# Patient Record
Sex: Female | Born: 1955
Health system: Southern US, Community
[De-identification: ages and names within clinical notes are randomized; demographics above are authoritative.]

## PROBLEM LIST (undated history)

## (undated) DIAGNOSIS — E785 Hyperlipidemia, unspecified: Secondary | ICD-10-CM

## (undated) DIAGNOSIS — D649 Anemia, unspecified: Secondary | ICD-10-CM

## (undated) DIAGNOSIS — K219 Gastro-esophageal reflux disease without esophagitis: Secondary | ICD-10-CM

## (undated) DIAGNOSIS — D72819 Decreased white blood cell count, unspecified: Secondary | ICD-10-CM

## (undated) DIAGNOSIS — Z8719 Personal history of other diseases of the digestive system: Secondary | ICD-10-CM

## (undated) DIAGNOSIS — B029 Zoster without complications: Secondary | ICD-10-CM

## (undated) DIAGNOSIS — K589 Irritable bowel syndrome without diarrhea: Secondary | ICD-10-CM

## (undated) DIAGNOSIS — Z8711 Personal history of peptic ulcer disease: Secondary | ICD-10-CM

## (undated) DIAGNOSIS — T7840XA Allergy, unspecified, initial encounter: Secondary | ICD-10-CM

## (undated) DIAGNOSIS — I509 Heart failure, unspecified: Secondary | ICD-10-CM

## (undated) DIAGNOSIS — G500 Trigeminal neuralgia: Secondary | ICD-10-CM

## (undated) DIAGNOSIS — I10 Essential (primary) hypertension: Secondary | ICD-10-CM

## (undated) DIAGNOSIS — I2510 Atherosclerotic heart disease of native coronary artery without angina pectoris: Secondary | ICD-10-CM

## (undated) HISTORY — DX: Hyperlipidemia, unspecified: E78.5

## (undated) HISTORY — DX: Decreased white blood cell count, unspecified: D72.819

## (undated) HISTORY — DX: Heart failure, unspecified: I50.9

## (undated) HISTORY — DX: Essential (primary) hypertension: I10

## (undated) HISTORY — DX: Anemia, unspecified: D64.9

## (undated) HISTORY — DX: Gastro-esophageal reflux disease without esophagitis: K21.9

## (undated) HISTORY — DX: Allergy, unspecified, initial encounter: T78.40XA

## (undated) HISTORY — DX: Irritable bowel syndrome, unspecified: K58.9

## (undated) HISTORY — DX: Personal history of peptic ulcer disease: Z87.11

## (undated) HISTORY — DX: Personal history of other diseases of the digestive system: Z87.19

## (undated) HISTORY — PX: LUMBAR SPINE SURGERY: SHX701

## (undated) HISTORY — PX: TUBAL LIGATION: SHX77

## (undated) HISTORY — DX: Atherosclerotic heart disease of native coronary artery without angina pectoris: I25.10

## (undated) HISTORY — DX: Zoster without complications: B02.9

## (undated) HISTORY — DX: Trigeminal neuralgia: G50.0

## (undated) HISTORY — PX: TONSILLECTOMY: SUR1361

---

## 1998-11-02 ENCOUNTER — Other Ambulatory Visit: Admission: RE | Admit: 1998-11-02 | Discharge: 1998-11-02 | Payer: Self-pay | Admitting: Obstetrics and Gynecology

## 1999-01-19 ENCOUNTER — Ambulatory Visit (HOSPITAL_COMMUNITY): Admission: RE | Admit: 1999-01-19 | Discharge: 1999-01-19 | Payer: Self-pay | Admitting: Oncology

## 1999-01-19 ENCOUNTER — Encounter (INDEPENDENT_AMBULATORY_CARE_PROVIDER_SITE_OTHER): Payer: Self-pay | Admitting: *Deleted

## 1999-03-25 ENCOUNTER — Encounter: Admission: RE | Admit: 1999-03-25 | Discharge: 1999-03-25 | Payer: Self-pay | Admitting: Family Medicine

## 1999-03-25 ENCOUNTER — Encounter: Payer: Self-pay | Admitting: Family Medicine

## 1999-05-11 ENCOUNTER — Encounter: Payer: Self-pay | Admitting: Obstetrics and Gynecology

## 1999-05-11 ENCOUNTER — Ambulatory Visit (HOSPITAL_COMMUNITY): Admission: RE | Admit: 1999-05-11 | Discharge: 1999-05-11 | Payer: Self-pay | Admitting: Obstetrics and Gynecology

## 2000-01-11 HISTORY — PX: ABDOMINAL HYSTERECTOMY: SHX81

## 2000-01-11 HISTORY — PX: CORONARY ARTERY BYPASS GRAFT: SHX141

## 2000-02-17 ENCOUNTER — Other Ambulatory Visit: Admission: RE | Admit: 2000-02-17 | Discharge: 2000-02-17 | Payer: Self-pay | Admitting: Obstetrics and Gynecology

## 2000-02-22 ENCOUNTER — Encounter: Admission: RE | Admit: 2000-02-22 | Discharge: 2000-02-22 | Payer: Self-pay | Admitting: Obstetrics and Gynecology

## 2000-02-22 ENCOUNTER — Encounter: Payer: Self-pay | Admitting: Obstetrics and Gynecology

## 2000-05-08 ENCOUNTER — Encounter: Payer: Self-pay | Admitting: Obstetrics and Gynecology

## 2000-05-09 ENCOUNTER — Inpatient Hospital Stay (HOSPITAL_COMMUNITY): Admission: RE | Admit: 2000-05-09 | Discharge: 2000-05-10 | Payer: Self-pay | Admitting: Obstetrics and Gynecology

## 2000-05-09 ENCOUNTER — Encounter (INDEPENDENT_AMBULATORY_CARE_PROVIDER_SITE_OTHER): Payer: Self-pay

## 2000-08-21 ENCOUNTER — Encounter: Payer: Self-pay | Admitting: Family Medicine

## 2000-08-21 ENCOUNTER — Encounter: Admission: RE | Admit: 2000-08-21 | Discharge: 2000-08-21 | Payer: Self-pay | Admitting: Family Medicine

## 2000-11-20 ENCOUNTER — Emergency Department (HOSPITAL_COMMUNITY): Admission: EM | Admit: 2000-11-20 | Discharge: 2000-11-21 | Payer: Self-pay | Admitting: Emergency Medicine

## 2000-11-21 ENCOUNTER — Encounter: Payer: Self-pay | Admitting: Emergency Medicine

## 2000-11-29 ENCOUNTER — Encounter: Payer: Self-pay | Admitting: Emergency Medicine

## 2000-11-29 ENCOUNTER — Inpatient Hospital Stay (HOSPITAL_COMMUNITY): Admission: EM | Admit: 2000-11-29 | Discharge: 2000-12-06 | Payer: Self-pay | Admitting: Emergency Medicine

## 2000-12-01 ENCOUNTER — Encounter: Payer: Self-pay | Admitting: Thoracic Surgery (Cardiothoracic Vascular Surgery)

## 2000-12-02 ENCOUNTER — Encounter: Payer: Self-pay | Admitting: Surgery

## 2000-12-03 ENCOUNTER — Encounter: Payer: Self-pay | Admitting: Cardiothoracic Surgery

## 2000-12-04 ENCOUNTER — Encounter: Payer: Self-pay | Admitting: Cardiothoracic Surgery

## 2001-01-16 ENCOUNTER — Encounter (HOSPITAL_COMMUNITY): Admission: RE | Admit: 2001-01-16 | Discharge: 2001-02-05 | Payer: Self-pay | Admitting: Family Medicine

## 2001-02-12 ENCOUNTER — Inpatient Hospital Stay (HOSPITAL_COMMUNITY): Admission: EM | Admit: 2001-02-12 | Discharge: 2001-02-12 | Payer: Self-pay | Admitting: Emergency Medicine

## 2001-02-12 ENCOUNTER — Encounter: Payer: Self-pay | Admitting: Cardiology

## 2001-05-02 ENCOUNTER — Other Ambulatory Visit: Admission: RE | Admit: 2001-05-02 | Discharge: 2001-05-02 | Payer: Self-pay | Admitting: Obstetrics and Gynecology

## 2001-08-24 ENCOUNTER — Ambulatory Visit (HOSPITAL_COMMUNITY): Admission: RE | Admit: 2001-08-24 | Discharge: 2001-08-25 | Payer: Self-pay | Admitting: Cardiology

## 2002-05-16 ENCOUNTER — Encounter: Payer: Self-pay | Admitting: Family Medicine

## 2002-05-16 ENCOUNTER — Other Ambulatory Visit: Admission: RE | Admit: 2002-05-16 | Discharge: 2002-05-16 | Payer: Self-pay | Admitting: Obstetrics and Gynecology

## 2002-09-25 ENCOUNTER — Encounter: Admission: RE | Admit: 2002-09-25 | Discharge: 2002-09-25 | Payer: Self-pay | Admitting: Family Medicine

## 2002-09-25 ENCOUNTER — Encounter: Payer: Self-pay | Admitting: Family Medicine

## 2002-10-22 ENCOUNTER — Encounter: Admission: RE | Admit: 2002-10-22 | Discharge: 2002-10-22 | Payer: Self-pay | Admitting: Orthopedic Surgery

## 2002-10-22 ENCOUNTER — Encounter: Payer: Self-pay | Admitting: Orthopedic Surgery

## 2003-10-16 ENCOUNTER — Emergency Department (HOSPITAL_COMMUNITY): Admission: EM | Admit: 2003-10-16 | Discharge: 2003-10-16 | Payer: Self-pay | Admitting: *Deleted

## 2003-10-17 ENCOUNTER — Ambulatory Visit (HOSPITAL_COMMUNITY): Admission: RE | Admit: 2003-10-17 | Discharge: 2003-10-17 | Payer: Self-pay | Admitting: Gastroenterology

## 2003-12-10 ENCOUNTER — Encounter: Admission: RE | Admit: 2003-12-10 | Discharge: 2003-12-10 | Payer: Self-pay | Admitting: Gastroenterology

## 2004-11-18 ENCOUNTER — Encounter: Admission: RE | Admit: 2004-11-18 | Discharge: 2004-11-18 | Payer: Self-pay | Admitting: Obstetrics and Gynecology

## 2004-11-18 ENCOUNTER — Encounter: Payer: Self-pay | Admitting: Family Medicine

## 2004-11-18 LAB — HM DEXA SCAN

## 2005-05-20 ENCOUNTER — Ambulatory Visit (HOSPITAL_COMMUNITY): Admission: RE | Admit: 2005-05-20 | Discharge: 2005-05-20 | Payer: Self-pay | Admitting: Cardiology

## 2005-05-20 ENCOUNTER — Encounter: Payer: Self-pay | Admitting: Vascular Surgery

## 2005-07-07 ENCOUNTER — Encounter: Admission: RE | Admit: 2005-07-07 | Discharge: 2005-07-07 | Payer: Self-pay | Admitting: Endocrinology

## 2005-11-11 ENCOUNTER — Encounter: Admission: RE | Admit: 2005-11-11 | Discharge: 2005-11-11 | Payer: Self-pay | Admitting: Nephrology

## 2005-11-30 ENCOUNTER — Encounter: Payer: Self-pay | Admitting: Family Medicine

## 2005-11-30 ENCOUNTER — Ambulatory Visit (HOSPITAL_COMMUNITY): Admission: RE | Admit: 2005-11-30 | Discharge: 2005-11-30 | Payer: Self-pay | Admitting: Gastroenterology

## 2005-11-30 ENCOUNTER — Encounter (INDEPENDENT_AMBULATORY_CARE_PROVIDER_SITE_OTHER): Payer: Self-pay | Admitting: Specialist

## 2006-02-08 ENCOUNTER — Encounter: Payer: Self-pay | Admitting: Family Medicine

## 2006-06-02 ENCOUNTER — Encounter: Admission: RE | Admit: 2006-06-02 | Discharge: 2006-06-02 | Payer: Self-pay | Admitting: Family Medicine

## 2006-09-10 ENCOUNTER — Encounter: Payer: Self-pay | Admitting: Family Medicine

## 2006-09-10 ENCOUNTER — Encounter: Admission: RE | Admit: 2006-09-10 | Discharge: 2006-09-10 | Payer: Self-pay | Admitting: Family Medicine

## 2007-01-11 LAB — HM COLONOSCOPY

## 2007-02-06 ENCOUNTER — Encounter: Admission: RE | Admit: 2007-02-06 | Discharge: 2007-02-06 | Payer: Self-pay | Admitting: Family Medicine

## 2007-06-08 ENCOUNTER — Encounter: Admission: RE | Admit: 2007-06-08 | Discharge: 2007-06-08 | Payer: Self-pay | Admitting: Cardiology

## 2007-06-11 ENCOUNTER — Encounter: Admission: RE | Admit: 2007-06-11 | Discharge: 2007-06-11 | Payer: Self-pay | Admitting: Cardiology

## 2007-06-21 ENCOUNTER — Encounter: Payer: Self-pay | Admitting: Family Medicine

## 2007-07-06 ENCOUNTER — Ambulatory Visit (HOSPITAL_COMMUNITY): Admission: RE | Admit: 2007-07-06 | Discharge: 2007-07-06 | Payer: Self-pay | Admitting: Gastroenterology

## 2007-07-11 ENCOUNTER — Encounter: Admission: RE | Admit: 2007-07-11 | Discharge: 2007-07-11 | Payer: Self-pay | Admitting: Gastroenterology

## 2008-03-19 ENCOUNTER — Encounter: Payer: Self-pay | Admitting: Family Medicine

## 2008-06-19 ENCOUNTER — Encounter: Payer: Self-pay | Admitting: Family Medicine

## 2008-06-19 LAB — CONVERTED CEMR LAB
Basophils Absolute: 0 10*3/uL
Basophils Relative: 1 %
Eosinophils Absolute: 0 10*3/uL
Eosinophils Relative: 1 %
Ferritin: 29 ng/mL
HCT: 32.1 %
Hemoglobin: 10.7 g/dL
Iron: 84 ug/dL
Lymphocytes Relative: 63 %
Lymphs Abs: 2 10*3/uL
MCHC: 33.3 g/dL
MCV: 89 fL
Monocytes Absolute: 0.3 10*3/uL
Monocytes Relative: 10 %
Neutro Abs: 0.8 10*3/uL
Neutrophils Relative %: 25 %
Platelets: 201 10*3/uL
RBC: 3.59 M/uL
RDW: 14.5 %
Saturation Ratios: 20 %
TIBC: 412 ug/dL
UIBC: 328 ug/dL
WBC: 3.2 10*3/uL

## 2008-09-11 ENCOUNTER — Encounter: Payer: Self-pay | Admitting: Family Medicine

## 2008-09-11 HISTORY — PX: OTHER SURGICAL HISTORY: SHX169

## 2008-09-18 LAB — HM MAMMOGRAPHY

## 2008-09-24 ENCOUNTER — Encounter: Admission: RE | Admit: 2008-09-24 | Discharge: 2008-09-24 | Payer: Self-pay | Admitting: Obstetrics and Gynecology

## 2008-09-24 ENCOUNTER — Encounter: Payer: Self-pay | Admitting: Family Medicine

## 2008-12-19 ENCOUNTER — Encounter: Payer: Self-pay | Admitting: Family Medicine

## 2008-12-19 ENCOUNTER — Inpatient Hospital Stay (HOSPITAL_COMMUNITY): Admission: EM | Admit: 2008-12-19 | Discharge: 2008-12-21 | Payer: Self-pay | Admitting: Emergency Medicine

## 2009-03-13 ENCOUNTER — Encounter: Payer: Self-pay | Admitting: Family Medicine

## 2009-03-13 LAB — CONVERTED CEMR LAB
ALT: 23 units/L
AST: 28 units/L
Albumin: 4.7 g/dL
Alkaline Phosphatase: 90 units/L
BUN: 16 mg/dL
Basophils Absolute: 0 10*3/uL
Basophils Relative: 0 %
CO2: 26 meq/L
Calcium: 9.7 mg/dL
Chloride: 102 meq/L
Cholesterol: 139 mg/dL
Creatinine, Ser: 1.01 mg/dL
Eosinophils Absolute: 0 10*3/uL
Eosinophils Relative: 1 %
Glucose, Bld: 89 mg/dL
HCT: 31.4 %
HDL: 57 mg/dL
Hemoglobin: 10.4 g/dL
LDL Cholesterol: 67 mg/dL
Lymphocytes Relative: 44 %
Lymphs Abs: 1.1 10*3/uL
MCHC: 33.1 g/dL
MCV: 86 fL
Monocytes Absolute: 0.3 10*3/uL
Monocytes Relative: 11 %
Neutro Abs: 1.1 10*3/uL
Neutrophils Relative %: 44 %
Platelets: 165 10*3/uL
Potassium: 4.1 meq/L
RBC: 3.64 M/uL
RDW: 14.2 %
Sodium: 143 meq/L
TSH: 2.66 microintl units/mL
Total Bilirubin: 0.4 mg/dL
Total Protein: 6.9 g/dL
Triglycerides: 76 mg/dL
VLDL: 15 mg/dL
Vit D, 25-Hydroxy: 12.5 ng/mL
WBC: 2.5 10*3/uL

## 2009-05-04 ENCOUNTER — Encounter: Payer: Self-pay | Admitting: Family Medicine

## 2009-07-09 ENCOUNTER — Ambulatory Visit: Payer: Self-pay | Admitting: Oncology

## 2009-07-16 ENCOUNTER — Encounter: Payer: Self-pay | Admitting: Family Medicine

## 2009-09-28 ENCOUNTER — Encounter: Payer: Self-pay | Admitting: Family Medicine

## 2009-09-28 LAB — CONVERTED CEMR LAB

## 2009-09-29 ENCOUNTER — Encounter: Payer: Self-pay | Admitting: Family Medicine

## 2009-10-02 ENCOUNTER — Ambulatory Visit: Payer: Self-pay | Admitting: Oncology

## 2009-10-06 LAB — CBC WITH DIFFERENTIAL/PLATELET
BASO%: 0.9 % (ref 0.0–2.0)
Basophils Absolute: 0 10*3/uL (ref 0.0–0.1)
EOS%: 0.7 % (ref 0.0–7.0)
Eosinophils Absolute: 0 10*3/uL (ref 0.0–0.5)
HCT: 30.7 % — ABNORMAL LOW (ref 34.8–46.6)
HGB: 11 g/dL — ABNORMAL LOW (ref 11.6–15.9)
LYMPH%: 54.1 % — ABNORMAL HIGH (ref 14.0–49.7)
MCH: 31.8 pg (ref 25.1–34.0)
MCHC: 36 g/dL (ref 31.5–36.0)
MCV: 88.3 fL (ref 79.5–101.0)
MONO#: 0.2 10*3/uL (ref 0.1–0.9)
MONO%: 9.3 % (ref 0.0–14.0)
NEUT#: 0.9 10*3/uL — ABNORMAL LOW (ref 1.5–6.5)
NEUT%: 35 % — ABNORMAL LOW (ref 38.4–76.8)
Platelets: 175 10*3/uL (ref 145–400)
RBC: 3.48 10*6/uL — ABNORMAL LOW (ref 3.70–5.45)
RDW: 14 % (ref 11.2–14.5)
WBC: 2.6 10*3/uL — ABNORMAL LOW (ref 3.9–10.3)
lymph#: 1.4 10*3/uL (ref 0.9–3.3)

## 2009-10-06 LAB — MORPHOLOGY
PLT EST: ADEQUATE
RBC Comments: NORMAL

## 2009-10-06 LAB — CHCC SMEAR

## 2009-10-09 LAB — ANA: Anti Nuclear Antibody(ANA): NEGATIVE

## 2009-10-09 LAB — SEDIMENTATION RATE: Sed Rate: 18 mm/hr (ref 0–22)

## 2009-10-09 LAB — CMV IGM: CMV IgM: 0.59 (ref <0.90)

## 2009-10-09 LAB — CYTOMEGALOVIRUS ANTIBODY, IGG: Cytomegalovirus Ab-IgG: >6 — ABNORMAL HIGH (ref <0.90)

## 2009-11-20 ENCOUNTER — Encounter: Payer: Self-pay | Admitting: Family Medicine

## 2010-01-12 ENCOUNTER — Ambulatory Visit
Admission: RE | Admit: 2010-01-12 | Discharge: 2010-01-12 | Payer: Self-pay | Source: Home / Self Care | Attending: Family Medicine | Admitting: Family Medicine

## 2010-01-12 DIAGNOSIS — K589 Irritable bowel syndrome without diarrhea: Secondary | ICD-10-CM | POA: Insufficient documentation

## 2010-01-12 DIAGNOSIS — I2510 Atherosclerotic heart disease of native coronary artery without angina pectoris: Secondary | ICD-10-CM | POA: Insufficient documentation

## 2010-01-12 DIAGNOSIS — E785 Hyperlipidemia, unspecified: Secondary | ICD-10-CM | POA: Insufficient documentation

## 2010-01-13 DIAGNOSIS — G43909 Migraine, unspecified, not intractable, without status migrainosus: Secondary | ICD-10-CM | POA: Insufficient documentation

## 2010-01-13 DIAGNOSIS — I119 Hypertensive heart disease without heart failure: Secondary | ICD-10-CM | POA: Insufficient documentation

## 2010-01-20 ENCOUNTER — Encounter: Payer: Self-pay | Admitting: Family Medicine

## 2010-01-21 ENCOUNTER — Encounter: Payer: Self-pay | Admitting: Family Medicine

## 2010-01-27 ENCOUNTER — Telehealth: Payer: Self-pay | Admitting: Family Medicine

## 2010-01-27 ENCOUNTER — Encounter: Payer: Self-pay | Admitting: Family Medicine

## 2010-01-31 ENCOUNTER — Encounter: Payer: Self-pay | Admitting: Family Medicine

## 2010-02-07 DIAGNOSIS — M810 Age-related osteoporosis without current pathological fracture: Secondary | ICD-10-CM | POA: Insufficient documentation

## 2010-02-11 NOTE — Letter (Signed)
Summary: Family Medicine at [facility] Medicine at [facility]   Imported By: Maryln Gottron 02/02/2010 10:03:21  _____________________________________________________________________  External Attachment:    Type:   Image     Comment:   External Document

## 2010-02-11 NOTE — Miscellaneous (Signed)
Clinical Lists Changes  Observations: Added new observation of PAST MED HX: Current Problems:  MIGRAINE UNSP W/O INTRACT W/O STATUS MIGRAINOSUS (ICD-346.90) IBS (ICD-564.1) HYPERTENSION (ICD-401.9) HYPERLIPIDEMIA (ICD-272.4) C A D (ICD-414.00)- Dr. Donnie Aho with cards 09/11/2008  Probably normal stress Cardiolite with breast attenuation noted but no  evidence for ischemia 11/18/2004   Bone Density  T-score Lumbar Spine (-3.4) T-Score Left Hip (-1.8) (01/27/2010 12:09) Added new observation of VIT D 25-OH: 12.5 ng/mL (03/13/2009 12:09) Added new observation of ABSOLUTE BAS: 0.0 K/uL (03/13/2009 12:09) Added new observation of BASOPHIL %: 0 % (03/13/2009 12:09) Added new observation of EOS ABSLT: 0.0 K/uL (03/13/2009 12:09) Added new observation of % EOS AUTO: 1 % (03/13/2009 12:09) Added new observation of ABSOLUTE MON: 0.3 K/uL (03/13/2009 12:09) Added new observation of MONOCYTE %: 11 % (03/13/2009 12:09) Added new observation of ABS LYMPHOCY: 1.1 K/uL (03/13/2009 12:09) Added new observation of LYMPHS %: 44 % (03/13/2009 12:09) Added new observation of ABS NEUTROPH: 1.1 K/uL (03/13/2009 12:09) Added new observation of PMN %: 44 % (03/13/2009 12:09) Added new observation of PLATELETK/UL: 165 K/uL (03/13/2009 12:09) Added new observation of RDW: 14.2 % (03/13/2009 12:09) Added new observation of MCHC RBC: 33.1 g/dL (16/10/9602 54:09) Added new observation of MCV: 86 fL (03/13/2009 12:09) Added new observation of HCT: 31.4 % (03/13/2009 12:09) Added new observation of HGB: 10.4 g/dL (81/19/1478 29:56) Added new observation of RBC M/UL: 3.64 M/uL (03/13/2009 12:09) Added new observation of WBC COUNT: 2.5 10*3/microliter (03/13/2009 12:09) Added new observation of TSH: 2.660 microintl units/mL (03/13/2009 12:09) Added new observation of CALCIUM: 9.7 mg/dL (21/30/8657 84:69) Added new observation of ALBUMIN: 4.7 g/dL (62/95/2841 32:44) Added new observation of PROTEIN, TOT: 6.9 g/dL  (01/12/7251 66:44) Added new observation of SGPT (ALT): 23 units/L (03/13/2009 12:09) Added new observation of SGOT (AST): 28 units/L (03/13/2009 12:09) Added new observation of ALK PHOS: 90 units/L (03/13/2009 12:09) Added new observation of BILI TOTAL: 0.4 mg/dL (03/47/4259 56:38) Added new observation of CREATININE: 1.01 mg/dL (75/64/3329 51:88) Added new observation of BUN: 16 mg/dL (41/66/0630 16:01) Added new observation of BG RANDOM: 89 mg/dL (09/32/3557 32:20) Added new observation of CO2 PLSM/SER: 26 meq/L (03/13/2009 12:09) Added new observation of CL SERUM: 102 meq/L (03/13/2009 12:09) Added new observation of K SERUM: 4.1 meq/L (03/13/2009 12:09) Added new observation of NA: 143 meq/L (03/13/2009 12:09) Added new observation of VLDL: 15 mg/dL (25/42/7062 37:62) Added new observation of LDL: 67 mg/dL (83/15/1761 60:73) Added new observation of HDL: 57 mg/dL (71/06/2692 85:46) Added new observation of TRIGLYC TOT: 76 mg/dL (27/03/5007 38:18) Added new observation of CHOLESTEROL: 139 mg/dL (29/93/7169 67:89) Added new observation of ABSOLUTE BAS: 0.0 K/uL (06/19/2008 12:09) Added new observation of BASOPHIL %: 1 % (06/19/2008 12:09) Added new observation of EOS ABSLT: 0.0 K/uL (06/19/2008 12:09) Added new observation of % EOS AUTO: 1 % (06/19/2008 12:09) Added new observation of ABSOLUTE MON: 0.3 K/uL (06/19/2008 12:09) Added new observation of MONOCYTE %: 10 % (06/19/2008 12:09) Added new observation of ABS LYMPHOCY: 2.0 K/uL (06/19/2008 12:09) Added new observation of LYMPHS %: 63 % (06/19/2008 12:09) Added new observation of ABS NEUTROPH: 0.8 K/uL (06/19/2008 12:09) Added new observation of PMN %: 25 % (06/19/2008 12:09) Added new observation of PLATELETK/UL: 201 K/uL (06/19/2008 12:09) Added new observation of RDW: 14.5 % (06/19/2008 12:09) Added new observation of MCHC RBC: 33.3 g/dL (38/10/1749 02:58) Added new observation of MCV: 89 fL (06/19/2008 12:09) Added new  observation of HCT: 32.1 % (06/19/2008 12:09) Added new observation of  HGB: 10.7 g/dL (16/10/9602 54:09) Added new observation of RBC M/UL: 3.59 M/uL (06/19/2008 12:09) Added new observation of WBC COUNT: 3.2 10*3/microliter (06/19/2008 12:09) Added new observation of FERRITIN: 29 ng/mL (06/19/2008 12:09) Added new observation of IRON SATUR %: 20 % (06/19/2008 12:09) Added new observation of TIBC: 412 mcg/dL (81/19/1478 29:56) Added new observation of UIBC: 328 mcg/dL (21/30/8657 84:69) Added new observation of IRON: 84 mcg/dL (62/95/2841 32:44)      Past History:  Past Medical History: Current Problems:  MIGRAINE UNSP W/O INTRACT W/O STATUS MIGRAINOSUS (ICD-346.90) IBS (ICD-564.1) HYPERTENSION (ICD-401.9) HYPERLIPIDEMIA (ICD-272.4) C A D (ICD-414.00)- Dr. Donnie Aho with cards 09/11/2008  Probably normal stress Cardiolite with breast attenuation noted but no  evidence for ischemia 11/18/2004   Bone Density  T-score Lumbar Spine (-3.4) T-Score Left Hip (-1.8)

## 2010-02-11 NOTE — Miscellaneous (Signed)
  Clinical Lists Changes  Allergies: Added new allergy or adverse reaction of BONIVA Added new allergy or adverse reaction of * RECLAST Added new allergy or adverse reaction of SULFA Observations: Added new observation of ALLERGY REV: Done (01/21/2010 0:01)       Allergies: 1)  ! Augmentin 2)  ! Boniva 3)  ! Sulfa 4)  * Reclast

## 2010-02-11 NOTE — Letter (Signed)
Summary: Family Medicine at [facility] Medicine at [facility]   Imported By: Maryln Gottron 02/02/2010 10:01:35  _____________________________________________________________________  External Attachment:    Type:   Image     Comment:   External Document

## 2010-02-11 NOTE — Letter (Signed)
Summary: Viann Fish MD Cardiology  Viann Fish MD Cardiology   Imported By: Lanelle Bal 01/20/2010 12:03:31  _____________________________________________________________________  External Attachment:    Type:   Image     Comment:   External Document

## 2010-02-11 NOTE — Miscellaneous (Signed)
  Clinical Lists Changes  Observations: Added new observation of PAST MED HX: Current Problems:  MIGRAINE UNSP W/O INTRACT W/O STATUS MIGRAINOSUS (ICD-346.90) IBS (ICD-564.1) HYPERTENSION (ICD-401.9) HYPERLIPIDEMIA (ICD-272.4) C A D (ICD-414.00)- Dr. Donnie Aho with cards 09/11/2008  Probably normal stress Cardiolite with breast attenuation noted but no  evidence for ischemia (01/20/2010 11:23)      Past History:  Past Medical History: Current Problems:  MIGRAINE UNSP W/O INTRACT W/O STATUS MIGRAINOSUS (ICD-346.90) IBS (ICD-564.1) HYPERTENSION (ICD-401.9) HYPERLIPIDEMIA (ICD-272.4) C A D (ICD-414.00)- Dr. Donnie Aho with cards 09/11/2008  Probably normal stress Cardiolite with breast attenuation noted but no  evidence for ischemia

## 2010-02-11 NOTE — Letter (Signed)
Summary: Family Medicine at [facility] Medicine at [facility]   Imported By: Maryln Gottron 02/02/2010 10:08:16  _____________________________________________________________________  External Attachment:    Type:   Image     Comment:   External Document

## 2010-02-11 NOTE — Miscellaneous (Signed)
  Clinical Lists Changes  Problems: Added new problem of FAMILY HISTORY DIABETES 1ST DEGREE RELATIVE (ICD-V18.0) Observations: Added new observation of LAST MP: 2002  (01/20/2010 9:22) Added new observation of REGULAREXERC: yes  (01/20/2010 9:22) Added new observation of DRUG USE: no  (01/20/2010 9:22) Added new observation of SOCIAL HX: From [place] 1 son divorced works for [employer]/eligibility and part time at [employer] Education:  14 years no tob no alcohol enjoys time at [employer] and jazz music Drug use-no Regular exercise-yes   (01/20/2010 9:22) Added new observation of FAMILY HX: M alive with arthritis F dead with MI, CVA at 79 Family History Diabetes 1st degree relative, parents Family History Hypertension, parents Family History of Stroke F 1st degree relative <60, parents Family History of Heart Disease, parents  (01/20/2010 9:22) Added new observation of FH STROKE: Family History of Stroke F 1st degree relative <60  (01/20/2010 9:22) Added new observation of FH HTN: Family History Hypertension  (01/20/2010 9:22) Added new observation of FH DIABETES: Family History Diabetes 1st degree relative  (01/20/2010 9:22) Added new observation of FLU VAX: given  (10/10/2009 9:28) Added new observation of PAP SMEAR: Done  (09/28/2009 9:30) Added new observation of MAMMOGRAM: Done  (09/18/2008 9:28) Added new observation of TD BOOSTER: Td  (01/11/2008 9:28) Added new observation of COLONOSCOPY: Done  (01/11/2007 9:28)       Family History: M alive with arthritis F dead with MI, CVA at 58 Family History Diabetes 1st degree relative, parents Family History Hypertension, parents Family History of Stroke F 1st degree relative <60, parents Family History of Heart Disease, parents  Social History: From [place] 1 son divorced works for [employer]/eligibility and part time at [employer] Education:  14 years no tob no alcohol enjoys time at [employer] and jazz  music Drug use-no Regular exercise-yes Drug Use:  no Does Patient Exercise:  yes    Preventive Care Screening  Last Flu Shot:    Date:  10/10/2009    Results:  given   Pap Smear:    Date:  09/28/2009    Results:  Done   Mammogram:    Date:  09/18/2008    Results:  Done   Last Tetanus Booster:    Date:  01/11/2008    Results:  Td   Colonoscopy:    Date:  01/11/2007    Results:  Done     Vital Signs:  Patient Profile:   55 Years Old Female LMP:     2002  Menstrual History: LMP (date): 2002             Last PAP Date 09/28/2009

## 2010-02-11 NOTE — Assessment & Plan Note (Signed)
Summary: NEW PT TO EST/CLE   Vital Signs:  Patient profile:   55 year old female Height:      62.5 inches Weight:      133.75 pounds BMI:     24.16 Temp:     98 degrees F oral Pulse rate:   84 / minute Pulse rhythm:   regular BP sitting:   132 / 90  (left arm) Cuff size:   regular  Vitals Entered By: Delilah Shan CMA Duncan Dull) (January 12, 2010 10:17 AM) CC: New Patient to Establish   History of Present Illness: New patient.  Feeling well w/o complaints.    Hypertension:      Using medication without problems or lightheadedness: yes Chest pain with exertion:no Edema:no Short of breath:no Other issues:no  Elevated Cholesterol: Using medications without problems:yes Muscle aches: no Other complaints: no  CAD s/p CABG w/o MI.  followed by cards with good exertional capacity per patient and  no CP.  Requesting records.    Allergies (verified): 1)  ! Augmentin  Past History:  Family History: Last updated: 01/12/2010 M alive with arthritis F dead with MI, CVA at 35  Social History: Last updated: 01/12/2010 From [place] 1 son divorced works for [employer]/eligibility and part time at [employer] no tob no alcohol enjoys time at [employer] and jazz music  Past Medical History: Current Problems:  MIGRAINE UNSP W/O INTRACT W/O STATUS MIGRAINOSUS (ICD-346.90) IBS (ICD-564.1) HYPERTENSION (ICD-401.9) HYPERLIPIDEMIA (ICD-272.4) C A D (ICD-414.00)- Dr. Donnie Aho with cards  Past Surgical History: Coronary artery bypass graft 2002 Hysterectomy 2002 Tonsillectomy  ~1965  Family History: M alive with arthritis F dead with MI, CVA at 49  Social History: From [place] 1 son divorced works for [employer]/eligibility and part time at [employer] no tob no alcohol enjoys time at [employer] and jazz music  Review of Systems       See HPI.  Otherwise negative.    Physical Exam  General:  GEN: nad, alert and oriented HEENT: mucous membranes moist NECK:  supple w/o LA CV: rrr PULM: ctab, no inc wob ABD: soft, +bs EXT: no edema SKIN: no acute rash    Impression & Recommendations:  Problem # 1:  HYPERTENSION, BENIGN ESSENTIAL (ICD-401.1) Controlled, requesting records.  Her updated medication list for this problem includes:    Diovan Hct 160-12.5 Mg Tabs (Valsartan-hydrochlorothiazide) .Marland Kitchen... Take 1 tablet by mouth once a day    Metoprolol Tartrate 50 Mg Tabs (Metoprolol tartrate) .Marland Kitchen... Take 1 tablet by mouth two times a day    Caduet 5-80 Mg Tabs (Amlodipine-atorvastatin) .Marland Kitchen... Take 1 tablet by mouth once a day  Problem # 2:  HYPERLIPIDEMIA (ICD-272.4) No change in meds, requesting records.  Her updated medication list for this problem includes:    Zetia 10 Mg Tabs (Ezetimibe) .Marland Kitchen... Take 1 tablet by mouth once a day    Caduet 5-80 Mg Tabs (Amlodipine-atorvastatin) .Marland Kitchen... Take 1 tablet by mouth once a day  Problem # 3:  C A D (ICD-414.00) No CP, requesting records.  Her updated medication list for this problem includes:    Diovan Hct 160-12.5 Mg Tabs (Valsartan-hydrochlorothiazide) .Marland Kitchen... Take 1 tablet by mouth once a day    Aspirin 81 Mg Tabs (Aspirin) .Marland Kitchen... Take 1 tablet by mouth once a day    Metoprolol Tartrate 50 Mg Tabs (Metoprolol tartrate) .Marland Kitchen... Take 1 tablet by mouth two times a day    Caduet 5-80 Mg Tabs (Amlodipine-atorvastatin) .Marland Kitchen... Take 1 tablet  by mouth once a day  Complete Medication List: 1)  Diovan Hct 160-12.5 Mg Tabs (Valsartan-hydrochlorothiazide) .... Take 1 tablet by mouth once a day 2)  Aspirin 81 Mg Tabs (Aspirin) .... Take 1 tablet by mouth once a day 3)  Metoprolol Tartrate 50 Mg Tabs (Metoprolol tartrate) .... Take 1 tablet by mouth two times a day 4)  Aciphex 20 Mg Tbec (Rabeprazole sodium) .... Take 1 tablet by mouth two times a day 5)  Dicyclomine Hcl 10 Mg Caps (Dicyclomine hcl) .... Take 1 tablet by mouth once a day 6)  Xyzal 5 Mg Tabs (Levocetirizine dihydrochloride) .... Take 1 tablet by mouth once a  day 7)  Zetia 10 Mg Tabs (Ezetimibe) .... Take 1 tablet by mouth once a day 8)  Caduet 5-80 Mg Tabs (Amlodipine-atorvastatin) .... Take 1 tablet by mouth once a day  Patient Instructions: 1)  We'll request your records.  Let me know if you have questions or conerns.  I'm glad to see you today.   2)  I would get a physical in the fall (October 2012).  Let me know if you want to do your labs here or with Donnie Aho.   3)  Take care.    Orders Added: 1)  New Patient Level III [16109]    Current Allergies (reviewed today): ! AUGMENTIN

## 2010-02-11 NOTE — Letter (Signed)
Summary: Family Medicine at [facility] Medicine at [facility]   Imported By: Maryln Gottron 02/02/2010 10:04:47  _____________________________________________________________________  External Attachment:    Type:   Image     Comment:   External Document

## 2010-02-17 NOTE — Progress Notes (Signed)
Summary: Bone Density Results/Evista  Phone Note Outgoing Call   Caller: Patient Call For: Crawford Givens MD Call placed by: Delilah Shan CMA Jermani Pund Dull),  January 27, 2010 12:26 PM Call placed to: Patient Summary of Call: I spoke to the patient about getting her last bone density test results and information concerning her Evista.  She says that Huel Cote (MD/GYN) should have these records and she will ask them to send them to [place].  Have you received them yet? Initial call taken by: Delilah Shan CMA Nathalya Wolanski Dull),  January 22, 2010 12:27 PM  Follow-up for Phone Call        Have you received these results yet?  Lugene Fuquay CMA Addalyne Vandehei Dull)  January 27, 2010 12:28 PM   not yet. Crawford Givens MD  January 27, 2010 12:32 PM  Patient reminded, she called Olegario Messier Richardson's office and they will fax over bone density test today. She says it was in 2010.  Lugene Fuquay CMA Angelino Rumery Dull)  February 02, 2010 12:07 PM    Patient says she has not ever been on Evista.  She was on Boniva a long time ago and it didn't suit her well.  Have you received the bone density results yet? Lugene Fuquay CMA Donalee Gaumond Dull)  February 04, 2010 4:19 PM   Additional Follow-up for Phone Call Additional follow up Details #1::        Not yet. Crawford Givens MD  February 04, 2010 6:22 PM.  Report in your in box.  Lugene Fuquay CMA Shmuel Girgis Dull)  February 05, 2010 3:33 PM    I will review. Crawford Givens MD  February 05, 2010 4:22 PM      Appended Document: Bone Density Results/Evista    Clinical Lists Changes  Problems: Added new problem of OSTEOPOROSIS (ICD-733.00) Observations: Added new observation of BONE DENSITY: T score -2.5 (09/24/2008 11:12)         Bone Density  Procedure date:  09/24/2008  Findings:      T score -2.5  Appended Document: Bone Density Results/Evista I called patient.  She isn't on evista and is intolerant of mult meds.  She has osteoporosis and is at increased risk if fx- she is aware of this.  At this  point, she is going to take tums since she can tolerate that.  She'll gradually increase her dose of vitamin D to limit side effects (hopefully) and we can recheck a DXA 10/12 since that will be 2 years since last scan.  We can talk about options at that point.  She agrees.   Clinical Lists Changes  Allergies: Added new allergy or adverse reaction of EVISTA Observations: Added new observation of ALLERGY REV: Done (02/08/2010 8:37)        Allergies: 1)  ! Augmentin 2)  ! Boniva 3)  ! Sulfa 4)  ! Evista 5)  * Reclast

## 2010-02-26 ENCOUNTER — Ambulatory Visit (INDEPENDENT_AMBULATORY_CARE_PROVIDER_SITE_OTHER): Payer: 59 | Admitting: Family Medicine

## 2010-02-26 ENCOUNTER — Encounter: Payer: Self-pay | Admitting: Family Medicine

## 2010-02-26 DIAGNOSIS — J019 Acute sinusitis, unspecified: Secondary | ICD-10-CM | POA: Insufficient documentation

## 2010-03-03 NOTE — Assessment & Plan Note (Signed)
Summary: HA,EAR IS ITCHING/CLE   [facility]   Vital Signs:  Patient profile:   55 year old female Height:      62.5 inches Weight:      136.25 pounds BMI:     24.61 Temp:     98.5 degrees F oral Pulse rate:   68 / minute Pulse rhythm:   regular BP sitting:   140 / 86  (left arm) Cuff size:   regular  Vitals Entered By: Delilah Shan CMA Manford Sprong Dull) (February 26, 2010 12:27 PM) CC: H/A, ear is itching   History of Present Illness: "I thought it was my sinuses with facial pressure initially."  L ear is irriated.  Now with popping in bilateral ears.  Using nasal saline two times a day, w/o relief.  No help with mucinex-this only keep her from sleeping. No help with other otc meds. Sx started about 2 weeks ago.  No fevers.    H/o osteoporosis.  She was intolerant of oral meds and not able to take enought calcium and vit d to use other meds- ie IV bisphosphonates.  we talked about this today.   Allergies: 1)  ! Augmentin 2)  ! Boniva 3)  ! Sulfa 4)  ! Evista 5)  * Reclast  Past History:  Past Medical History: Current Problems:  MIGRAINE UNSP W/O INTRACT W/O STATUS MIGRAINOSUS (ICD-346.90) IBS (ICD-564.1) HYPERTENSION (ICD-401.9) HYPERLIPIDEMIA (ICD-272.4) C A D (ICD-414.00)- Dr. Donnie Aho with cards 09/11/2008  Probably normal stress Cardiolite with breast attenuation noted but no  evidence for ischemia Osteoporosis 11/18/2004   Bone Density  T-score Lumbar Spine (-3.4) T-Score Left Hip (-1.8)- unable to tolerate meds for tx  Review of Systems       See HPI.  Otherwise negative.    Physical Exam  General:  GEN: nad, alert and oriented HEENT: mucous membranes moist, TM w/o erythema, L>R SOM  noted, nasal epithelium injected, OP with cobblestoning NECK: supple w/o LA CV: rrr. PULM: ctab, no inc wob EXT: no edema  max sinuses slightly tender to palpation    Impression & Recommendations:  Problem # 1:  SINUSITIS - ACUTE-NOS (ICD-461.9) with likely ETD dysfunction bilaterally.  Will  tx given symptoms, duration and SOM.  Start zmax, continue sinus rinse, and add on flonase . follow up as needed.  she agrees.   Her updated medication list for this problem includes:    Fluticasone Propionate 50 Mcg/act Susp (Fluticasone propionate) .Marland Kitchen... 2 sprays per nostril per day    Zithromax 250 Mg Tabs (Azithromycin) .Marland Kitchen... 2 by mouth today and then 1 by mouth  for four days  Orders: Prescription Created Electronically [phone])  Problem # 2:  OSTEOPOROSIS (ICD-733.00) she'll try to gradually increase her vit D/calcium intake .  Complete Medication List: 1)  Diovan Hct 160-12.5 Mg Tabs (Valsartan-hydrochlorothiazide) .... Take 1 tablet by mouth once a day 2)  Aspirin 81 Mg Tabs (Aspirin) .... Take 1 tablet by mouth once a day 3)  Metoprolol Tartrate 50 Mg Tabs (Metoprolol tartrate) .... Take 1 tablet by mouth two times a day 4)  Dicyclomine Hcl 10 Mg Caps (Dicyclomine hcl) .... Take 1 tablet by mouth once a day 5)  Xyzal 5 Mg Tabs (Levocetirizine dihydrochloride) .... Take 1 tablet by mouth once a day 6)  Zetia 10 Mg Tabs (Ezetimibe) .... Take 1 tablet by mouth once a day 7)  Caduet 5-80 Mg Tabs (Amlodipine-atorvastatin) .... Take 1 tablet by mouth once a day 8)  Protonix 20 Mg Tbec (  Pantoprazole sodium) .... ? mg.  take 1 tablet by mouth two times a day 9)  Fluticasone Propionate 50 Mcg/act Susp (Fluticasone propionate) .... 2 sprays per nostril per day 10)  Zithromax 250 Mg Tabs (Azithromycin) .... 2 by mouth today and then 1 by mouth  for four days  Patient Instructions: 1)  Start the antibiotics today and use the nose spray after the nasal rinse in the morning.  2)  Get plenty of rest, drink lots of clear liquids, and use Tylenol or Ibuprofen for fever and comfort. You can use a hot cloth on your face for pain.   3)  This should gradually get better.  Prescriptions: ZITHROMAX 250 MG TABS (AZITHROMYCIN) 2 by mouth today and then 1 by mouth  for four days  #6 x 0   Entered and  Authorized by:   Crawford Givens MD   Signed by:   Crawford Givens MD on 02/26/2010   Method used:   Electronically to        [facility] [address] [id number]* (retail)       [address] [place], [place]  [address]       Ph: [phone]       Fax: [phone]   RxID:   [id number] FLUTICASONE PROPIONATE 50 MCG/ACT SUSP (FLUTICASONE PROPIONATE) 2 sprays per nostril per day  #1 x 1   Entered and Authorized by:   Crawford Givens MD   Signed by:   Crawford Givens MD on 02/26/2010   Method used:   Electronically to        [facility] [address] [id number]* (retail)       [address] [place], [place]  [address]       Ph: [phone]       Fax: [phone]   RxID:   [id number]    Orders Added: 1)  Prescription Created Electronically [[id number]] 2)  Est. Patient Level III [71062]    Current Allergies (reviewed today): ! AUGMENTIN ! BONIVA ! SULFA ! EVISTA * RECLAST

## 2010-04-13 LAB — CK TOTAL AND CKMB (NOT AT ARMC)
CK, MB: 1.3 ng/mL (ref 0.3–4.0)
Relative Index: 0.8 (ref 0.0–2.5)
Total CK: 172 U/L (ref 7–177)

## 2010-04-13 LAB — CBC
HCT: 29.2 % — ABNORMAL LOW (ref 36.0–46.0)
HCT: 35.2 % — ABNORMAL LOW (ref 36.0–46.0)
Hemoglobin: 10.2 g/dL — ABNORMAL LOW (ref 12.0–15.0)
Hemoglobin: 12 g/dL (ref 12.0–15.0)
MCHC: 34.2 g/dL (ref 30.0–36.0)
MCHC: 34.8 g/dL (ref 30.0–36.0)
MCV: 90.1 fL (ref 78.0–100.0)
MCV: 91.6 fL (ref 78.0–100.0)
Platelets: 121 10*3/uL — ABNORMAL LOW (ref 150–400)
Platelets: 167 10*3/uL (ref 150–400)
RBC: 3.24 MIL/uL — ABNORMAL LOW (ref 3.87–5.11)
RBC: 3.84 MIL/uL — ABNORMAL LOW (ref 3.87–5.11)
RDW: 13.8 % (ref 11.5–15.5)
RDW: 14.3 % (ref 11.5–15.5)
WBC: 3.2 10*3/uL — ABNORMAL LOW (ref 4.0–10.5)
WBC: 6.6 10*3/uL (ref 4.0–10.5)

## 2010-04-13 LAB — URINALYSIS, ROUTINE W REFLEX MICROSCOPIC
Bilirubin Urine: NEGATIVE
Glucose, UA: NEGATIVE mg/dL
Hgb urine dipstick: NEGATIVE
Ketones, ur: NEGATIVE mg/dL
Nitrite: NEGATIVE
Protein, ur: NEGATIVE mg/dL
Specific Gravity, Urine: 1.03 (ref 1.005–1.030)
Urobilinogen, UA: 0.2 mg/dL (ref 0.0–1.0)
pH: 5 (ref 5.0–8.0)

## 2010-04-13 LAB — HEPATIC FUNCTION PANEL
ALT: 30 U/L (ref 0–35)
AST: 35 U/L (ref 0–37)
Albumin: 3.7 g/dL (ref 3.5–5.2)
Alkaline Phosphatase: 59 U/L (ref 39–117)
Bilirubin, Direct: 0.2 mg/dL (ref 0.0–0.3)
Indirect Bilirubin: 0.9 mg/dL (ref 0.3–0.9)
Total Bilirubin: 1.1 mg/dL (ref 0.3–1.2)
Total Protein: 6.1 g/dL (ref 6.0–8.3)

## 2010-04-13 LAB — DIFFERENTIAL
Basophils Absolute: 0 10*3/uL (ref 0.0–0.1)
Basophils Absolute: 0 10*3/uL (ref 0.0–0.1)
Basophils Relative: 0 % (ref 0–1)
Basophils Relative: 0 % (ref 0–1)
Eosinophils Absolute: 0 10*3/uL (ref 0.0–0.7)
Eosinophils Absolute: 0 10*3/uL (ref 0.0–0.7)
Eosinophils Relative: 0 % (ref 0–5)
Eosinophils Relative: 0 % (ref 0–5)
Lymphocytes Relative: 6 % — ABNORMAL LOW (ref 12–46)
Lymphocytes Relative: 9 % — ABNORMAL LOW (ref 12–46)
Lymphs Abs: 0.2 10*3/uL — ABNORMAL LOW (ref 0.7–4.0)
Lymphs Abs: 0.6 10*3/uL — ABNORMAL LOW (ref 0.7–4.0)
Monocytes Absolute: 0.1 10*3/uL (ref 0.1–1.0)
Monocytes Absolute: 0.3 10*3/uL (ref 0.1–1.0)
Monocytes Relative: 2 % — ABNORMAL LOW (ref 3–12)
Monocytes Relative: 5 % (ref 3–12)
Neutro Abs: 2.9 10*3/uL (ref 1.7–7.7)
Neutro Abs: 5.6 10*3/uL (ref 1.7–7.7)
Neutrophils Relative %: 86 % — ABNORMAL HIGH (ref 43–77)
Neutrophils Relative %: 92 % — ABNORMAL HIGH (ref 43–77)

## 2010-04-13 LAB — URINE CULTURE
Colony Count: 85000
Special Requests: NEGATIVE

## 2010-04-13 LAB — COMPREHENSIVE METABOLIC PANEL
ALT: 29 U/L (ref 0–35)
ALT: 37 U/L — ABNORMAL HIGH (ref 0–35)
AST: 43 U/L — ABNORMAL HIGH (ref 0–37)
AST: 43 U/L — ABNORMAL HIGH (ref 0–37)
Albumin: 3.3 g/dL — ABNORMAL LOW (ref 3.5–5.2)
Albumin: 4.6 g/dL (ref 3.5–5.2)
Alkaline Phosphatase: 62 U/L (ref 39–117)
Alkaline Phosphatase: 87 U/L (ref 39–117)
BUN: 11 mg/dL (ref 6–23)
BUN: 19 mg/dL (ref 6–23)
CO2: 21 mEq/L (ref 19–32)
CO2: 29 mEq/L (ref 19–32)
Calcium: 8.1 mg/dL — ABNORMAL LOW (ref 8.4–10.5)
Calcium: 9.9 mg/dL (ref 8.4–10.5)
Chloride: 103 mEq/L (ref 96–112)
Chloride: 108 mEq/L (ref 96–112)
Creatinine, Ser: 0.95 mg/dL (ref 0.4–1.2)
Creatinine, Ser: 1.04 mg/dL (ref 0.4–1.2)
GFR calc Af Amer: >60 mL/min (ref >60)
GFR calc Af Amer: >60 mL/min (ref >60)
GFR calc non Af Amer: 55 mL/min — ABNORMAL LOW (ref >60)
GFR calc non Af Amer: >60 mL/min (ref >60)
Glucose, Bld: 116 mg/dL — ABNORMAL HIGH (ref 70–99)
Glucose, Bld: 171 mg/dL — ABNORMAL HIGH (ref 70–99)
Potassium: 3.5 mEq/L (ref 3.5–5.1)
Potassium: 3.7 mEq/L (ref 3.5–5.1)
Sodium: 137 mEq/L (ref 135–145)
Sodium: 140 mEq/L (ref 135–145)
Total Bilirubin: 0.7 mg/dL (ref 0.3–1.2)
Total Bilirubin: 0.8 mg/dL (ref 0.3–1.2)
Total Protein: 6.2 g/dL (ref 6.0–8.3)
Total Protein: 7.7 g/dL (ref 6.0–8.3)

## 2010-04-13 LAB — POCT CARDIAC MARKERS
CKMB, poc: 1.5 ng/mL (ref 1.0–8.0)
CKMB, poc: <1 ng/mL — ABNORMAL LOW (ref 1.0–8.0)
CKMB, poc: <1 ng/mL — ABNORMAL LOW (ref 1.0–8.0)
Myoglobin, poc: 140 ng/mL (ref 12–200)
Myoglobin, poc: 167 ng/mL (ref 12–200)
Myoglobin, poc: 97.2 ng/mL (ref 12–200)
Troponin i, poc: <0.05 ng/mL (ref 0.00–0.09)
Troponin i, poc: <0.05 ng/mL (ref 0.00–0.09)
Troponin i, poc: <0.05 ng/mL (ref 0.00–0.09)

## 2010-04-13 LAB — CULTURE, BLOOD (ROUTINE X 2)
Culture: NO GROWTH
Culture: NO GROWTH

## 2010-04-13 LAB — LIPASE, BLOOD: Lipase: 38 U/L (ref 11–59)

## 2010-04-13 LAB — TROPONIN I: Troponin I: 0.02 ng/mL (ref 0.00–0.06)

## 2010-04-20 ENCOUNTER — Encounter: Payer: Self-pay | Admitting: Family Medicine

## 2010-04-22 ENCOUNTER — Ambulatory Visit (INDEPENDENT_AMBULATORY_CARE_PROVIDER_SITE_OTHER): Payer: 59 | Admitting: Family Medicine

## 2010-04-22 ENCOUNTER — Encounter: Payer: Self-pay | Admitting: Family Medicine

## 2010-04-22 VITALS — BP 140/88 | HR 84 | Temp 98.4°F | Wt 139.0 lb

## 2010-04-22 DIAGNOSIS — H6980 Other specified disorders of Eustachian tube, unspecified ear: Secondary | ICD-10-CM | POA: Insufficient documentation

## 2010-04-22 MED ORDER — TRAMADOL HCL 50 MG PO TABS: 50.0000 mg | ORAL_TABLET | Freq: Four times a day (QID) | ORAL | Status: DC | PRN

## 2010-04-22 NOTE — Progress Notes (Signed)
3 weeks of facial pain and ear pain/pressure.  Went to UC and was started on zithromax for sinus infection.  R ear was progressively sensitive to sound and had more episodic pain.  She called back to UC and was called in cefzil 500mg  bid.  No help with mucinex, abx.  Using nasal rinse. Sx continue.  No fevers/chills. Some nausea. No vomiting. No pain with chewing.  No ST, minimal cough that is episodic.  She has a few pills of the cefzil left.    Meds, vitals, and allergies reviewed.   ROS: See HPI.  Otherwise, noncontributory.  GEN: nad, alert and oriented HEENT: mucous membranes moist, tm w/o erythema, nasal exam w/mild erythema, some clear discharge noted,  OP with cobblestoning NECK: supple w/o LA CV: rrr.   PULM: ctab, no inc wob TM w/o movement on valsalva b Max and frontal sinuses not ttp

## 2010-04-22 NOTE — Assessment & Plan Note (Addendum)
Inc the flonase to 2 sprays bid in each nostril and call back Monday if not better for ENT referral.  Start the tramadol in them meantime for pain.  Finish the abx in meantime. She agrees.  Nontoxic and okay for outpatient fu.

## 2010-04-22 NOTE — Patient Instructions (Signed)
Inc the flonase to 2 sprays twice in each nostril and call back Monday if not better for ENT referral.  Start the tramadol in them meantime for pain.  It can make you drowsy.  Keep using the sinus rinse and your other meds.  Take care.

## 2010-04-23 ENCOUNTER — Encounter: Payer: Self-pay | Admitting: Family Medicine

## 2010-04-27 ENCOUNTER — Telehealth: Payer: Self-pay | Admitting: Family Medicine

## 2010-04-27 DIAGNOSIS — H6980 Other specified disorders of Eustachian tube, unspecified ear: Secondary | ICD-10-CM

## 2010-04-27 NOTE — Telephone Encounter (Signed)
Patient called to say that her ears are not any better and that she wants to be referred to [facility]NT. Pls get her a morning appt and she cant go on April 30th. Pls call her and leave a message on the cell.

## 2010-04-27 NOTE — Telephone Encounter (Signed)
Ordered. Thanks

## 2010-04-30 ENCOUNTER — Ambulatory Visit
Admission: RE | Admit: 2010-04-30 | Discharge: 2010-04-30 | Disposition: A | Discharge status: Home / Self Care | Payer: 59 | Source: Ambulatory Visit | Attending: Otolaryngology | Admitting: Otolaryngology

## 2010-04-30 ENCOUNTER — Other Ambulatory Visit: Payer: Self-pay | Admitting: Otolaryngology

## 2010-05-28 NOTE — Cardiovascular Report (Signed)
NAME:  CHELLSEA, BECKERS                          ACCOUNT NO.:  [id number]   MEDICAL RECORD NO.:  [id number]                   PATIENT TYPE:  OIB   LOCATION:  2011                                 FACILITY:  [facility]   PHYSICIAN:  Darden Palmer., M.D.         DATE OF BIRTH:  May 10, 1955   DATE OF PROCEDURE:  08/24/2001  DATE OF DISCHARGE:  08/25/2001                              CARDIAC CATHETERIZATION   HISTORY:  The patient is a 55 year old female with a previous history of  bypass grafting in November of 202002. She presented with chest pain  suggestive of unstable angina with new inferolateral T wave changes.   COMMENTS ABOUT PROCEDURE:  The patient tolerated the procedure well  initially. Grafts were selected using the JR4 guiding catheter.  After the  procedure while pressure was being held, she complained of some minor  itching involving her right thigh. She then became somewhat short of breath  and was hypertensive.  She was administered IV Benadryl, Solu-Medrol,  although her oxygen saturations never dropped and she never developed  wheezing. She was never hypotensive. Her blood pressure came under better  control with nitroglycerin and the symptoms resolved. It was not felt to be  a major contrast reaction, may have been a minor urticarial reaction with  some anxiety component.   HEMODYNAMIC DATA:  Aorta post-contrast 139/77, LV post-contrast 139/15.   ANGIOGRAPHIC DATA:   LEFT VENTRICULOGRAM:  Left ventriculogram performed in the 30-degree RAO  projection.  The aortic valve was normal. The mitral valve was normal. The  left ventricle appears normal in size.  The left ventricle was hyperdynamic  with an estimated ejection fraction of 80%.  There was a very localized area  of apical hypokinesis present.   CORONARY ARTERIES:  Arise and distribute normally.   Left main coronary artery:  The left main coronary artery appears normal.   Left anterior descending:  The left  anterior descending is diffusely  diseased. There is a proximal 70% stenosis. There has been an area of  aneurysmal dilatation. There is a first diagonal branch which is diffusely  diseased. There has been a severe 90% stenosis prior to the third diagonal  branch.  The distal vessel is occluded.   Circumflex coronary artery:  The circumflex coronary artery is diffusely  diseased proximally with severe 80% segmental narrowing. The vessel then has  bidirectional flow into a second marginal branch.   The right coronary artery:  The vessel is patent proximally. The posterior  descending is occluded and fills by patent graft. There is a severe 90-95%  eccentric stenosis involving the posterolateral branch with bidirectional  filling of the posterolateral branch.   Saphenous vein graft to the obtuse marginal artery is widely patent with  patent proximal distal anastomotic sites.   Saphenous vein graft to the diagonal branch is widely patent with a patent  proximal anastomotic site. The distal anastomotic site may  be 30-40%  narrowed.  The sequential vein graft to the posterior descending and  posterolateral branch is widely patent to the right coronary artery.   Internal mammary graft to the LAD is widely patent.   IMPRESSION:  1. Short-term patency of the saphenous vein grafts to the diagonal, obtuse     marginal, and sequential graft to the posterior descending and     posterolateral, and patent internal mammary graft to the left anterior     descending.  2. Hyperdynamic left ventricular function.  3. Residual sites of  ischemia exists in the acute marginal branch of the     right coronary artery, the third and the first diagonal branches and     potentially the septal perforator system.   RECOMMENDATIONS:  Continued medical therapy.                                               Darden Palmer., M.D.    WST/MEDQ  D:  08/24/2001  T:  08/28/2001  Job:  [id number]   cc:    Talmadge Coventry, M.D.

## 2010-05-28 NOTE — H&P (Signed)
NAME:  Karen Saunders, Karen Saunders                          ACCOUNT NO.:  [id number]   MEDICAL RECORD NO.:  [id number]                   PATIENT TYPE:  PE   LOCATION:                                       FACILITY:  [facility]   PHYSICIAN:  W. Ashley Royalty., M.D.         DATE OF BIRTH:  1955/01/14   DATE OF ADMISSION:  08/24/2001  DATE OF DISCHARGE:                                HISTORY & PHYSICAL   REASON FOR ADMISSION:  For catheterization.   HISTORY:  The patient is a 55 year old black female who has a prior history  of burning substernal chest discomfort and had a stress Cardiolite showing a  previous anterior ischemia in November of 2002.  At that time,  catheterization showed severe three-vessel coronary artery disease with a  proximal 50% LAD stenosis, two diagonal branch stenoses were severely  diseased, mid LAD with severe 99% stenosis, circumflex had a segmental 70%  proximal, mid-vessel 70% and distal 90%.  Right coronary artery had an  occluded posterior descending and a severe 95-99% lesion prior to the  posterolateral branches.  She underwent coronary bypass grafting by Dr.  Alleen Borne on December 01, 2000 with an internal mammary graft to the  LAD, a vein graft to the diagonal branch, a sequential saphenous vein graft  to the PD and PL, a vein graft to the obtuse marginal artery.  She really  got along fairly well after surgery but was admitted to the hospital with  atypical pain in February; she also had some nocturnal palpitations.  After  being seen in the hospital in February, these symptoms had resolved.  She  continued to have occasional hiccups, went through rehab and had a  nondiagnostic EKG and treadmill testing in May of 2003.  She has had an  episodic heavy feeling in her chest, different from previous angina,  relieved when she would exhale deeply.  She presented to the office the day  prior to the catheterization with a three-day history of throat burning  similar to the previous burning pain that she had.  Pain was more likely to  occur at rest or with eating and was not necessarily related to exertion.  It would appear for variable periods of time and may or may not have been  made better with nitroglycerin.  An EKG in the office showed new  inferolateral T wave changes and because of the progressive increase in  symptoms, previous bypass grafting, it was recommended that she have a  catheterization to exclude significant graft disease.   PAST MEDICAL HISTORY:  Past history is remarkable for hypertension and  hyperlipidemia.  There is no history of diabetes.   PAST SURGICAL HISTORY:  Right breast lumpectomy and vaginal hysterectomy.   ALLERGIES:  Reportedly to [facility] in the past, but she is taking it now.   CURRENT MEDICINES:  1. Dicyclomine 10 mg daily.  2. Norvasc 5  mg daily.  3. Prilosec 20 mg daily.  4. Folic acid daily.  5. Aspirin daily.  6. Lipitor 20 mg daily.  7. Lopressor 50 mg b.i.d.  8. Clarinex daily.   FAMILY HISTORY:  Father died of a stroke at age 49.  Mother is 76 and has  hypertension.  One brother and two sisters are healthy.  No premature heart  disease.   SOCIAL HISTORY:  She is divorced.  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  She smoked for 10 years but quit approximately 6 years, does not  use alcohol or caffeine to excess.   REVIEW OF SYSTEMS:  No claudication or TIAs.  History of reflux.  History of  irritable bowel syndrome treated with dicyclomine.  Other than as noted  above, remainder of the review of systems is unremarkable.   PHYSICAL EXAMINATION:  GENERAL:  On examination, she is a pleasant, thin  black female in no acute distress.  VITAL SIGNS:  Her blood pressure is 150/80 sitting, 154/80 standing; pulse  64.  SKIN:  Warm and dry.  EENT/NECK:  No JVD, thyromegaly or bruits.  Funduscopic  unremarkable.  Pharynx negative.  Neck supple.  LUNGS:  Clear to A&P.  CARDIAC:  Normal S1 and S2.  No S3 or murmur.  ABDOMEN:  Soft and nontender without mass.  No aneurysm.  EXTREMITIES:  Femoral and distal pulses are present and are 2 to 3+.   LABORATORY AND ACCESSORY CLINICAL DATA:  Twelve-lead electrocardiogram shows  inferior and lateral T wave inversions which are new since the previous  study.   Chest x-ray shows normal heart size and previous bypass sternotomy changes.   IMPRESSION:  1. Chest pain consistent with unstable angina in a patient with previous     bypass grafting.  2. Treated hypertension.  3. Treated dyslipidemia.  4. History of reflux.  5. History of irritable bowel syndrome.   RECOMMENDATION:  Brought in for same-day catheterization.  Procedure  discussed with the patient fully including the risks of MI, death or CVA and  she is agreeable and willing to proceed.                                               Darden Palmer., M.D.    WST/MEDQ  D:  08/23/2001  T:  08/23/2001  Job:  [id number]   cc:   Talmadge Coventry, M.D.

## 2010-05-28 NOTE — Consult Note (Signed)
Karen Saunders, TUFANO NO.:  [id number]   MEDICAL RECORD NO.:  [id number]          PATIENT TYPE:  EMS   LOCATION:  MAJO                         FACILITY:  [facility]   PHYSICIAN:  James L. Malon Kindle., M.D.DATE OF BIRTH:  December 17, 1955   DATE OF CONSULTATION:  10/16/2003  DATE OF DISCHARGE:                                   CONSULTATION   PRIMARY CARE PHYSICIAN:  Dr. Timmothy Euler.   REASON FOR CONSULTATION:  Epigastric pain.   HISTORY:  A 55 year old African-American female who has a history of chronic  esophageal reflux, for which she takes Aciphex.  For the past couple of days  she has felt vague queasiness after eating, that is different than her  reflux symptoms.  Last night it woke her from sleep.  She took the Aciphex  and antacids without improvement; had severe epigastric pain that radiated  up into her chest and down her arm.  Her symptoms were somewhat similar to  the discomfort that she has been having for the past several weeks, and also  somewhat similar to her angina.  She has been seen in the emergency room.  Workup has shown a normal EKG and cardiac enzymes.  It is not felt by Dr.  Donnie Aho that this is due to cardiology issue.  The patient notes no change in  her bowel habits, no nausea or vomiting and no symptoms prior to previous  several days.  She is not taking any medications for arthritis, has not had  any melenic stools.   CURRENT MEDICATIONS:  Lipitor, Aciphex, __________, Norvasc, Plavix,  aspirin.   ALLERGIES:  CT DYE.   PAST MEDICAL HISTORY:  1.  Coronary artery disease, status post CABG x8.  She states she has never      had an MI and has good cardiac function.  2.  History of high lipids.  3.  History of hypertension.   PAST SURGICAL HISTORY:  Include only hysterectomy.   FAMILY HISTORY:  Mother still living.  Father died of an MI.  There is no  family history of any cancer.   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  Does  not smoke nor drink.   REVIEW OF SYSTEMS:  As noted above.   PHYSICAL EXAMINATION:  VITAL SIGNS:  Afebrile.  Blood pressure 132/90.  GENERAL:  A pleasant African-American woman in no acute distress.  HEENT:  Eyes clear and not icteric.  Throat there is no evidence of thrush.  She has had contrast for a CT and her tongue is red, but there is no thrush.  NECK:  Supple; no lymphadenopathy.  LUNGS:  Clear.  HEART:  Regular rate and rhythm; without murmurs or gallops.  ABDOMEN:  Nondistended and excellent bowel sounds; soft with mild epigastric  tenderness.   PERTINENT DATA:  Ultrasound of the abdomen is negative for gallstones.  Labs  are all unremarkable.   ASSESSMENT:  Epigastric pain; could be due to a problem with her pancreas  versus mesenteric ischemia.  She is known to have ischemic coronary  disease  and clearly has some risk factors there.   PLAN:  Will check the CT scan of the chest that is already ordered, and make  further recommendations depending on those results.       JLE/MEDQ  D:  10/16/2003  T:  10/16/2003  Job:  [id number]

## 2010-05-28 NOTE — Op Note (Signed)
NAME:  Karen Saunders, Karen Saunders                ACCOUNT NO.:  [id number]   MEDICAL RECORD NO.:  [id number]          PATIENT TYPE:  AMB   LOCATION:  ENDO                         FACILITY:  [facility]   PHYSICIAN:  Anselmo Rod, M.D.  DATE OF BIRTH:  June 23, 1955   DATE OF PROCEDURE:  DATE OF DISCHARGE:                                 OPERATIVE REPORT   PROCEDURE PERFORMED:  Esophagogastroduodenoscopy with multiple cold  biopsies.   ENDOSCOPIST:  Anselmo Rod, M.D.   INSTRUMENT USED:  Olympus video panendoscope.   INDICATIONS FOR PROCEDURE:  55 year old African American female undergoing  EGD for anemia, rule out peptic ulcer disease, esophagitis, gastritis, etc.   PREPROCEDURE PREPARATION:  Informed consent was procured from the patient.  The patient fasted for 8 hours prior to the procedure.  Risks and benefits  of the procedure were discussed with the patient in great detail.   PREPROCEDURE PHYSICAL:  The patient had stable vital signs.  NECK:  Supple.  Chest clear to auscultation.  S2 regular.  Abdomen soft with normal bowel  sounds.   DESCRIPTION OF PROCEDURE:  The patient was placed in the left lateral  decubitus position and sedated with 40 mcg of fentanyl and 6 mg of Versed in  slow incremental doses.  Once the patient was adequately sedated and  maintained on low-flow oxygen and continuous cardiac monitoring, the Olympus  video panendoscope was advanced through the mouth piece over the tongue into  the esophagus under direct vision.  The entire esophagus was widely patent  with no evidence of ring, stricture, mass, esophagitis or Barrett's mucosa.  The scope was then advanced into the stomach.  Mild diffuse gastritis was  noted by antral biopsies were done to rule out presence of H pylori by  pathology.  Retroflexion in the high cardia revealed no evidence of a hiatal  hernia.  The proximal small bowel appeared normal.  There was no outlet  obstruction.  Small-bowel biopsies were done  to rule out sprue.  The patient  tolerated the procedure well without immediate complications.   IMPRESSION:  1. Normal-appearing esophagus.  2. Mild diffuse gastritis, biopsies done for H pylori.  No ulcers,      erosions, masses or polyps seen.  3. Normal proximal small bowel, biopsies done to rule out sprue.   RECOMMENDATIONS:  1. Await pathology results.  2. Avoid all nonsteroidals including aspirin for now.  3. Proceed with a colonoscopy at this time.  4. Further recommendations will be made in follow-up      Anselmo Rod, M.D.  Electronically Signed     JNM/MEDQ  D:  11/30/2005  T:  11/30/2005  Job:  [id number]   cc:   Talmadge Coventry, M.D.  Georga Hacking, M.D.

## 2010-05-28 NOTE — Discharge Summary (Signed)
[facility]  Patient:    Karen Saunders, Karen Saunders Visit Number: [id number] MRN: [id number]          Service Type: MED Location: 2000 2006 01 Attending Physician:  Cleatrice Burke Dictated by:   Tollie Pizza Collins, P.A.-C. Admit Date:  11/28/2000 Discharge Date: 12/06/2000   CC:         Alleen Borne, M.D.  Darden Palmer., M.D.  Talmadge Coventry, M.D.   Discharge Summary  ADDENDUM:  DISCHARGE MEDICATIONS: 1. Enteric-coated aspirin 325 mg q.d. 2. Lopressor 25 mg q.8h. 3. Folic acid 1 mg q.d. 4. Ferrous sulfate 325 mg b.i.d. 5. Colace 100 mg b.i.d. 6. Tylox 1-2 q.4h. p.r.n. for pain. 7. Prilosec 20 mg q.d. 8. Dicyclomine 2 mg q.i.d.  DISCHARGE INSTRUCTIONS:  She is to refrain from driving, heavy lifting, or strenuous activity.  She should continue low fat, low sodium diet.  She is asked to shower daily and clean the incision with soap and water.  FOLLOW-UP:  She was to see Dr. Alleen Borne in the office on Tuesday, December 19, 2000 at 10:30 a.m. and is asked to bring her chest x-ray to that appointment.  She is asked to call Dr. Natale Milch office and make an appointment to see him in the next two weeks as well. Dictated by:   Tollie Pizza Collins, P.A.-C. Attending Physician:  Cleatrice Burke DD:  01/09/01 TD:  01/09/01 Job: [id number]

## 2010-06-04 ENCOUNTER — Telehealth: Payer: Self-pay | Admitting: *Deleted

## 2010-06-04 DIAGNOSIS — R5383 Other fatigue: Secondary | ICD-10-CM

## 2010-06-04 DIAGNOSIS — D649 Anemia, unspecified: Secondary | ICD-10-CM

## 2010-06-04 NOTE — Telephone Encounter (Signed)
Left message on voicemail advising to call and schedule a lab appt. Advised you need the results before you can advise any further but to continue the MVT for now.

## 2010-06-04 NOTE — Telephone Encounter (Signed)
If she's having more trouble with fatigue, then we should recheck her labs.  I put in the orders.  Please have her continue the vitamin for now and let me see the lab results first.  Thanks.

## 2010-06-04 NOTE — Telephone Encounter (Signed)
Pt states she feels very tired all the time and has been told that she is anemic.  She is asking what she can take, takes a multi vitamin now.  Please advise.

## 2010-09-28 ENCOUNTER — Other Ambulatory Visit: Payer: Self-pay | Admitting: Family Medicine

## 2010-10-14 ENCOUNTER — Encounter: Payer: Self-pay | Admitting: Family Medicine

## 2010-10-14 ENCOUNTER — Ambulatory Visit (INDEPENDENT_AMBULATORY_CARE_PROVIDER_SITE_OTHER): Payer: 59 | Admitting: Family Medicine

## 2010-10-14 DIAGNOSIS — M62838 Other muscle spasm: Secondary | ICD-10-CM

## 2010-10-14 MED ORDER — CYCLOBENZAPRINE HCL 10 MG PO TABS: 5.0000 mg | ORAL_TABLET | Freq: Three times a day (TID) | ORAL | Status: DC | PRN

## 2010-10-14 NOTE — Patient Instructions (Signed)
Try the flexeril for the muscle spasm and let me know if that doesn't help. Take care.  Glad to see you.

## 2010-10-14 NOTE — Progress Notes (Signed)
Inc in stress at work and with [employer].  She's working more hours at [employer] on top of her regular job.  Now with L shoulder/trap pain.  She tried some stretching and exercise.  She is more tearful.  She took some days off and that helped some, "but that's not me."  Sleep is disrupted- "sometimes I sleep better in a chair."  Wakes up feeling tensed.  R handed.  L trap area is tight.  Local heat didn't help much.  She thinks the muscle pain is related to the other changes, ie with mood.   "I hope I'm not depressed, but I don't know."  "Frustrated."  "Overwhelming."  No SI/HI.    Meds, vitals, and allergies reviewed.   ROS: See HPI.  Otherwise, noncontributory.  nad ncat Mmm rrr ctab Midline sternotomy scar noted. L shoulder with normal ROM and no impingement.  AC joint not ttp L trap ttp with muscle spasm noted.  Neck with normal ROM except as limited by L trap pain, no midline pain

## 2010-10-15 DIAGNOSIS — M62838 Other muscle spasm: Secondary | ICD-10-CM | POA: Insufficient documentation

## 2010-10-15 NOTE — Assessment & Plan Note (Addendum)
Stretch and use muscle relaxer.  This may help sleep.  She doesn't want to start meds for mood and she doesn't appear to have to have them yet.  She'll monitor her progress and call back as needed.  I do think the muscle spasm is related to her mood and I talked to her about that.  She agreed.

## 2010-10-22 ENCOUNTER — Telehealth: Payer: Self-pay | Admitting: *Deleted

## 2010-10-22 NOTE — Telephone Encounter (Signed)
Patient requesting out of work note for all of next week.

## 2010-10-23 NOTE — Telephone Encounter (Signed)
Please send a note to be out of work for next week.  Also, have pt plan to let me know if she isn't doing better at the end of the week so we can make plans about follow up and treatment.

## 2010-10-25 ENCOUNTER — Encounter: Payer: Self-pay | Admitting: *Deleted

## 2010-10-25 NOTE — Telephone Encounter (Signed)
Work note written for signature and message left on VM of patient's home number that it is ready for pick up at the front desk.

## 2010-11-01 ENCOUNTER — Telehealth: Payer: Self-pay | Admitting: *Deleted

## 2010-11-01 DIAGNOSIS — M62838 Other muscle spasm: Secondary | ICD-10-CM

## 2010-11-01 NOTE — Telephone Encounter (Signed)
Pt has been out of work with shoulder pain, has not slept in 2 nights.  She was supposed to go back today but was unable to.  She would like to have her note extended.  She doesn't know if she will be able to go back tomorrow either.  Please call her when ready.

## 2010-11-01 NOTE — Telephone Encounter (Signed)
I talked to pt.  Flexeril helps some with the pain/spasm in her back, but she is still having sig pain and sleep disruption/deprivation.  Will hold her out of work today and tomorrow, will set up PT.  She agrees.  I put the work note up front for pickup.

## 2010-11-09 ENCOUNTER — Encounter: Payer: Self-pay | Admitting: Family Medicine

## 2010-11-09 ENCOUNTER — Ambulatory Visit (INDEPENDENT_AMBULATORY_CARE_PROVIDER_SITE_OTHER): Payer: 59 | Admitting: Family Medicine

## 2010-11-09 VITALS — BP 154/98 | HR 81 | Temp 98.5°F | Wt 146.1 lb

## 2010-11-09 DIAGNOSIS — Z5689 Other problems related to employment: Secondary | ICD-10-CM

## 2010-11-09 DIAGNOSIS — M62838 Other muscle spasm: Secondary | ICD-10-CM

## 2010-11-09 DIAGNOSIS — Z23 Encounter for immunization: Secondary | ICD-10-CM

## 2010-11-09 DIAGNOSIS — E785 Hyperlipidemia, unspecified: Secondary | ICD-10-CM

## 2010-11-09 DIAGNOSIS — Z566 Other physical and mental strain related to work: Secondary | ICD-10-CM

## 2010-11-09 DIAGNOSIS — Z0000 Encounter for general adult medical examination without abnormal findings: Secondary | ICD-10-CM

## 2010-11-09 DIAGNOSIS — I10 Essential (primary) hypertension: Secondary | ICD-10-CM

## 2010-11-09 DIAGNOSIS — I2510 Atherosclerotic heart disease of native coronary artery without angina pectoris: Secondary | ICD-10-CM

## 2010-11-09 DIAGNOSIS — D649 Anemia, unspecified: Secondary | ICD-10-CM

## 2010-11-09 DIAGNOSIS — M810 Age-related osteoporosis without current pathological fracture: Secondary | ICD-10-CM

## 2010-11-09 LAB — IBC PANEL
Iron: 55 ug/dL (ref 42–145)
Saturation Ratios: 10.8 % — ABNORMAL LOW (ref 20.0–50.0)
Transferrin: 362.6 mg/dL — ABNORMAL HIGH (ref 212.0–360.0)

## 2010-11-09 LAB — CBC WITH DIFFERENTIAL/PLATELET
Basophils Absolute: 0 10*3/uL (ref 0.0–0.1)
Basophils Relative: 0.8 % (ref 0.0–3.0)
Eosinophils Absolute: 0 10*3/uL (ref 0.0–0.7)
Eosinophils Relative: 0.6 % (ref 0.0–5.0)
HCT: 34.8 % — ABNORMAL LOW (ref 36.0–46.0)
Hemoglobin: 11.9 g/dL — ABNORMAL LOW (ref 12.0–15.0)
Lymphocytes Relative: 49.8 % — ABNORMAL HIGH (ref 12.0–46.0)
Lymphs Abs: 1.3 10*3/uL (ref 0.7–4.0)
MCHC: 34.3 g/dL (ref 30.0–36.0)
MCV: 91.7 fl (ref 78.0–100.0)
Monocytes Absolute: 0.3 10*3/uL (ref 0.1–1.0)
Monocytes Relative: 9.3 % (ref 3.0–12.0)
Neutro Abs: 1.1 10*3/uL — ABNORMAL LOW (ref 1.4–7.7)
Neutrophils Relative %: 39.5 % — ABNORMAL LOW (ref 43.0–77.0)
Platelets: 189 10*3/uL (ref 150.0–400.0)
RBC: 3.8 Mil/uL — ABNORMAL LOW (ref 3.87–5.11)
RDW: 13.8 % (ref 11.5–14.6)
WBC: 2.7 10*3/uL — ABNORMAL LOW (ref 4.5–10.5)

## 2010-11-09 LAB — COMPREHENSIVE METABOLIC PANEL
ALT: 37 U/L — ABNORMAL HIGH (ref 0–35)
AST: 40 U/L — ABNORMAL HIGH (ref 0–37)
Albumin: 4.5 g/dL (ref 3.5–5.2)
Alkaline Phosphatase: 97 U/L (ref 39–117)
BUN: 15 mg/dL (ref 6–23)
CO2: 27 mEq/L (ref 19–32)
Calcium: 9.9 mg/dL (ref 8.4–10.5)
Chloride: 104 mEq/L (ref 96–112)
Creatinine, Ser: 1 mg/dL (ref 0.4–1.2)
GFR: 76.11 mL/min (ref >60.00)
Glucose, Bld: 85 mg/dL (ref 70–99)
Potassium: 3.7 mEq/L (ref 3.5–5.1)
Sodium: 141 mEq/L (ref 135–145)
Total Bilirubin: 0.2 mg/dL — ABNORMAL LOW (ref 0.3–1.2)
Total Protein: 7.8 g/dL (ref 6.0–8.3)

## 2010-11-09 LAB — LIPID PANEL
Cholesterol: 185 mg/dL (ref 0–200)
HDL: 54.8 mg/dL (ref >39.00)
Total CHOL/HDL Ratio: 3
Triglycerides: 233 mg/dL — ABNORMAL HIGH (ref 0.0–149.0)
VLDL: 46.6 mg/dL — ABNORMAL HIGH (ref 0.0–40.0)

## 2010-11-09 NOTE — Patient Instructions (Signed)
Take care. Glad to see you.   Stop the caduet for 1 week and see if the aches get better. If they do, then call me and let me know. If they don't get better, then start back on the medicine.  You can get your results through our phone system.  Follow the instructions on the blue card.

## 2010-11-09 NOTE — Progress Notes (Signed)
CPE- See plan.  Routine anticipatory guidance given to patient.  See health maintenance.  Elevated Cholesterol: Using medications without problems:yes Muscle aches: in shoulders, prev noted (she did some better with flexeril- she's schedule for PT for the muscle pain) Diet compliance:yes Exercise:some  She continues to have a tough work situation, but her mood is improved and she's trying to deal with daily stressors.    H/o anemia.  Prev with some fatigue.  S/p hysterectomy and h/o colonoscopy per Dr Loreta Ave.   Hypertension:    Using medication without problems or lightheadedness: yes Chest pain with exertion:no Edema:no Short of breath:no  CAD- Seeing Donnie Aho next month.  No chest pain.  No edema. S/p CABG.  Compliant with meds.   H/o osteoporosis but intolerant of mult meds and vit D supplementation.  See plan.   PMH and SH reviewed  Meds, vitals, and allergies reviewed.   ROS: See HPI.  Otherwise negative.    GEN: nad, alert and oriented HEENT: mucous membranes moist NECK: supple w/o LA CV: rrr PULM: ctab, no inc wob ABD: soft, +bs EXT: no edema SKIN: no acute rash Pelvic and breast exam per Dr. Senaida Ores with gyn.

## 2010-11-10 ENCOUNTER — Encounter: Payer: Self-pay | Admitting: Family Medicine

## 2010-11-10 DIAGNOSIS — Z0000 Encounter for general adult medical examination without abnormal findings: Secondary | ICD-10-CM | POA: Insufficient documentation

## 2010-11-10 DIAGNOSIS — D649 Anemia, unspecified: Secondary | ICD-10-CM | POA: Insufficient documentation

## 2010-11-10 DIAGNOSIS — Z566 Other physical and mental strain related to work: Secondary | ICD-10-CM | POA: Insufficient documentation

## 2010-11-10 LAB — LDL CHOLESTEROL, DIRECT: Direct LDL: 83.2 mg/dL

## 2010-11-10 NOTE — Assessment & Plan Note (Signed)
H/o, see notes on labs.  No known blood loss.

## 2010-11-10 NOTE — Assessment & Plan Note (Signed)
Hold statin for now and see if aches improve.  If not improved, then restart medicine.  If improved, she'll call back with update.  Check labs today.  D/w pt about exercise.

## 2010-11-10 NOTE — Assessment & Plan Note (Signed)
Hold statin and see if this helps with spasm in trapezius.

## 2010-11-10 NOTE — Assessment & Plan Note (Signed)
No changes in meds now, see notes on labs.

## 2010-11-10 NOTE — Assessment & Plan Note (Signed)
Currently improved, no SI/HI.  She'll let me know if this worsens.  Okay for outpatient f/u.

## 2010-11-10 NOTE — Assessment & Plan Note (Signed)
No plan to recheck DXA as it wouldn't change mgmt.  She can't tolerate meds/vit D replacement.  She agrees.  Continue weight bearing exercise.

## 2010-11-10 NOTE — Assessment & Plan Note (Signed)
Pelvic and breast exam per Dr. Senaida Ores with gyn.  No plan for DXA now.  Mammogram pending.  Colonoscopy per Dr. Loreta Ave.  Flu shot done, Pna vaccine done today.  See notes on labs.  Healthy habits encouraged.  Td up to date.

## 2010-11-10 NOTE — Assessment & Plan Note (Addendum)
No exertional sx and has f/u with cards next month.  See notes on labs.

## 2010-11-11 LAB — VITAMIN B12: Vitamin B-12: 255 pg/mL (ref 211–911)

## 2010-11-11 LAB — TSH: TSH: 1.71 u[IU]/mL (ref 0.35–5.50)

## 2010-11-12 ENCOUNTER — Encounter: Payer: Self-pay | Admitting: Family Medicine

## 2010-11-12 ENCOUNTER — Telehealth: Payer: Self-pay | Admitting: Family Medicine

## 2010-11-12 NOTE — Telephone Encounter (Signed)
I called pt. With prev unremarkable heme eval ~2011, and cbc w/o sig change. Other labs are okay for now.  She still had some aches, will stay off the stain a few more days and then will call back with update.  She agrees with plan.

## 2010-11-15 ENCOUNTER — Ambulatory Visit: Payer: 59 | Attending: Family Medicine | Admitting: Physical Therapy

## 2010-11-15 DIAGNOSIS — M542 Cervicalgia: Secondary | ICD-10-CM | POA: Insufficient documentation

## 2010-11-15 DIAGNOSIS — R293 Abnormal posture: Secondary | ICD-10-CM | POA: Insufficient documentation

## 2010-11-15 DIAGNOSIS — IMO0001 Reserved for inherently not codable concepts without codable children: Secondary | ICD-10-CM | POA: Insufficient documentation

## 2010-11-15 DIAGNOSIS — M6281 Muscle weakness (generalized): Secondary | ICD-10-CM | POA: Insufficient documentation

## 2010-11-23 ENCOUNTER — Encounter: Payer: 59 | Admitting: Rehabilitation

## 2010-11-26 ENCOUNTER — Telehealth: Payer: Self-pay | Admitting: *Deleted

## 2010-11-26 NOTE — Telephone Encounter (Signed)
Pt was told to stop caduet a couple of weeks ago because of muscle pain.  She did this but her pain isn't any better.  She is asking what she should do.  She went to physical therapy a couple of times but had to cancel the rest of her appts because she cant take the time off from work.

## 2010-11-26 NOTE — Telephone Encounter (Signed)
I called her about this.  She was told at PT that the cause was likely muscle tension and she has worked on this, ie rearranged her work station.  She thinks that PT changes and the muscle relaxer did more good than the time off statin.  She'll continue as is, restart the statin and let me know if sx inc when back on statin.  She agrees with plan.

## 2010-11-30 ENCOUNTER — Encounter: Payer: 59 | Admitting: Rehabilitation

## 2010-12-06 ENCOUNTER — Encounter: Payer: 59 | Admitting: Rehabilitation

## 2010-12-08 ENCOUNTER — Encounter: Payer: 59 | Admitting: Rehabilitation

## 2011-02-11 ENCOUNTER — Other Ambulatory Visit: Payer: Self-pay | Admitting: Cardiology

## 2011-02-24 ENCOUNTER — Other Ambulatory Visit: Payer: Self-pay | Admitting: Family Medicine

## 2011-03-07 LAB — HM COLONOSCOPY

## 2011-03-08 ENCOUNTER — Ambulatory Visit (INDEPENDENT_AMBULATORY_CARE_PROVIDER_SITE_OTHER): Payer: 59 | Admitting: Family Medicine

## 2011-03-08 ENCOUNTER — Encounter: Payer: Self-pay | Admitting: Family Medicine

## 2011-03-08 VITALS — BP 142/100 | HR 78 | Temp 99.9°F | Wt 141.0 lb

## 2011-03-08 DIAGNOSIS — R059 Cough, unspecified: Secondary | ICD-10-CM

## 2011-03-08 DIAGNOSIS — R05 Cough: Secondary | ICD-10-CM

## 2011-03-08 MED ORDER — AZITHROMYCIN 250 MG PO TABS: ORAL_TABLET | ORAL | Status: AC

## 2011-03-08 MED ORDER — HYDROCODONE-HOMATROPINE 5-1.5 MG/5ML PO SYRP: 5.0000 mL | ORAL_SOLUTION | Freq: Three times a day (TID) | ORAL | Status: AC | PRN

## 2011-03-08 NOTE — Patient Instructions (Signed)
Start the antibiotics today and use the cough medicine (sedation caution) as needed.  Out of work in meantime.  Drink plenty of fluids, take tylenol as needed, and gargle with warm salt water for your throat.  This should gradually improve.  Take care.  Let us know if you have other concerns.

## 2011-03-08 NOTE — Progress Notes (Signed)
Mult sick contacts.  Had a ST and congestion about 3 weeks ago. Now sick with cough, congestion, aches, burning in chest from cough, fever over last 2-3 days ago.  Episodic ear pain.  Face feels stuffy.  No sputum production. Some rhinorrhea, taking mucinex.  Taking delsym for cough, minimal relief.    Meds, vitals, and allergies reviewed.   ROS: See HPI.  Otherwise, noncontributory.  GEN: nad, alert and oriented HEENT: mucous membranes moist, tm w/o erythema, nasal exam w/o erythema, clear discharge noted,  OP with cobblestoning NECK: supple w/o LA CV: rrr.   PULM: coarse BS but no no inc wob EXT: no edema SKIN: no acute rash

## 2011-03-09 ENCOUNTER — Telehealth: Payer: Self-pay | Admitting: Family Medicine

## 2011-03-09 NOTE — Telephone Encounter (Signed)
Agreed -

## 2011-03-09 NOTE — Telephone Encounter (Signed)
Triage Record Num: [id number] Operator: Jeraldine Loots Patient Name: Karen Saunders Call Date & Time: 03/09/2011 3:04:23PM Patient Phone: [phone] PCP: Crawford Givens Patient Gender: Female PCP Fax : Patient DOB: 08/19/1955 Practice Name: [facility] Reason for Call: Caller: [name]/Patient; PCP: Crawford Givens [name]; CB#: [phone]; Seen on Tuesday for bronchitis and started on Azithromycin and Homatropine Syrup. She noticed that after taking the meds that she had extreme dryness in her mouth and sinus area. She has added a humidifier to her room. Has been doing a nasal rinse bid. LMP -had a hysterectomy. She will rest; push fluids and take the Tylenol q4 hours. If no improvement by Thursday will call for return appt. Protocol(s) Used: Upper Respiratory Infection (URI) Recommended Outcome per Protocol: See Provider within 24 hours Reason for Outcome: Mild to moderate headache for more than 24 hours unrelieved with nonprescription medications Care Advice: ~ 03/09/2011 3:10:39PM Page 1 of 1 [email or web address]

## 2011-03-10 DIAGNOSIS — R059 Cough, unspecified: Secondary | ICD-10-CM | POA: Insufficient documentation

## 2011-03-10 DIAGNOSIS — R05 Cough: Secondary | ICD-10-CM | POA: Insufficient documentation

## 2011-03-10 NOTE — Assessment & Plan Note (Signed)
Given her hx, I would treat.  Cover with zmax for atypicals and use cough medicine with sedation caution.  Supportive tx o/w.  She agrees. F/u prn.

## 2011-04-11 ENCOUNTER — Other Ambulatory Visit: Payer: Self-pay | Admitting: Family Medicine

## 2011-04-12 NOTE — Telephone Encounter (Signed)
Electronic refill request

## 2011-04-12 NOTE — Telephone Encounter (Signed)
Sent. Thanks.   

## 2011-04-28 ENCOUNTER — Other Ambulatory Visit: Payer: Self-pay | Admitting: *Deleted

## 2011-04-28 MED ORDER — HYDROCODONE-HOMATROPINE 5-1.5 MG/5ML PO SYRP: 5.0000 mL | ORAL_SOLUTION | Freq: Three times a day (TID) | ORAL | Status: AC | PRN

## 2011-04-28 NOTE — Telephone Encounter (Signed)
Faxed refill request   

## 2011-04-28 NOTE — Telephone Encounter (Signed)
Medication phoned to pharmacy.  LMOVM of patient's home and cell phone.

## 2011-04-28 NOTE — Telephone Encounter (Signed)
Please call in the cough medicine for her to use if needed and have her get seen at the walk in clinic if that fits her schedule better.  Thanks.

## 2011-04-28 NOTE — Telephone Encounter (Signed)
Triage Record Num: [id number] Operator: Durward Mallard DiMatteis Patient Name: Karen Saunders Call Date & Time: 04/28/2011 12:46:19PM Patient Phone: [phone] PCP: Crawford Givens Patient Gender: Female PCP Fax : Patient DOB: 12/03/1955 Practice Name: [facility] Day Reason for Call: Caller: Auda/Patient; PCP: Crawford Givens [name]; CB#: [phone]; Call regarding Cough; sx started 04/24/11; using OTC cough med but not helping; using humidifier; tried Mucinex; no real relief; cough is worse at night; no congestion; no fever; Triaged per Cough -Adult Guideline; See in 4 hr d/t new or worsening cough and known cardiac condition; no appt available for today; Phyllis called at the office and was instructed that office will be able to see pt at 3:00pm today; pt refused appt because she has to be at her 2nd job at 5:00pm at [employer]; she states that she will have a Physician at [facility] [employer] check her out Protocol(s) Used: Cough - Adult Recommended Outcome per Protocol: See Provider within 4 hours Reason for Outcome: New or worsening cough AND known cardiac or respiratory condition Care Advice: ~ IMMEDIATE ACTION 04/28/2011 1:06:12PM Page 1 of 1 [email or web address]

## 2011-05-16 ENCOUNTER — Encounter: Payer: Self-pay | Admitting: Family Medicine

## 2011-05-16 ENCOUNTER — Ambulatory Visit (INDEPENDENT_AMBULATORY_CARE_PROVIDER_SITE_OTHER): Payer: 59 | Admitting: Family Medicine

## 2011-05-16 VITALS — BP 132/86 | HR 84 | Temp 98.4°F | Wt 143.8 lb

## 2011-05-16 DIAGNOSIS — R42 Dizziness and giddiness: Secondary | ICD-10-CM

## 2011-05-16 DIAGNOSIS — J069 Acute upper respiratory infection, unspecified: Secondary | ICD-10-CM

## 2011-05-16 NOTE — Progress Notes (Signed)
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  Mult sick exposures.  Some sx started a few days ago.  R ear>L ear feels stuffy.  No help with coricidin.  She has less ear pressure today.  ST, scratchy this AM. Minimal cough, but throat is irritated.    Episodic vertigo, this predates other symptoms be a few weeks.  Not presyncopal.  Some better if sitting still.  Can happen supine or upright.  Not worse with position change.  Can happen at rest.  No nausea with the episodes.  Episode is <66min.  No other focal neuro changes.  Somedays are worse than others.  No BP med changes.  No CP, SOB, BLE edema.  Hasn't tried any meds for the vertigo sx.  No palpitations.    Meds, vitals, and allergies reviewed.   ROS: See HPI.  Otherwise, noncontributory.  GEN: nad, alert and oriented HEENT: mucous membranes moist, tm w/o erythema, nasal exam w/o erythema, clear discharge noted,  OP with cobblestoning NECK: supple w/o LA CV: rrr.   PULM: ctab, no inc wob EXT: no edema SKIN: no acute rash CN 2-12 wnl B, S/S/DTR wnl x4

## 2011-05-16 NOTE — Patient Instructions (Signed)
Call me if not better in about 1 week, call sooner if worse.  Try over the counter meclizine 12.5-25mg  2-3 times a day if needed for vertigo.  Try to get some rest, gargle with salt water, and drink plenty of fluids.

## 2011-05-17 DIAGNOSIS — R42 Dizziness and giddiness: Secondary | ICD-10-CM | POA: Insufficient documentation

## 2011-05-17 DIAGNOSIS — J069 Acute upper respiratory infection, unspecified: Secondary | ICD-10-CM | POA: Insufficient documentation

## 2011-05-17 NOTE — Assessment & Plan Note (Signed)
Unclear source but benign exam and no sx with head turning.  Would try otc meclizine and f/u prn.  We can w/u if this continues.  She agrees.  No red flag sx and this doesn't appear to be BP/cardiac in origin.

## 2011-05-17 NOTE — Assessment & Plan Note (Signed)
Likely viral, should resolve, but potentially contagious.  Work note given, supportive tx.

## 2011-06-20 ENCOUNTER — Telehealth: Payer: Self-pay | Admitting: Family Medicine

## 2011-06-20 NOTE — Telephone Encounter (Signed)
Agreed, can return to work when lesions are scabbed.  Had hydrocodone rx and lidocaine patches along with valtrex.  She'll call back if pain continues and needs help with pain control (or if she needs a work note).

## 2011-06-20 NOTE — Telephone Encounter (Signed)
Caller: Katira/Patient; PCP: Crawford Givens [name]; CB#: [phone]; ; ; Call regarding Dx With Shingles This Weekend. Stayed Out of Work Today and Wants To Know If They Are Contagious.  Seen in UC over the weekend 06/18/11.  Wants to know if she is contagious.  Given Lidoderm which covers the spots.  Per Health Education guidelines, advised to keep lesions covered, but if feels up to going to work, should be able to, and certainly once all the spots dry up and scab over.  No further questions or concerns.

## 2011-06-21 ENCOUNTER — Telehealth: Payer: Self-pay

## 2011-06-21 NOTE — Telephone Encounter (Signed)
Patient advised. Note left at front desk for pick up.  

## 2011-06-21 NOTE — Telephone Encounter (Signed)
Pt request work excuse from 06/20/11 thru 06/24/11 due to shingles. Planning to return to work on 06/27/11. Call when ready for pick up.

## 2011-06-21 NOTE — Telephone Encounter (Signed)
Note done, please give to patient.

## 2011-06-27 ENCOUNTER — Telehealth: Payer: Self-pay | Admitting: Family Medicine

## 2011-06-27 MED ORDER — HYDROCODONE-ACETAMINOPHEN 5-500 MG PO TABS: 1.0000 | ORAL_TABLET | Freq: Four times a day (QID) | ORAL | Status: AC | PRN

## 2011-06-27 NOTE — Telephone Encounter (Signed)
Medication phoned to pharmacy.  

## 2011-06-27 NOTE — Telephone Encounter (Signed)
Please call in

## 2011-06-27 NOTE — Telephone Encounter (Signed)
Caller: Kalina/Patient; PCP: Sunnie Nielsen); CB#: [phone]; ; ; Call regarding Med Refill;  Kathie Rhodes calling regarding refill on hydrocodone that was prescribed at [facility] 06/25/11 for shingles, states Dr. Para March asked her if she needed refill until her OC 06/30/11 and said no originally but she needs [phone].

## 2011-06-30 ENCOUNTER — Encounter: Payer: Self-pay | Admitting: Family Medicine

## 2011-06-30 ENCOUNTER — Ambulatory Visit (INDEPENDENT_AMBULATORY_CARE_PROVIDER_SITE_OTHER): Payer: 59 | Admitting: Family Medicine

## 2011-06-30 VITALS — BP 142/94 | HR 86 | Temp 98.1°F | Wt 144.8 lb

## 2011-06-30 DIAGNOSIS — B029 Zoster without complications: Secondary | ICD-10-CM | POA: Insufficient documentation

## 2011-06-30 MED ORDER — AMITRIPTYLINE HCL 25 MG PO TABS: 12.5000 mg | ORAL_TABLET | Freq: Every day | ORAL | Status: DC

## 2011-06-30 NOTE — Progress Notes (Signed)
R chest wall with zoster prev diagnosed at [facility].   Still needing episodic vicodin use.  Rash improved, but skin is still sensitive.  She was asking about options.   Meds, vitals, and allergies reviewed.   ROS: See HPI.  Otherwise, noncontributory.  nad R chest wall with resolving dermatomal rash with concurrent local change in skin sensation

## 2011-06-30 NOTE — Assessment & Plan Note (Signed)
Resolving, path/phys d/w pt.  Would use low dose TCA at night, gradually inc the dose as tolerated if needed.  Continue prn vicodin use for now.  This should gradually improve.  She may need neurontin if pain isn't well controlled with current meds.  She agrees with plan.

## 2011-06-30 NOTE — Patient Instructions (Addendum)
Start with 1/2 tab of amitriptyline at night.  If you have a dry mouth or sedation, then stop it.  If it helps, continue it.  You can slowly increase up to 2 pills at night.  Take care.

## 2011-09-03 ENCOUNTER — Other Ambulatory Visit: Payer: Self-pay | Admitting: Family Medicine

## 2011-09-05 ENCOUNTER — Other Ambulatory Visit: Payer: Self-pay | Admitting: *Deleted

## 2011-09-05 NOTE — Telephone Encounter (Signed)
Had been on prev.  rx sent.

## 2011-09-05 NOTE — Telephone Encounter (Signed)
Electronic refill request. Medication is not currently on meds list.  Please advise.  

## 2011-09-06 ENCOUNTER — Encounter: Payer: Self-pay | Admitting: Family Medicine

## 2011-09-06 ENCOUNTER — Ambulatory Visit (INDEPENDENT_AMBULATORY_CARE_PROVIDER_SITE_OTHER): Payer: 59 | Admitting: Family Medicine

## 2011-09-06 VITALS — BP 144/94 | HR 76 | Temp 98.3°F | Wt 147.8 lb

## 2011-09-06 DIAGNOSIS — R5383 Other fatigue: Secondary | ICD-10-CM | POA: Insufficient documentation

## 2011-09-06 DIAGNOSIS — R5381 Other malaise: Secondary | ICD-10-CM

## 2011-09-06 LAB — COMPREHENSIVE METABOLIC PANEL
ALT: 28 U/L (ref 0–35)
AST: 36 U/L (ref 0–37)
Albumin: 4.2 g/dL (ref 3.5–5.2)
Alkaline Phosphatase: 74 U/L (ref 39–117)
BUN: 14 mg/dL (ref 6–23)
CO2: 26 mEq/L (ref 19–32)
Calcium: 9.7 mg/dL (ref 8.4–10.5)
Chloride: 105 mEq/L (ref 96–112)
Creatinine, Ser: 0.9 mg/dL (ref 0.4–1.2)
GFR: 86.04 mL/min (ref >60.00)
Glucose, Bld: 99 mg/dL (ref 70–99)
Potassium: 4.1 mEq/L (ref 3.5–5.1)
Sodium: 141 mEq/L (ref 135–145)
Total Bilirubin: 0.4 mg/dL (ref 0.3–1.2)
Total Protein: 7.3 g/dL (ref 6.0–8.3)

## 2011-09-06 LAB — TSH: TSH: 2.03 u[IU]/mL (ref 0.35–5.50)

## 2011-09-06 LAB — CBC WITH DIFFERENTIAL/PLATELET
Basophils Absolute: 0 10*3/uL (ref 0.0–0.1)
Basophils Relative: 0.6 % (ref 0.0–3.0)
Eosinophils Absolute: 0 10*3/uL (ref 0.0–0.7)
Eosinophils Relative: 0.9 % (ref 0.0–5.0)
HCT: 33.4 % — ABNORMAL LOW (ref 36.0–46.0)
Hemoglobin: 11.3 g/dL — ABNORMAL LOW (ref 12.0–15.0)
Lymphocytes Relative: 53.5 % — ABNORMAL HIGH (ref 12.0–46.0)
Lymphs Abs: 1.1 10*3/uL (ref 0.7–4.0)
MCHC: 33.9 g/dL (ref 30.0–36.0)
MCV: 92.2 fl (ref 78.0–100.0)
Monocytes Absolute: 0.3 10*3/uL (ref 0.1–1.0)
Monocytes Relative: 15.5 % — ABNORMAL HIGH (ref 3.0–12.0)
Neutro Abs: 0.6 10*3/uL — ABNORMAL LOW (ref 1.4–7.7)
Neutrophils Relative %: 29.5 % — ABNORMAL LOW (ref 43.0–77.0)
Platelets: 150 10*3/uL (ref 150.0–400.0)
RBC: 3.63 Mil/uL — ABNORMAL LOW (ref 3.87–5.11)
RDW: 13.8 % (ref 11.5–14.6)
WBC: 2.1 10*3/uL — ABNORMAL LOW (ref 4.5–10.5)

## 2011-09-06 NOTE — Assessment & Plan Note (Signed)
Unclear source, check basic labs and consider OSA eval in future.  She agrees.  ddx d/w pt.  Okay for outpatient fu.  No TMG on exam.

## 2011-09-06 NOTE — Patient Instructions (Addendum)
Go to the lab on the way out.  We'll contact you with your lab report. Take care.   

## 2011-09-06 NOTE — Progress Notes (Signed)
"  I'm tired, I can sleep all day, multiple days in a row."  Coming home from work and immediately going to sleep w/o eating dinner.  Skipped church to sleep, this is very unusual.  Not improved if going to bed early.  Going on for ~1 month.  She thought it would resolve on its own.  She's working her regular job and a few hours extra part time (but still less than her normal routine).  Occ light headed but not dizzy; can happen at rest or with position changes.  No trigger known, irregular onset and is very brief.  No CP, SOB, BLE edema (except for trace edema at baseline in R leg).  Not nodding off during the day.  No known snoring, but this is possible.  Not waking gasping for air.  No med changes.  No dark or red stools but had some diarrhea treated with OTC meds.   Mother has been ill recently, improved recently.    Meds, vitals, and allergies reviewed.   ROS: See HPI.  Otherwise, noncontributory.  GEN: nad, alert and oriented HEENT: mucous membranes moist NECK: supple w/o LA CV: rrr. PULM: ctab, no inc wob ABD: soft, +bs EXT: no edema SKIN: no acute rash

## 2011-10-20 ENCOUNTER — Telehealth: Payer: Self-pay

## 2011-10-20 NOTE — Telephone Encounter (Signed)
Pt going on cruise for 5 days and request patches to prevent motion sickness [facility] [address].Please advise.

## 2011-10-21 MED ORDER — SCOPOLAMINE 1 MG/3DAYS TD PT72: 1.0000 | MEDICATED_PATCH | TRANSDERMAL | Status: DC

## 2011-10-21 NOTE — Telephone Encounter (Signed)
Sent. 1 patch will last 3 days.  It usually comes in a box of 4.  Tell her I hope she enjoys the trip.

## 2011-10-21 NOTE — Telephone Encounter (Signed)
LMOVM

## 2011-10-23 ENCOUNTER — Other Ambulatory Visit: Payer: Self-pay | Admitting: Family Medicine

## 2011-10-24 NOTE — Telephone Encounter (Signed)
Electronic refill request

## 2011-10-25 NOTE — Telephone Encounter (Signed)
Sent!

## 2011-10-27 ENCOUNTER — Other Ambulatory Visit: Payer: Self-pay | Admitting: Obstetrics and Gynecology

## 2011-10-27 DIAGNOSIS — Z1231 Encounter for screening mammogram for malignant neoplasm of breast: Secondary | ICD-10-CM

## 2011-10-27 DIAGNOSIS — M810 Age-related osteoporosis without current pathological fracture: Secondary | ICD-10-CM

## 2011-12-05 ENCOUNTER — Ambulatory Visit
Admission: RE | Admit: 2011-12-05 | Discharge: 2011-12-05 | Disposition: A | Discharge status: Home / Self Care | Payer: 59 | Source: Ambulatory Visit | Attending: Obstetrics and Gynecology | Admitting: Obstetrics and Gynecology

## 2011-12-05 DIAGNOSIS — M810 Age-related osteoporosis without current pathological fracture: Secondary | ICD-10-CM

## 2011-12-05 DIAGNOSIS — Z1231 Encounter for screening mammogram for malignant neoplasm of breast: Secondary | ICD-10-CM

## 2012-01-22 ENCOUNTER — Other Ambulatory Visit: Payer: Self-pay | Admitting: Family Medicine

## 2012-02-17 ENCOUNTER — Ambulatory Visit (INDEPENDENT_AMBULATORY_CARE_PROVIDER_SITE_OTHER): Payer: 59 | Admitting: Family Medicine

## 2012-02-17 ENCOUNTER — Encounter: Payer: Self-pay | Admitting: Family Medicine

## 2012-02-17 VITALS — BP 142/88 | HR 79 | Temp 98.2°F | Wt 147.8 lb

## 2012-02-17 DIAGNOSIS — M25519 Pain in unspecified shoulder: Secondary | ICD-10-CM

## 2012-02-17 DIAGNOSIS — R51 Headache: Secondary | ICD-10-CM

## 2012-02-17 DIAGNOSIS — R519 Headache, unspecified: Secondary | ICD-10-CM

## 2012-02-17 MED ORDER — TRAMADOL HCL 50 MG PO TABS: 50.0000 mg | ORAL_TABLET | Freq: Three times a day (TID) | ORAL | Status: DC | PRN

## 2012-02-17 NOTE — Patient Instructions (Signed)
Use the exercises and call me if not improved.   Take the tramadol for the pain if needed.  It can make you drowsy.   I'll be in touch about the facial symptoms.

## 2012-02-17 NOTE — Progress Notes (Signed)
R shoulder pain after baby sitting a child about 1 month ago, she picked up a 59 year old child.  Pain didn't start immediately.  Throbbing pain, trouble sleeping on the R side.  Taking advil w/o much help.  It isn't getting worse, but it isn't getting better.  Pain reaching above her head but more when sitting and trying to work with her hands in front of her. No elbow or wrist pain.  No L sided pain.  Grip is still wnl.  She had some trouble like this prev but that had resolved with a better chair and positioning at work.    She had some facial sx, discomfort and numbness on the L side of the face, was waking her at night.  Started on flonase and amoxil w/o much relief.  She just finished the amoxil.  She had seen ENT prev and it was thought she had TMJ, but treatment didn't help.  She has seen dental clinic with neg eval per patient report.  She's using a humidifier and nasal saline.  This has been going on for years now.    Meds, vitals, and allergies reviewed.   ROS: See HPI.  Otherwise, noncontributory.  nad ncat Tm wnl x2 Nasal exam w/o erythema OP wnl Neck supple, no LA rrr ctab R shoulder with normal ROM but pain on abduction.  Pain with int>ext rotation.  + impingement.  No arm drop. scap assist noted. Distally nv intact Skin on face with normal appearance and normal motor function on the face noted.

## 2012-02-18 ENCOUNTER — Telehealth: Payer: Self-pay | Admitting: Family Medicine

## 2012-02-18 DIAGNOSIS — R519 Headache, unspecified: Secondary | ICD-10-CM

## 2012-02-18 DIAGNOSIS — M25519 Pain in unspecified shoulder: Secondary | ICD-10-CM | POA: Insufficient documentation

## 2012-02-18 NOTE — Assessment & Plan Note (Signed)
Possible trigeminal sx.  It is mainly on the L V2 distribution.  We'll offer neuro eval.

## 2012-02-18 NOTE — Telephone Encounter (Signed)
Call pt.  I thought about the pain on the L side of her face.  She could have trigeminal neuralgia.  It would be reasonable to take to neuro about this first, before trying any meds.  If she consents, notify me and I'll put in the referral.  Thanks.

## 2012-02-18 NOTE — Assessment & Plan Note (Signed)
Cuff sx likely.  D/w pt about anatomy and home exercise program.  If not improved, then notify me.  She agrees.

## 2012-02-20 NOTE — Telephone Encounter (Signed)
Advised patient and she is willing to see Neurology.  Please put in referral.

## 2012-02-21 NOTE — Telephone Encounter (Signed)
Done

## 2012-03-05 ENCOUNTER — Encounter: Payer: Self-pay | Admitting: Neurology

## 2012-03-05 ENCOUNTER — Ambulatory Visit (INDEPENDENT_AMBULATORY_CARE_PROVIDER_SITE_OTHER): Payer: 59 | Admitting: Neurology

## 2012-03-05 VITALS — BP 148/90 | HR 92 | Temp 98.2°F | Resp 20 | Wt 149.0 lb

## 2012-03-05 DIAGNOSIS — D72819 Decreased white blood cell count, unspecified: Secondary | ICD-10-CM

## 2012-03-05 DIAGNOSIS — G500 Trigeminal neuralgia: Secondary | ICD-10-CM

## 2012-03-05 HISTORY — DX: Trigeminal neuralgia: G50.0

## 2012-03-05 LAB — COMPREHENSIVE METABOLIC PANEL
ALT: 27 U/L (ref 0–35)
AST: 29 U/L (ref 0–37)
Albumin: 3.9 g/dL (ref 3.5–5.2)
Alkaline Phosphatase: 79 U/L (ref 39–117)
BUN: 11 mg/dL (ref 6–23)
CO2: 28 mEq/L (ref 19–32)
Calcium: 9.1 mg/dL (ref 8.4–10.5)
Chloride: 104 mEq/L (ref 96–112)
Creatinine, Ser: 0.9 mg/dL (ref 0.4–1.2)
GFR: 81.55 mL/min (ref >60.00)
Glucose, Bld: 102 mg/dL — ABNORMAL HIGH (ref 70–99)
Potassium: 3.3 mEq/L — ABNORMAL LOW (ref 3.5–5.1)
Sodium: 140 mEq/L (ref 135–145)
Total Bilirubin: 0.4 mg/dL (ref 0.3–1.2)
Total Protein: 6.7 g/dL (ref 6.0–8.3)

## 2012-03-05 LAB — CBC WITH DIFFERENTIAL/PLATELET
Basophils Absolute: 0 10*3/uL (ref 0.0–0.1)
Basophils Relative: 1 % (ref 0.0–3.0)
Eosinophils Absolute: 0 10*3/uL (ref 0.0–0.7)
Eosinophils Relative: 0.8 % (ref 0.0–5.0)
HCT: 31.6 % — ABNORMAL LOW (ref 36.0–46.0)
Hemoglobin: 10.7 g/dL — ABNORMAL LOW (ref 12.0–15.0)
Lymphocytes Relative: 58.1 % — ABNORMAL HIGH (ref 12.0–46.0)
Lymphs Abs: 1.4 10*3/uL (ref 0.7–4.0)
MCHC: 33.8 g/dL (ref 30.0–36.0)
MCV: 87.9 fl (ref 78.0–100.0)
Monocytes Absolute: 0.3 10*3/uL (ref 0.1–1.0)
Monocytes Relative: 11.2 % (ref 3.0–12.0)
Neutro Abs: 0.7 10*3/uL — ABNORMAL LOW (ref 1.4–7.7)
Neutrophils Relative %: 28.9 % — ABNORMAL LOW (ref 43.0–77.0)
Platelets: 170 10*3/uL (ref 150.0–400.0)
RBC: 3.6 Mil/uL — ABNORMAL LOW (ref 3.87–5.11)
RDW: 14.9 % — ABNORMAL HIGH (ref 11.5–14.6)
WBC: 2.4 10*3/uL — ABNORMAL LOW (ref 4.5–10.5)

## 2012-03-05 MED ORDER — OXCARBAZEPINE 300 MG PO TABS: 300.0000 mg | ORAL_TABLET | Freq: Two times a day (BID) | ORAL | Status: DC

## 2012-03-05 MED ORDER — OXCARBAZEPINE 150 MG PO TABS: 150.0000 mg | ORAL_TABLET | Freq: Two times a day (BID) | ORAL | Status: DC

## 2012-03-05 NOTE — Progress Notes (Signed)
Karen Saunders was seen today in neurologic consultation at the request of Crawford Givens, MD.  The consultation is for the evaluation of facial pain and to rule out trigeminal neuralgia.  She is previously had an ENT evaluation as well as a dental evaluation, both of which were unrevealing.  The patient reports that she has had this L facial pain for over a year.  The patient reports a gradual onset of the pain and she attributed it to sinuses and she got nose sprays and antibiotics for quite some time without relief.  She then had the ENT eval that was negative.   The pain is intermittent; it generally will wake her up at night.  She may go up to a month or two without the pain.  She desribes it as a stabbing pain.  It involves the entire L check and maybe a "strain" in the eye but no eye pain.  Laying down makes it worse.  Weather changes do not seem to affect the pain, nor does wind or brushing teeth.       Neuroimaging has  previously been performed.  An MRI of the brain was last performed in 2008, which was essentially unremarkable when I reviewed it.  The official results are as follows:  MRI BRAIN WITHOUT CONTRAST: Technique:  Multiplanar and multiecho pulse sequences of the brain and surrounding structures were obtained according to standard protocol without IV contrast. Comparison:  CT sinus 06/02/06.   Findings:  No restricted diffusion.  No midline shift, ventriculomegaly, mass effect, extraaxial collection, or intracranial hemorrhage.  There is mild cerebellar tonsillar ectopia, 3 mm, without evidence of Chiari I malformation.  There is normal appearance of the pons and suprasellar structures without evidence of intracranial hypotension.  No contrast was administered.  Normal pituitary.  There are occasional small foci of subcortical and central white matter T2-/FLAIR signal abnormality.  Basal ganglia, brainstem, and cerebellum have normal signal.  No focal encephalomalacia.  Incidental  ectasia of the vertebrobasilar junction and basilar artery.  Normal major vascular flow voids.  Normal orbits, paranasal sinuses, and mastoids.  IMPRESSION: 1.  No acute intracranial abnormality. 2.  Mild cerebellar tonsillar ectopia without strong evidence of intracranial hypotension.  Still, this can be a source for chronic headaches.  Correlation with CSF opening pressure would be most sensitive for further evaluation. 3.  Nonspecific scattered subcortical and central white matter signal abnormality.  This is most commonly related to chronic small vessel ischemic disease but can also be seen as sequelae of migraines, hypercoagulable state, less likely demyelination, or vasculitis.    PREVIOUS MEDICATIONS: none  ALLERGIES:   Allergies  Allergen Reactions  . Amoxicillin-Pot Clavulanate     REACTION: Rash on tongue  . Ibandronate Sodium     REACTION: GI side effects  . Iohexol      Desc: HIVES,NASAL CONGESTION DURING A CARDIAC CATH. REQUIRES PRE-MEDS.   . Raloxifene     REACTION: GI upset  . Sulfonamide Derivatives     REACTION: itching  . Zoledronic Acid     REACTION: unable to take this as she was intolerant of pre-tx calcium and vitamin D    CURRENT MEDICATIONS:  Current Outpatient Prescriptions on File Prior to Visit  Medication Sig Dispense Refill  . amLODipine (NORVASC) 5 MG tablet Take 5 mg by mouth daily.      Marland Kitchen aspirin 81 MG tablet Take 81 mg by mouth daily.        Marland Kitchen atorvastatin (LIPITOR)  80 MG tablet Take 80 mg by mouth daily.      Marland Kitchen dexlansoprazole (DEXILANT) 60 MG capsule Take 60 mg by mouth daily.      Marland Kitchen dicyclomine (BENTYL) 10 MG capsule TAKE 1 CAPSULE BY MOUTH BEFORE MEALS AND AT BEDTIME  120 capsule  12  . ezetimibe (ZETIA) 10 MG tablet Take 10 mg by mouth daily.        Marland Kitchen levocetirizine (XYZAL) 5 MG tablet TAKE 1 TABLET BY MOUTH EVERY DAY FOR ALLERGIES  30 tablet  9  . metoprolol (LOPRESSOR) 50 MG tablet Take 50 mg by mouth 2 (two) times daily.        .  pantoprazole (PROTONIX) 40 MG tablet TAKE 1 TABLET TWICE A DAY PRIOR TO A MEAL  180 tablet  1  . Pediatric Multivit-Minerals-C (RA GUMMY VITAMINS & MINERALS PO) Take 1 ampule by mouth daily.        . traMADol (ULTRAM) 50 MG tablet Take 1 tablet (50 mg total) by mouth every 8 (eight) hours as needed for pain.  30 tablet  1  . valsartan-hydrochlorothiazide (DIOVAN-HCT) 160-12.5 MG per tablet Take 1 tablet by mouth daily.         No current facility-administered medications on file prior to visit.    PAST MEDICAL HISTORY:   Past Medical History  Diagnosis Date  . Hypertension   . Hyperlipidemia   . Osteoporosis 11/18/2004  . Migraine     Unsp w/o intract w/o status migrainosus  . IBS (irritable bowel syndrome)   . CAD (coronary artery disease)     Dr. Donnie Aho with Cards  . Leukopenia     with prev unremarkable heme eval ~2011  . Anemia     with prev unremarkable heme eval ~2011  . Shingles     PAST SURGICAL HISTORY:   Past Surgical History  Procedure Laterality Date  . Stress cardiolite  09/11/2008    Probably normal with breast attenuation noted but no evidence of ischemia  . Abdominal hysterectomy  2002  . Tonsillectomy  ~ 1965  . Coronary artery bypass graft  2002    SOCIAL HISTORY:   History   Social History  . Marital Status: Divorced    Spouse Name: N/A    Number of Children: 1  . Years of Education: N/A   Occupational History  . MEDICAID [employer]  . 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 Smoking status: Never Smoker   . Smokeless tobacco: Never Used  . Alcohol Use: No  . Drug Use: No  . Sexually Active: Not on file   Other Topics Concern  . Not on file   Social History Narrative   From [place].   Education:  14 years   Regular Exercise:  Yes   Enjoys time at church and jazz music    FAMILY HISTORY:   Family Status  Relation Status Death Age  . Mother Alive     DJD  . Father  Deceased 12    MI, CVA  . Sister Alive     2, healthy  . Brother Alive     healthy  . Child Alive     1, alive and well    ROS:  A complete 10 system review of systems was obtained and was unremarkable apart from what is mentioned above.  PHYSICAL EXAMINATION:    VITALS:   Filed Vitals:   03/05/12  0907  BP: 148/90  Pulse: 92  Temp: 98.2 F (36.8 C)  Resp: 20  Weight: 149 lb (67.586 kg)    GEN:  Normal appears female in no acute distress.  Appears stated age. HEENT:  Normocephalic, atraumatic. The mucous membranes are moist. The superficial temporal arteries are without ropiness or tenderness. Cardiovascular: Regular rate and rhythm. Lungs: Clear to auscultation bilaterally. Neck/Heme: There are no carotid bruits noted bilaterally.  NEUROLOGICAL: Orientation:  The patient is alert and oriented x 3.  Fund of knowledge is appropriate.  Recent and remote memory intact.  Attention span and concentration normal.  Repeats and names without difficulty. Cranial nerves: There is good facial symmetry. The pupils are equal round and reactive to light bilaterally. Fundoscopic exam reveals clear disc margins bilaterally. Extraocular muscles are intact and visual fields are full to confrontational testing. Speech is fluent and clear. Soft palate rises symmetrically and there is no tongue deviation. Hearing is intact to conversational tone. Tone: Tone is good throughout. Sensation: Sensation is intact to light touch and pinprick throughout (facial, trunk, extremities). Vibration is intact at the bilateral big toe. There is no extinction with double simultaneous stimulation. There is no sensory dermatomal level identified. Coordination:  The patient has no difficulty with RAM's or FNF bilaterally. Motor: Strength is 5/5 in the bilateral upper and lower extremities.  Shoulder shrug is equal and symmetric. There is no pronator drift.  There are no fasciculations noted. DTR's: Deep tendon reflexes  are 2/4 at the bilateral biceps, triceps, brachioradialis, patella and achilles.  Plantar responses are downgoing bilaterally. Gait and Station: The patient is able to ambulate without difficulty. The patient is able to heel toe walk without any difficulty. The patient is able to ambulate in a tandem fashion. The patient is able to stand in the Romberg position.  LABS:  Lab Results  Component Value Date   WBC 2.1 Repeated and verified X2.* 09/06/2011   HGB 11.3* 09/06/2011   HCT 33.4* 09/06/2011   MCV 92.2 09/06/2011   PLT 150.0 09/06/2011   Lab Results  Component Value Date   TSH 2.03 09/06/2011     Chemistry      Component Value Date/Time   NA 141 09/06/2011 0910   K 4.1 09/06/2011 0910   CL 105 09/06/2011 0910   CO2 26 09/06/2011 0910   BUN 14 09/06/2011 0910   CREATININE 0.9 09/06/2011 0910      Component Value Date/Time   CALCIUM 9.7 09/06/2011 0910   ALKPHOS 74 09/06/2011 0910   AST 36 09/06/2011 0910   ALT 28 09/06/2011 0910   BILITOT 0.4 09/06/2011 0910         IMPRESSION/PLAN:  1. Left facial pain, probably representing trigeminal neuralgia.  -We discussed the diagnosis as well as pathophysiology of the disease.  We discussed treatment options as well as prognostic indicators.  Patient education was provided.  -We are going to initiate Trileptal: 150 mg twice a day for a week and then increase to 300 mg twice a day.  Risks, benefits, side effects and alternative therapies were discussed.  The opportunity to ask questions was given and they were answered to the best of my ability.  The patient expressed understanding and willingness to follow the outlined treatment protocols.  -We will repeat her BMP in one month to make sure that the Trileptal did not cause hyponatremia.  -We will schedule an MRI/MRA of the brain to make sure that there is no tumor or aneurysm near  the V2 distribution of the Trigeminal nerve.  -I will do some baseline lab work today. 2.  History of  leukopenia.  -This has been stable over the course of time and the patient had a hematologic evaluation in 2011 that was unremarkable. 3.  I asked the patient to call me with any questions or concerns before her next visit.  If she is doing well, then we'll plan on seeing her back in the next 3 months.  I asked her to call me for the results of her MRI of the brain.

## 2012-03-05 NOTE — Patient Instructions (Addendum)
1.  We will get your MRI brain and MRA scheduled 2.  We will get labs today and in one month 3.  Start trileptal - 150 mg twice a day for a week and then increase to 300 mg twice a day   Your MRI/MRA is scheduled for Thursday, Feb. 27 at 1:00pm.  Please arrive to [facility], first floor admitting by 12:45pm.  [phone].

## 2012-03-06 ENCOUNTER — Encounter: Payer: Self-pay | Admitting: *Deleted

## 2012-03-06 ENCOUNTER — Telehealth: Payer: Self-pay

## 2012-03-06 NOTE — Telephone Encounter (Signed)
I called pt. She is only 0.2 points low and doesn't have a high K diet.  She typically runs in the low normal range.  She'll work on getting a higher K diet in the meantime.  It would be okay to recheck her labs per neuro in about 1 month.    Lugene please mail the handout to the patient.   Sent back to neuro as a FYI.  App neuro help.

## 2012-03-06 NOTE — Telephone Encounter (Signed)
Pt was sent to [facility] neurology, Dr Tat and pt was told to contact PCP about K being low Was 3.3. Pt's lab for Dr Tat is in chart. Pt request call back with instructions.Unable to reach pt to find out which pharmacy pt uses.Please advise.

## 2012-03-08 ENCOUNTER — Ambulatory Visit (HOSPITAL_COMMUNITY): Admission: RE | Admit: 2012-03-08 | Payer: 59 | Source: Ambulatory Visit

## 2012-03-08 ENCOUNTER — Ambulatory Visit (HOSPITAL_COMMUNITY)
Admission: RE | Admit: 2012-03-08 | Discharge: 2012-03-08 | Disposition: A | Discharge status: Home / Self Care | Payer: 59 | Source: Ambulatory Visit | Attending: Neurology | Admitting: Neurology

## 2012-03-08 ENCOUNTER — Other Ambulatory Visit: Payer: Self-pay | Admitting: Neurology

## 2012-03-08 DIAGNOSIS — G500 Trigeminal neuralgia: Secondary | ICD-10-CM | POA: Insufficient documentation

## 2012-03-08 DIAGNOSIS — R51 Headache: Secondary | ICD-10-CM | POA: Insufficient documentation

## 2012-03-08 DIAGNOSIS — Q048 Other specified congenital malformations of brain: Secondary | ICD-10-CM | POA: Insufficient documentation

## 2012-03-08 MED ORDER — GADOBENATE DIMEGLUMINE 529 MG/ML IV SOLN
14.0000 mL | Freq: Once | INTRAVENOUS | Status: AC
  Administered 2012-03-08: 14 mL via INTRAVENOUS

## 2012-04-02 ENCOUNTER — Ambulatory Visit: Payer: 59

## 2012-04-02 ENCOUNTER — Encounter: Payer: Self-pay | Admitting: *Deleted

## 2012-04-02 ENCOUNTER — Other Ambulatory Visit: Payer: 59

## 2012-04-02 NOTE — Telephone Encounter (Signed)
Mailed hand-out to patient.  It looks like she already has a lab appt with neuro today.  Is this her 1 month follow up from 03/05/12 or does she need to be scheduled again?

## 2012-04-02 NOTE — Telephone Encounter (Signed)
Neuro mentioned repeating BMET and that would be the f/u.

## 2012-04-06 ENCOUNTER — Encounter: Payer: Self-pay | Admitting: Family Medicine

## 2012-04-06 ENCOUNTER — Other Ambulatory Visit (INDEPENDENT_AMBULATORY_CARE_PROVIDER_SITE_OTHER): Payer: 59

## 2012-04-06 ENCOUNTER — Ambulatory Visit (INDEPENDENT_AMBULATORY_CARE_PROVIDER_SITE_OTHER): Payer: 59 | Admitting: Family Medicine

## 2012-04-06 ENCOUNTER — Other Ambulatory Visit: Payer: 59

## 2012-04-06 VITALS — BP 162/102 | HR 67 | Temp 98.3°F | Wt 150.5 lb

## 2012-04-06 DIAGNOSIS — E875 Hyperkalemia: Secondary | ICD-10-CM

## 2012-04-06 DIAGNOSIS — J069 Acute upper respiratory infection, unspecified: Secondary | ICD-10-CM

## 2012-04-06 LAB — CBC WITH DIFFERENTIAL/PLATELET
Basophils Absolute: 0 10*3/uL (ref 0.0–0.1)
Basophils Relative: 0.5 % (ref 0.0–3.0)
Eosinophils Absolute: 0 10*3/uL (ref 0.0–0.7)
Eosinophils Relative: 0.3 % (ref 0.0–5.0)
HCT: 30.6 % — ABNORMAL LOW (ref 36.0–46.0)
Hemoglobin: 10.3 g/dL — ABNORMAL LOW (ref 12.0–15.0)
Lymphocytes Relative: 32.9 % (ref 12.0–46.0)
Lymphs Abs: 1.4 10*3/uL (ref 0.7–4.0)
MCHC: 33.6 g/dL (ref 30.0–36.0)
MCV: 88.6 fl (ref 78.0–100.0)
Monocytes Absolute: 0.4 10*3/uL (ref 0.1–1.0)
Monocytes Relative: 10.2 % (ref 3.0–12.0)
Neutro Abs: 2.3 10*3/uL (ref 1.4–7.7)
Neutrophils Relative %: 56.1 % (ref 43.0–77.0)
Platelets: 145 10*3/uL — ABNORMAL LOW (ref 150.0–400.0)
RBC: 3.45 Mil/uL — ABNORMAL LOW (ref 3.87–5.11)
RDW: 14.2 % (ref 11.5–14.6)
WBC: 4.2 10*3/uL — ABNORMAL LOW (ref 4.5–10.5)

## 2012-04-06 LAB — COMPREHENSIVE METABOLIC PANEL
ALT: 34 U/L (ref 0–35)
AST: 40 U/L — ABNORMAL HIGH (ref 0–37)
Albumin: 3.7 g/dL (ref 3.5–5.2)
Alkaline Phosphatase: 99 U/L (ref 39–117)
BUN: 10 mg/dL (ref 6–23)
CO2: 27 mEq/L (ref 19–32)
Calcium: 9.2 mg/dL (ref 8.4–10.5)
Chloride: 102 mEq/L (ref 96–112)
Creatinine, Ser: 1 mg/dL (ref 0.4–1.2)
GFR: 77.58 mL/min (ref >60.00)
Glucose, Bld: 102 mg/dL — ABNORMAL HIGH (ref 70–99)
Potassium: 3.6 mEq/L (ref 3.5–5.1)
Sodium: 138 mEq/L (ref 135–145)
Total Bilirubin: 0.3 mg/dL (ref 0.3–1.2)
Total Protein: 7.3 g/dL (ref 6.0–8.3)

## 2012-04-06 MED ORDER — BENZONATATE 200 MG PO CAPS: 200.0000 mg | ORAL_CAPSULE | Freq: Three times a day (TID) | ORAL | Status: DC | PRN

## 2012-04-06 MED ORDER — DOXYCYCLINE HYCLATE 100 MG PO TABS: 100.0000 mg | ORAL_TABLET | Freq: Two times a day (BID) | ORAL | Status: DC

## 2012-04-06 NOTE — Telephone Encounter (Signed)
Patient in for an appt and already has appt for labs through Neuro.

## 2012-04-06 NOTE — Progress Notes (Signed)
Cough and cold symptoms.  2+ weeks.  Had been seen at [facility] walk in clinic.  Given hydrocodone cough syrup but still coughing at night.  Had lost her voice in the interval.  Taking nyquil in the interval.  Had been out of work for a few days.  Voice is still altered, but coming back. Still with cough and congestion. "I'm better than I was but not back to normal." Sick exposures at work.  No fevers recently.  Cough causes burning in the chest.  No sputum.  Not SOB.    R 3rd-5th fingers are stiff and flexed in each AM. She has some soreness in the AM but this gets better as the day goes on. Recently noted by patient.  Meds, vitals, and allergies reviewed.   ROS: See HPI.  Otherwise, noncontributory.  GEN: nad, alert and oriented HEENT: mucous membranes moist, tm w/o erythema, nasal exam w/o erythema, clear discharge noted,  OP with cobblestoning NECK: supple w/o LA CV: rrr.   PULM: ctab, no inc wob EXT: no edema SKIN: no acute rash Hands with normal inspection and normal ROM at the MCPs/IPs.

## 2012-04-06 NOTE — Patient Instructions (Signed)
Use the tessalon for the cough and start the doxycycline if you don't improve.   If the finger pain doesn't resolve, then notify me.  Take care.

## 2012-04-06 NOTE — Telephone Encounter (Signed)
LMOVM at work asking if f/u labs have been scheduled through Neuro or if we need to schedule them here.

## 2012-04-08 DIAGNOSIS — J069 Acute upper respiratory infection, unspecified: Secondary | ICD-10-CM | POA: Insufficient documentation

## 2012-04-08 NOTE — Assessment & Plan Note (Signed)
Nontoxic, lungs clear, improving some per patient.  Hold rx for doxy and start if sx don't continue to improve. She agrees. Supportive tx in meantime. F/u prn.  She'll notify me if the hand sx continue. Unremarkable hand exam.

## 2012-04-10 ENCOUNTER — Encounter: Payer: Self-pay | Admitting: Family Medicine

## 2012-04-18 ENCOUNTER — Encounter: Payer: Self-pay | Admitting: Family Medicine

## 2012-04-18 ENCOUNTER — Telehealth: Payer: Self-pay | Admitting: Family Medicine

## 2012-04-18 DIAGNOSIS — R059 Cough, unspecified: Secondary | ICD-10-CM

## 2012-04-18 DIAGNOSIS — R05 Cough: Secondary | ICD-10-CM

## 2012-04-18 MED ORDER — HYDROCODONE-HOMATROPINE 5-1.5 MG/5ML PO SYRP: 5.0000 mL | ORAL_SOLUTION | Freq: Three times a day (TID) | ORAL | Status: DC | PRN

## 2012-04-18 NOTE — Telephone Encounter (Signed)
Advised patient as instructed.  She is coming here on Friday for CXR.  Hycodan called to [facility].

## 2012-04-18 NOTE — Telephone Encounter (Signed)
Please call pt.  We need to xray her chest.  See if this can be set up in [place].  The order is in.   Please call in hycodan in meantime.  Sedation caution.  Thanks.

## 2012-04-18 NOTE — Telephone Encounter (Signed)
I'll await the CXR.

## 2012-04-20 ENCOUNTER — Ambulatory Visit (INDEPENDENT_AMBULATORY_CARE_PROVIDER_SITE_OTHER)
Admission: RE | Admit: 2012-04-20 | Discharge: 2012-04-20 | Disposition: A | Discharge status: Home / Self Care | Payer: 59 | Source: Ambulatory Visit | Attending: Family Medicine | Admitting: Family Medicine

## 2012-04-20 DIAGNOSIS — R05 Cough: Secondary | ICD-10-CM

## 2012-04-20 DIAGNOSIS — R059 Cough, unspecified: Secondary | ICD-10-CM

## 2012-05-04 ENCOUNTER — Ambulatory Visit (INDEPENDENT_AMBULATORY_CARE_PROVIDER_SITE_OTHER): Payer: 59 | Admitting: Family Medicine

## 2012-05-04 ENCOUNTER — Encounter: Payer: Self-pay | Admitting: Family Medicine

## 2012-05-04 ENCOUNTER — Ambulatory Visit (INDEPENDENT_AMBULATORY_CARE_PROVIDER_SITE_OTHER)
Admission: RE | Admit: 2012-05-04 | Discharge: 2012-05-04 | Disposition: A | Discharge status: Home / Self Care | Payer: 59 | Source: Ambulatory Visit | Attending: Family Medicine | Admitting: Family Medicine

## 2012-05-04 VITALS — BP 148/100 | HR 75 | Temp 98.0°F | Wt 146.5 lb

## 2012-05-04 DIAGNOSIS — M79609 Pain in unspecified limb: Secondary | ICD-10-CM

## 2012-05-04 DIAGNOSIS — M79644 Pain in right finger(s): Secondary | ICD-10-CM

## 2012-05-04 DIAGNOSIS — M25511 Pain in right shoulder: Secondary | ICD-10-CM

## 2012-05-04 DIAGNOSIS — M25519 Pain in unspecified shoulder: Secondary | ICD-10-CM

## 2012-05-04 NOTE — Patient Instructions (Addendum)
Keep the splint on all the time and we'll be in touch.  Take care.

## 2012-05-04 NOTE — Progress Notes (Signed)
Her cough and L sided facial pain are both better.   R 3-5th finger pain.  R 5th finger is puffy at the DIP. She didn't remember any trauma. It aches on the dorsum of the hand along the 3rd - 5th MT.   Trouble reaching behind her with her R hand.  She had done shoulder exercises prev with some relief, but inc in pain more recently.  I had though this was cuff related prev. No trauma.  She has taken some tylenol and local heating pads. Mor pain with int than ext rotation.    Meds, vitals, and allergies reviewed.   ROS: See HPI.  Otherwise, noncontributory.  nad ncat rrr ctab R shoulder with int > ext rotation, +scap assist.  PROM>AROM at the shoulder due to pain.  No arm drop Normal ROM at the R elbow and wrist R hand and fingers with no erythema but slightly puffy along the 3rd-5th digits.   She has normal motor exam except for the inability to fully ext the R 5th DIP.

## 2012-05-06 DIAGNOSIS — M79646 Pain in unspecified finger(s): Secondary | ICD-10-CM | POA: Insufficient documentation

## 2012-05-06 NOTE — Assessment & Plan Note (Signed)
She didn't improve with home exercises. Will refer to ortho for this and the finger pain. See below.

## 2012-05-06 NOTE — Assessment & Plan Note (Addendum)
Xray reviewed. She has normal motor exam except for the inability to fully ext the R 5th DIP.   We splinted her today in extension. I want ortho input given the hand pain and the shoulder pain.  She doesn't recall any trauma to the hand. She is advised to stay in the splint for now.  She tolerated the splint.

## 2012-05-07 ENCOUNTER — Encounter: Payer: Self-pay | Admitting: Family Medicine

## 2012-05-11 ENCOUNTER — Encounter: Payer: Self-pay | Admitting: Family Medicine

## 2012-05-11 ENCOUNTER — Telehealth: Payer: Self-pay | Admitting: Family Medicine

## 2012-05-11 MED ORDER — HYDROCODONE-ACETAMINOPHEN 5-325 MG PO TABS
0.5000 | ORAL_TABLET | Freq: Four times a day (QID) | ORAL | Status: DC | PRN
Start: 2012-05-11 — End: 2012-09-13

## 2012-05-11 NOTE — Telephone Encounter (Signed)
Medication phoned to pharmacy.  

## 2012-05-11 NOTE — Telephone Encounter (Signed)
Please call in the hydrocodone rx.  Thanks.

## 2012-05-24 ENCOUNTER — Encounter: Payer: Self-pay | Admitting: Cardiology

## 2012-05-24 DIAGNOSIS — M5090 Cervical disc disorder, unspecified, unspecified cervical region: Secondary | ICD-10-CM | POA: Insufficient documentation

## 2012-05-24 DIAGNOSIS — Z8719 Personal history of other diseases of the digestive system: Secondary | ICD-10-CM | POA: Insufficient documentation

## 2012-05-24 DIAGNOSIS — Z8711 Personal history of peptic ulcer disease: Secondary | ICD-10-CM | POA: Insufficient documentation

## 2012-05-24 NOTE — Progress Notes (Signed)
Patient ID: Karen Saunders, female   DOB: [birth year], 57 y.o.   MRN: [id number] Karen Saunders, Karen Saunders  Date of visit:  05/24/2012 DOB:  [birth year]    Age:  56 yrs. Medical record number:  [id number]     Account number:  [id number] Primary Care Provider: Binnie Rail ____________________________ CURRENT DIAGNOSES  1. Edema  2. Dyspnea  3. CAD,Native  4. Hypertensive Heart Disease-benign Without Chf  5. Hyperlipidemia  6. Surgery-Aortocoronary Bypass Grafting  7. Personal History Of Peptic Ulcer Disease ____________________________ ALLERGIES  Altace, Cough  Augmentin ____________________________ MEDICATIONS  1. aspirin 81 mg tablet, effervescent, 1 p.o. q.d.  2. dicyclomine 10 mg capsule, ac  3. Xyzal 5 mg tablet, 1 p.o. daily  4. pantoprazole 40 mg tablet,delayed release (DR/EC), 1 p.o. daily  5. Colace 100 mg capsule, 1 p.o. daily  6. multivitamin tablet, 1 p.o. daily  7. Diovan HCT 160-12.5 mg tablet, 1 p.o. q.d.  8. tramadol 50 mg tablet, PRN  9. amlodipine 5 mg tablet, 1 p.o. daily  10. atorvastatin 80 mg tablet, 1 p.o. daily  11. Zetia 10 mg tablet, 1 p.o. daily  12. metoprolol tartrate 50 mg tablet, BID  13. oxcarbazepine 300 mg tablet, 1 p.o. daily ____________________________ CHIEF COMPLAINTS  Ankle edema ____________________________ HISTORY OF PRESENT ILLNESS  Patient seen early for evaluation of edema. She has had a difficult time with trigeminal neuralgia and was recently started on carbamazepine around a month ago. About 2 weeks ago she began to have some edema worse in her right leg and in her left leg. Her leg swelled up so bad that it was uncomfortable and she complains of some numbness and paresthesias in that leg also. She has not injured the leg and doesn't really have any real tenderness. She had some vague left-sided chest discomfort that did not sound like angina. She wished to be seen for this. She does not have any shortness of breath and denies PND, orthopnea or  claudication. ____________________________ PAST HISTORY  Past Medical Illnesses:  hyperlipidemia, hypertension, gastric ulcer 10/05, GERD, migraine headaches, irritable bowel syndrome, cervical disc disease;  Cardiovascular Illnesses:  CAD;  Surgical Procedures:  breast lumpectomy, hysterectomy May 2002, CABG w/LIMA and SVGs to Diag 1 OM 1 PDA PLR November 2002, Dr. Laneta Simmers;  Cardiology Procedures-Invasive:  cardiac cath (left) November 2002, cardiac cath (left) August 2003;  Cardiology Procedures-Noninvasive:  treadmill cardiolite September 2010;  Cardiac Cath Results:  normal Left main, occluded LAD, 80% stenosis proximal CFX, occluded PDA, 95% stenosis PLR, widely patent Diag 1 SVG, widely patent PDA PLR SVG, widely patent LAD LIMA graft;  LVEF of 77% documented via nuclear study on 09/11/2008,   ____________________________ CARDIO-PULMONARY TEST DATES EKG Date:  05/24/2012;   Cardiac Cath Date:  08/24/2001;  CABG: 12/01/2000;  Nuclear Study Date:  09/11/2008;  Chest Xray Date: 06/08/2007;   ____________________________ SOCIAL HISTORY Alcohol Use:  does not use alcohol;  Smoking:  nonsmoker;  Diet:  regular diet;  Lifestyle:  divorced;  Exercise:  exercises regularly;  Occupation:  [employer] [place];  Residence:  lives alone;   ____________________________ REVIEW OF SYSTEMS General:  denies recent weight change, fatique or change in exercise tolerance. Eyes: wears eye glasses/contact lenses Ears, Nose, Throat, Mouth:  facial pain Respiratory: denies dyspnea, cough, wheezing or hemoptysis. Cardiovascular:  please review HPI Genitourinary-Female: no dysuria, urgency, frequency, UTIs, or stress incontinenceNeurological:  denies headaches, stroke, or TIA  ____________________________ PHYSICAL EXAMINATION VITAL SIGNS  Blood Pressure:  162/70 Sitting, Right arm, regular cuff  , 148/74 Standing, Right arm and regular cuff   Pulse:  76/min. Weight:  147.00 lbs. Height:  66"BMI:  24  Constitutional:  pleasant African American female in no acute distress Skin:  warm and dry to touch, no apparent skin lesions, or masses noted. Head:  normocephalic, normal hair pattern, no masses or tenderness ENT:  ears, nose and throat reveal no gross abnormalities.  Dentition good. Neck:  supple, no masses, thyromegaly, JVD. Carotid pulses are full and equal bilaterally without bruits. Chest:  clear to auscultation and percussion, healed median sternotomy scar Cardiac:  regular rhythm, normal S1 and S2, No S3 or S4, no murmurs, gallops or rubs detected. Peripheral Pulses:  the femoral,dorsalis pedis, and posterior tibial pulses are full and equal bilaterally  Extremities & Back:  , well healed saphenous vein donor site RLE, 2+ edema right leg, trace edema left leg Neurological:  no gross motor or sensory deficits noted, affect appropriate, oriented x3. ____________________________ MOST RECENT LIPID PANEL 11/22/11  CHOL TOTL 167 mg/dl, LDL 79 calc, HDL 65 mg/dl, TRIGLYCER 454 mg/dl and CHOL/HDL 2.6 (Calc) ____________________________ IMPRESSIONS/PLAN  1. Asymmetric edema that involves the leg she had her previous saphenous harvesting in. I would wonder whether it is related to the initiation of carbamazepine. She did have some recent lab work done. 2. Coronary artery disease with previous bypass grafting 3. Hypertensive heart disease  Recommendations:  Obtain BNP. She is having some mild dyspnea at times. Obtain set of venous Dopplers to assess for thrombosis as the cause for the unilateral edema. Obtain echocardiogram to evaluate LV function. If the edema persists she may need to come off of the carbamazepine to see if it makes any difference. EKG shows LVH with ST changes but is unchanged from before. ____________________________ TODAYS ORDERS  1. Bil L.E. Venous Doppler DVT: First Available  2. 2D, color flow, doppler: First Available  3. BNP: Today  4. 12 Lead EKG: Today                        ____________________________ Cardiology Physician:  Darden Palmer MD [facility]

## 2012-05-25 ENCOUNTER — Encounter (INDEPENDENT_AMBULATORY_CARE_PROVIDER_SITE_OTHER): Payer: 59 | Admitting: Vascular Surgery

## 2012-05-25 DIAGNOSIS — M7989 Other specified soft tissue disorders: Secondary | ICD-10-CM

## 2012-05-28 ENCOUNTER — Encounter: Payer: Self-pay | Admitting: Cardiology

## 2012-05-31 ENCOUNTER — Telehealth: Payer: Self-pay | Admitting: Neurology

## 2012-05-31 NOTE — Telephone Encounter (Signed)
I received a call on my VM from Karen Saunders.  She stated that she wondered if trileptal causing swelling.  She just started swelling about 3-4 weeks ago in the legs.  Saw cardiology and they are doing tests that are pending but cardiology thought maybe it was trileptal.  Karen Saunders tried to d/c med but facial pain got worse so she went back on it.  She thinks that swelling decreased when off the medication but wants to stay on it because of pain.  [name], please tell Karen Saunders that I have not had this SE from trileptal before.  We should recheck her sodium to make sure.  Can she come in for an appt next week.  She is due for one anyway.

## 2012-05-31 NOTE — Telephone Encounter (Signed)
Left the patient a message to return my call.

## 2012-06-01 NOTE — Telephone Encounter (Signed)
Spoke with [employer]. Information given as per Dr. Arbutus Leas below. Her cardiology work up continues she reports; having an EKG next Wed. I did schedule a f/u with Dr. Arbutus Leas next Thursday am. No additional questions or concerns voiced at this time.

## 2012-06-07 ENCOUNTER — Ambulatory Visit: Payer: 59 | Admitting: Neurology

## 2012-06-19 ENCOUNTER — Ambulatory Visit (INDEPENDENT_AMBULATORY_CARE_PROVIDER_SITE_OTHER): Payer: 59 | Admitting: Neurology

## 2012-06-19 ENCOUNTER — Encounter: Payer: Self-pay | Admitting: Neurology

## 2012-06-19 VITALS — BP 124/82 | HR 76 | Temp 98.2°F | Resp 18 | Wt 147.0 lb

## 2012-06-19 DIAGNOSIS — G500 Trigeminal neuralgia: Secondary | ICD-10-CM

## 2012-06-19 NOTE — Progress Notes (Signed)
Karen Saunders was seen today in neurologic consultation at the request of Crawford Givens, MD.  The consultation is for the evaluation of facial pain and to rule out trigeminal neuralgia.  She previously had an ENT evaluation as well as a dental evaluation, both of which were unrevealing.  The patient reports that she has had this L facial pain for over a year.  The patient reports a gradual onset of the pain and she attributed it to sinuses and she got nose sprays and antibiotics for quite some time without relief.  She then had the ENT eval that was negative.   The pain is intermittent; it generally will wake her up at night.  She may go up to a month or two without the pain.  She desribes it as a stabbing pain.  It involves the entire L check and maybe a "strain" in the eye but no eye pain.  Laying down makes it worse.  Weather changes do not seem to affect the pain, nor does wind or brushing teeth.       Neuroimaging has  previously been performed.  An MRI of the brain was last performed in 2008, which was essentially unremarkable when I reviewed it.  The official results are as follows:  MRI BRAIN WITHOUT CONTRAST: Technique:  Multiplanar and multiecho pulse sequences of the brain and surrounding structures were obtained according to standard protocol without IV contrast. Comparison:  CT sinus 06/02/06.   Findings:  No restricted diffusion.  No midline shift, ventriculomegaly, mass effect, extraaxial collection, or intracranial hemorrhage.  There is mild cerebellar tonsillar ectopia, 3 mm, without evidence of Chiari I malformation.  There is normal appearance of the pons and suprasellar structures without evidence of intracranial hypotension.  No contrast was administered.  Normal pituitary.  There are occasional small foci of subcortical and central white matter T2-/FLAIR signal abnormality.  Basal ganglia, brainstem, and cerebellum have normal signal.  No focal encephalomalacia.  Incidental ectasia  of the vertebrobasilar junction and basilar artery.  Normal major vascular flow voids.  Normal orbits, paranasal sinuses, and mastoids.  IMPRESSION: 1.  No acute intracranial abnormality. 2.  Mild cerebellar tonsillar ectopia without strong evidence of intracranial hypotension.  Still, this can be a source for chronic headaches.  Correlation with CSF opening pressure would be most sensitive for further evaluation. 3.  Nonspecific scattered subcortical and central white matter signal abnormality.  This is most commonly related to chronic small vessel ischemic disease but can also be seen as sequelae of migraines, hypercoagulable state, less likely demyelination, or vasculitis.  06/19/12:  She follows up today.  Last visit, we started her on Trileptal.  This did seem to help.  Not long ago, she tried to wean off of it because she was having lower extremity edema and wondered if it was from the Trileptal.  However, the pain in the face came back when she was off the medicaation.  She did not notice any change in the edema when she was off of the medication, but per her previous notes from other providers, the edema was asymmetric in involving the leg with the prior saphenous vein graft.  She states that the R leg edema is better than it previously was, but seems to get worse as her legs are held antigravity throughout the day.    PREVIOUS MEDICATIONS: none  ALLERGIES:   Allergies  Allergen Reactions  . Amoxicillin-Pot Clavulanate     REACTION: Rash on tongue  . Ibandronate  Sodium     REACTION: GI side effects  . Iohexol      Desc: HIVES,NASAL CONGESTION DURING A CARDIAC CATH. REQUIRES PRE-MEDS.   . Raloxifene     REACTION: GI upset  . Sulfonamide Derivatives     REACTION: itching  . Zoledronic Acid     REACTION: unable to take this as she was intolerant of pre-tx calcium and vitamin D    CURRENT MEDICATIONS:  Current Outpatient Prescriptions on File Prior to Visit  Medication Sig Dispense  Refill  . amLODipine (NORVASC) 5 MG tablet Take 5 mg by mouth daily.      Marland Kitchen aspirin 81 MG tablet Take 81 mg by mouth daily.        Marland Kitchen atorvastatin (LIPITOR) 80 MG tablet Take 80 mg by mouth daily.      Marland Kitchen dexlansoprazole (DEXILANT) 60 MG capsule Take 60 mg by mouth daily.      Marland Kitchen dicyclomine (BENTYL) 10 MG capsule TAKE 1 CAPSULE BY MOUTH BEFORE MEALS AND AT BEDTIME  120 capsule  12  . ezetimibe (ZETIA) 10 MG tablet Take 10 mg by mouth daily.        Marland Kitchen levocetirizine (XYZAL) 5 MG tablet TAKE 1 TABLET BY MOUTH EVERY DAY FOR ALLERGIES  30 tablet  9  . metoprolol (LOPRESSOR) 50 MG tablet Take 50 mg by mouth 2 (two) times daily.        . Oxcarbazepine (TRILEPTAL) 300 MG tablet Take 1 tablet (300 mg total) by mouth 2 (two) times daily.  60 tablet  3  . pantoprazole (PROTONIX) 40 MG tablet TAKE 1 TABLET TWICE A DAY PRIOR TO A MEAL  180 tablet  1  . Pediatric Multivit-Minerals-C (RA GUMMY VITAMINS & MINERALS PO) Take 1 ampule by mouth daily.        . valsartan-hydrochlorothiazide (DIOVAN-HCT) 160-12.5 MG per tablet Take 1 tablet by mouth daily.        . fluticasone (FLONASE) 50 MCG/ACT nasal spray       . HYDROcodone-acetaminophen (NORCO/VICODIN) 5-325 MG per tablet Take 0.5-1 tablets by mouth every 6 (six) hours as needed for pain.  30 tablet  0   No current facility-administered medications on file prior to visit.    PAST MEDICAL HISTORY:   Past Medical History  Diagnosis Date  . Hypertension   . Hyperlipidemia   . Osteoporosis 11/18/2004  . Migraine     Unsp w/o intract w/o status migrainosus  . IBS (irritable bowel syndrome)   . CAD (coronary artery disease)     Dr. Donnie Aho with Cards  . Leukopenia     with prev unremarkable heme eval ~2011  . Anemia     with prev unremarkable heme eval ~2011  . Shingles   . Trigeminal neuralgia 03/05/2012  . History of gastric ulcer     2005     PAST SURGICAL HISTORY:   Past Surgical History  Procedure Laterality Date  . Stress cardiolite   09/11/2008    Probably normal with breast attenuation noted but no evidence of ischemia  . Abdominal hysterectomy  2002  . Tonsillectomy  ~ 1965  . Coronary artery bypass graft  2002    SOCIAL HISTORY:   History   Social History  . Marital Status: Divorced    Spouse Name: N/A    Number of Children: 1  . Years of Education: N/A   Occupational History  . MED[employer]ID [employer]  . 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 Smoking status: Never Smoker   . Smokeless tobacco: Never Used  . Alcohol Use: No  . Drug Use: No  . Sexually Active: Not on file   Other Topics Concern  . Not on file   Social History Narrative   From [place].   Education:  14 years   Regular Exercise:  Yes   Enjoys time at church and jazz music    FAMILY HISTORY:   Family Status  Relation Status Death Age  . Mother Alive     DJD  . Father Deceased 60    MI, CVA  . Sister Alive     2, healthy  . Brother Alive     healthy  . Child Alive     1, alive and well    ROS:  A complete 10 system review of systems was obtained and was unremarkable apart from what is mentioned above.  PHYSICAL EXAMINATION:    VITALS:   Filed Vitals:   06/19/12 0824  BP: 124/82  Pulse: 76  Temp: 98.2 F (36.8 C)  Resp: 18  Weight: 147 lb (66.679 kg)   Wt Readings from Last 3 Encounters:  06/19/12 147 lb (66.679 kg)  05/04/12 146 lb 8 oz (66.452 kg)  04/06/12 150 lb 8 oz (68.266 kg)    GEN:  Normal appears female in no acute distress.  Appears stated age. HEENT:  Normocephalic, atraumatic. The mucous membranes are moist. The superficial temporal arteries are without ropiness or tenderness. Cardiovascular: Regular rate and rhythm. Lungs: Clear to auscultation bilaterally. Neck/Heme: There are no carotid bruits noted bilaterally. Exts:  There is trace edema in the R leg only  NEUROLOGICAL: Orientation:  The patient is alert and oriented x 3.  Fund of  knowledge is appropriate.  Recent and remote memory intact.  Attention span and concentration normal.  Repeats and names without difficulty. Cranial nerves: There is good facial symmetry. The pupils are equal round and reactive to light bilaterally. Fundoscopic exam reveals clear disc margins bilaterally. Extraocular muscles are intact and visual fields are full to confrontational testing. Speech is fluent and clear. Soft palate rises symmetrically and there is no tongue deviation. Hearing is intact to conversational tone. Tone: Tone is good throughout. Sensation: Sensation is intact to light touch.  There is no extinction with double simultaneous stimulation. There is no sensory dermatomal level identified. Coordination:  The patient has no difficulty with RAM's or FNF bilaterally. Motor: Strength is 5/5 in the bilateral upper and lower extremities.  Shoulder shrug is equal and symmetric. There is no pronator drift.  There are no fasciculations noted. Gait and Station: The patient is able to ambulate without difficulty.   LABS:  Lab Results  Component Value Date   WBC 4.2* 04/06/2012   HGB 10.3* 04/06/2012   HCT 30.6* 04/06/2012   MCV 88.6 04/06/2012   PLT 145.0* 04/06/2012   Lab Results  Component Value Date   TSH 2.03 09/06/2011     Chemistry      Component Value Date/Time   NA 138 04/06/2012 1508   K 3.6 04/06/2012 1508   CL 102 04/06/2012 1508   CO2 27 04/06/2012 1508   BUN 10 04/06/2012 1508   CREATININE 1.0 04/06/2012 1508      Component Value Date/Time   CALCIUM 9.2 04/06/2012 1508   ALKPHOS 99 04/06/2012 1508   AST 40* 04/06/2012 1508   ALT 34 04/06/2012 1508  BILITOT 0.3 04/06/2012 1508         IMPRESSION/PLAN:  1. Left trigeminal neuralgia.  -We discussed the diagnosis as well as pathophysiology of the disease.  We discussed treatment options as well as prognostic indicators.  Patient education was provided.  -We are going to try and decrease the Trileptal: 150 mg twice a  day for a month and then if doing well, she can d/c the medication.  Risks, benefits, side effects and alternative therapies were discussed.  The opportunity to ask questions was given and they were answered to the best of my ability.  The patient expressed understanding and willingness to follow the outlined treatment protocols.  -I do not think that the intermittent, gravity dependent RLE edema is related to trileptal.  This is the leg where she previously had a saphenous v graft harvest and wonder if it is from that. 2.  History of leukopenia.  -This has been stable over the course of time and the patient had a hematologic evaluation in 2011 that was unremarkable. 3.  I asked the patient to call me with any questions or concerns before her next visit.  If she is doing well, then we'll plan on seeing her back in the next 4 months.

## 2012-06-19 NOTE — Patient Instructions (Addendum)
1.  Decrease your medication to 1/2 tablet twice per day for a month.  If you are still doing well, then you may stop the medication. 2.  We will see you back in four months.

## 2012-08-03 ENCOUNTER — Telehealth: Payer: Self-pay

## 2012-08-03 MED ORDER — FLUCONAZOLE 150 MG PO TABS: 150.0000 mg | ORAL_TABLET | Freq: Once | ORAL | Status: DC

## 2012-08-03 NOTE — Telephone Encounter (Signed)
I tried to call pt.  rx sent.  LMOVM w/o any confidential information.

## 2012-08-03 NOTE — Telephone Encounter (Signed)
Pt left v/m requesting oral medication for yeast infection to [facility]. No other information given. Unable to reach pt by any contact #.Please advise. Appears last CPX 11-09-2010 and noted Dr Senaida Ores did pelvic and breast exam.

## 2012-09-13 ENCOUNTER — Encounter: Payer: Self-pay | Admitting: Family Medicine

## 2012-09-13 ENCOUNTER — Ambulatory Visit (INDEPENDENT_AMBULATORY_CARE_PROVIDER_SITE_OTHER): Payer: 59 | Admitting: Family Medicine

## 2012-09-13 VITALS — BP 162/96 | HR 63 | Temp 98.0°F | Wt 144.8 lb

## 2012-09-13 DIAGNOSIS — R05 Cough: Secondary | ICD-10-CM

## 2012-09-13 DIAGNOSIS — R059 Cough, unspecified: Secondary | ICD-10-CM

## 2012-09-13 MED ORDER — HYDROCODONE-HOMATROPINE 5-1.5 MG/5ML PO SYRP: 2.5000 mL | ORAL_SOLUTION | Freq: Three times a day (TID) | ORAL | Status: DC | PRN

## 2012-09-13 NOTE — Patient Instructions (Addendum)
Use the cough medicine and this should gradually get better.  Take care.

## 2012-09-13 NOTE — Assessment & Plan Note (Signed)
Nontoxic, likely postviral cough that is slowly resolving with a component of ETD.  Would use hycodan prn for now with sedation caution and this should fully resolved.  ddx d/w pt.  Ctab.  She agrees.  F/u prn.

## 2012-09-13 NOTE — Progress Notes (Signed)
Sick contacts.  She was working overtime, still busy.  She is out of town next week.  Cough started about 4 weeks ago.  Taking OTC nyquil and mucinex D w/ a little relief.  The cough is some better, but "lingering."  No sputum, but "it's not completely dry, feels wet but nothing coughed up."  Her ears itch B.  No ear pain.  No fevers.  No rhinorrhea. No aches, no wheeze. No ST, but slight irritation from the cough.  Using cough drops.  Some HA, likely from the cough, frontal.  No help with tessalon for the cough.    She is off meds for trigeminal neuralgia.  Her shoulder is some better.  She had to delay PT.  Meds, vitals, and allergies reviewed.   ROS: See HPI.  Otherwise, noncontributory.  GEN: nad, alert and oriented HEENT: mucous membranes moist, tm w/o erythema but small amount of clear fluid behind the TMs B, nasal exam w/o erythema, mild clear discharge noted,  OP with mild cobblestoning, sinuses not ttp x4 NECK: supple w/o LA CV: rrr.   PULM: ctab, no inc wob EXT: no edema SKIN: no acute rash

## 2012-10-10 ENCOUNTER — Ambulatory Visit: Payer: Self-pay | Admitting: Podiatry

## 2012-10-17 ENCOUNTER — Ambulatory Visit (INDEPENDENT_AMBULATORY_CARE_PROVIDER_SITE_OTHER): Payer: 59 | Admitting: Podiatry

## 2012-10-17 ENCOUNTER — Ambulatory Visit (INDEPENDENT_AMBULATORY_CARE_PROVIDER_SITE_OTHER): Payer: 59

## 2012-10-17 ENCOUNTER — Encounter: Payer: Self-pay | Admitting: Podiatry

## 2012-10-17 VITALS — BP 160/92 | HR 63 | Resp 16 | Ht 63.0 in | Wt 125.0 lb

## 2012-10-17 DIAGNOSIS — M79675 Pain in left toe(s): Secondary | ICD-10-CM

## 2012-10-17 DIAGNOSIS — M79609 Pain in unspecified limb: Secondary | ICD-10-CM

## 2012-10-17 DIAGNOSIS — L600 Ingrowing nail: Secondary | ICD-10-CM

## 2012-10-17 NOTE — Progress Notes (Signed)
Subjective:     Patient ID: Karen Saunders, female   DOB: 1955-04-03, 57 y.o.   MRN: [id number]  Toe Pain    patient states that she traumatized her left foot one week ago. States she is not able to wear shoe gear comfortably and it has been swollen. States her ingrown toenail is healing fine.   Review of Systems  All other systems reviewed and are negative.       Objective:   Physical Exam  Nursing note and vitals reviewed. Cardiovascular: Intact distal pulses.    neurological intact bilateral mild edema outside of left foot in mostly the fifth toe. The area is painful to press. Ingrown toenail healed well with crust formation    Assessment:     Trauma to the left fifth toe and lateral foot with possible fracture. Well-healing ingrown toenail site left hallux.    Plan:     H&P performed. X-ray reviewed with patient. Advised patient on wide shoe gear and soaks to be continued. Healing process to take approximately 8 weeks.

## 2012-10-17 NOTE — Progress Notes (Signed)
N-aches L-5th toe left D-1 wk O-sudden C-hit toe, no swollen A-pressure, shoes T-soaking epsom, neosporin, motrin

## 2012-10-17 NOTE — Patient Instructions (Signed)
Call if any problems

## 2012-10-19 ENCOUNTER — Encounter: Payer: Self-pay | Admitting: Family Medicine

## 2012-10-23 ENCOUNTER — Ambulatory Visit: Payer: 59 | Admitting: Internal Medicine

## 2012-10-25 ENCOUNTER — Telehealth: Payer: Self-pay | Admitting: *Deleted

## 2012-10-25 NOTE — Telephone Encounter (Signed)
Pt complains of severe pain in little toe, states has taken Ibuprofen without relief.  0942am  Returned call left message [phone] and [phone] to call for an appt as soon as possible.

## 2012-11-01 ENCOUNTER — Ambulatory Visit: Payer: 59 | Admitting: Internal Medicine

## 2012-11-02 ENCOUNTER — Other Ambulatory Visit: Payer: Self-pay | Admitting: Family Medicine

## 2012-11-05 ENCOUNTER — Ambulatory Visit (INDEPENDENT_AMBULATORY_CARE_PROVIDER_SITE_OTHER): Payer: 59 | Admitting: Family Medicine

## 2012-11-05 ENCOUNTER — Encounter: Payer: Self-pay | Admitting: Family Medicine

## 2012-11-05 DIAGNOSIS — M79676 Pain in unspecified toe(s): Secondary | ICD-10-CM | POA: Insufficient documentation

## 2012-11-05 DIAGNOSIS — M79609 Pain in unspecified limb: Secondary | ICD-10-CM

## 2012-11-05 NOTE — Progress Notes (Signed)
L great toe pain.  Prev with L great toenail resection.  Started on lamisil in the meantime. Throbbing pain on L 1st toe, at the nail.  No recent trauma but wore a hard shoe to church yesterday.    Meds, vitals, and allergies reviewed.   ROS: See HPI.  Otherwise, noncontributory.  nad L foot with normal inspection and DP pulse but 1st toe with partial resection.  Doesn't appear infected, no fluctuant area.  Pain with pressure on the distal edge of the nail, pushing back to the nail bed

## 2012-11-05 NOTE — Assessment & Plan Note (Signed)
Likely nail bed compression from the hard shoes yesterday. Would wear soft shoes, soak and f/u prn.  Doesn't appear infected.

## 2012-11-05 NOTE — Patient Instructions (Signed)
Soak your foot, wear soft shoes and take ibuprofen with food for a short period of time.  Take care.

## 2012-11-12 ENCOUNTER — Encounter: Payer: Self-pay | Admitting: Family Medicine

## 2012-11-15 ENCOUNTER — Other Ambulatory Visit: Payer: Self-pay | Admitting: Family Medicine

## 2012-11-16 ENCOUNTER — Other Ambulatory Visit: Payer: Self-pay | Admitting: *Deleted

## 2012-11-16 ENCOUNTER — Encounter: Payer: Self-pay | Admitting: Family Medicine

## 2012-11-16 MED ORDER — DICYCLOMINE HCL 10 MG PO CAPS: 10.0000 mg | ORAL_CAPSULE | Freq: Three times a day (TID) | ORAL | Status: DC

## 2012-11-16 NOTE — Telephone Encounter (Signed)
My Chart refill request.  Please advise.  

## 2012-11-16 NOTE — Telephone Encounter (Signed)
Sent. Thanks.   

## 2012-12-12 ENCOUNTER — Other Ambulatory Visit: Payer: Self-pay | Admitting: Family Medicine

## 2012-12-18 ENCOUNTER — Encounter: Payer: Self-pay | Admitting: Cardiology

## 2012-12-18 NOTE — Progress Notes (Signed)
Patient ID: [name], female   DOB: 25-Feb-1955, 57 y.o.   MRN: [id number]  Karen Saunders, Karen Saunders  Date of visit:  12/18/2012 DOB:  Aug 28, 1955    Age:  57 yrs. Medical record number:  [id number]     Account number:  [id number] Primary Care Provider: Binnie Saunders ____________________________ CURRENT DIAGNOSES  1. CAD,Native  2. Hypertensive Heart Disease-benign Without Chf  3. Hyperlipidemia  4. Surgery-Aortocoronary Bypass Grafting  5. Personal History Of Peptic Ulcer Disease ____________________________ ALLERGIES  Amoxicillin, Intolerance-unknown  Ramipril, Intolerance-cough ____________________________ MEDICATIONS  1. dicyclomine 10 mg capsule, ac  2. Xyzal 5 mg tablet, 1 p.o. daily  3. pantoprazole 40 mg tablet,delayed release (DR/EC), 1 p.o. daily  4. Colace 100 mg capsule, 1 p.o. daily  5. multivitamin tablet, 1 p.o. daily  6. amlodipine 5 mg tablet, 1 p.o. daily  7. atorvastatin 80 mg tablet, 1 p.o. daily  8. Zetia 10 mg tablet, 1 p.o. daily  9. metoprolol tartrate 50 mg tablet, BID  10. Diovan HCT 160 mg-12.5 mg tablet, 1 p.o. q.d.  11. aspirin 81 mg chewable tablet, 1 p.o. daily ____________________________ CHIEF COMPLAINTS  Followup of CAD,Native ____________________________ HISTORY OF PRESENT ILLNESS Patient seen for cardiac followup. She has been doing well since she was previously here. The previous trigeminal neuralgia has resolved and the edema that she had in her leg has now resolved also. She denies angina and has no PND, orthopnea, syncope, palpitations, or claudication. Her lipids were evaluated today and show mild elevation of her LDL. ____________________________ PAST HISTORY  Past Medical Illnesses:  hyperlipidemia, hypertension, gastric ulcer 10/05, GERD, migraine headaches, irritable bowel syndrome, cervical disc disease;  Cardiovascular Illnesses:  CAD;  Surgical Procedures:  breast lumpectomy, hysterectomy May 2002, CABG w/LIMA and SVGs to Diag 1 OM 1 PDA PLR  November 2002, Dr. Laneta Saunders;  Cardiology Procedures-Invasive:  cardiac cath (left) November 2002, cardiac cath (left) August 2003;  Cardiology Procedures-Noninvasive:  treadmill cardiolite September 2010, echocardiogram May 2014;  Cardiac Cath Results:  normal Left main, occluded LAD, 80% stenosis proximal CFX, occluded PDA, 95% stenosis PLR, widely patent Diag 1 SVG, widely patent PDA PLR SVG, widely patent LAD LIMA graft;  Peripheral Vascular Procedures:  venous dopplers May 2014;  LVEF of 60% documented via echocardiogram on 06/07/2102,   ____________________________ CARDIO-PULMONARY TEST DATES EKG Date:  05/24/2012;   Cardiac Cath Date:  08/24/2001;  CABG: 12/01/2000;  Nuclear Study Date:  09/11/2008;  Echocardiography Date: 06/06/2012;  Chest Xray Date: 06/08/2007;   ____________________________ FAMILY HISTORY Brother -- Brother alive and well Father -- Father dead, Coronary Artery Disease, Diabetes mellitus Mother -- Mother alive and well Sister -- Sister alive and well Sister -- Sister alive and well ____________________________ SOCIAL HISTORY Alcohol Use:  does not use alcohol;  Smoking:  nonsmoker;  Diet:  regular diet;  Lifestyle:  divorced;  Exercise:  exercises regularly;  Occupation:  [employer] In [place];  Residence:  lives alone;   ____________________________ REVIEW OF SYSTEMS General:  denies recent weight change, fatique or change in exercise tolerance. Eyes: wears eye glasses/contact lenses Respiratory: denies dyspnea, cough, wheezing or hemoptysis. Cardiovascular:  please review HPI Genitourinary-Female: no dysuria, urgency, frequency, UTIs, or stress incontinenceNeurological:  denies headaches, stroke, or TIA  ____________________________ PHYSICAL EXAMINATION VITAL SIGNS  Blood Pressure:  150/80 Sitting, Right arm, regular cuff  , 146/74 Standing, Right arm, regular cuff   and 135/80 Retaken by MD   Pulse:  82/min. Weight:  146.00 lbs. Height:  66"BMI:  23  Constitutional:  pleasant African American female in no acute distress Skin:  warm and dry to touch, no apparent skin lesions, or masses noted. Head:  normocephalic, normal hair pattern, no masses or tenderness ENT:  ears, nose and throat reveal no gross abnormalities.  Dentition good. Neck:  supple, no masses, thyromegaly, JVD. Carotid pulses are full and equal bilaterally without bruits. Chest:  clear to auscultation and percussion, healed median sternotomy scar Cardiac:  regular rhythm, normal S1 and S2, No S3 or S4, no murmurs, gallops or rubs detected. Peripheral Pulses:  the femoral,dorsalis pedis, and posterior tibial pulses are full and equal bilaterally with no bruits auscultated. Extremities & Back:  well healed saphenous vein donor site RLE, no edema present Neurological:  no gross motor or sensory deficits noted, affect appropriate, oriented x3. ____________________________ MOST RECENT LIPID PANEL 12/18/12  CHOL TOTL 178 mg/dl, LDL 161 NM, HDL 44 mg/dl, TRIGLYCER 096 mg/dl and CHOL/HDL 4.0 (Calc) ____________________________ IMPRESSIONS/PLAN  1. Coronary artery disease with previous bypass grafting 2. Hyperlipidemia under treatment 3. Hypertensive heart disease  Recommendations:  She is on high-dose statin therapy and is getting along fairly well. Blood pressure under fair control today. Recommended followup in 6 months and call if problems. ____________________________ TODAYS ORDERS  1. Lipid Panel: Today  2. Return Visit: 6 months                       ____________________________ Cardiology Physician:  Darden Palmer MD [facility]

## 2012-12-29 ENCOUNTER — Other Ambulatory Visit: Payer: Self-pay | Admitting: Family Medicine

## 2013-01-04 ENCOUNTER — Other Ambulatory Visit: Payer: Self-pay | Admitting: Family Medicine

## 2013-01-19 ENCOUNTER — Other Ambulatory Visit: Payer: Self-pay | Admitting: Family Medicine

## 2013-06-18 ENCOUNTER — Encounter: Payer: Self-pay | Admitting: Cardiology

## 2013-06-18 NOTE — Progress Notes (Signed)
Patient ID: Karen Saunders, female   DOB: 03-07-1955, 58 y.o.   MRN: [id number]   Karen Saunders, Karen Saunders  Date of visit:  06/18/2013 DOB:  12/13/1955    Age:  57 yrs. Medical record number:  [id number]     Account number:  [id number] Primary Care Provider: Binnie Rail ____________________________ CURRENT DIAGNOSES  1. CAD,Native  2. Hypertensive Heart Disease-benign Without Chf  3. Hyperlipidemia  4. Surgery-Aortocoronary Bypass Grafting  5. Personal History Of Peptic Ulcer Disease ____________________________ ALLERGIES  Amoxicillin, Intolerance-unknown  Ramipril, Intolerance-cough ____________________________ MEDICATIONS  1. dicyclomine 10 mg capsule, ac  2. Xyzal 5 mg tablet, 1 p.o. daily  3. pantoprazole 40 mg tablet,delayed release (DR/EC), 1 p.o. daily  4. Colace 100 mg capsule, 1 p.o. daily  5. multivitamin tablet, 1 p.o. daily  6. aspirin 81 mg chewable tablet, 1 p.o. daily  7. Diovan HCT 160 mg-12.5 mg tablet, 1 p.o. q.d.  8. amlodipine 5 mg tablet, 1 p.o. daily  9. atorvastatin 80 mg tablet, 1 p.o. daily  10. Zetia 10 mg tablet, 1 p.o. daily  11. metoprolol tartrate 50 mg tablet, BID  12. valsartan 320 mg-hydrochlorothiazide 25 mg tablet, 1 p.o. daily  13. Dexilant 30 mg capsule, delayed release, 1 p.o. daily ____________________________ CHIEF COMPLAINTS  Followup of CAD,Native  Followup of Hypertensive Heart Disease-benign Without Chf ____________________________ HISTORY OF PRESENT ILLNESS  The patient seen for cardiac followup. She has been doing well from a cardiovascular viewpoint without angina. She has not checked her blood pressure and some time but developed a significant problem with her right shoulder and has been taking Naprosyn for it. Her blood pressure was elevated today. She denies angina and has no PND, orthopnea or edema. ____________________________ PAST HISTORY  Past Medical Illnesses:  hyperlipidemia, hypertension, gastric ulcer 10/05, GERD, migraine  headaches, irritable bowel syndrome, cervical disc disease;  Cardiovascular Illnesses:  CAD;  Surgical Procedures:  breast lumpectomy, hysterectomy May 2002, CABG w/LIMA and SVGs to Diag 1 OM 1 PDA PLR November 2002, Dr. Laneta Simmers;  Cardiology Procedures-Invasive:  cardiac cath (left) November 2002, cardiac cath (left) August 2003;  Cardiology Procedures-Noninvasive:  treadmill cardiolite September 2010, echocardiogram May 2014;  Cardiac Cath Results:  normal Left main, occluded LAD, 80% stenosis proximal CFX, occluded PDA, 95% stenosis PLR, widely patent Diag 1 SVG, widely patent PDA PLR SVG, widely patent LAD LIMA graft;  Peripheral Vascular Procedures:  venous dopplers May 2014;  LVEF of 60% documented via echocardiogram on 06/07/2102,   ____________________________ CARDIO-PULMONARY TEST DATES EKG Date:  05/24/2012;   Cardiac Cath Date:  08/24/2001;  CABG: 12/01/2000;  Nuclear Study Date:  09/11/2008;  Echocardiography Date: 06/06/2012;  Chest Xray Date: 06/08/2007;   ____________________________ FAMILY HISTORY Brother -- Brother alive and well Father -- Father dead, Coronary Artery Disease, Diabetes mellitus Mother -- Mother alive and well Sister -- Sister alive and well Sister -- Sister alive and well ____________________________ SOCIAL HISTORY Alcohol Use:  does not use alcohol;  Smoking:  nonsmoker;  Diet:  regular diet;  Lifestyle:  divorced;  Exercise:  exercises regularly;  Occupation:  [employer] In [place];  Residence:  lives alone;   ____________________________ REVIEW OF SYSTEMS General:  denies recent weight change, fatique or change in exercise tolerance. Eyes: wears eye glasses/contact lenses Respiratory: denies dyspnea, cough, wheezing or hemoptysis. Cardiovascular:  please review HPI Genitourinary-Female: no dysuria, urgency, frequency, UTIs, or stress incontinence Musculoskeletal:  right should pain, has been using Naprosyn Neurological:  denies headaches, stroke,  or TIA  ____________________________ PHYSICAL EXAMINATION VITAL SIGNS  Blood Pressure:  160/88 Sitting, Right arm, regular cuff  , 158/90 Standing, Right arm and regular cuff   Pulse:  66/min. Weight:  142.50 lbs. Height:  66"BMI: 23  Constitutional:  pleasant African American female in no acute distress Skin:  warm and dry to touch, no apparent skin lesions, or masses noted. Head:  normocephalic, normal hair pattern, no masses or tenderness ENT:  ears, nose and throat reveal no gross abnormalities.  Dentition good. Neck:  supple, no masses, thyromegaly, JVD. Carotid pulses are full and equal bilaterally without bruits. Chest:  clear to auscultation and percussion, healed median sternotomy scar Cardiac:  regular rhythm, normal S1 and S2, No S3 or S4, no murmurs, gallops or rubs detected. Peripheral Pulses:  the femoral,dorsalis pedis, and posterior tibial pulses are full and equal bilaterally with no bruits auscultated. Extremities & Back:  well healed saphenous vein donor site RLE, no edema present Neurological:  no gross motor or sensory deficits noted, affect appropriate, oriented x3. ____________________________ MOST RECENT LIPID PANEL 12/18/12  CHOL TOTL 178 mg/dl, LDL 161108 NM, HDL 44 mg/dl, TRIGLYCER 096128 mg/dl and CHOL/HDL 4.0 (Calc) ____________________________ IMPRESSIONS/PLAN  1. Hypertensive heart disease with elevation of blood pressure today likely due to the use of Naprosyn 2. Coronary artery disease with previous bypass grafting 3. Hyperlipidemia in treatment  Recommendations:  I increased her Diovan/HCTZ to 320/25 mg daily. She should monitor her blood pressure at home and stop using Naprosyn. Follow up otherwise in 6 months. ____________________________ TODAYS ORDERS  1. Return Visit: 6 months                       ____________________________ Cardiology Physician:  Darden PalmerW. Spencer Tilley, Jr. MD [facility]

## 2013-08-27 ENCOUNTER — Ambulatory Visit (INDEPENDENT_AMBULATORY_CARE_PROVIDER_SITE_OTHER): Payer: 59 | Admitting: Family Medicine

## 2013-08-27 ENCOUNTER — Encounter: Payer: Self-pay | Admitting: Family Medicine

## 2013-08-27 VITALS — BP 146/90 | HR 66 | Temp 97.6°F | Wt 141.2 lb

## 2013-08-27 DIAGNOSIS — J019 Acute sinusitis, unspecified: Secondary | ICD-10-CM

## 2013-08-27 MED ORDER — DOXYCYCLINE HYCLATE 100 MG PO TABS: 100.0000 mg | ORAL_TABLET | Freq: Two times a day (BID) | ORAL | Status: DC

## 2013-08-27 NOTE — Patient Instructions (Signed)
Try nasal saline, use warm compresses on your face, and start doxycycline.  Take it twice a day.  Take care.  Glad to see you.

## 2013-08-27 NOTE — Progress Notes (Signed)
Pre visit review using our clinic review tool, if applicable. No additional management support is needed unless otherwise documented below in the visit note.  Sx started 2+ weeks ago.  Started with B ear pain, "pressure pushing in on them."  Since then facial pain, R>L side of face. Some cough at night.  Taking otc cough meds.  No ST usually, occ at night.  No fevers.  No vomiting, no diarrhea.  Some nausea, either from otc cough meds or post nasal gtt.  All sx worse at night.    Meds, vitals, and allergies reviewed.   ROS: See HPI.  Otherwise, noncontributory.  GEN: nad, alert and oriented HEENT: mucous membranes moist, tm w/o erythema, nasal exam w/ R>L erythema, clear discharge noted,  OP with cobblestoning NECK: supple w/o LA CV: rrr.   PULM: ctab, no inc wob EXT: no edema SKIN: no acute rash

## 2013-08-28 DIAGNOSIS — J019 Acute sinusitis, unspecified: Secondary | ICD-10-CM | POA: Insufficient documentation

## 2013-08-28 NOTE — Assessment & Plan Note (Signed)
Given hx and duration, start doxy, routine cautions given to patient.  She agrees.  Nontoxic.  F/u prn.

## 2013-08-29 ENCOUNTER — Ambulatory Visit: Payer: 59 | Admitting: Family Medicine

## 2013-08-30 ENCOUNTER — Telehealth: Payer: Self-pay | Admitting: Family Medicine

## 2013-08-30 ENCOUNTER — Ambulatory Visit: Payer: Self-pay | Admitting: Family Medicine

## 2013-08-30 NOTE — Telephone Encounter (Signed)
Patient Information:  Caller Name: Kathie RhodesBetty  Phone: [phone]  Patient: Karen Saunders, Karen Saunders  Gender: Female  DOB: 11/09/1955  Age: 58 Years  PCP: Crawford Givensuncan, Graham Clelia Croft([facility](Family Practice)  Office Follow Up:  Does the office need to follow up with this patient?: No  Instructions For The Office: N/A   Symptoms  Reason For Call & Symptoms: Sinus congestion started 3 weeks ago, is worse at night.  Seen in office Tuesday 8/18, diagnosed with ear infection, started Doxycycline.  Has tried other measures but ear pressure and pain increasing - currently rated at 10/10  Reviewed Health History In EMR: Yes  Reviewed Medications In EMR: Yes  Reviewed Allergies In EMR: Yes  Reviewed Surgeries / Procedures: Yes  Date of Onset of Symptoms: 08/09/2013  Treatments Tried: nasal rinse BID, Mucinex, humidifier  Treatments Tried Worked: No  Guideline(s) Used:  Sinus Pain and Congestion  Disposition Per Guideline:   See Today or Tomorrow in Office  Reason For Disposition Reached:   Sinus congestion (pressure, fullness) present > 10 days  Advice Given:  N/A  Patient Will Follow Care Advice:  YES  Appointment Scheduled:  08/30/2013 14:00:00 Appointment Scheduled Provider:  Crawford Givensuncan, Graham Clelia Croft([facility](Family Practice)

## 2013-09-03 ENCOUNTER — Ambulatory Visit (INDEPENDENT_AMBULATORY_CARE_PROVIDER_SITE_OTHER): Payer: 59 | Admitting: Internal Medicine

## 2013-09-03 ENCOUNTER — Encounter: Payer: Self-pay | Admitting: Internal Medicine

## 2013-09-03 VITALS — BP 140/80 | HR 77 | Temp 98.2°F | Wt 138.0 lb

## 2013-09-03 DIAGNOSIS — J018 Other acute sinusitis: Secondary | ICD-10-CM

## 2013-09-03 MED ORDER — LEVOFLOXACIN 500 MG PO TABS: 500.0000 mg | ORAL_TABLET | Freq: Every day | ORAL | Status: DC

## 2013-09-03 NOTE — Progress Notes (Signed)
Pre visit review using our clinic review tool, if applicable. No additional management support is needed unless otherwise documented below in the visit note. 

## 2013-09-03 NOTE — Assessment & Plan Note (Signed)
No better on doxy--may be causing the nausea Will change to levaquin Consider adding clinda if still not better at end of week ?ENT

## 2013-09-03 NOTE — Progress Notes (Signed)
Subjective:    Patient ID: Karen Saunders, female    DOB: 06-08-1955, 58 y.o.   MRN: [id number]  HPI Still sick No better on the doxy--feels worse Sick for about a month now  Right ear is full ---having echoing and feels clogged Vomited some yesterday--nausea through the night Having facial pain No clear post nasal drip  No fever Slight cough No SOB  No other meds since last visit mucinex hadn't helped  Current Outpatient Prescriptions on File Prior to Visit  Medication Sig Dispense Refill  . amLODipine (NORVASC) 5 MG tablet Take 5 mg by mouth daily.      Marland Kitchen aspirin 81 MG tablet Take 81 mg by mouth daily.        Marland Kitchen atorvastatin (LIPITOR) 80 MG tablet Take 80 mg by mouth daily.      . celecoxib (CELEBREX) 100 MG capsule Take 100 mg by mouth 2 (two) times daily. Taken rarely      . dexlansoprazole (DEXILANT) 60 MG capsule Take 60 mg by mouth daily.      Marland Kitchen dicyclomine (BENTYL) 10 MG capsule Take 1 capsule (10 mg total) by mouth 4 (four) times daily -  before meals and at bedtime.  120 capsule  12  . doxycycline (VIBRA-TABS) 100 MG tablet Take 1 tablet (100 mg total) by mouth 2 (two) times daily.  20 tablet  0  . ezetimibe (ZETIA) 10 MG tablet Take 10 mg by mouth daily.        Marland Kitchen levocetirizine (XYZAL) 5 MG tablet TAKE 1 TABLET BY MOUTH EVERY DAY FOR ALLERGIES  30 tablet  9  . metoprolol (LOPRESSOR) 50 MG tablet Take 50 mg by mouth 2 (two) times daily.        . pantoprazole (PROTONIX) 40 MG tablet TWICE A DAY PRIOR TO A MEAL  180 tablet  1  . Pediatric Multivit-Minerals-C (RA GUMMY VITAMINS & MINERALS PO) Take 1 ampule by mouth daily.        . valsartan-hydrochlorothiazide (DIOVAN-HCT) 320-25 MG per tablet Take 1 tablet by mouth daily.       No current facility-administered medications on file prior to visit.    Allergies  Allergen Reactions  . Amoxicillin-Pot Clavulanate     REACTION: Rash on tongue  . Ibandronate Sodium     REACTION: GI side effects  . Iohexol    Desc: HIVES,NASAL CONGESTION DURING A CARDIAC CATH. REQUIRES PRE-MEDS.   . Raloxifene     REACTION: GI upset  . Sulfonamide Derivatives     REACTION: itching  . Zoledronic Acid     REACTION: unable to take this as she was intolerant of pre-tx calcium and vitamin D    Past Medical History  Diagnosis Date  . Hypertension   . Hyperlipidemia   . Osteoporosis 11/18/2004  . Migraine     Unsp w/o intract w/o status migrainosus  . IBS (irritable bowel syndrome)   . CAD (coronary artery disease)     Dr. Donnie Aho with Cards  . Leukopenia     with prev unremarkable heme eval ~2011  . Anemia     with prev unremarkable heme eval ~2011  . Shingles   . Trigeminal neuralgia 03/05/2012  . History of gastric ulcer     2005     Past Surgical History  Procedure Laterality Date  . Stress cardiolite  09/11/2008    Probably normal with breast attenuation noted but no evidence of ischemia  . Abdominal hysterectomy  2002  .  Tonsillectomy  ~ 1965  . Coronary artery bypass graft  2002    Family History  Problem Relation Age of Onset  . Arthritis Mother   . Diabetes Mother   . Hypertension Mother   . Stroke Father   . Heart disease Father     MI  . Diabetes Father   . Hypertension Father     History   Social History  . Marital Status: Divorced    Spouse Name: N/A    Number of Children: 1  . Years of Education: N/A   Occupational History  . MEDICAID [employer]  . 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 Smoking status: Never Smoker   . Smokeless tobacco: Never Used  . Alcohol Use: No  . Drug Use: No  . Sexual Activity: Not on file   Other Topics Concern  . Not on file   Social History Narrative   From [place].   Education:  14 years   Regular Exercise:  Yes   Enjoys time at church and jazz music   Review of Systems Appetite is off No rash No diarrhea    Objective:   Physical Exam  Constitutional: She appears  well-developed and well-nourished. No distress.  Looks uncomfortable   HENT:  Mouth/Throat: Oropharynx is clear and moist. No oropharyngeal exudate.  Some right maxillary and preauricular pressure (?slight tenderness) Moderate nasal inflammation TMs normal. Canals clear   Neck: Normal range of motion. Neck supple.  Pulmonary/Chest: Effort normal and breath sounds normal. No respiratory distress. She has no wheezes. She has no rales.  Lymphadenopathy:    She has no cervical adenopathy.          Assessment & Plan:

## 2013-09-27 ENCOUNTER — Ambulatory Visit (INDEPENDENT_AMBULATORY_CARE_PROVIDER_SITE_OTHER): Payer: 59 | Admitting: Family Medicine

## 2013-09-27 ENCOUNTER — Telehealth: Payer: Self-pay | Admitting: Family Medicine

## 2013-09-27 ENCOUNTER — Encounter: Payer: Self-pay | Admitting: Family Medicine

## 2013-09-27 VITALS — BP 130/80 | HR 71 | Temp 97.8°F | Wt 137.0 lb

## 2013-09-27 DIAGNOSIS — R05 Cough: Secondary | ICD-10-CM

## 2013-09-27 DIAGNOSIS — R059 Cough, unspecified: Secondary | ICD-10-CM

## 2013-09-27 MED ORDER — LEVOFLOXACIN 500 MG PO TABS: 500.0000 mg | ORAL_TABLET | Freq: Every day | ORAL | Status: DC

## 2013-09-27 MED ORDER — HYDROCODONE-HOMATROPINE 5-1.5 MG/5ML PO SYRP: 2.5000 mL | ORAL_SOLUTION | Freq: Three times a day (TID) | ORAL | Status: DC | PRN

## 2013-09-27 NOTE — Telephone Encounter (Signed)
Patient Information:  Caller Name: [place]  Phone: [phone]  Patient: Karen Saunders, Karen Saunders  Gender: Female  DOB: 16-May-1955  Age: 58 Years  PCP: Crawford Givens [name] [facility])  Office Follow Up:  Does the office need to follow up with this patient?: No  Instructions For The Office: N/A   Symptoms  Reason For Call & Symptoms: Pt is calling and states that she has a bad cough; sx started 09/20/2013; completed antibiotic with no relief; not able to sleep from the cough; not able to work today due to coughing; moist cough but not able to get anything up; sore throat is severe also  Reviewed Health History In EMR: Yes  Reviewed Medications In EMR: Yes  Reviewed Allergies In EMR: Yes  Reviewed Surgeries / Procedures: Yes  Date of Onset of Symptoms: 09/20/2013  Treatments Tried: Nyquil  Treatments Tried Worked: No  Guideline(s) Used:  Cough  Disposition Per Guideline:   See Today in Office  Reason For Disposition Reached:   Severe coughing spells (e.g., whooping sound after coughing, vomiting after coughing)  Advice Given:  Call Back If:  You become worse.  Patient Will Follow Care Advice:  YES  Appointment Scheduled:  09/27/2013 15:15:00 Appointment Scheduled Provider:  Crawford Givens [name] [facility])

## 2013-09-27 NOTE — Patient Instructions (Signed)
Start the antibiotics and use the cough medicine.  Take care.  Glad to see you.

## 2013-09-27 NOTE — Progress Notes (Signed)
Pre visit review using our clinic review tool, if applicable. No additional management support is needed unless otherwise documented below in the visit note.  Sx started >1 week ago.  Mult sick contacts at work.  Congestion, then now with cough.  Sensation of a rattle in the throat but not sputum production.  Coughing coming in fits.  Chest sore from cough.  No fevers.  No ear pain.  Runny and stuffy nose.    She had recovered from the last illness.  We talked about ENT referral if not improving.  This doesn't feel the same to her as the last illness, more in her chest.    Meds, vitals, and allergies reviewed.   ROS: See HPI.  Otherwise, noncontributory.  GEN: nad, alert and oriented HEENT: mucous membranes moist, tm w/o erythema, nasal exam w/o erythema, clear discharge noted,  OP with cobblestoning NECK: supple w/o LA CV: rrr.   PULM: Coarse BS B, no inc wob, no focal dec in BS EXT: no edema SKIN: no acute rash

## 2013-09-29 DIAGNOSIS — R058 Other specified cough: Secondary | ICD-10-CM | POA: Insufficient documentation

## 2013-09-29 DIAGNOSIS — R05 Cough: Secondary | ICD-10-CM | POA: Insufficient documentation

## 2013-09-29 NOTE — Assessment & Plan Note (Signed)
Presumed bronchitis, given the hx and duration would treat. D/w pt.  She agrees. Start levaquin and hydrocodone cough syrup, f/u prn.  Nontoxic.  She agrees. Rest and fluids o/w.

## 2013-10-11 ENCOUNTER — Other Ambulatory Visit: Payer: Self-pay

## 2013-10-11 MED ORDER — GUAIFENESIN-CODEINE 100-10 MG/5ML PO SYRP: 5.0000 mL | ORAL_SOLUTION | Freq: Three times a day (TID) | ORAL | Status: DC | PRN

## 2013-10-11 NOTE — Telephone Encounter (Signed)
Please call in cheratussin.  Thanks.

## 2013-10-11 NOTE — Telephone Encounter (Signed)
If she can be here to pick it up before 5, I'll fill it. Let me know.  Thanks.

## 2013-10-11 NOTE — Telephone Encounter (Signed)
Pt will not be able to make it to the clinic in time to pick up rx. Please advise

## 2013-10-11 NOTE — Telephone Encounter (Signed)
Pt left v/m;pt left v/m requesting rx for Hydrocodone homatropine. During the day cough is not bad but pt has a lot of coughing at night; if pt cannot get rx for Hydrocodone homatropine please call another cough med to [facility] [address]. Pt request cb.

## 2013-10-14 NOTE — Telephone Encounter (Signed)
Rx called to pharmacy. Patient notified that script was called to pharmacy.

## 2013-10-25 ENCOUNTER — Other Ambulatory Visit: Payer: Self-pay

## 2013-11-08 ENCOUNTER — Other Ambulatory Visit: Payer: Self-pay | Admitting: Family Medicine

## 2013-11-11 ENCOUNTER — Encounter: Payer: Self-pay | Admitting: Family Medicine

## 2013-11-12 ENCOUNTER — Other Ambulatory Visit: Payer: Self-pay | Admitting: Family Medicine

## 2013-11-12 MED ORDER — LORATADINE-PSEUDOEPHEDRINE ER 10-240 MG PO TB24: 1.0000 | ORAL_TABLET | Freq: Every day | ORAL | Status: DC

## 2013-11-12 NOTE — Telephone Encounter (Signed)
Have recorded it in your chart. Thanks

## 2013-11-26 ENCOUNTER — Other Ambulatory Visit: Payer: Self-pay | Admitting: Family Medicine

## 2013-11-26 ENCOUNTER — Encounter: Payer: Self-pay | Admitting: Family Medicine

## 2013-11-26 NOTE — Telephone Encounter (Signed)
Received refill request electronically from pharmacy. Last refill 11/16/12 #120/12 refills. Last office visit 09/27/13/acute. Is it okay to refill medication?

## 2013-11-27 ENCOUNTER — Other Ambulatory Visit: Payer: Self-pay | Admitting: Family Medicine

## 2013-11-27 MED ORDER — DICYCLOMINE HCL 10 MG PO CAPS: ORAL_CAPSULE | ORAL | Status: DC

## 2013-11-27 NOTE — Telephone Encounter (Signed)
Sent. Thanks.   

## 2013-12-16 ENCOUNTER — Ambulatory Visit (INDEPENDENT_AMBULATORY_CARE_PROVIDER_SITE_OTHER): Payer: 59 | Admitting: Podiatry

## 2013-12-16 ENCOUNTER — Encounter: Payer: Self-pay | Admitting: Podiatry

## 2013-12-16 VITALS — BP 151/87 | HR 68 | Resp 16

## 2013-12-16 DIAGNOSIS — L600 Ingrowing nail: Secondary | ICD-10-CM

## 2013-12-16 NOTE — Patient Instructions (Signed)

## 2013-12-16 NOTE — Progress Notes (Signed)
Subjective:     Patient ID: Karen Saunders, female   DOB: 06/29/1955, 58 y.o.   MRN: [id number]  HPI patient states she's been having trouble with the left big toe and she wanted to know what we could did   Review of Systems     Objective:   Physical Exam Neurovascular status intact with thick left hallux nail with the corners having healed well but the center being thick and painful when pressed from a dorsal direction    Assessment:     Damaged left hallux nail center portion with well-healing corners medial and lateral sides    Plan:     H&P performed and recommended at one point removal of the remainder of the nail. Patient would like to have this done but is not in the right mood sent for this now and wants to take a week off when she wants to do it she will reappoint her when it is appropriate. explained the surgery to patient

## 2014-01-06 ENCOUNTER — Ambulatory Visit: Payer: 59 | Admitting: Podiatry

## 2014-01-08 ENCOUNTER — Other Ambulatory Visit: Payer: Self-pay | Admitting: Family Medicine

## 2014-01-14 ENCOUNTER — Telehealth: Payer: Self-pay | Admitting: Family Medicine

## 2014-01-14 ENCOUNTER — Encounter: Payer: Self-pay | Admitting: Family Medicine

## 2014-01-14 DIAGNOSIS — J329 Chronic sinusitis, unspecified: Secondary | ICD-10-CM

## 2014-01-14 NOTE — Telephone Encounter (Signed)
See my chart message.  CT ordered

## 2014-01-15 ENCOUNTER — Telehealth: Payer: Self-pay | Admitting: Family Medicine

## 2014-01-15 ENCOUNTER — Ambulatory Visit (INDEPENDENT_AMBULATORY_CARE_PROVIDER_SITE_OTHER)
Admission: RE | Admit: 2014-01-15 | Discharge: 2014-01-15 | Disposition: A | Discharge status: Home / Self Care | Payer: 59 | Source: Ambulatory Visit | Attending: Family Medicine | Admitting: Family Medicine

## 2014-01-15 ENCOUNTER — Encounter: Payer: Self-pay | Admitting: Family Medicine

## 2014-01-15 DIAGNOSIS — J329 Chronic sinusitis, unspecified: Secondary | ICD-10-CM

## 2014-01-15 DIAGNOSIS — J3489 Other specified disorders of nose and nasal sinuses: Secondary | ICD-10-CM

## 2014-01-15 MED ORDER — LEVOFLOXACIN 500 MG PO TABS: 500.0000 mg | ORAL_TABLET | Freq: Every day | ORAL | Status: DC

## 2014-01-16 ENCOUNTER — Ambulatory Visit: Payer: 59 | Admitting: Podiatry

## 2014-01-16 NOTE — Telephone Encounter (Signed)
Called patient. She's had recurrent L>R but B max pain with burning in the nostrils in spite of nasal rinse use.  No fevers.  Stuffy.  Failed OTC meds/decongestants. She has a high pain tolerance and is still uncomfortable.  Pressure across her maxillary area B, worse leaning over.  The pain/pressure isn't like her prev trigeminal pain.  CT noted with minimal mucosal thickening in the floor of the maxillary sinuses.  D/w pt about options.  I'd like her to see ENT again and would treat in the meantime.  She agrees.  Referral ordered.  Med sent. She'll update me as needed.

## 2014-01-17 ENCOUNTER — Other Ambulatory Visit: Payer: Self-pay | Admitting: Family Medicine

## 2014-01-17 ENCOUNTER — Encounter: Payer: Self-pay | Admitting: Family Medicine

## 2014-01-17 MED ORDER — TRAMADOL HCL 50 MG PO TABS: 50.0000 mg | ORAL_TABLET | Freq: Three times a day (TID) | ORAL | Status: DC | PRN

## 2014-01-17 NOTE — Progress Notes (Signed)
See my chart message

## 2014-01-24 ENCOUNTER — Other Ambulatory Visit: Payer: Self-pay | Admitting: Otolaryngology

## 2014-01-24 DIAGNOSIS — R200 Anesthesia of skin: Secondary | ICD-10-CM

## 2014-01-24 DIAGNOSIS — R52 Pain, unspecified: Secondary | ICD-10-CM

## 2014-01-29 ENCOUNTER — Encounter: Payer: Self-pay | Admitting: Family Medicine

## 2014-01-29 ENCOUNTER — Other Ambulatory Visit: Payer: Self-pay | Admitting: Family Medicine

## 2014-01-29 MED ORDER — GUAIFENESIN-CODEINE 100-10 MG/5ML PO SYRP: 5.0000 mL | ORAL_SOLUTION | Freq: Three times a day (TID) | ORAL | Status: DC | PRN

## 2014-01-29 NOTE — Progress Notes (Signed)
Please call in cough syrup.  Thanks.

## 2014-01-29 NOTE — Progress Notes (Signed)
Rx called to pharmacy as instructed. 

## 2014-02-06 ENCOUNTER — Other Ambulatory Visit: Payer: Self-pay

## 2014-02-06 ENCOUNTER — Observation Stay (HOSPITAL_COMMUNITY)
Admission: EM | Admit: 2014-02-06 | Discharge: 2014-02-08 | Disposition: A | Discharge status: Home / Self Care | Payer: 59 | Attending: Internal Medicine | Admitting: Internal Medicine

## 2014-02-06 ENCOUNTER — Emergency Department (HOSPITAL_COMMUNITY): Payer: 59

## 2014-02-06 ENCOUNTER — Encounter (HOSPITAL_COMMUNITY): Payer: Self-pay | Admitting: Emergency Medicine

## 2014-02-06 DIAGNOSIS — R079 Chest pain, unspecified: Secondary | ICD-10-CM | POA: Diagnosis not present

## 2014-02-06 DIAGNOSIS — I119 Hypertensive heart disease without heart failure: Secondary | ICD-10-CM | POA: Insufficient documentation

## 2014-02-06 DIAGNOSIS — D649 Anemia, unspecified: Secondary | ICD-10-CM | POA: Diagnosis not present

## 2014-02-06 DIAGNOSIS — G500 Trigeminal neuralgia: Secondary | ICD-10-CM | POA: Insufficient documentation

## 2014-02-06 DIAGNOSIS — Z881 Allergy status to other antibiotic agents status: Secondary | ICD-10-CM | POA: Diagnosis not present

## 2014-02-06 DIAGNOSIS — Z888 Allergy status to other drugs, medicaments and biological substances status: Secondary | ICD-10-CM | POA: Diagnosis not present

## 2014-02-06 DIAGNOSIS — K589 Irritable bowel syndrome without diarrhea: Secondary | ICD-10-CM | POA: Insufficient documentation

## 2014-02-06 DIAGNOSIS — Z7982 Long term (current) use of aspirin: Secondary | ICD-10-CM | POA: Insufficient documentation

## 2014-02-06 DIAGNOSIS — M810 Age-related osteoporosis without current pathological fracture: Secondary | ICD-10-CM | POA: Diagnosis not present

## 2014-02-06 DIAGNOSIS — Z882 Allergy status to sulfonamides status: Secondary | ICD-10-CM | POA: Diagnosis not present

## 2014-02-06 DIAGNOSIS — E785 Hyperlipidemia, unspecified: Secondary | ICD-10-CM | POA: Insufficient documentation

## 2014-02-06 DIAGNOSIS — Z951 Presence of aortocoronary bypass graft: Secondary | ICD-10-CM | POA: Insufficient documentation

## 2014-02-06 DIAGNOSIS — R0981 Nasal congestion: Secondary | ICD-10-CM | POA: Diagnosis not present

## 2014-02-06 DIAGNOSIS — I2510 Atherosclerotic heart disease of native coronary artery without angina pectoris: Secondary | ICD-10-CM | POA: Diagnosis not present

## 2014-02-06 LAB — CBC
HCT: 33.2 % — ABNORMAL LOW (ref 36.0–46.0)
Hemoglobin: 11.4 g/dL — ABNORMAL LOW (ref 12.0–15.0)
MCH: 30 pg (ref 26.0–34.0)
MCHC: 34.3 g/dL (ref 30.0–36.0)
MCV: 87.4 fL (ref 78.0–100.0)
Platelets: 154 10*3/uL (ref 150–400)
RBC: 3.8 MIL/uL — ABNORMAL LOW (ref 3.87–5.11)
RDW: 13.8 % (ref 11.5–15.5)
WBC: 2.4 10*3/uL — ABNORMAL LOW (ref 4.0–10.5)

## 2014-02-06 LAB — BASIC METABOLIC PANEL
Anion gap: 8 (ref 5–15)
BUN: 17 mg/dL (ref 6–23)
CO2: 27 mmol/L (ref 19–32)
Calcium: 9.7 mg/dL (ref 8.4–10.5)
Chloride: 103 mmol/L (ref 96–112)
Creatinine, Ser: 1.06 mg/dL (ref 0.50–1.10)
GFR calc Af Amer: 65 mL/min — ABNORMAL LOW (ref >90)
GFR calc non Af Amer: 56 mL/min — ABNORMAL LOW (ref >90)
Glucose, Bld: 111 mg/dL — ABNORMAL HIGH (ref 70–99)
Potassium: 3.8 mmol/L (ref 3.5–5.1)
Sodium: 138 mmol/L (ref 135–145)

## 2014-02-06 LAB — I-STAT TROPONIN, ED: Troponin i, poc: 0.01 ng/mL (ref 0.00–0.08)

## 2014-02-06 LAB — BRAIN NATRIURETIC PEPTIDE: B Natriuretic Peptide: 18.2 pg/mL (ref 0.0–100.0)

## 2014-02-06 MED ORDER — NITROGLYCERIN 0.4 MG SL SUBL
0.4000 mg | SUBLINGUAL_TABLET | SUBLINGUAL | Status: DC | PRN
Start: 2014-02-06 — End: 2014-02-08
  Administered 2014-02-07: 0.4 mg via SUBLINGUAL
  Filled 2014-02-06: qty 1

## 2014-02-06 NOTE — ED Notes (Signed)
Pt. reports central chest pain with mild SOB and nausea onset this morning , Denies emesis or diaphoresis , history of CAD/CABG her cardiologist is Dr. Donnie Ahoilley .

## 2014-02-06 NOTE — ED Provider Notes (Signed)
CSN: [id number]     Arrival date & time 02/06/14  2207 History   First MD Initiated Contact with Patient 02/06/14 2306     Chief Complaint  Patient presents with  . Chest Pain   Karen Saunders is a 59 y.o. female with a history of renovascular disease, coronary artery bypass graft, hypertension, and hyperlipidemia who presents to emergency department complaining of substernal chest pain that radiates to her back starting around 8 AM this morning. Patient reports she had sudden onset heavy pressure pain while sitting down that lasted a 5-10 minutes. She reports her pain improved and then returned. She reports having some shortness of breath associated with this but denies any current shortness of breath. Patient was currently she is having substernal pain radiating to her back that she rates a 5 out of 10. Patient's pain is slightly worse with exertion. Patient denies fevers, chills, cough, wheezing, palpitations, leg swelling, nausea, vomiting or abdominal pain. Patient denies history of PEs or DVTs. Patient denies history of blood clotting disorders.  (Consider location/radiation/quality/duration/timing/severity/associated sxs/prior Treatment) HPI  Past Medical History  Diagnosis Date  . Hypertension   . Hyperlipidemia   . Osteoporosis 11/18/2004  . Migraine     Unsp w/o intract w/o status migrainosus  . IBS (irritable bowel syndrome)   . CAD (coronary artery disease)     Dr. Donnie Aho with Cards  . Leukopenia     with prev unremarkable heme eval ~2011  . Anemia     with prev unremarkable heme eval ~2011  . Shingles   . Trigeminal neuralgia 03/05/2012  . History of gastric ulcer     2005    Past Surgical History  Procedure Laterality Date  . Stress cardiolite  09/11/2008    Probably normal with breast attenuation noted but no evidence of ischemia  . Abdominal hysterectomy  2002  . Tonsillectomy  ~ 1965  . Coronary artery bypass graft  2002   Family History  Problem Relation Age of  Onset  . Arthritis Mother   . Diabetes Mother   . Hypertension Mother   . Stroke Father   . Heart disease Father     MI  . Diabetes Father   . Hypertension Father    History  Substance Use Topics  . Smoking status: Never Smoker   . Smokeless tobacco: Never Used  . Alcohol Use: No   OB History    No data available     Review of Systems  Constitutional: Negative for fever and chills.  HENT: Negative for congestion and sore throat.   Eyes: Negative for visual disturbance.  Respiratory: Positive for shortness of breath. Negative for cough and wheezing.   Cardiovascular: Positive for chest pain. Negative for palpitations and leg swelling.  Gastrointestinal: Negative for nausea, vomiting, abdominal pain and diarrhea.  Genitourinary: Negative for dysuria.  Musculoskeletal: Negative for back pain and neck pain.  Skin: Negative for rash.  Neurological: Negative for dizziness, syncope, light-headedness, numbness and headaches.      Allergies  Amoxicillin-pot clavulanate; Doxycycline; Ibandronate sodium; Iohexol; Raloxifene; Sulfonamide derivatives; and Zoledronic acid  Home Medications   Prior to Admission medications   Medication Sig Start Date End Date Taking? Authorizing Provider  amLODipine (NORVASC) 5 MG tablet Take 5 mg by mouth daily.    Historical Provider, MD  aspirin 81 MG tablet Take 81 mg by mouth daily.      Historical Provider, MD  atorvastatin (LIPITOR) 80 MG tablet Take 80 mg by mouth  daily.    Historical Provider, MD  celecoxib (CELEBREX) 100 MG capsule Take 100 mg by mouth 2 (two) times daily. Taken rarely    Historical Provider, MD  dexlansoprazole (DEXILANT) 60 MG capsule Take 60 mg by mouth daily.    Historical Provider, MD  dicyclomine (BENTYL) 10 MG capsule TAKE 1 CAPSULE (10 MG TOTAL) BY MOUTH 4 (FOUR) TIMES DAILY - BEFORE MEALS AND AT BEDTIME. 11/27/13   Joaquim NamGraham S Duncan, MD  ezetimibe (ZETIA) 10 MG tablet Take 10 mg by mouth daily.      Historical  Provider, MD  guaiFENesin-codeine (CHERATUSSIN AC) 100-10 MG/5ML syrup Take 5 mLs by mouth 3 (three) times daily as needed for cough. 01/29/14   Joaquim NamGraham S Duncan, MD  levocetirizine (XYZAL) 5 MG tablet TAKE 1 TABLET BY MOUTH EVERY DAY FOR ALLERGIES 01/08/14   Joaquim NamGraham S Duncan, MD  levofloxacin (LEVAQUIN) 500 MG tablet Take 1 tablet (500 mg total) by mouth daily. 01/15/14   Joaquim NamGraham S Duncan, MD  loratadine-pseudoephedrine (CLARITIN-D 24 HOUR) 10-240 MG per 24 hr tablet Take 1 tablet by mouth daily. 11/12/13   Joaquim NamGraham S Duncan, MD  metoprolol (LOPRESSOR) 50 MG tablet Take 50 mg by mouth 2 (two) times daily.      Historical Provider, MD  pantoprazole (PROTONIX) 40 MG tablet TAKE 1 TABLET TWICE A DAY 30MINS PRIOR TO A MEAL 11/08/13   Joaquim NamGraham S Duncan, MD  Pediatric Multivit-Minerals-C (RA GUMMY VITAMINS & MINERALS PO) Take 1 ampule by mouth daily.      Historical Provider, MD  traMADol (ULTRAM) 50 MG tablet Take 1 tablet (50 mg total) by mouth every 8 (eight) hours as needed. 01/17/14   Joaquim NamGraham S Duncan, MD  valsartan-hydrochlorothiazide (DIOVAN-HCT) 320-25 MG per tablet Take 1 tablet by mouth daily.    Historical Provider, MD   BP 174/95 mmHg  Pulse 60  Temp(Src) 98.1 F (36.7 C) (Oral)  Resp 18  Ht 5\' 4"  (1.626 m)  Wt 132 lb (59.875 kg)  BMI 22.65 kg/m2  SpO2 100% Physical Exam  Constitutional: She is oriented to person, place, and time. She appears well-developed and well-nourished. No distress.  HENT:  Head: Normocephalic and atraumatic.  Mouth/Throat: Oropharynx is clear and moist.  Eyes: Conjunctivae are normal. Pupils are equal, round, and reactive to light. Right eye exhibits no discharge. Left eye exhibits no discharge.  Neck: Neck supple.  Cardiovascular: Normal rate, regular rhythm, normal heart sounds and intact distal pulses.  Exam reveals no gallop and no friction rub.   No murmur heard. Bilateral radial pulses are intact. Bilateral posterior tibialis and dorsalis pedis pulses are intact.   Pulmonary/Chest: Effort normal and breath sounds normal. No respiratory distress. She has no wheezes. She has no rales.  Abdominal: Soft. She exhibits no distension. There is no tenderness.  Musculoskeletal: She exhibits no edema or tenderness.  No lower extremity edema noted. No calf tenderness.   Lymphadenopathy:    She has no cervical adenopathy.  Neurological: She is alert and oriented to person, place, and time. Coordination normal.  Skin: Skin is warm and dry. No rash noted. She is not diaphoretic. No erythema. No pallor.  Psychiatric: She has a normal mood and affect. Her behavior is normal.  Nursing note and vitals reviewed.   ED Course  Procedures (including critical care time) Labs Review Labs Reviewed  CBC - Abnormal; Notable for the following:    WBC 2.4 (*)    RBC 3.80 (*)    Hemoglobin 11.4 (*)  HCT 33.2 (*)    All other components within normal limits  BASIC METABOLIC PANEL - Abnormal; Notable for the following:    Glucose, Bld 111 (*)    GFR calc non Af Amer 56 (*)    GFR calc Af Amer 65 (*)    All other components within normal limits  BRAIN NATRIURETIC PEPTIDE  I-STAT TROPOININ, ED    Imaging Review Dg Chest 2 View  02/06/2014   CLINICAL DATA:  Acute onset of mid chest pain, shooting to the upper back. Nausea. Initial encounter.  EXAM: CHEST  2 VIEW  COMPARISON:  Chest radiograph performed 04/20/2012  FINDINGS: The lungs are well-aerated and clear. There is no evidence of focal opacification, pleural effusion or pneumothorax.  The heart is normal in size; the patient is status post median sternotomy. No acute osseous abnormalities are seen.  IMPRESSION: No acute cardiopulmonary process seen.   Electronically Signed   By: Roanna Raider M.D.   On: 02/06/2014 22:47     EKG Interpretation   Date/Time:  Thursday February 06 2014 22:10:50 EST Ventricular Rate:  71 PR Interval:  128 QRS Duration: 74 QT Interval:  368 QTC Calculation: 399 R Axis:   67 Text  Interpretation:  Normal sinus rhythm Possible Left atrial enlargement  ST \\T \ T wave abnormality, consider inferior ischemia ST \\T \ T wave  abnormality, consider anterolateral ischemia No significant change since  last tracing Confirmed by HARRISON  MD, FORREST ([id number]) on 02/06/2014  11:13:12 PM      Filed Vitals:   02/06/14 2227 02/06/14 2303  BP: 155/93 174/95  Pulse: 66 60  Temp: 98.4 F (36.9 C) 98.1 F (36.7 C)  TempSrc: Oral Oral  Resp: 22 18  Height:  (1.626 m)   Weight: 132 lb (59.875 kg)   SpO2: 97% 100%     MDM   Meds given in ED:  Medications  nitroGLYCERIN (NITROSTAT) SL tablet 0.4 mg (0.4 mg Sublingual Given 02/07/14 0003)    New Prescriptions   No medications on file    Final diagnoses:  Chest pain   This is a 59 year old female with a history of cardiovascular disease, and CABG and a patient of Dr. Donnie Aho who presents to the ED complaining of substernal chest pain radiating to her back since 8 AM this morning. Patient reports she had sudden onset heavy pressure pain while sitting down that lasted a 5-10 minutes. She reports her pain improved and then returned. At presentation to the ED her pain was 5/10. The patient received one dose of nitro that completely resolved her pain. Due to patient's history consulted cardiology. Spoke with cardiology fellow Dr. Onalee Hua who thought that her pain is likely not ischemic colitic the patient admitted by the hospitalist to rule out ACS. He said he would be happy to see the patient if needed. Patient in agreement with admission. Patient accepted for admission by Dr. Selena Batten.    This patient was discussed with and evaluated by Dr. Romeo Apple who agrees with assessment and plan.     Lawana Chambers, PA-C 02/07/14 1610  Purvis Sheffield, MD 02/07/14 [phone]

## 2014-02-07 ENCOUNTER — Observation Stay (HOSPITAL_COMMUNITY): Payer: 59

## 2014-02-07 ENCOUNTER — Inpatient Hospital Stay: Admission: RE | Admit: 2014-02-07 | Payer: 59 | Source: Ambulatory Visit

## 2014-02-07 ENCOUNTER — Encounter (HOSPITAL_COMMUNITY): Payer: Self-pay | Admitting: Internal Medicine

## 2014-02-07 DIAGNOSIS — I2510 Atherosclerotic heart disease of native coronary artery without angina pectoris: Secondary | ICD-10-CM | POA: Diagnosis not present

## 2014-02-07 DIAGNOSIS — D649 Anemia, unspecified: Secondary | ICD-10-CM

## 2014-02-07 DIAGNOSIS — R079 Chest pain, unspecified: Secondary | ICD-10-CM | POA: Diagnosis present

## 2014-02-07 DIAGNOSIS — R0981 Nasal congestion: Secondary | ICD-10-CM

## 2014-02-07 DIAGNOSIS — I119 Hypertensive heart disease without heart failure: Secondary | ICD-10-CM | POA: Diagnosis not present

## 2014-02-07 DIAGNOSIS — E785 Hyperlipidemia, unspecified: Secondary | ICD-10-CM

## 2014-02-07 DIAGNOSIS — I10 Essential (primary) hypertension: Secondary | ICD-10-CM

## 2014-02-07 LAB — TROPONIN I: Troponin I: <0.03 ng/mL (ref <0.031)

## 2014-02-07 LAB — MRSA PCR SCREENING: MRSA by PCR: NEGATIVE

## 2014-02-07 MED ORDER — SODIUM CHLORIDE 0.9 % IJ SOLN
3.0000 mL | Freq: Two times a day (BID) | INTRAMUSCULAR | Status: DC
  Administered 2014-02-07: 3 mL via INTRAVENOUS

## 2014-02-07 MED ORDER — VALSARTAN-HYDROCHLOROTHIAZIDE 320-25 MG PO TABS: 1.0000 | ORAL_TABLET | Freq: Every day | ORAL | Status: DC

## 2014-02-07 MED ORDER — DICYCLOMINE HCL 10 MG PO CAPS
10.0000 mg | ORAL_CAPSULE | Freq: Three times a day (TID) | ORAL | Status: DC
  Administered 2014-02-07 – 2014-02-08 (×4): 10 mg via ORAL
  Filled 2014-02-07 (×9): qty 1

## 2014-02-07 MED ORDER — KETOROLAC TROMETHAMINE 30 MG/ML IJ SOLN
30.0000 mg | Freq: Once | INTRAMUSCULAR | Status: AC
  Administered 2014-02-07: 30 mg via INTRAVENOUS
  Filled 2014-02-07: qty 1

## 2014-02-07 MED ORDER — TECHNETIUM TC 99M SESTAMIBI GENERIC - CARDIOLITE
10.0000 | Freq: Once | INTRAVENOUS | Status: AC | PRN
  Administered 2014-02-07: 10 via INTRAVENOUS

## 2014-02-07 MED ORDER — ATORVASTATIN CALCIUM 80 MG PO TABS
80.0000 mg | ORAL_TABLET | Freq: Every day | ORAL | Status: DC
  Administered 2014-02-07 – 2014-02-08 (×2): 80 mg via ORAL
  Filled 2014-02-07 (×2): qty 1

## 2014-02-07 MED ORDER — NITROGLYCERIN 0.4 MG SL SUBL
SUBLINGUAL_TABLET | SUBLINGUAL | Status: AC
  Filled 2014-02-07: qty 1

## 2014-02-07 MED ORDER — HYDROCHLOROTHIAZIDE 25 MG PO TABS
25.0000 mg | ORAL_TABLET | Freq: Every day | ORAL | Status: DC
  Administered 2014-02-07 – 2014-02-08 (×2): 25 mg via ORAL
  Filled 2014-02-07 (×2): qty 1

## 2014-02-07 MED ORDER — ENOXAPARIN SODIUM 40 MG/0.4ML ~~LOC~~ SOLN
40.0000 mg | SUBCUTANEOUS | Status: DC
  Administered 2014-02-07: 40 mg via SUBCUTANEOUS
  Filled 2014-02-07 (×2): qty 0.4

## 2014-02-07 MED ORDER — TECHNETIUM TC 99M SESTAMIBI - CARDIOLITE
30.0000 | Freq: Once | INTRAVENOUS | Status: AC | PRN
  Administered 2014-02-07: 30 via INTRAVENOUS

## 2014-02-07 MED ORDER — NITROGLYCERIN 2 % TD OINT
1.0000 [in_us] | TOPICAL_OINTMENT | Freq: Three times a day (TID) | TRANSDERMAL | Status: DC
  Administered 2014-02-07 (×2): 1 [in_us] via TOPICAL
  Filled 2014-02-07: qty 1
  Filled 2014-02-07: qty 30

## 2014-02-07 MED ORDER — ACETAMINOPHEN 500 MG PO TABS
1000.0000 mg | ORAL_TABLET | Freq: Once | ORAL | Status: AC
  Administered 2014-02-07: 1000 mg via ORAL
  Filled 2014-02-07: qty 2

## 2014-02-07 MED ORDER — ACETAMINOPHEN 325 MG PO TABS
650.0000 mg | ORAL_TABLET | Freq: Four times a day (QID) | ORAL | Status: DC | PRN
  Administered 2014-02-07: 650 mg via ORAL
  Filled 2014-02-07: qty 2

## 2014-02-07 MED ORDER — METOCLOPRAMIDE HCL 5 MG/ML IJ SOLN
10.0000 mg | Freq: Once | INTRAMUSCULAR | Status: AC
  Administered 2014-02-07: 10 mg via INTRAVENOUS
  Filled 2014-02-07: qty 2

## 2014-02-07 MED ORDER — IRBESARTAN 300 MG PO TABS
300.0000 mg | ORAL_TABLET | Freq: Every day | ORAL | Status: DC
  Administered 2014-02-07 – 2014-02-08 (×2): 300 mg via ORAL
  Filled 2014-02-07 (×2): qty 1

## 2014-02-07 MED ORDER — DIPHENHYDRAMINE HCL 50 MG/ML IJ SOLN
25.0000 mg | Freq: Once | INTRAMUSCULAR | Status: AC
  Administered 2014-02-07: 25 mg via INTRAVENOUS
  Filled 2014-02-07: qty 1

## 2014-02-07 MED ORDER — REGADENOSON 0.4 MG/5ML IV SOLN
INTRAVENOUS | Status: AC
  Filled 2014-02-07: qty 5

## 2014-02-07 MED ORDER — AMLODIPINE BESYLATE 5 MG PO TABS
5.0000 mg | ORAL_TABLET | Freq: Every day | ORAL | Status: DC
  Administered 2014-02-07 – 2014-02-08 (×2): 5 mg via ORAL
  Filled 2014-02-07 (×2): qty 1

## 2014-02-07 MED ORDER — METOPROLOL TARTRATE 50 MG PO TABS
50.0000 mg | ORAL_TABLET | Freq: Two times a day (BID) | ORAL | Status: DC
  Administered 2014-02-07 – 2014-02-08 (×3): 50 mg via ORAL
  Filled 2014-02-07 (×4): qty 1

## 2014-02-07 MED ORDER — NITROGLYCERIN 0.4 MG SL SUBL
0.4000 mg | SUBLINGUAL_TABLET | SUBLINGUAL | Status: DC | PRN
  Administered 2014-02-07: 0.4 mg via SUBLINGUAL

## 2014-02-07 MED ORDER — ONDANSETRON HCL 4 MG/2ML IJ SOLN
INTRAMUSCULAR | Status: AC
  Filled 2014-02-07: qty 2

## 2014-02-07 MED ORDER — SODIUM CHLORIDE 0.9 % IV SOLN
INTRAVENOUS | Status: AC
  Administered 2014-02-07: 75 mL/h via INTRAVENOUS

## 2014-02-07 MED ORDER — FLUTICASONE PROPIONATE 50 MCG/ACT NA SUSP
1.0000 | Freq: Every day | NASAL | Status: DC
  Administered 2014-02-07 – 2014-02-08 (×2): 1 via NASAL
  Filled 2014-02-07: qty 16

## 2014-02-07 MED ORDER — ONDANSETRON HCL 4 MG/2ML IJ SOLN
4.0000 mg | Freq: Four times a day (QID) | INTRAMUSCULAR | Status: DC | PRN
  Administered 2014-02-07: 4 mg via INTRAVENOUS

## 2014-02-07 MED ORDER — LORATADINE 10 MG PO TABS
10.0000 mg | ORAL_TABLET | Freq: Every evening | ORAL | Status: DC
  Administered 2014-02-07: 10 mg via ORAL
  Filled 2014-02-07 (×2): qty 1

## 2014-02-07 MED ORDER — PANTOPRAZOLE SODIUM 40 MG PO TBEC: 40.0000 mg | DELAYED_RELEASE_TABLET | Freq: Every day | ORAL | Status: DC

## 2014-02-07 MED ORDER — EZETIMIBE 10 MG PO TABS
10.0000 mg | ORAL_TABLET | Freq: Every day | ORAL | Status: DC
  Administered 2014-02-07 – 2014-02-08 (×2): 10 mg via ORAL
  Filled 2014-02-07 (×2): qty 1

## 2014-02-07 MED ORDER — REGADENOSON 0.4 MG/5ML IV SOLN
0.4000 mg | Freq: Once | INTRAVENOUS | Status: AC
  Administered 2014-02-07: 0.4 mg via INTRAVENOUS

## 2014-02-07 MED ORDER — ASPIRIN 81 MG PO CHEW
81.0000 mg | CHEWABLE_TABLET | Freq: Every day | ORAL | Status: DC
  Administered 2014-02-07 – 2014-02-08 (×2): 81 mg via ORAL
  Filled 2014-02-07 (×4): qty 1

## 2014-02-07 MED ORDER — PANTOPRAZOLE SODIUM 40 MG PO TBEC
40.0000 mg | DELAYED_RELEASE_TABLET | Freq: Every day | ORAL | Status: DC
  Administered 2014-02-07 – 2014-02-08 (×2): 40 mg via ORAL
  Filled 2014-02-07 (×2): qty 1

## 2014-02-07 NOTE — ED Notes (Signed)
Dr.Harrison informed of patient's cp, requested analgesic.  See new orders

## 2014-02-07 NOTE — ED Notes (Signed)
Patient transported to CT 

## 2014-02-07 NOTE — Progress Notes (Signed)
[facility]                                                                              Patient Demographics  Karen Saunders, is a 59 y.o. female, DOB - [birth year], ZOX:[id number]  Admit date - 02/06/2014   Admitting Physician Pearson Grippe, MD  Outpatient Primary MD for the patient is Crawford Givens, MD  LOS - 1   Chief Complaint  Patient presents with  . Chest Pain      HPI on 02/07/2014 by Dr. Pearson Grippe 59 yo female with htn, hyperlipidemia, cad s/p CABG apparently c/o chest pain starting this morning about 8am. sscp "pressure" with some radiation to the back. Denies fever, chills, cough , sob, diaphoresis. Took slg nitro with some relief after about 1 minute per pt. EKG showed nsr with t wave inversion in 2, 3, avf, v3-6. CXR negative for any acute process. ED contacted cardiology who requested that medicine admit the patient and ED was not certain if cardiology will be by in am. Pt will be admitted for w/up of chest pain.   Assessment & Plan   Chest pain/CAD -Troponins cycled, negative thus far -Cardiology consulted and appreciated (Dr. Donnie Aho) -Currently chest pain free, nitropaste in place -Continue aspirin, statin, ARB, BB -Nuclear stress ordered by admitting physician, pending  Essential Hypertension -Stable -Continue HCTZ, amloidipine, metoprolol, irbesartan  Sinus congestion -Will order flonase -Patient has an outpatient MRI of the orbits scheduled  Normocytic Anemia -Hb appears to be at baseline, 11  Hyperlipidemia -Continue statin  Code Status: full  Family Communication: None at bedside  Disposition Plan: Admitted for observation  Time Spent in minutes   30 minutes  Procedures  None  Consults   Cardiology  DVT Prophylaxis  Lovenox  Lab Results  Component Value Date   PLT 154 02/06/2014    Medications  Scheduled Meds: . amLODipine  5 mg Oral Daily  . aspirin  81 mg Oral Daily  . atorvastatin  80 mg Oral Daily  . dicyclomine   10 mg Oral TID AC & HS  . enoxaparin (LOVENOX) injection  40 mg Subcutaneous Q24H  . ezetimibe  10 mg Oral Daily  . fluticasone  1 spray Each Nare Daily  . irbesartan  300 mg Oral Daily   And  . hydrochlorothiazide  25 mg Oral Daily  . loratadine  10 mg Oral QPM  . metoprolol  50 mg Oral BID  . nitroGLYCERIN  1 inch Topical 3 times per day  . pantoprazole  40 mg Oral Daily  . sodium chloride  3 mL Intravenous Q12H   Continuous Infusions: . sodium chloride 75 mL/hr at 02/07/14 1000   PRN Meds:.nitroGLYCERIN, ondansetron, technetium sestamibi  Antibiotics    Anti-infectives    None       Subjective:   Chase Picket seen and examined today.  Patient states her chest pain has been ongoing since yesterday morning while at work.  She states the pain resolved however came back yesterday evening and was constant until coming to the hospital .  She also complains of nasal congestion. Denies SOB, abdominal pain, headache, dizziness at this time.   Objective:   Filed  Vitals:   02/07/14 0230 02/07/14 0315 02/07/14 0319 02/07/14 0611  BP: 127/87  147/93 133/76  Pulse: 66  64 69  Temp:   98.2 F (36.8 C)   TempSrc:   Oral   Resp: 18  16   Height:  5\' 2"  (1.575 m)    Weight:  63.7 kg (140 lb 6.9 oz)    SpO2: 97%  100% 99%    Wt Readings from Last 3 Encounters:  02/07/14 63.7 kg (140 lb 6.9 oz)  09/27/13 62.143 kg (137 lb)  09/03/13 62.596 kg (138 lb)     Intake/Output Summary (Last 24 hours) at 02/07/14 1146 Last data filed at 02/07/14 1000  Gross per 24 hour  Intake 506.25 ml  Output      0 ml  Net 506.25 ml    Exam  General: Well developed, well nourished, NAD, appears stated age  HEENT: NCAT,mucous membranes moist.   Cardiovascular: S1 S2 auscultated, no rubs, murmurs or gallops. Regular rate and rhythm.  Respiratory: Clear to auscultation bilaterally with equal chest rise  Abdomen: Soft, nontender, nondistended, + bowel sounds  Extremities: warm dry without  cyanosis clubbing or edema  Neuro: AAOx3, nonfocal  Skin: Without rashes exudates or nodules, scar on chest and RLE-well healed  Psych: Normal affect and demeanor with intact judgement and insight  Data Review   Micro Results Recent Results (from the past 240 hour(s))  MRSA PCR Screening     Status: None   Collection Time: 02/07/14  3:52 AM  Result Value Ref Range Status   MRSA by PCR NEGATIVE NEGATIVE Final    Comment:        The GeneXpert MRSA Assay (FDA approved for NASAL specimens only), is one component of a comprehensive MRSA colonization surveillance program. It is not intended to diagnose MRSA infection nor to guide or monitor treatment for MRSA infections.     Radiology Reports Dg Chest 2 View  02/06/2014   CLINICAL DATA:  Acute onset of mid chest pain, shooting to the upper back. Nausea. Initial encounter.  EXAM: CHEST  2 VIEW  COMPARISON:  Chest radiograph performed 04/20/2012  FINDINGS: The lungs are well-aerated and clear. There is no evidence of focal opacification, pleural effusion or pneumothorax.  The heart is normal in size; the patient is status post median sternotomy. No acute osseous abnormalities are seen.  IMPRESSION: No acute cardiopulmonary process seen.   Electronically Signed   By: Roanna RaiderJeffery  Chang M.D.   On: 02/06/2014 22:47   Ct Chest Wo Contrast  02/07/2014   CLINICAL DATA:  Severe mid chest pain, radiating to the back, acute onset. Initial encounter.  EXAM: CT CHEST WITHOUT CONTRAST  TECHNIQUE: Multidetector CT imaging of the chest was performed following the standard protocol without IV contrast.  COMPARISON:  CT of the chest performed 10/16/2003, and chest radiograph performed 02/06/2014  FINDINGS: Minimal bilateral atelectasis is seen. The lungs are otherwise clear. No focal consolidation, pleural effusion or pneumothorax is seen. A small bleb is noted at the left midlung zone, within the left upper lobe. No suspicious masses are identified.  The  heart is normal in size. Diffuse coronary artery calcifications are seen. Postoperative change is noted about the mediastinum, and the patient is status post median sternotomy. The great vessels are grossly unremarkable. No pericardial effusion is identified. No mediastinal lymphadenopathy is seen.  There is apparent nodularity of the left thyroid lobe, measuring approximately 1.9 cm. This may simply reflect an unusual appearing thyroid lobe,  better characterized than on the prior study.  The visualized portions of the liver and spleen are unremarkable. A 1.4 cm cyst is noted at the upper pole of the left kidney.  No acute osseous abnormalities are seen.  IMPRESSION: 1. No acute abnormality seen to explain patient's symptoms. 2. Minimal bilateral atelectasis seen. Small bleb at the left midlung zone, within the left upper lobe. Lungs otherwise clear. 3. Diffuse coronary artery calcification noted. 4. Apparent nodularity of the left thyroid lobe, measuring approximately 1.9 cm. Consider further evaluation with thyroid ultrasound. If patient is clinically hyperthyroid, consider nuclear medicine thyroid uptake and scan. 5. Small left renal cyst seen.   Electronically Signed   By: Roanna Raider M.D.   On: 02/07/2014 01:42   Ct Maxillofacial Ltd Wo Cm  01/15/2014   CLINICAL DATA:  Chronic sinusitis, left-sided facial tenderness, nasal congestion  EXAM: CT PARANASAL SINUS LIMITED WITHOUT CONTRAST  TECHNIQUE: Non-contiguous multidetector CT images of the paranasal sinuses were obtained in a single plane without contrast.  COMPARISON:  Limited CT paranasal sinus exam of 04/30/2010  FINDINGS: The paranasal sinuses are well pneumatized. Only minimal mucosal thickening is present in the floor both maxillary sinuses. No air-fluid level is seen. There is slight nasal septal deviation to the right of midline. The nasal turbinates are normal in size and position.  IMPRESSION: No sinusitis. Minimal mucosal thickening in the  floor of the maxillary sinuses. Some nasal septal deviation to the right.   Electronically Signed   By: Dwyane Dee M.D.   On: 01/15/2014 10:06    CBC  Recent Labs Lab 02/06/14 2223  WBC 2.4*  HGB 11.4*  HCT 33.2*  PLT 154  MCV 87.4  MCH 30.0  MCHC 34.3  RDW 13.8    Chemistries   Recent Labs Lab 02/06/14 2223  NA 138  K 3.8  CL 103  CO2 27  GLUCOSE 111*  BUN 17  CREATININE 1.06  CALCIUM 9.7   ------------------------------------------------------------------------------------------------------------------ estimated creatinine clearance is 49.4 mL/min (by C-G formula based on Cr of 1.06). ------------------------------------------------------------------------------------------------------------------ No results for input(s): HGBA1C in the last 72 hours. ------------------------------------------------------------------------------------------------------------------ No results for input(s): CHOL, HDL, LDLCALC, TRIG, CHOLHDL, LDLDIRECT in the last 72 hours. ------------------------------------------------------------------------------------------------------------------ No results for input(s): TSH, T4TOTAL, T3FREE, THYROIDAB in the last 72 hours.  Invalid input(s): FREET3 ------------------------------------------------------------------------------------------------------------------ No results for input(s): VITAMINB12, FOLATE, FERRITIN, TIBC, IRON, RETICCTPCT in the last 72 hours.  Coagulation profile No results for input(s): INR, PROTIME in the last 168 hours.  No results for input(s): DDIMER in the last 72 hours.  Cardiac Enzymes  Recent Labs Lab 02/07/14 0717  TROPONINI <0.03   ------------------------------------------------------------------------------------------------------------------ Invalid input(s): POCBNP    Lynnlee Revels D.O. on 02/07/2014 at 11:46 AM  Between 7am to 7pm - Pager - [phone]  After 7pm go to [email or web address] -  password [email or web address]  And look for the night coverage person covering for me after hours  [facility] Office  [phone]

## 2014-02-07 NOTE — H&P (Signed)
Karen Saunders is an 59 y.o. female.    Elsie Stain (pcp) Landry Corporal (cardiologist)  Chief Complaint: chest pain HPI: 59 yo female with htn, hyperlipidemia, cad s/p CABG apparently c/o chest pain starting this morning about 8am.  sscp "pressure" with some radiation to the back.  Denies fever, chills, cough , sob, diaphoresis.  Took slg nitro with some relief after about 1 minute per pt.  EKG showed nsr with t wave inversion in 2, 3, avf, v3-6.  CXR negative for any acute process. ED contacted cardiology who requested that medicine admit the patient and ED was not certain if cardiology will be by in am.   Pt will be admitted for w/up of chest pain.    Past Medical History  Diagnosis Date  . Hypertension   . Hyperlipidemia   . Osteoporosis 11/18/2004  . Migraine     Unsp w/o intract w/o status migrainosus  . IBS (irritable bowel syndrome)   . CAD (coronary artery disease)     Dr. Wynonia Lawman with Cards  . Leukopenia     with prev unremarkable heme eval ~2011  . Anemia     with prev unremarkable heme eval ~2011  . Shingles   . Trigeminal neuralgia 03/05/2012  . History of gastric ulcer     2005     Past Surgical History  Procedure Laterality Date  . Stress cardiolite  09/11/2008    Probably normal with breast attenuation noted but no evidence of ischemia  . Abdominal hysterectomy  2002  . Tonsillectomy  ~ 1965  . Coronary artery bypass graft  2002    Family History  Problem Relation Age of Onset  . Arthritis Mother   . Diabetes Mother   . Hypertension Mother   . Stroke Father   . Heart disease Father     MI  . Diabetes Father   . Hypertension Father    Social History:  reports that she has never smoked. She has never used smokeless tobacco. She reports that she does not drink alcohol or use illicit drugs.  Allergies:  Allergies  Allergen Reactions  . Amoxicillin-Pot Clavulanate     REACTION: Rash on tongue  . Doxycycline     GI intolerance  . Ibandronate Sodium      REACTION: GI side effects  . Iohexol      Desc: HIVES,NASAL CONGESTION DURING A CARDIAC CATH. REQUIRES PRE-MEDS.   . Raloxifene     REACTION: GI upset  . Sulfonamide Derivatives     REACTION: itching  . Zoledronic Acid     REACTION: unable to take this as she was intolerant of pre-tx calcium and vitamin D    Medication reviewed (Not in a hospital admission)  Results for orders placed or performed during the hospital encounter of 02/06/14 (from the past 48 hour(s))  CBC     Status: Abnormal   Collection Time: 02/06/14 10:23 PM  Result Value Ref Range   WBC 2.4 (L) 4.0 - 10.5 K/uL   RBC 3.80 (L) 3.87 - 5.11 MIL/uL   Hemoglobin 11.4 (L) 12.0 - 15.0 g/dL   HCT 33.2 (L) 36.0 - 46.0 %   MCV 87.4 78.0 - 100.0 fL   MCH 30.0 26.0 - 34.0 pg   MCHC 34.3 30.0 - 36.0 g/dL   RDW 13.8 11.5 - 15.5 %   Platelets 154 150 - 400 K/uL  Basic metabolic panel     Status: Abnormal   Collection Time: 02/06/14 10:23 PM  Result Value Ref Range   Sodium 138 135 - 145 mmol/L   Potassium 3.8 3.5 - 5.1 mmol/L   Chloride 103 96 - 112 mmol/L   CO2 27 19 - 32 mmol/L   Glucose, Bld 111 (H) 70 - 99 mg/dL   BUN 17 6 - 23 mg/dL   Creatinine, Ser 1.06 0.50 - 1.10 mg/dL   Calcium 9.7 8.4 - 10.5 mg/dL   GFR calc non Af Amer 56 (L) >90 mL/min   GFR calc Af Amer 65 (L) >90 mL/min    Comment: (NOTE) The eGFR has been calculated using the CKD EPI equation. This calculation has not been validated in all clinical situations. eGFR's persistently <90 mL/min signify possible Chronic Kidney Disease.    Anion gap 8 5 - 15  BNP (order ONLY if patient complains of dyspnea/SOB AND you have documented it for THIS visit)     Status: None   Collection Time: 02/06/14 10:23 PM  Result Value Ref Range   B Natriuretic Peptide 18.2 0.0 - 100.0 pg/mL  I-stat troponin, ED (not at [facility])     Status: None   Collection Time: 02/06/14 10:27 PM  Result Value Ref Range   Troponin i, poc 0.01 0.00 - 0.08 ng/mL   Comment 3             Comment: Due to the release kinetics of cTnI, a negative result within the first hours of the onset of symptoms does not rule out myocardial infarction with certainty. If myocardial infarction is still suspected, repeat the test at appropriate intervals.    Dg Chest 2 View  02/06/2014   CLINICAL DATA:  Acute onset of mid chest pain, shooting to the upper back. Nausea. Initial encounter.  EXAM: CHEST  2 VIEW  COMPARISON:  Chest radiograph performed 04/20/2012  FINDINGS: The lungs are well-aerated and clear. There is no evidence of focal opacification, pleural effusion or pneumothorax.  The heart is normal in size; the patient is status post median sternotomy. No acute osseous abnormalities are seen.  IMPRESSION: No acute cardiopulmonary process seen.   Electronically Signed   By: Garald Balding M.D.   On: 02/06/2014 22:47    Review of Systems  Constitutional: Negative for fever, chills, weight loss, malaise/fatigue and diaphoresis.  HENT: Negative for congestion, ear discharge, ear pain, hearing loss, nosebleeds, sore throat and tinnitus.   Eyes: Negative for blurred vision, double vision, photophobia, pain, discharge and redness.  Respiratory: Negative for cough, hemoptysis, sputum production, shortness of breath, wheezing and stridor.   Cardiovascular: Positive for chest pain. Negative for palpitations, orthopnea, claudication, leg swelling and PND.  Gastrointestinal: Negative for heartburn, nausea, vomiting, abdominal pain, diarrhea, constipation, blood in stool and melena.  Genitourinary: Negative for dysuria, urgency, frequency, hematuria and flank pain.  Musculoskeletal: Negative for myalgias, back pain, joint pain, falls and neck pain.  Skin: Negative for itching and rash.  Neurological: Negative for dizziness, tingling, tremors, sensory change, speech change, focal weakness, seizures, loss of consciousness, weakness and headaches.  Endo/Heme/Allergies: Negative for environmental  allergies and polydipsia. Does not bruise/bleed easily.  Psychiatric/Behavioral: Negative for depression, suicidal ideas, hallucinations, memory loss and substance abuse. The patient is not nervous/anxious and does not have insomnia.     Blood pressure 142/98, pulse 63, temperature 98.1 F (36.7 C), temperature source Oral, resp. rate 15, height _0  (1.626 m), weight 59.875 kg (132 lb), SpO2 96 %. Physical Exam  Constitutional: She is oriented to person, place, and time. She  appears well-developed and well-nourished.  HENT:  Head: Normocephalic and atraumatic.  Eyes: Conjunctivae and EOM are normal. Pupils are equal, round, and reactive to light.  Neck: Normal range of motion. Neck supple. No JVD present. No tracheal deviation present. No thyromegaly present.  Cardiovascular: Normal rate and regular rhythm.  Exam reveals no gallop and no friction rub.   No murmur heard. Respiratory: Effort normal and breath sounds normal. No respiratory distress. She has no wheezes. She has no rales.  GI: Soft. Bowel sounds are normal. She exhibits no distension. There is no tenderness. There is no rebound and no guarding.  Musculoskeletal: Normal range of motion. She exhibits no edema or tenderness.  Lymphadenopathy:    She has no cervical adenopathy.  Neurological: She is alert and oriented to person, place, and time. She has normal reflexes. She displays normal reflexes. No cranial nerve deficit. She exhibits normal muscle tone. Coordination normal.  Skin: Skin is warm and dry. No rash noted. No erythema. No pallor.  Psychiatric: She has a normal mood and affect. Her behavior is normal. Judgment and thought content normal.     Assessment/Plan Cp Pt will be placed on telemetry Cycle cardiac markers NPO after midnite CT scan chest, cant use iv dye due to allergy Ntp , aspirin, statin, b blocker  CAD If cardiac markers are negative then nuclear stress testing in am  Hypertension Cont current  tx  Anemia Check cbc in am    Jani Gravel 02/07/2014, 12:27 AM

## 2014-02-07 NOTE — Progress Notes (Signed)
UR completed 

## 2014-02-07 NOTE — Progress Notes (Signed)
Treadmill and Lexiscan done without comps.  Baseline ST depression and LVH.  The Patient walked on treadmill without chest pain  But failed to reach target heart rate.  With Lexiscan significant chest pain and nausea with worsening symptoms and ST depression. Awaiting images.  Darden PalmerW. Spencer Tilley, Jr. MD [facility]

## 2014-02-07 NOTE — Progress Notes (Signed)
Pt having sharp shooting pain in both shoulders; level 5. VSS. 12 Lead EKG performed; no changes noted. Pt given scheduled Nitropaste; pain improved. M.D paged and made aware. STAT Troponin X 3 ordered.   Pt also having pain on the side of her face. No facial droop noted; no other neurological changes witnessed. Will cont to monitor.

## 2014-02-07 NOTE — Consult Note (Signed)
Cardiology Consult Note  Admit date: 02/06/2014 Name: Karen Saunders 59 y.o.  female DOB:  1955/05/08 MRN:  [id number]  Today's date:  02/07/2014  Referring Physician:    [facility]  Primary Physician:   Dr. Raechel Ache  Reason for Consultation:   Chest pain   IMPRESSIONS: 1.  Somewhat severe chest pain not associated with troponin elevation and no evidence of ischemia on myocardial perfusion imaging.  The discomfort is different than her previous angina that led to her bypass grafting and unable to ascertain exactly what the cause of it is.  Because of the severe nature of the discomfort would suggest observation an additional night since it was reproduced by Lexus scan at the time of stress testing.  If she is pain free in the morning and has a good night tonight.  I think she could go home with close follow-up.  Her bypass grafts or 59 years old and if she has recurrent pain would probably consider catheterization as the next step for evaluation. 2.  Coronary artery disease with previous bypass grafting. 3.  Hypertensive heart disease. 4.  Hyperlipidemia 5.  History of gastric ulcer. 6.  History of anemia. 7.  Prior cervical disc disease  RECOMMENDATION: She had a negative myocardial perfusion scan, although she did have significant ST depression as well as recurrence of chest pain  with Lexiscan.  In light of this, I would continue her current medicines and I would keep her one more additional night for observation.  If she is stable in the morning.  Consider discharge in the morning.  HISTORY: This 59 year old female has a history of coronary artery disease with bypass grafting in November 2002 for angina.  She had an occluded LAD and right coronary artery and severe circumflex disease at the time.  She has done well over the years, but is had periodic chest pain intermittently.  She developed chest discomfort.  This started yesterday morning around 8 described as a pressure  pain with radiation to her back.  She took some nitroglycerin relief after about 1 minute.  The discomfort recurred and became severe and she again took nitroglycerin and she was admitted to the hospital.  Troponins have been negative.  She has been pain-free since she was admitted.  I was asked to see her this morning and then was called from nuclear medicine where she had been set up to have a stress test by the hospitalist.  I did the stress test on her.  She had no angina with exercise but failed to reach her target heart rate.  She did have recurrence of the pressure pain when given Lexiscan requiring relief with nitroglycerin.  Her stress images were negative for ischemia.  Her ejection fraction was 78%.  She has had chest pain since then and feels well now.  She has been having some sinus problems as well as a postnasal drip and cough.  She is scheduled to have an MRI of her sinus.  She thinks the pain is different than her previous anginal pain prior to bypass.  She has no PND, orthopnea, or edema.  EKGs in the chart reviewed and are similar to prior EKGs.   Past Medical History  Diagnosis Date  . Hypertension   . Hyperlipidemia   . Osteoporosis 11/18/2004  . Migraine     Unsp w/o intract w/o status migrainosus  . IBS (irritable bowel syndrome)   . CAD (coronary artery disease)     Dr. Donnie Aho with  Cards  . Leukopenia     with prev unremarkable heme eval ~2011  . Anemia     with prev unremarkable heme eval ~2011  . Shingles   . Trigeminal neuralgia 03/05/2012  . History of gastric ulcer     2005       Past Surgical History  Procedure Laterality Date  . Stress cardiolite  09/11/2008    Probably normal with breast attenuation noted but no evidence of ischemia  . Abdominal hysterectomy  2002  . Tonsillectomy  ~ 1965  . Coronary artery bypass graft  2002     Allergies:  is allergic to amoxicillin-pot clavulanate; doxycycline; ibandronate sodium; iohexol; raloxifene; sulfonamide  derivatives; and zoledronic acid.   Medications: Prior to Admission medications   Medication Sig Start Date End Date Taking? Authorizing Provider  amLODipine (NORVASC) 5 MG tablet Take 5 mg by mouth daily.   Yes Historical Provider, MD  aspirin 81 MG tablet Take 81 mg by mouth daily.     Yes Historical Provider, MD  atorvastatin (LIPITOR) 80 MG tablet Take 80 mg by mouth daily.   Yes Historical Provider, MD  celecoxib (CELEBREX) 100 MG capsule Take 100 mg by mouth 2 (two) times daily. Taken rarely   Yes Historical Provider, MD  dexlansoprazole (DEXILANT) 60 MG capsule Take 60 mg by mouth daily.   Yes Historical Provider, MD  dicyclomine (BENTYL) 10 MG capsule TAKE 1 CAPSULE (10 MG TOTAL) BY MOUTH 4 (FOUR) TIMES DAILY - BEFORE MEALS AND AT BEDTIME. Patient taking differently: Take 10 mg by mouth 4 (four) times daily -  before meals and at bedtime.  11/27/13  Yes Joaquim NamGraham S Duncan, MD  ezetimibe (ZETIA) 10 MG tablet Take 10 mg by mouth daily.     Yes Historical Provider, MD  levocetirizine (XYZAL) 5 MG tablet Take 5 mg by mouth every evening.   Yes Historical Provider, MD  metoprolol (LOPRESSOR) 50 MG tablet Take 50 mg by mouth 2 (two) times daily.     Yes Historical Provider, MD  pantoprazole (PROTONIX) 40 MG tablet Take 40 mg by mouth daily.   Yes Historical Provider, MD  valsartan-hydrochlorothiazide (DIOVAN-HCT) 320-25 MG per tablet Take 1 tablet by mouth daily.   Yes Historical Provider, MD  guaiFENesin-codeine (CHERATUSSIN AC) 100-10 MG/5ML syrup Take 5 mLs by mouth 3 (three) times daily as needed for cough. Patient not taking: Reported on 02/06/2014 01/29/14   Joaquim NamGraham S Duncan, MD  levocetirizine (XYZAL) 5 MG tablet TAKE 1 TABLET BY MOUTH EVERY DAY FOR ALLERGIES 01/08/14   Joaquim NamGraham S Duncan, MD  levofloxacin (LEVAQUIN) 500 MG tablet Take 1 tablet (500 mg total) by mouth daily. Patient not taking: Reported on 02/06/2014 01/15/14   Joaquim NamGraham S Duncan, MD  loratadine-pseudoephedrine (CLARITIN-D 24 HOUR)  10-240 MG per 24 hr tablet Take 1 tablet by mouth daily. Patient not taking: Reported on 02/06/2014 11/12/13   Joaquim NamGraham S Duncan, MD  pantoprazole (PROTONIX) 40 MG tablet TAKE 1 TABLET TWICE A DAY 30MINS PRIOR TO A MEAL 11/08/13   Joaquim NamGraham S Duncan, MD  Pediatric Multivit-Minerals-C (RA GUMMY VITAMINS & MINERALS PO) Take 1 ampule by mouth daily.      Historical Provider, MD  traMADol (ULTRAM) 50 MG tablet Take 1 tablet (50 mg total) by mouth every 8 (eight) hours as needed. Patient not taking: Reported on 02/06/2014 01/17/14   Joaquim NamGraham S Duncan, MD    Family History: Family Status  Relation Status Death Age  . Mother Alive  DJD  . Father Deceased 38    MI, CVA  . Sister Alive     2, healthy  . Brother Alive     healthy  . Child Alive     1, alive and well    Social History:   reports that she has never smoked. She has never used smokeless tobacco. She reports that she does not drink alcohol or use illicit drugs.   History   Social History Narrative   From [employer].   Education:  14 years   Regular Exercise:  Yes   Enjoys time at church and jazz music    Review of Systems: Other than as noted above remainder of the review of systems is unremarkable.  Physical Exam: BP 115/91 mmHg  Pulse 71  Temp(Src) 98.2 F (36.8 C) (Oral)  Resp 16  Ht  (1.575 m)  Wt 63.7 kg (140 lb 6.9 oz)  BMI 25.68 kg/m2  SpO2 99%  General appearance: Pleasant black female in no acute distress Head: Normocephalic, without obvious abnormality, atraumatic Neck: no adenopathy, no carotid bruit, no JVD and supple, symmetrical, trachea midline Lungs: clear to auscultation bilaterally Heart: regular rate and rhythm, S1, S2 normal, no murmur, click, rub or gallop Pelvic: deferred Extremities: extremities normal, atraumatic, no cyanosis or edema Pulses: 2+ and symmetric Neurologic: Grossly normal   Labs: CBC  Recent Labs  02/06/14 2223  WBC 2.4*  RBC 3.80*  HGB 11.4*  HCT 33.2*  PLT 154   MCV 87.4  MCH 30.0  MCHC 34.3  RDW 13.8   CMP   Recent Labs  02/06/14 2223  NA 138  K 3.8  CL 103  CO2 27  GLUCOSE 111*  BUN 17  CREATININE 1.06  CALCIUM 9.7  GFRNONAA 56*  GFRAA 65*   BNP (last 3 results) No results for input(s): PROBNP in the last 8760 hours. Cardiac Panel (last 3 results)  Recent Labs  02/07/14 0717  TROPONINI <0.03     Radiology: No acute cardiopulmonary process, CT scan shows a left thyroid nodule, basilar atelectasis, coronary calcifications, but no acute abnormality  EKG: Voltage for LVH with anterolateral T wave inversions that has been present on previous EKGs  STRESS TEST: She walked around 6 minutes, but was not able to reach her target heart rate.  She was given Lexiscan , which causes significant amount of chest pain and ST depression.  She had no ischemia noted on imaging.  Ejection fraction was 78%.    Signed:  Darden Palmer MD [facility]   Cardiology Consultant  02/07/2014, 5:10 PM

## 2014-02-07 NOTE — Progress Notes (Signed)
Patient returned from nuclear med.  No complaints of pain, VSS.  Patient refuses nitro paste at this time.  Will continue to monitor closely.

## 2014-02-07 NOTE — Progress Notes (Signed)
Patient c/o bilateral shoulder pain. Denies radiation into her back, jaw or arm. MD on call paged and orders received for Tylenol. RN will continue to monitor.

## 2014-02-08 MED ORDER — FLUTICASONE PROPIONATE 50 MCG/ACT NA SUSP: 1.0000 | Freq: Every day | NASAL | Status: DC

## 2014-02-08 NOTE — Discharge Summary (Signed)
Physician Discharge Summary  Billey GoslingBetty L Hartin ZOX:[id number] DOB: 05/29/1955 DOA: 02/06/2014  PCP: Crawford GivensGraham Duncan, MD  Admit date: 02/06/2014 Discharge date: 02/08/2014  Time spent: 35 minutes  Recommendations for Outpatient Follow-up:  Patient will be discharged home. She is follow up with her primary care physician as well as Dr. Donnie Ahoilley within 1-2 weeks of discharge. Patient to continue her medications as prescribed. Patient's [place] heart healthy diet. She should resume activity as tolerated.  Discharge Diagnoses:  Chest pain/CAD Essential hypertension Sinus congestion Normocytic anemia Hyperlipidemia  Discharge Condition: Stable  Diet recommendation: Heart healthy  Filed Weights   02/06/14 2227 02/07/14 0315 02/08/14 0418  Weight: 59.875 kg (132 lb) 63.7 kg (140 lb 6.9 oz) 62.2 kg (137 lb 2 oz)    History of present illness:  on 02/07/2014 by Dr. Pearson GrippeJames Kim 59 yo female with htn, hyperlipidemia, cad s/p CABG apparently c/o chest pain starting this morning about 8am. sscp "pressure" with some radiation to the back. Denies fever, chills, cough , sob, diaphoresis. Took slg nitro with some relief after about 1 minute per pt. EKG showed nsr with t wave inversion in 2, 3, avf, v3-6. CXR negative for any acute process. ED contacted cardiology who requested that medicine admit the patient and ED was not certain if cardiology will be by in am. Pt will be admitted for w/up of chest pain.   Hospital Course:  Chest pain/CAD -Troponins cycled and negative -Cardiology consulted and appreciated (Dr. Donnie Ahoilley) -Currently chest pain free, nitropaste in place -Continue aspirin, statin, ARB, BB -Stress test: baseline ST depression and LVH, without chest pain -Lexiscan: significant chest pain, nausea, worsening of ST depression; Images: No reversible ischemia or infarction, EF 78% -Cardiology recommended close followup as patient is currently chest pain free and negative myocardial perfusion  scan.  Continue current meds.  Essential Hypertension -Stable -Continue HCTZ, amloidipine, metoprolol, irbesartan  Sinus congestion -Continue flonase -Patient has an outpatient MRI of the orbits scheduled  Normocytic Anemia -Hb appears to be at baseline, 11  Hyperlipidemia -Continue statin  Procedures: Stress test/lexiscan  Consultations: Cardiology  Discharge Exam: Filed Vitals:   02/08/14 0418  BP: 114/70  Pulse: 66  Temp: 97.8 F (36.6 C)  Resp: 20     General: Well developed, well nourished, NAD, appears stated age  HEENT: NCAT, mucous membranes moist.  Cardiovascular: S1 S2 auscultated, no rubs, murmurs or gallops. Regular rate and rhythm.  Respiratory: Clear to auscultation bilaterally with equal chest rise  Abdomen: Soft, nontender, nondistended, + bowel sounds  Extremities: warm dry without cyanosis clubbing or edema  Neuro: AAOx3, nonfocal  Psych: Normal affect and demeanor with intact judgement and insight  Discharge Instructions      Discharge Instructions    Discharge instructions    Complete by:  As directed   Patient will be discharged home. She is follow up with her primary care physician as well as Dr. Donnie Ahoilley within 1-2 weeks of discharge. Patient to continue her medications as prescribed. Patient's [place] heart healthy diet. She should resume activity as tolerated.            Medication List    STOP taking these medications        levofloxacin 500 MG tablet  Commonly known as:  LEVAQUIN     loratadine-pseudoephedrine 10-240 MG per 24 hr tablet  Commonly known as:  CLARITIN-D 24 HOUR      TAKE these medications        amLODipine 5 MG tablet  Commonly  known as:  NORVASC  Take 5 mg by mouth daily.     aspirin 81 MG tablet  Take 81 mg by mouth daily.     atorvastatin 80 MG tablet  Commonly known as:  LIPITOR  Take 80 mg by mouth daily.     celecoxib 100 MG capsule  Commonly known as:  CELEBREX  Take 100 mg by mouth 2  (two) times daily. Taken rarely     dexlansoprazole 60 MG capsule  Commonly known as:  DEXILANT  Take 60 mg by mouth daily.     dicyclomine 10 MG capsule  Commonly known as:  BENTYL  TAKE 1 CAPSULE (10 MG TOTAL) BY MOUTH 4 (FOUR) TIMES DAILY - BEFORE MEALS AND AT BEDTIME.     ezetimibe 10 MG tablet  Commonly known as:  ZETIA  Take 10 mg by mouth daily.     fluticasone 50 MCG/ACT nasal spray  Commonly known as:  FLONASE  Place 1 spray into both nostrils daily.     guaiFENesin-codeine 100-10 MG/5ML syrup  Commonly known as:  CHERATUSSIN AC  Take 5 mLs by mouth 3 (three) times daily as needed for cough.     levocetirizine 5 MG tablet  Commonly known as:  XYZAL  Take 5 mg by mouth every evening.     levocetirizine 5 MG tablet  Commonly known as:  XYZAL  TAKE 1 TABLET BY MOUTH EVERY DAY FOR ALLERGIES     metoprolol 50 MG tablet  Commonly known as:  LOPRESSOR  Take 50 mg by mouth 2 (two) times daily.     pantoprazole 40 MG tablet  Commonly known as:  PROTONIX  Take 40 mg by mouth daily.     RA GUMMY VITAMINS & MINERALS PO  Take 1 ampule by mouth daily.     traMADol 50 MG tablet  Commonly known as:  ULTRAM  Take 1 tablet (50 mg total) by mouth every 8 (eight) hours as needed.     valsartan-hydrochlorothiazide 320-25 MG per tablet  Commonly known as:  DIOVAN-HCT  Take 1 tablet by mouth daily.       Allergies  Allergen Reactions  . Amoxicillin-Pot Clavulanate     REACTION: Rash on tongue  . Doxycycline     GI intolerance  . Ibandronate Sodium     REACTION: GI side effects  . Iohexol      Desc: HIVES,NASAL CONGESTION DURING A CARDIAC CATH. REQUIRES PRE-MEDS.   . Raloxifene     REACTION: GI upset  . Sulfonamide Derivatives     REACTION: itching  . Zoledronic Acid     REACTION: unable to take this as she was intolerant of pre-tx calcium and vitamin D   Follow-up Information    Follow up with Crawford Givens, MD. Schedule an appointment as soon as possible for a  visit in 1 week.   Specialty:  Family Medicine   Why:  Hospital follow up   Contact information:   [address] [place] [address] [phone]       Follow up with Darden Palmer, MD. Schedule an appointment as soon as possible for a visit in 1 week.   Specialty:  Cardiology   Why:  Hospital followup   Contact information:   [address] [place] [address] [phone]        The results of significant diagnostics from this hospitalization (including imaging, microbiology, ancillary and laboratory) are listed below for reference.  Significant Diagnostic Studies: Dg Chest 2 View  02/06/2014   CLINICAL DATA:  Acute onset of mid chest pain, shooting to the upper back. Nausea. Initial encounter.  EXAM: CHEST  2 VIEW  COMPARISON:  Chest radiograph performed 04/20/2012  FINDINGS: The lungs are well-aerated and clear. There is no evidence of focal opacification, pleural effusion or pneumothorax.  The heart is normal in size; the patient is status post median sternotomy. No acute osseous abnormalities are seen.  IMPRESSION: No acute cardiopulmonary process seen.   Electronically Signed   By: Roanna Raider M.D.   On: 02/06/2014 22:47   Ct Chest Wo Contrast  02/07/2014   CLINICAL DATA:  Severe mid chest pain, radiating to the back, acute onset. Initial encounter.  EXAM: CT CHEST WITHOUT CONTRAST  TECHNIQUE: Multidetector CT imaging of the chest was performed following the standard protocol without IV contrast.  COMPARISON:  CT of the chest performed 10/16/2003, and chest radiograph performed 02/06/2014  FINDINGS: Minimal bilateral atelectasis is seen. The lungs are otherwise clear. No focal consolidation, pleural effusion or pneumothorax is seen. A small bleb is noted at the left midlung zone, within the left upper lobe. No suspicious masses are identified.  The heart is normal in size. Diffuse coronary artery calcifications are seen. Postoperative change is  noted about the mediastinum, and the patient is status post median sternotomy. The great vessels are grossly unremarkable. No pericardial effusion is identified. No mediastinal lymphadenopathy is seen.  There is apparent nodularity of the left thyroid lobe, measuring approximately 1.9 cm. This may simply reflect an unusual appearing thyroid lobe, better characterized than on the prior study.  The visualized portions of the liver and spleen are unremarkable. A 1.4 cm cyst is noted at the upper pole of the left kidney.  No acute osseous abnormalities are seen.  IMPRESSION: 1. No acute abnormality seen to explain patient's symptoms. 2. Minimal bilateral atelectasis seen. Small bleb at the left midlung zone, within the left upper lobe. Lungs otherwise clear. 3. Diffuse coronary artery calcification noted. 4. Apparent nodularity of the left thyroid lobe, measuring approximately 1.9 cm. Consider further evaluation with thyroid ultrasound. If patient is clinically hyperthyroid, consider nuclear medicine thyroid uptake and scan. 5. Small left renal cyst seen.   Electronically Signed   By: Roanna Raider M.D.   On: 02/07/2014 01:42   Nm Myocar Multi W/spect W/wall Motion / Ef  02/07/2014   CLINICAL DATA:  Chest pain.  EXAM: MYOCARDIAL IMAGING WITH SPECT (REST AND PHARMACOLOGIC-STRESS)  GATED LEFT VENTRICULAR WALL MOTION STUDY  LEFT VENTRICULAR EJECTION FRACTION  TECHNIQUE: Standard myocardial SPECT imaging was performed after resting intravenous injection of 10 mCi Tc-15m sestamibi. Subsequently, intravenous infusion of Lexiscan was performed under the supervision of the Cardiology staff. At peak effect of the drug, 30 mCi Tc-43m sestamibi was injected intravenously and standard myocardial SPECT imaging was performed. Quantitative gated imaging was also performed to evaluate left ventricular wall motion, and estimate left ventricular ejection fraction.  COMPARISON:  None.  FINDINGS: Perfusion: No decreased activity in the  left ventricle on stress imaging to suggest reversible ischemia or infarction.  Wall Motion: Normal left ventricular wall motion. No left ventricular dilation.  Left Ventricular Ejection Fraction: 78 %  End diastolic volume 52 ml  End systolic volume 11 ml  IMPRESSION: 1. No reversible ischemia or infarction.  2. Normal left ventricular wall motion.  3. Left ventricular ejection fraction 78%  4. Low-risk stress test findings*.  *2012 Appropriate Use Criteria for  Coronary Revascularization Focused Update: J Am Coll Cardiol. 2012;59(9):857-881. http://content.dementiazones.com.aspx?articleid=1201161   Electronically Signed   By: Loralie Champagne M.D.   On: 02/07/2014 15:35   Ct Maxillofacial Ltd Wo Cm  01/15/2014   CLINICAL DATA:  Chronic sinusitis, left-sided facial tenderness, nasal congestion  EXAM: CT PARANASAL SINUS LIMITED WITHOUT CONTRAST  TECHNIQUE: Non-contiguous multidetector CT images of the paranasal sinuses were obtained in a single plane without contrast.  COMPARISON:  Limited CT paranasal sinus exam of 04/30/2010  FINDINGS: The paranasal sinuses are well pneumatized. Only minimal mucosal thickening is present in the floor both maxillary sinuses. No air-fluid level is seen. There is slight nasal septal deviation to the right of midline. The nasal turbinates are normal in size and position.  IMPRESSION: No sinusitis. Minimal mucosal thickening in the floor of the maxillary sinuses. Some nasal septal deviation to the right.   Electronically Signed   By: Dwyane Dee M.D.   On: 01/15/2014 10:06    Microbiology: Recent Results (from the past 240 hour(s))  MRSA PCR Screening     Status: None   Collection Time: 02/07/14  3:52 AM  Result Value Ref Range Status   MRSA by PCR NEGATIVE NEGATIVE Final    Comment:        The GeneXpert MRSA Assay (FDA approved for NASAL specimens only), is one component of a comprehensive MRSA colonization surveillance program. It is not intended to diagnose  MRSA infection nor to guide or monitor treatment for MRSA infections.      Labs: Basic Metabolic Panel:  Recent Labs Lab 02/06/14 2223  NA 138  K 3.8  CL 103  CO2 27  GLUCOSE 111*  BUN 17  CREATININE 1.06  CALCIUM 9.7   Liver Function Tests: No results for input(s): AST, ALT, ALKPHOS, BILITOT, PROT, ALBUMIN in the last 168 hours. No results for input(s): LIPASE, AMYLASE in the last 168 hours. No results for input(s): AMMONIA in the last 168 hours. CBC:  Recent Labs Lab 02/06/14 2223  WBC 2.4*  HGB 11.4*  HCT 33.2*  MCV 87.4  PLT 154   Cardiac Enzymes:  Recent Labs Lab 02/07/14 0717  TROPONINI <0.03   BNP: BNP (last 3 results) No results for input(s): PROBNP in the last 8760 hours. CBG: No results for input(s): GLUCAP in the last 168 hours.     SignedEdsel Petrin  [facility] 02/08/2014, 9:44 AM

## 2014-02-08 NOTE — Progress Notes (Signed)
UR completed 

## 2014-02-08 NOTE — Discharge Instructions (Signed)

## 2014-02-08 NOTE — Progress Notes (Signed)
Subjective:  No recurrent chest pain overnight.  She did have some slight bilateral shoulder pain which was different than her previous angina or the chest pain that came in with.  Wants to go home.  Objective:  Vital Signs in the last 24 hours: BP 114/70 mmHg  Pulse 66  Temp(Src) 97.8 F (36.6 C) (Oral)  Resp 20  Ht 5\' 2"  (1.575 m)  Wt 62.2 kg (137 lb 2 oz)  BMI 25.07 kg/m2  SpO2 100%  Physical Exam: Pleasant black female in no acute distress Lungs:  Clear Cardiac:  Regular rhythm, normal S1 and S2, no S3  Intake/Output from previous day: 01/29 0701 - 01/30 0700 In: 1083.8 [P.O.:290; I.V.:793.8] Out: 1500 [Urine:1500]  Weight Filed Weights   02/06/14 2227 02/07/14 0315 02/08/14 0418  Weight: 59.875 kg (132 lb) 63.7 kg (140 lb 6.9 oz) 62.2 kg (137 lb 2 oz)    Lab Results: Basic Metabolic Panel:  Recent Labs  16/10/9599/28/16 2223  NA 138  K 3.8  CL 103  CO2 27  GLUCOSE 111*  BUN 17  CREATININE 1.06   CBC:  Recent Labs  02/06/14 2223  WBC 2.4*  HGB 11.4*  HCT 33.2*  MCV 87.4  PLT 154   Cardiac Enzymes:  Recent Labs  02/07/14 0717  TROPONINI <0.03    Telemetry: Sinus rhythm  Assessment/Plan:  1.  Prolonged chest pain without troponin elevation and no evidence of ischemia on myocardial perfusion testing.  Her bypass grafts are around 61 years old.  If she has recurrent pain.  Would have a low threshold for catheterization.  I think she can go home today and have asked her to follow-up with me in the next one to 2 weeks.   Darden PalmerW. Spencer Shaymus Eveleth, Jr.  MD [facility]  02/08/2014, 10:35 AM

## 2014-02-10 ENCOUNTER — Encounter: Payer: Self-pay | Admitting: Family Medicine

## 2014-02-13 ENCOUNTER — Ambulatory Visit
Admission: RE | Admit: 2014-02-13 | Discharge: 2014-02-13 | Disposition: A | Discharge status: Home / Self Care | Payer: 59 | Source: Ambulatory Visit | Attending: Otolaryngology | Admitting: Otolaryngology

## 2014-02-13 DIAGNOSIS — R52 Pain, unspecified: Secondary | ICD-10-CM

## 2014-02-13 DIAGNOSIS — R200 Anesthesia of skin: Secondary | ICD-10-CM

## 2014-02-13 MED ORDER — GADOBENATE DIMEGLUMINE 529 MG/ML IV SOLN: 13.0000 mL | Freq: Once | INTRAVENOUS | Status: AC | PRN

## 2014-02-18 ENCOUNTER — Ambulatory Visit: Payer: 59 | Admitting: Family Medicine

## 2014-02-18 ENCOUNTER — Encounter: Payer: Self-pay | Admitting: Family Medicine

## 2014-02-20 ENCOUNTER — Telehealth: Payer: Self-pay | Admitting: Family Medicine

## 2014-02-20 DIAGNOSIS — R519 Headache, unspecified: Secondary | ICD-10-CM

## 2014-02-20 DIAGNOSIS — R51 Headache: Principal | ICD-10-CM

## 2014-02-20 NOTE — Telephone Encounter (Signed)
Referral placed.  See mychart message.  Had seen Dr. Lewie Chamberat prev.

## 2014-02-25 ENCOUNTER — Ambulatory Visit (INDEPENDENT_AMBULATORY_CARE_PROVIDER_SITE_OTHER): Payer: 59 | Admitting: Neurology

## 2014-02-25 ENCOUNTER — Encounter: Payer: Self-pay | Admitting: Neurology

## 2014-02-25 VITALS — BP 124/72 | HR 68 | Ht 64.0 in | Wt 141.0 lb

## 2014-02-25 DIAGNOSIS — G500 Trigeminal neuralgia: Secondary | ICD-10-CM

## 2014-02-25 DIAGNOSIS — Z5181 Encounter for therapeutic drug level monitoring: Secondary | ICD-10-CM

## 2014-02-25 MED ORDER — OXCARBAZEPINE 300 MG PO TABS: 300.0000 mg | ORAL_TABLET | Freq: Two times a day (BID) | ORAL | Status: DC

## 2014-02-25 MED ORDER — OXCARBAZEPINE 150 MG PO TABS: 150.0000 mg | ORAL_TABLET | Freq: Two times a day (BID) | ORAL | Status: DC

## 2014-02-25 NOTE — Progress Notes (Signed)
Karen Saunders was seen today in neurologic consultation at the request of Crawford Givens, MD.  The consultation is for the evaluation of facial pain and to rule out trigeminal neuralgia.  She previously had an ENT evaluation as well as a dental evaluation, both of which were unrevealing.  The patient reports that she has had this L facial pain for over a year.  The patient reports a gradual onset of the pain and she attributed it to sinuses and she got nose sprays and antibiotics for quite some time without relief.  She then had the ENT eval that was negative.   The pain is intermittent; it generally will wake her up at night.  She may go up to a month or two without the pain.  She desribes it as a stabbing pain.  It involves the entire L check and maybe a "strain" in the eye but no eye pain.  Laying down makes it worse.  Weather changes do not seem to affect the pain, nor does wind or brushing teeth.     06/19/12:  She follows up today.  Last visit, we started her on Trileptal.  This did seem to help.  Not long ago, she tried to wean off of it because she was having lower extremity edema and wondered if it was from the Trileptal.  However, the pain in the face came back when she was off the medicaation.  She did not notice any change in the edema when she was off of the medication, but per her previous notes from other providers, the edema was asymmetric in involving the leg with the prior saphenous vein graft.  She states that the R leg edema is better than it previously was, but seems to get worse as her legs are held antigravity throughout the day.  02/25/14 update:  Karen Saunders is following up today.  She has not been seen since 2014.  The records that were made available to me were reviewed since that time.  At that time, her TN was well controlled and I weaned her off of trileptal.   She did well off of the medication until December, 2015.  It is a pain, throbbing in quality, over the L face.  It is over the L  zygomaticus.  She has been taking motrin, tylenol, "sinus medication", mucinex, goody powders to try and relieve the patient.  The cold wind/weather doesn't make it worse.  She states that it is there all day long but the intensity changes. She was seen by Dr. Jearld Fenton of ENT and she was told that it was not a sinus    PREVIOUS MEDICATIONS: none  ALLERGIES:   Allergies  Allergen Reactions  . Amoxicillin-Pot Clavulanate     REACTION: Rash on tongue  . Doxycycline     GI intolerance  . Ibandronate Sodium     REACTION: GI side effects  . Iohexol      Desc: HIVES,NASAL CONGESTION DURING A CARDIAC CATH. REQUIRES PRE-MEDS.   . Raloxifene     REACTION: GI upset  . Sulfonamide Derivatives     REACTION: itching  . Zoledronic Acid     REACTION: unable to take this as she was intolerant of pre-tx calcium and vitamin D    CURRENT MEDICATIONS:  Current Outpatient Prescriptions on File Prior to Visit  Medication Sig Dispense Refill  . amLODipine (NORVASC) 5 MG tablet Take 5 mg by mouth daily.    Marland Kitchen aspirin 81 MG tablet Take 81 mg  by mouth daily.      Marland Kitchen atorvastatin (LIPITOR) 80 MG tablet Take 80 mg by mouth daily.    Marland Kitchen dexlansoprazole (DEXILANT) 60 MG capsule Take 60 mg by mouth daily.    Marland Kitchen dicyclomine (BENTYL) 10 MG capsule TAKE 1 CAPSULE (10 MG TOTAL) BY MOUTH 4 (FOUR) TIMES DAILY - BEFORE MEALS AND AT BEDTIME. (Patient taking differently: Take 10 mg by mouth 4 (four) times daily -  before meals and at bedtime. ) 120 capsule 5  . ezetimibe (ZETIA) 10 MG tablet Take 10 mg by mouth daily.      Marland Kitchen levocetirizine (XYZAL) 5 MG tablet Take 5 mg by mouth every evening.    . metoprolol (LOPRESSOR) 50 MG tablet Take 50 mg by mouth 2 (two) times daily.      . pantoprazole (PROTONIX) 40 MG tablet Take 40 mg by mouth daily.    . valsartan-hydrochlorothiazide (DIOVAN-HCT) 320-25 MG per tablet Take 1 tablet by mouth daily.     No current facility-administered medications on file prior to visit.    PAST  MEDICAL HISTORY:   Past Medical History  Diagnosis Date  . Hypertension   . Hyperlipidemia   . Osteoporosis 11/18/2004  . Migraine     Unsp w/o intract w/o status migrainosus  . IBS (irritable bowel syndrome)   . CAD (coronary artery disease)     Dr. Donnie Aho with Cards  . Leukopenia     with prev unremarkable heme eval ~2011  . Anemia     with prev unremarkable heme eval ~2011  . Shingles   . Trigeminal neuralgia 03/05/2012  . History of gastric ulcer     2005     PAST SURGICAL HISTORY:   Past Surgical History  Procedure Laterality Date  . Stress cardiolite  09/11/2008    Probably normal with breast attenuation noted but no evidence of ischemia  . Abdominal hysterectomy  2002  . Tonsillectomy  ~ 1965  . Coronary artery bypass graft  2002    SOCIAL HISTORY:   History   Social History  . Marital Status: Divorced    Spouse Name: N/A  . Number of Children: 1  . Years of Education: N/A   Occupational History  . MEDICAID [employer]  . 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 Smoking status: Never Smoker   . Smokeless tobacco: Never Used  . Alcohol Use: No  . Drug Use: No  . Sexual Activity: Not on file   Other Topics Concern  . Not on file   Social History Narrative   From [place].   Education:  14 years   Regular Exercise:  Yes   Enjoys time at church and jazz music    FAMILY HISTORY:   Family Status  Relation Status Death Age  . Mother Alive     DJD  . Father Deceased 26    MI, CVA  . Sister Alive     2, healthy  . Brother Alive     healthy  . Child Alive     1, alive and well    ROS:  A complete 10 system review of systems was obtained and was unremarkable apart from what is mentioned above.  PHYSICAL EXAMINATION:    VITALS:   Filed Vitals:   02/25/14 0905  BP: 124/72  Pulse: 68  Height:  (1.626 m)  Weight: 141 lb (63.957 kg)   Wt Readings  from Last 3 Encounters:  02/25/14  141 lb (63.957 kg)  02/08/14 137 lb 2 oz (62.2 kg)  09/27/13 137 lb (62.143 kg)    GEN:  Normal appears female in no acute distress.  Appears stated age. HEENT:  Normocephalic, atraumatic. The mucous membranes are moist. The superficial temporal arteries are without ropiness or tenderness. Cardiovascular: Regular rate and rhythm. Lungs: Clear to auscultation bilaterally. Neck/Heme: There are no carotid bruits noted bilaterally. Exts:  There is trace edema in the R leg only  NEUROLOGICAL: Orientation:  The patient is alert and oriented x 3.  Fund of knowledge is appropriate.  Recent and remote memory intact.  Attention span and concentration normal.  Repeats and names without difficulty. Cranial nerves: There is good facial symmetry. The pupils are equal round and reactive to light bilaterally. Fundoscopic exam reveals clear disc margins bilaterally. Extraocular muscles are intact and visual fields are full to confrontational testing. Speech is fluent and clear. Soft palate rises symmetrically and there is no tongue deviation. Hearing is intact to conversational tone. Tone: Tone is good throughout. Sensation: Sensation is intact to light touch.  There is no extinction with double simultaneous stimulation. There is no sensory dermatomal level identified. Coordination:  The patient has no difficulty with RAM's or FNF bilaterally. Motor: Strength is 5/5 in the bilateral upper and lower extremities.  Shoulder shrug is equal and symmetric. There is no pronator drift.  There are no fasciculations noted. Gait and Station: The patient is able to ambulate without difficulty.   LABS:  Lab Results  Component Value Date   WBC 2.4* 02/06/2014   HGB 11.4* 02/06/2014   HCT 33.2* 02/06/2014   MCV 87.4 02/06/2014   PLT 154 02/06/2014   Lab Results  Component Value Date   TSH 2.03 09/06/2011     Chemistry      Component Value Date/Time   NA 138 02/06/2014 2223   K 3.8 02/06/2014 2223   CL 103  02/06/2014 2223   CO2 27 02/06/2014 2223   BUN 17 02/06/2014 2223   CREATININE 1.06 02/06/2014 2223      Component Value Date/Time   CALCIUM 9.7 02/06/2014 2223   ALKPHOS 99 04/06/2012 1508   AST 40* 04/06/2012 1508   ALT 34 04/06/2012 1508   BILITOT 0.3 04/06/2012 1508         IMPRESSION/PLAN:  1. Left trigeminal neuralgia.  -She presents today with virtually the exact same description of pain that she did in February, 2014.  In fact, I read her my note from that visit.    -Trileptal worked well previously and we are going to reinitiate that.  She will take 150 mg twice a day for 4 days and then increase to 300 mg twice a day.  I will need a BMP in about 2 weeks.  Risks, benefits, side effects and alternative therapies were discussed.  The opportunity to ask questions was given and they were answered to the best of my ability.  The patient expressed understanding and willingness to follow the outlined treatment protocols.  -Talked to patient again about fact that this disease sx's generally re-occur.  Talked to her about pathophysiology.  Pt education provided.   2.  History of leukopenia.  -This has been stable over the course of time and the patient had a hematologic evaluation in 2011 that was unremarkable. 3.  I asked the patient to call me with any questions or concerns before her next visit.  If she is  doing well, then we'll plan on seeing her back in the next 3 months.

## 2014-02-25 NOTE — Patient Instructions (Signed)
1. Start Trileptal 150 mg - twice daily for 4 days, then switch to 300 mg tablets twice daily. Both prescriptions have been sent to your pharmacy.  2. Your provider has requested that you have labwork completed 2 weeks from today. [facility] on [facility] first floor of this building in 2 weeks to have labs drawn.

## 2014-02-26 ENCOUNTER — Encounter: Payer: Self-pay | Admitting: Family Medicine

## 2014-02-27 ENCOUNTER — Other Ambulatory Visit: Payer: Self-pay | Admitting: Family Medicine

## 2014-02-27 MED ORDER — FLUCONAZOLE 150 MG PO TABS: 150.0000 mg | ORAL_TABLET | Freq: Once | ORAL | Status: DC

## 2014-03-04 ENCOUNTER — Other Ambulatory Visit: Payer: Self-pay | Admitting: Family Medicine

## 2014-03-04 ENCOUNTER — Telehealth: Payer: Self-pay | Admitting: Neurology

## 2014-03-04 MED ORDER — GABAPENTIN 100 MG PO CAPS: 100.0000 mg | ORAL_CAPSULE | Freq: Three times a day (TID) | ORAL | Status: DC

## 2014-03-04 NOTE — Telephone Encounter (Signed)
She can discontinue the Trileptal and instead try gabapentin 100mg  three times daily.  She should call in a week with update (or sooner if having side effects)

## 2014-03-04 NOTE — Telephone Encounter (Signed)
Left message on machine for patient to call back.

## 2014-03-04 NOTE — Telephone Encounter (Signed)
Spoke with patient and advised to stop Trileptal and start Gabapentin. RX sent to pharmacy. She will call in a week with an update.

## 2014-03-04 NOTE — Telephone Encounter (Signed)
Pt left message on the voice mail and needs to talk to someone about medication please call [phone]

## 2014-03-04 NOTE — Telephone Encounter (Signed)
Spoke with patient and she states since starting Trileptal she has been very groggy in the mornings. She is still on the 150 mg BID. She states that she will sleep very hard and remain very sluggish in the morning, she will feel like her head is not clear and that she can not function. Please advise.

## 2014-03-12 ENCOUNTER — Encounter: Payer: Self-pay | Admitting: Family Medicine

## 2014-03-12 ENCOUNTER — Ambulatory Visit (INDEPENDENT_AMBULATORY_CARE_PROVIDER_SITE_OTHER): Payer: 59 | Admitting: Family Medicine

## 2014-03-12 VITALS — BP 120/78 | HR 70 | Temp 98.3°F | Wt 145.2 lb

## 2014-03-12 DIAGNOSIS — R05 Cough: Secondary | ICD-10-CM

## 2014-03-12 DIAGNOSIS — R059 Cough, unspecified: Secondary | ICD-10-CM

## 2014-03-12 DIAGNOSIS — J069 Acute upper respiratory infection, unspecified: Secondary | ICD-10-CM

## 2014-03-12 LAB — POCT INFLUENZA A/B
Influenza A, POC: NEGATIVE
Influenza B, POC: NEGATIVE

## 2014-03-12 MED ORDER — HYDROCODONE-HOMATROPINE 5-1.5 MG/5ML PO SYRP: 5.0000 mL | ORAL_SOLUTION | Freq: Three times a day (TID) | ORAL | Status: DC | PRN

## 2014-03-12 NOTE — Patient Instructions (Signed)
Use the cough medicine in the meantime.  Drink plenty of fluids, take tylenol as needed, and gargle with warm salt water for your throat.  This should gradually improve.  Take care.  Let us know if you have other concerns.

## 2014-03-12 NOTE — Progress Notes (Signed)
Pre visit review using our clinic review tool, if applicable. No additional management support is needed unless otherwise documented below in the visit note.  She was changed to gabapentin, able to tolerate it BID.  That has helped her trigeminal pain some.  She was still sleepy with 300mg  per day, so she is taking 200mg  a day.    She has other sx now, different from above, cough and ST/irritated throat.  Sick contacts at work.  Sx started in the last few days.  No fevers, no body aches. Taking mucinex at work today, with some relief.    Meds, vitals, and allergies reviewed.   ROS: See HPI.  Otherwise, noncontributory.  GEN: nad, alert and oriented HEENT: mucous membranes moist, tm w/o erythema, nasal exam w/o erythema, clear discharge noted,  OP with cobblestoning, sinuses not ttp NECK: supple w/o LA CV: rrr.   PULM: ctab, no inc wob EXT: no edema SKIN: no acute rash  Flu neg

## 2014-03-13 ENCOUNTER — Encounter: Payer: Self-pay | Admitting: Family Medicine

## 2014-03-13 DIAGNOSIS — J069 Acute upper respiratory infection, unspecified: Secondary | ICD-10-CM | POA: Insufficient documentation

## 2014-03-13 NOTE — Assessment & Plan Note (Signed)
Likely nonflu, viral.  Nontoxic.  Use cough medicine- rx'd- with sedation caution.  Supportive care o/w.  See AVS.  She agrees.

## 2014-03-14 NOTE — Telephone Encounter (Signed)
Called pt.  Would continue as is, as she is likely doing all needed/appropriate interventions. She'll update me as needed.  She agrees.

## 2014-03-17 ENCOUNTER — Telehealth: Payer: Self-pay | Admitting: Neurology

## 2014-03-17 NOTE — Telephone Encounter (Signed)
Left message on machine for patient to call back. To see if patient has had labs drawn yet. Awaiting call back.

## 2014-03-17 NOTE — Telephone Encounter (Signed)
-----   Message from Octaviano Battyebecca S Tat, DO sent at 03/17/2014  7:51 AM EST ----- Labs?  ----- Message -----    From: SYSTEM    Sent: 03/02/2014  12:04 AM      To: Octaviano Battyebecca S Tat, DO

## 2014-03-18 NOTE — Telephone Encounter (Signed)
Patient called back - states she forgot about labs but will try to go today.

## 2014-03-19 LAB — BASIC METABOLIC PANEL
BUN: 14 mg/dL (ref 6–23)
CO2: 22 mEq/L (ref 19–32)
Calcium: 9.3 mg/dL (ref 8.4–10.5)
Chloride: 104 mEq/L (ref 96–112)
Creat: 0.96 mg/dL (ref 0.50–1.10)
Glucose, Bld: 76 mg/dL (ref 70–99)
Potassium: 4 mEq/L (ref 3.5–5.3)
Sodium: 140 mEq/L (ref 135–145)

## 2014-03-24 ENCOUNTER — Encounter: Payer: Self-pay | Admitting: Family Medicine

## 2014-03-25 ENCOUNTER — Telehealth: Payer: Self-pay | Admitting: Family Medicine

## 2014-03-25 NOTE — Telephone Encounter (Signed)
Called patient.  She felt some better today.  Would continue as is.  She agrees.  Will update me as needed.

## 2014-04-03 ENCOUNTER — Other Ambulatory Visit: Payer: Self-pay | Admitting: Family Medicine

## 2014-04-03 MED ORDER — HYDROCODONE-HOMATROPINE 5-1.5 MG/5ML PO SYRP: 5.0000 mL | ORAL_SOLUTION | Freq: Three times a day (TID) | ORAL | Status: DC | PRN

## 2014-04-03 NOTE — Telephone Encounter (Signed)
Printed.  Thanks.  

## 2014-04-03 NOTE — Telephone Encounter (Signed)
Patient advised.  Rx left at front desk for pick up. 

## 2014-04-03 NOTE — Telephone Encounter (Signed)
MyChart Refill Request.  Last Filled:    120 mL 0 RF on 03/12/2014  Please advise.

## 2014-04-10 ENCOUNTER — Encounter: Payer: Self-pay | Admitting: Podiatry

## 2014-04-10 ENCOUNTER — Ambulatory Visit (INDEPENDENT_AMBULATORY_CARE_PROVIDER_SITE_OTHER): Payer: 59 | Admitting: Podiatry

## 2014-04-10 VITALS — BP 134/77 | HR 70 | Resp 12

## 2014-04-10 DIAGNOSIS — L600 Ingrowing nail: Secondary | ICD-10-CM

## 2014-04-10 MED ORDER — HYDROCODONE-ACETAMINOPHEN 10-325 MG PO TABS: 1.0000 | ORAL_TABLET | Freq: Three times a day (TID) | ORAL | Status: DC | PRN

## 2014-04-10 NOTE — Progress Notes (Signed)
Subjective:     Patient ID: Karen GoslingBetty L Saunders, female   DOB: 11/25/1955, 59 y.o.   MRN: [id number]  HPI patient presents for removal of the big toenail left stating that it's time to get this fixed as it has been so tender   Review of Systems     Objective:   Physical Exam Vascular status was found to be intact with digits well perfused and is found to have a severely incurvated thick painful hallux nail left that was painful when I pressed dorsally    Assessment:     Damage left hallux nail left with incurvation ingrown component and deformity    Plan:     Reviewed condition and recommended correction. Explain risk of procedure and today I infiltrated 60 mg Xylocaine Marcaine mixture remove the entire nail bed exposed the matrix and applied phenol 3 applications 30 seconds followed by alcohol lavaged and sterile dressing. Gave instructions on soaks and reappoint. Also placed on hydrocodone 10/13/23 for her to take to control any postoperative pain

## 2014-04-10 NOTE — Patient Instructions (Signed)

## 2014-04-14 ENCOUNTER — Telehealth: Payer: Self-pay | Admitting: *Deleted

## 2014-04-14 NOTE — Telephone Encounter (Signed)
Pt states she had and ingrown toenail procedure 04/10/2014, and is unable to wear the required work shoes due to tenderness at this time.  Pt request note informing employer of the reason she cannot wear the work shoe.

## 2014-04-16 ENCOUNTER — Encounter: Payer: Self-pay | Admitting: Podiatry

## 2014-04-18 ENCOUNTER — Encounter: Payer: Self-pay | Admitting: *Deleted

## 2014-04-18 NOTE — Telephone Encounter (Signed)
I called to inform patient that she can come by to pick up her letter.  "I came and picked it up yesterday."  Oh okay, I'm sorry.  I wasn't aware someone had taken care of it.

## 2014-04-25 ENCOUNTER — Telehealth: Payer: Self-pay | Admitting: *Deleted

## 2014-04-25 NOTE — Telephone Encounter (Signed)
Pt called states she still has a drainage and a pain from the ingrown toenail procedure.  I told pt she could have the pain and drainage for 4-6 weeks and to change to epsom salt or white vinegar soaks, use an antibiotic ointment after.  I told pt to take Tylenol or Ibuprofen OTC as directed if she was able to tolerate and make a appt.

## 2014-05-25 ENCOUNTER — Other Ambulatory Visit: Payer: Self-pay | Admitting: Family Medicine

## 2014-05-26 ENCOUNTER — Ambulatory Visit (INDEPENDENT_AMBULATORY_CARE_PROVIDER_SITE_OTHER): Payer: 59 | Admitting: Neurology

## 2014-05-26 ENCOUNTER — Encounter: Payer: Self-pay | Admitting: Neurology

## 2014-05-26 VITALS — BP 136/84 | HR 80 | Ht 64.0 in | Wt 144.0 lb

## 2014-05-26 DIAGNOSIS — G500 Trigeminal neuralgia: Secondary | ICD-10-CM

## 2014-05-26 NOTE — Progress Notes (Signed)
Karen Saunders was seen today in neurologic consultation at the request of Crawford Givens, MD.  The consultation is for the evaluation of facial pain and to rule out trigeminal neuralgia.  She previously had an ENT evaluation as well as a dental evaluation, both of which were unrevealing.  The patient reports that she has had this L facial pain for over a year.  The patient reports a gradual onset of the pain and she attributed it to sinuses and she got nose sprays and antibiotics for quite some time without relief.  She then had the ENT eval that was negative.   The pain is intermittent; it generally will wake her up at night.  She may go up to a month or two without the pain.  She desribes it as a stabbing pain.  It involves the entire L check and maybe a "strain" in the eye but no eye pain.  Laying down makes it worse.  Weather changes do not seem to affect the pain, nor does wind or brushing teeth.     06/19/12:  She follows up today.  Last visit, we started her on Trileptal.  This did seem to help.  Not long ago, she tried to wean off of it because she was having lower extremity edema and wondered if it was from the Trileptal.  However, the pain in the face came back when she was off the medicaation.  She did not notice any change in the edema when she was off of the medication, but per her previous notes from other providers, the edema was asymmetric in involving the leg with the prior saphenous vein graft.  She states that the R leg edema is better than it previously was, but seems to get worse as her legs are held antigravity throughout the day.  02/25/14 update:  [name] is following up today.  She has not been seen since 2014.  The records that were made available to me were reviewed since that time.  At that time, her TN was well controlled and I weaned her off of trileptal.   She did well off of the medication until December, 2015.  It is a pain, throbbing in quality, over the L face.  It is over the L  zygomaticus.  She has been taking motrin, tylenol, "sinus medication", mucinex, goody powders to try and relieve the patient.  The cold wind/weather doesn't make it worse.  She states that it is there all day long but the intensity changes. She was seen by Dr. Jearld Fenton of ENT and she was told that it was not a sinus  05/26/14 update:  The patient is following up today regarding her left trigeminal neuralgia.  Last visit I started her on Trileptal 150 g twice a day and worked her back up to 300 mg twice a day, which she was on previously for quite some time.  She called when she was only at the 150 mg twice a day dosing and stated that she was groggy on the medication and my partner discontinued and started her on gabapentin, 100 mg 3 times a day.  The patient stated that she was still sleepy so she backed down to 100 mg twice a day (generally does it AM and lunch and rarely at bedtime, perhaps only on a Friday when doesn't need to get up for work).  It did help the pain and she only feels the pain twice a month.  It is not as severe as  it was and she is generally very happy.  No other new medical problems.      PREVIOUS MEDICATIONS: none  ALLERGIES:   Allergies  Allergen Reactions  . Amoxicillin-Pot Clavulanate     REACTION: Rash on tongue  . Doxycycline     GI intolerance  . Ibandronate Sodium     REACTION: GI side effects  . Iohexol      Desc: HIVES,NASAL CONGESTION DURING A CARDIAC CATH. REQUIRES PRE-MEDS.   . Raloxifene     REACTION: GI upset  . Sulfonamide Derivatives     REACTION: itching  . Zoledronic Acid     REACTION: unable to take this as she was intolerant of pre-tx calcium and vitamin D    CURRENT MEDICATIONS:  Current Outpatient Prescriptions on File Prior to Visit  Medication Sig Dispense Refill  . amLODipine (NORVASC) 5 MG tablet Take 5 mg by mouth daily.    Marland Kitchen. aspirin 81 MG tablet Take 81 mg by mouth daily.      Marland Kitchen. atorvastatin (LIPITOR) 80 MG tablet Take 80 mg by mouth  daily.    Marland Kitchen. dexlansoprazole (DEXILANT) 60 MG capsule Take 60 mg by mouth daily.    Marland Kitchen. dicyclomine (BENTYL) 10 MG capsule TAKE 1 CAPSULE (10 MG TOTAL) BY MOUTH 4 (FOUR) TIMES DAILY - BEFORE MEALS AND AT BEDTIME. (Patient taking differently: Take 10 mg by mouth 4 (four) times daily -  before meals and at bedtime. ) 120 capsule 5  . ezetimibe (ZETIA) 10 MG tablet Take 10 mg by mouth daily.      Marland Kitchen. gabapentin (NEURONTIN) 100 MG capsule Take 1 capsule (100 mg total) by mouth 3 (three) times daily. (Patient taking differently: Take 100 mg by mouth 2 (two) times daily. ) 90 capsule 1  . levocetirizine (XYZAL) 5 MG tablet TAKE 1 TABLET BY MOUTH EVERY DAY 30 tablet 5  . metoprolol (LOPRESSOR) 50 MG tablet Take 50 mg by mouth 2 (two) times daily.      . pantoprazole (PROTONIX) 40 MG tablet Take 40 mg by mouth daily.    . valsartan-hydrochlorothiazide (DIOVAN-HCT) 320-25 MG per tablet Take 1 tablet by mouth daily.     No current facility-administered medications on file prior to visit.    PAST MEDICAL HISTORY:   Past Medical History  Diagnosis Date  . Hypertension   . Hyperlipidemia   . Osteoporosis 11/18/2004  . Migraine     Unsp w/o intract w/o status migrainosus  . IBS (irritable bowel syndrome)   . CAD (coronary artery disease)     Dr. Donnie Ahoilley with Cards  . Leukopenia     with prev unremarkable heme eval ~2011  . Anemia     with prev unremarkable heme eval ~2011  . Shingles   . Trigeminal neuralgia 03/05/2012  . History of gastric ulcer     2005     PAST SURGICAL HISTORY:   Past Surgical History  Procedure Laterality Date  . Stress cardiolite  09/11/2008    Probably normal with breast attenuation noted but no evidence of ischemia  . Abdominal hysterectomy  2002  . Tonsillectomy  ~ 1965  . Coronary artery bypass graft  2002    SOCIAL HISTORY:   History   Social History  . Marital Status: Divorced    Spouse Name: N/A  . Number of Children: 1  . Years of Education: N/A    Occupational History  [place] [employer] [place]  . 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 Smoking status: Never Smoker   . Smokeless tobacco: Never Used  . Alcohol Use: No  . Drug Use: No  . Sexual Activity: Not on file   Other Topics Concern  . Not on file   Social History Narrative   From [place].   Education:  14 years   Regular Exercise:  Yes   Enjoys time at church and jazz music    FAMILY HISTORY:   Family Status  Relation Status Death Age  . Mother Alive     DJD  . Father Deceased 44    MI, CVA  . Sister Alive     2, healthy  . Brother Alive     healthy  . Child Alive     1, alive and well    ROS:  A complete 10 system review of systems was obtained and was unremarkable apart from what is mentioned above.  PHYSICAL EXAMINATION:    VITALS:   Filed Vitals:   05/26/14 1008  BP: 136/84  Pulse: 80  Height:  (1.626 m)  Weight: 144 lb (65.318 kg)   Wt Readings from Last 3 Encounters:  05/26/14 144 lb (65.318 kg)  03/12/14 145 lb 4 oz (65.885 kg)  02/25/14 141 lb (63.957 kg)    GEN:  Normal appears female in no acute distress.  Appears stated age. HEENT:  Normocephalic, atraumatic. The mucous membranes are moist. The superficial temporal arteries are without ropiness or tenderness. Cardiovascular: Regular rate and rhythm. Lungs: Clear to auscultation bilaterally. Neck/Heme: There are no carotid bruits noted bilaterally. Exts:  There is trace edema in the R leg only  NEUROLOGICAL: Orientation:  The patient is alert and oriented x 3.  Fund of knowledge is appropriate.  Recent and remote memory intact.  Attention span and concentration normal.  Repeats and names without difficulty. Cranial nerves: There is good facial symmetry. The pupils are equal round and reactive to light bilaterally. Fundoscopic exam reveals clear disc margins bilaterally. Extraocular muscles are intact and visual fields  are full to confrontational testing. Speech is fluent and clear. Soft palate rises symmetrically and there is no tongue deviation. Hearing is intact to conversational tone. Tone: Tone is good throughout. Sensation: Sensation is intact to light touch.  There is no extinction with double simultaneous stimulation. There is no sensory dermatomal level identified. Coordination:  The patient has no difficulty with RAM's or FNF bilaterally. Motor: Strength is 5/5 in the bilateral upper and lower extremities.  Shoulder shrug is equal and symmetric. There is no pronator drift.  There are no fasciculations noted. Gait and Station: The patient is able to ambulate without difficulty.   LABS:  Lab Results  Component Value Date   WBC 2.4* 02/06/2014   HGB 11.4* 02/06/2014   HCT 33.2* 02/06/2014   MCV 87.4 02/06/2014   PLT 154 02/06/2014   Lab Results  Component Value Date   TSH 2.03 09/06/2011     Chemistry      Component Value Date/Time   NA 140 03/19/2014 1008   K 4.0 03/19/2014 1008   CL 104 03/19/2014 1008   CO2 22 03/19/2014 1008   BUN 14 03/19/2014 1008   CREATININE 0.96 03/19/2014 1008   CREATININE 1.06 02/06/2014 2223      Component Value Date/Time   CALCIUM 9.3 03/19/2014 1008   ALKPHOS 99 04/06/2012 1508   AST 40* 04/06/2012 1508   ALT 34 04/06/2012 1508  BILITOT 0.3 04/06/2012 1508         IMPRESSION/PLAN:  1. Left trigeminal neuralgia.  -She has presented with this both in February, 2014 and Feb 2016.    -Not completely convinced that the sleepiness was from the Trileptal, since she tolerated this medication very well in 2014.  In addition, when we changed the medication to gabapentin, she still had the same sleepiness.  However, she is doing pretty well with the medication and she will continue it until September, at which time I would like to try and get her off of it if she is pain free.  For now, continue neurontin 100 mg bid.  -Talked to patient again about fact  that this disease sx's generally re-occur.  Talked to her about pathophysiology.  Pt education provided.   2.  History of leukopenia.  -This has been stable over the course of time and the patient had a hematologic evaluation in 2011 that was unremarkable. 3.  I asked the patient to call me with any questions or concerns before her next visit.

## 2014-06-19 ENCOUNTER — Encounter: Payer: Self-pay | Admitting: Family Medicine

## 2014-06-25 ENCOUNTER — Ambulatory Visit: Payer: 59 | Admitting: Family Medicine

## 2014-06-27 ENCOUNTER — Other Ambulatory Visit: Payer: Self-pay | Admitting: Neurology

## 2014-06-30 ENCOUNTER — Ambulatory Visit (INDEPENDENT_AMBULATORY_CARE_PROVIDER_SITE_OTHER): Payer: 59 | Admitting: Family Medicine

## 2014-06-30 ENCOUNTER — Other Ambulatory Visit: Payer: Self-pay | Admitting: *Deleted

## 2014-06-30 ENCOUNTER — Encounter: Payer: Self-pay | Admitting: Family Medicine

## 2014-06-30 VITALS — BP 132/78 | HR 62 | Temp 98.5°F | Wt 140.5 lb

## 2014-06-30 DIAGNOSIS — G479 Sleep disorder, unspecified: Secondary | ICD-10-CM | POA: Diagnosis not present

## 2014-06-30 NOTE — Patient Instructions (Signed)
Try taking the gabapentin at night to help with sleep and update me as needed.  Take care.  Glad to see you.

## 2014-06-30 NOTE — Progress Notes (Signed)
Pre visit review using our clinic review tool, if applicable. No additional management support is needed unless otherwise documented below in the visit note.  Recently with ear congestion, stuffy.  No fevers.    Pain improved with treatment for trigeminal neuralgia. No ADE on med.   She has been more irritated, less patient recently.  "Less tolerance" overall.  She moved into an apartment and has a difficult neighbor but that neighbor is moving out soon.  Patient states others have noted her change.  "Everythings gets on my nerves and bothers me."  She had to change bedrooms due to her neighbor making noise, but her sleep is still disrupted.  Still working her full time job (with overtime) and her part time job.  No SI/HI.    Meds, vitals, and allergies reviewed.   ROS: See HPI.  Otherwise, noncontributory.  nad ncat Speech and judgement at baseline.   Mmm rrr Affect slightly flat- she looks tired.

## 2014-07-01 DIAGNOSIS — G479 Sleep disorder, unspecified: Secondary | ICD-10-CM | POA: Insufficient documentation

## 2014-07-01 NOTE — Assessment & Plan Note (Signed)
This is likely affecting everything else.  The neighbor is moving out soon.  She can try gabapentin at night to help with sleep, along with moving to the other bedroom and using music to mask noise.  She is still okay for outpatient f/u.  She'll update me as needed.  Her mood isn't to the point of needing to be pulled out of work, though that was discussed as a potential need in the future.  She agrees to update me as needed.

## 2014-07-18 ENCOUNTER — Encounter: Payer: Self-pay | Admitting: Cardiology

## 2014-07-18 NOTE — Progress Notes (Signed)
Patient ID: Karen Saunders, female   DOB: 12/04/1955, 59 y.o.   MRN: [id number]   Karen GoslingBurton, Karen Saunders  Date of visit:  07/18/2014 DOB:  1955-03-26    Age:  58 yrs. Medical record number:  [id number]     Account number:  [id number] Primary Care Provider: Binnie RailUNCAN, G SHAW ____________________________ CURRENT DIAGNOSES  1. CAD Native without angina  2. Hyperlipidemia  3. Hypertensive heart disease without heart failure  4. Presence of aortocoronary bypass graft  5. Personal history of peptic ulcer disease ____________________________ ALLERGIES  Amoxicillin, Intolerance-unknown  Ramipril, Intolerance-cough ____________________________ MEDICATIONS  1. dicyclomine 10 mg capsule, ac  2. Xyzal 5 mg tablet, 1 p.o. daily  3. pantoprazole 40 mg tablet,delayed release (DR/EC), 1 p.o. daily  4. Colace 100 mg capsule, 1 p.o. daily  5. aspirin 81 mg chewable tablet, 1 p.o. daily  6. Dexilant 30 mg capsule, delayed release, 1 p.o. daily  7. amlodipine 5 mg tablet, 1 p.o. daily  8. atorvastatin 80 mg tablet, 1 p.o. daily  9. nitroglycerin 0.4 mg sublingual tablet, Take as directed  10. Metoprolol Tartrate 50 Mg Tablet, BID  11. Zetia 10 Mg Tablet, 1 p.o. daily  12. valsartan 320 mg-hydrochlorothiazide 25 mg tablet, 1 p.o. daily ____________________________ CHIEF COMPLAINTS  Followup of CAD Native without angina  Followup of Hypertensive heart disease without heart failure ____________________________ HISTORY OF PRESENT ILLNESS  Patient seen for cardiac followup. She is doing well at the present time and has had no recurrence of the severe pain like she did in February where she was hospitalized. She is back walking on a regular basis and denies angina. She has no PND, orthopnea or significant edema. ____________________________ PAST HISTORY  Past Medical Illnesses:  hyperlipidemia, hypertension, gastric ulcer 10/05, GERD, migraine headaches, irritable bowel syndrome, cervical disc disease;  Cardiovascular  Illnesses:  CAD;  Surgical Procedures:  breast lumpectomy, hysterectomy May 2002, CABG w/LIMA and SVGs to Diag 1 OM 1 PDA PLR November 2002, Dr. Laneta SimmersBartle;  NYHA Classification:  I;  Canadian Angina Classification:  Class 0: Asymptomatic;  Cardiology Procedures-Invasive:  cardiac cath (left) November 2002, cardiac cath (left) August 2003;  Cardiology Procedures-Noninvasive:  treadmill cardiolite September 2010, echocardiogram May 2014;  Cardiac Cath Results:  normal Left main, occluded LAD, 80% stenosis proximal CFX, occluded PDA, 95% stenosis PLR, widely patent Diag 1 SVG, widely patent PDA PLR SVG, widely patent LAD LIMA graft;  Peripheral Vascular Procedures:  venous dopplers May 2014;  LVEF of 60% documented via echocardiogram on 06/07/2102,   ____________________________ CARDIO-PULMONARY TEST DATES EKG Date:  05/24/2012;   Cardiac Cath Date:  08/24/2001;  CABG: 12/01/2000;  Nuclear Study Date:  02/08/2014;  Echocardiography Date: 06/06/2012;  Chest Xray Date: 06/08/2007;   ____________________________ SOCIAL HISTORY Alcohol Use:  does not use alcohol;  Smoking:  nonsmoker;  Diet:  regular diet;  Lifestyle:  divorced;  Exercise:  exercises regularly;  Occupation:  [employer];  Residence:  lives alone;   ____________________________ REVIEW OF SYSTEMS General:  denies recent weight change, fatique or change in exercise tolerance. Eyes: wears eye glasses/contact lenses Respiratory: denies dyspnea, cough, wheezing or hemoptysis. Cardiovascular:  please review HPI Genitourinary-Female: no dysuria, urgency, frequency, UTIs, or stress incontinence Musculoskeletal:  denies arthritis, venous insufficiency, or muscle weakness Neurological:  denies headaches, stroke, or TIA  ____________________________ PHYSICAL EXAMINATION VITAL SIGNS  Blood Pressure:  140/80 Sitting, Right arm, regular cuff  , 146/78 Standing, Right arm and regular cuff   Pulse:  78/min. Weight:  141.00 lbs.  Height:  66"BMI: 23  Constitutional:  pleasant African American female in no acute distress Skin:  warm and dry to touch, no apparent skin lesions, or masses noted. Head:  normocephalic, normal hair pattern, no masses or tenderness Neck:  supple, no masses, thyromegaly, JVD. Carotid pulses are full and equal bilaterally without bruits. Chest:  clear to auscultation, healed median sternotomy scar Cardiac:  regular rhythm, normal S1 and S2, No S3 or S4, no murmurs, gallops or rubs detected. Peripheral Pulses:  the femoral,dorsalis pedis, and posterior tibial pulses are full and equal bilaterally with no bruits auscultated. Extremities & Back:  well healed saphenous vein donor site RLE, no edema present Neurological:  no gross motor or sensory deficits noted, affect appropriate, oriented x3. ____________________________ MOST RECENT LIPID PANEL 12/17/13  CHOL TOTL 161 mg/dl, LDL 79 NM, HDL 54 mg/dl, TRIGLYCER 161 mg/dl and CHOL/HDL 3.0 (Calc) ____________________________ IMPRESSIONS/PLAN  1. Coronary disease with previous bypass grafting in 2002 with recent negative myocardial perfusion scan 2. Hypertension above goal 3. Hyperlipidemia under treatment currently controlled  Recommendations:  Her symptoms that she had in February are now resolved. Recommended that she continue her treatment and I will see her in followup in 6 months with EKG. Blood pressure slightly above goal advised to be careful with salt consumption monitor blood pressure.  ____________________________ TODAYS ORDERS  1. Return Visit: 6 months  2. 12 Lead EKG: 6 months                       ____________________________ Cardiology Physician:  Darden Palmer MD [facility]

## 2014-07-25 ENCOUNTER — Telehealth: Payer: Self-pay

## 2014-07-25 NOTE — Telephone Encounter (Signed)
Left message on voicemail.

## 2014-07-31 ENCOUNTER — Ambulatory Visit (INDEPENDENT_AMBULATORY_CARE_PROVIDER_SITE_OTHER): Payer: 59 | Admitting: Family Medicine

## 2014-07-31 VITALS — BP 122/72 | HR 66 | Temp 98.0°F | Resp 17 | Ht 63.5 in | Wt 140.0 lb

## 2014-07-31 DIAGNOSIS — M5431 Sciatica, right side: Secondary | ICD-10-CM | POA: Diagnosis not present

## 2014-07-31 DIAGNOSIS — M79604 Pain in right leg: Secondary | ICD-10-CM

## 2014-07-31 MED ORDER — TRAMADOL HCL 50 MG PO TABS: 50.0000 mg | ORAL_TABLET | Freq: Three times a day (TID) | ORAL | Status: DC | PRN

## 2014-07-31 MED ORDER — PREDNISONE 20 MG PO TABS: ORAL_TABLET | ORAL | Status: DC

## 2014-07-31 NOTE — Progress Notes (Signed)
  Subjective:  Patient ID: Karen Saunders, female    DOB: 11-Dec-1955  Age: 59 y.o. MRN: [id number]  59 year old lady who has been having pain in her right knee it started about a month ago. It gradually work which went up until its hurting her in the right buttock and low back area. She hurts worst when she is seated, better when she is laying down. Knows of no specific injury. She works a Office manager. Has not had this problem in the past.  She is on a low-dose of gabapentin for a facial pain, presumably something like trigeminal neuralgia. She has not been able to take much of it for pain because of the drowsiness.  She has stomach medications and I advised that we not give her NSAIDs because of that.   Objective:   No acute distress. No CVA tenderness. No tenderness over the spine. She aches deep in the right buttock but is nontender to touch. Her right knee is nontender. When she was seated straight leg raising causes pain up into her buttock, but when she is laying down I was able to straight leg raising test to 80 without significant pain. Deep tendon reflexes symmetrical.     Assessment & Plan:   Assessment: Consistent with lumbar  Right hip and leg pain  Plan: Patient Instructions  Take prednisone 3 pills daily for 2 days, then 2 daily for 2 days, then 1 daily for 2 days, then one half daily for 4 days. Best taken in the morning, though I would take your first dose today when you get it. If you take it at bedtime it sometimes makes her sleep restless.  Continue using your gabapentin 100 mg 1 at bedtime, and after a few days increase to 2 at bedtime. If possible take 1 in the late afternoon immediately after your first job, and less make she too drowsy in the evening.  Take tramadol 50 mg 1 every 6 or 8 hours as needed for severe pain only  If pains continue to persist over the next 2 or 3 weeks, either return here or go see your primary care physician regarding  this.    HOPPER,DAVID, MD 07/31/2014

## 2014-07-31 NOTE — Patient Instructions (Signed)
Take prednisone 3 pills daily for 2 days, then 2 daily for 2 days, then 1 daily for 2 days, then one half daily for 4 days. Best taken in the morning, though I would take your first dose today when you get it. If you take it at bedtime it sometimes makes her sleep restless.  Continue using your gabapentin 100 mg 1 at bedtime, and after a few days increase to 2 at bedtime. If possible take 1 in the late afternoon immediately after your first job, and less make she too drowsy in the evening.  Take tramadol 50 mg 1 every 6 or 8 hours as needed for severe pain only  If pains continue to persist over the next 2 or 3 weeks, either return here or go see your primary care physician regarding this.

## 2014-08-04 ENCOUNTER — Ambulatory Visit (INDEPENDENT_AMBULATORY_CARE_PROVIDER_SITE_OTHER): Payer: 59 | Admitting: Family Medicine

## 2014-08-04 ENCOUNTER — Encounter: Payer: Self-pay | Admitting: Family Medicine

## 2014-08-04 VITALS — BP 118/74 | HR 64 | Temp 98.6°F | Wt 141.5 lb

## 2014-08-04 DIAGNOSIS — M5431 Sciatica, right side: Secondary | ICD-10-CM | POA: Insufficient documentation

## 2014-08-04 MED ORDER — HYDROCODONE-ACETAMINOPHEN 5-325 MG PO TABS: 1.0000 | ORAL_TABLET | Freq: Three times a day (TID) | ORAL | Status: DC | PRN

## 2014-08-04 NOTE — Progress Notes (Signed)
Pre visit review using our clinic review tool, if applicable. No additional management support is needed unless otherwise documented below in the visit note.  R leg pain.  Started in the lower leg, no with entire leg and R lower back.  Started on pred and tramadol w/o much relief.  No weakness but still in pain.  The leg isn't numb.  No L sided pain.  More pain sitting than standing.  No FCNAVD. Sx going on for about 1 month, gradually worse in the last 2 weeks. No trauma, no trigger.  She couldn't double her gabapentin unless she could stay home and rest since it made her drowsy.  It didn't help anyway.    Meds, vitals, and allergies reviewed.   ROS: See HPI.  Otherwise, noncontributory.  nad ncat Mmm rrr ctab abd soft Back w/o midline pain R SLR positive Able to bear weight, no weakness in BLE but pain with weight bearing S/S wnl BLE

## 2014-08-04 NOTE — Patient Instructions (Signed)
Hydrocodone if needed for pain, sedation caution.  Shirlee Limerick will call about your referral. Take care.  Glad to see you.

## 2014-08-04 NOTE — Assessment & Plan Note (Signed)
Anatomy dw pt.  Likely sciatica.  Use Hydrocodone if needed for pain, sedation caution.  She agrees.  Will get MRI spine given the pain and proceed with neurosurgery referral.  She agrees.

## 2014-08-05 ENCOUNTER — Telehealth: Payer: Self-pay | Admitting: Family Medicine

## 2014-08-05 NOTE — Telephone Encounter (Signed)
Patient Name: Karen Saunders  DOB: 1955-02-05    Initial Comment Caller states she is experiencing leg and hips pain. She is supposed to have MRI Thursday morning, but is in severe pain; wants to know what can be done.    Nurse Assessment  Nurse: Aileen Pilot, RN, April Date/Time Lamount Cohen Time): 08/05/2014 4:56:24 PM  Confirm and document reason for call. If symptomatic, describe symptoms. ---Caller states she is experiencing leg and hips pain. runs up form her leg up to her hips and cant sit well at all. When she stands it is a dull ache and when she stands she states the pain is at 10. She is supposed to have MRI Thursday morning at 7:25, but is in severe pain; wants to know what can be done. Was seen in office yesterday for this. was given a RX for hydrocodone/ Tylenol 5-325 mg and it is not helping much at all. is already on Gabapentin 100 mg from her Neurologist one two times daily.  Has the patient traveled out of the country within the last 30 days? ---No  Does the patient require triage? ---Yes  Related visit to physician within the last 2 weeks? ---Yes  Does the PT have any chronic conditions? (i.e. diabetes, asthma, etc.) ---Yes  List chronic conditions. ---high blood pressure heart disease.     Guidelines    Guideline Title Affirmed Question Affirmed Notes  Leg Pain [1] SEVERE pain (e.g., excruciating, unable to do any normal activities) AND [2] not improved after 2 hours of pain medicine    Final Disposition User   See Physician within 4 Hours (or PCP triage) Aileen Pilot, RN, April    Referrals  REFERRED TO PCP OFFICE   Disagree/Comply: Comply

## 2014-08-06 ENCOUNTER — Telehealth: Payer: Self-pay

## 2014-08-06 NOTE — Telephone Encounter (Signed)
I think that he combination of hydrocodone and gabapentin is the best option for now.  She may need an extra 1/2 tab (ie 1.5 tab) of hydrocodone with her baseline dose of gabapentin.  If she can split the gabapentin, she can try taking an extra 1/2 of that, instead of a full extra pill.  If that can buy her time to get to the MRI, then that would be reasonable.   Thanks.

## 2014-08-06 NOTE — Telephone Encounter (Signed)
Patient notified as instructed by telephone and verbalized understanding. Patient stated that she can not take 1/2 of the Gabapentin because it is a capsule. Patient stated that she will take the Gabapentin and Hydrocodone as instructed and feels like that will help.

## 2014-08-06 NOTE — Telephone Encounter (Signed)
Thanks

## 2014-08-06 NOTE — Telephone Encounter (Signed)
PLEASE NOTE: All timestamps contained within this report are represented as [place] Standard Time. CONFIDENTIALTY NOTICE: This fax transmission is intended only for the addressee. It contains information that is legally privileged, confidential or otherwise protected from use or disclosure. If you are not the intended recipient, you are strictly prohibited from reviewing, disclosing, copying using or disseminating any of this information or taking any action in reliance on or regarding this information. If you have received this fax in error, please notify us immediately by telephone so that we can arrange for its return to [place]. Phone: [phone], Toll-Free: [phone], Fax: [phone] Page: 1 of 2 Call Id: [id number] [facility] Day - Client TELEPHONE ADVICE RECORD [facility] Patient Name: Karen Saunders Gender: Female DOB: 01/12/1955 Age: 59 Y 9 M 17 D Return Phone Number: [phone] (Primary) Address: City/State/Zip: [place] [address] Client [facility] Day - Client Client Site [facility] - Day Physician Raechel Ache Contact Type Call Call Type Triage / Clinical Relationship To Patient Self Appointment Disposition EMR Patient Reports Appointment Already Scheduled Info pasted into Epic Yes Return Phone Number [phone] (Primary) Chief Complaint Leg Pain Initial Comment Caller states she is experiencing leg and hips pain. She is supposed to have MRI Thursday morning, but is in severe pain; wants to know what can be done. PreDisposition Go to Urgent Care/Walk-In Clinic Nurse Assessment Nurse: Aileen Pilot, RN, April Date/Time Lamount Cohen Time): 08/05/2014 4:56:24 PM Confirm and document reason for call. If symptomatic, describe symptoms. ---Caller states she is experiencing leg and hips pain. runs up form her leg up to her hips and cant sit well at all. When she stands it is a dull ache and when  she stands she states the pain is at 10. She is supposed to have MRI Thursday morning at 7:25, but is in severe pain; wants to know what can be done. Was seen in office yesterday for this. was given a RX for hydrocodone/ Tylenol 5-325 mg and it is not helping much at all. is already on Gabapentin 100 mg from her Neurologist one two times daily. Has the patient traveled out of the country within the last 30 days? ---No Does the patient require triage? ---Yes Related visit to physician within the last 2 weeks? ---Yes Does the PT have any chronic conditions? (i.e. diabetes, asthma, etc.) ---Yes List chronic conditions. ---high blood pressure heart disease. Guidelines Guideline Title Affirmed Question Affirmed Notes Nurse Date/Time Lamount Cohen Time) Leg Pain [1] SEVERE pain (e.g., excruciating, unable to do any normal activities) AND [2] not improved after 2 hours of pain medicine Bishop, RN, April 08/05/2014 4:56:42 PM PLEASE NOTE: All timestamps contained within this report are represented as [place] Standard Time. CONFIDENTIALTY NOTICE: This fax transmission is intended only for the addressee. It contains information that is legally privileged, confidential or otherwise protected from use or disclosure. If you are not the intended recipient, you are strictly prohibited from reviewing, disclosing, copying using or disseminating any of this information or taking any action in reliance on or regarding this information. If you have received this fax in error, please notify us immediately by telephone so that we can arrange for its return to [place]. Phone: [phone], Toll-Free: [phone], Fax: [phone] Page: 2 of 2 Call Id: [id number] Disp. Time Lamount Cohen Time) Disposition Final User 08/05/2014 5:20:08 PM Send To RN Personal Bishop, RN, April 08/05/2014 5:24:33 PM Call Completed Annye English, RN, Angelique Blonder 08/05/2014 5:24:58 PM Send To RN Personal  Annye English RNAngelique Blonder 08/05/2014 5:53:24 PM Call  Completed Aileen Pilot, RN, April 08/05/2014 5:11:39 PM See Physician within 4 Hours (or PCP triage) Janace Hoard, RN, April Caller Understands: Yes Disagree/Comply: Comply Care Advice Given Per Guideline SEE PHYSICIAN WITHIN 4 HOURS (or PCP triage): PAIN MEDICINES: * For pain relief, take acetaminophen, ibuprofen, or naproxen. * Use the lowest amount that makes your pain feel better. IBUPROFEN (E.G., MOTRIN, ADVIL): * Take 400 mg (two 200 mg pills) by mouth every 6 hours as needed. CALL BACK IF: * You become worse. CARE ADVICE given per Leg Pain (Adult) guideline. * Do not take nonsteroidal anti-inflammatory drugs (NSAIDs) if you have stomach problems, kidney disease, heart failure, or other contraindications to using this type of medication. CAUTION - NSAIDS (E.G., IBUPROFEN, NAPROXEN): * Do not take NSAID medications for over 7 days without consulting your PCP. * You may take this medicine with or without food. Taking it with food or milk may lessen the chance the drug will upset your stomach. After Care Instructions Given Call Event Type User Date / Time Description Referrals REFERRED TO PCP OFFICE

## 2014-08-06 NOTE — Telephone Encounter (Signed)
Please call pt.  Did the hydrocodone help at all?  When was the last time she used it?  How long dose it last?  Let me know.  Thanks.

## 2014-08-06 NOTE — Telephone Encounter (Signed)
Spoke to patient and was advised that the Hydrocodone eased the pain for about 1 hour when taking it alone. Patient stated that she took the Gabapentin with the Hydrocodone last night and slept really good. Patient stated that if she takes both together she gets more relief. Patient stated that she overslept this morning and was late to work and that is why she does not like to take the Gabapentin and  Hydrocodone together. Patient stated that she did take both this morning and seems to be a little better today than yesterday. Patient stated that she may have taken an extra Gabapentin yesterday because she was in such pain. Pharmacy [facility]

## 2014-08-06 NOTE — Telephone Encounter (Signed)
This team health message has already been sent to PCP thru portal.

## 2014-08-07 ENCOUNTER — Ambulatory Visit
Admission: RE | Admit: 2014-08-07 | Discharge: 2014-08-07 | Disposition: A | Discharge status: Home / Self Care | Payer: 59 | Source: Ambulatory Visit | Attending: Family Medicine | Admitting: Family Medicine

## 2014-08-07 DIAGNOSIS — M5431 Sciatica, right side: Secondary | ICD-10-CM

## 2014-08-12 ENCOUNTER — Encounter: Payer: Self-pay | Admitting: Family Medicine

## 2014-08-18 ENCOUNTER — Telehealth: Payer: Self-pay | Admitting: Family Medicine

## 2014-08-18 ENCOUNTER — Encounter: Payer: Self-pay | Admitting: Family Medicine

## 2014-08-18 NOTE — Telephone Encounter (Signed)
See mychart message.  Please call over to Dr. Sueanne Margarita office on her behalf for his input on her pain medicine.  I absolutely believe that she is in pain.   I sent her a message asking if she thought she could tolerate a higher dose of gabapentin.  Thanks.

## 2014-08-18 NOTE — Telephone Encounter (Signed)
Phoned Dr. Sueanne Margarita office and had to leave a message for one of the CMA's.  I explained the situation briefly and asked for a return call.

## 2014-08-19 NOTE — Telephone Encounter (Signed)
Left detailed message on voicemail to return call. 

## 2014-08-19 NOTE — Telephone Encounter (Signed)
Pt returned your call. She states that the Gabapentin makes her too sleepy to take a higher dose.  She also states that Dr Franky Macho returned her call and gave her a rx for hydrocodone. She is taking that with the gabapentin and it is helping for a couple hours at a time, but the pain is not completely gone.

## 2014-08-20 NOTE — Telephone Encounter (Signed)
App help from Dr. Franky Macho.

## 2014-08-26 ENCOUNTER — Encounter: Payer: Self-pay | Admitting: Family Medicine

## 2014-08-26 ENCOUNTER — Other Ambulatory Visit: Payer: Self-pay | Admitting: Family Medicine

## 2014-08-26 MED ORDER — PREGABALIN 75 MG PO CAPS: 75.0000 mg | ORAL_CAPSULE | Freq: Two times a day (BID) | ORAL | Status: DC

## 2014-08-26 NOTE — Telephone Encounter (Signed)
Left message on patient's voicemail to return call

## 2014-08-26 NOTE — Telephone Encounter (Signed)
Please call pt.  If she can tolerate a higher dose of gabapentin, then okay to increase as tolerated.  If she can't tolerate a higher dose, then we can try lyrica instead and have her stop the gabapentin.  I added the lyrica in the meantime.  Please call in if needed and remove the gabapentin.    Thanks.

## 2014-08-26 NOTE — Telephone Encounter (Signed)
Patient states she will try the Lyrica to see if that provides more relief.  Medication phoned to pharmacy.

## 2014-08-29 ENCOUNTER — Encounter: Payer: Self-pay | Admitting: Family Medicine

## 2014-09-05 ENCOUNTER — Encounter: Payer: Self-pay | Admitting: Neurology

## 2014-09-05 ENCOUNTER — Ambulatory Visit (INDEPENDENT_AMBULATORY_CARE_PROVIDER_SITE_OTHER): Payer: 59 | Admitting: Neurology

## 2014-09-05 VITALS — BP 118/82 | HR 70 | Ht 63.0 in | Wt 147.0 lb

## 2014-09-05 DIAGNOSIS — G500 Trigeminal neuralgia: Secondary | ICD-10-CM

## 2014-09-05 DIAGNOSIS — M5416 Radiculopathy, lumbar region: Secondary | ICD-10-CM

## 2014-09-05 NOTE — Progress Notes (Signed)
Karen Saunders was seen today in neurologic consultation at the request of Crawford Givens, MD.  The consultation is for the evaluation of facial pain and to rule out trigeminal neuralgia.  She previously had an ENT evaluation as well as a dental evaluation, both of which were unrevealing.  The patient reports that she has had this L facial pain for over a year.  The patient reports a gradual onset of the pain and she attributed it to sinuses and she got nose sprays and antibiotics for quite some time without relief.  She then had the ENT eval that was negative.   The pain is intermittent; it generally will wake her up at night.  She may go up to a month or two without the pain.  She desribes it as a stabbing pain.  It involves the entire L check and maybe a "strain" in the eye but no eye pain.  Laying down makes it worse.  Weather changes do not seem to affect the pain, nor does wind or brushing teeth.     06/19/12:  She follows up today.  Last visit, we started her on Trileptal.  This did seem to help.  Not long ago, she tried to wean off of it because she was having lower extremity edema and wondered if it was from the Trileptal.  However, the pain in the face came back when she was off the medicaation.  She did not notice any change in the edema when she was off of the medication, but per her previous notes from other providers, the edema was asymmetric in involving the leg with the prior saphenous vein graft.  She states that the R leg edema is better than it previously was, but seems to get worse as her legs are held antigravity throughout the day.  02/25/14 update:  Karen Saunders is following up today.  She has not been seen since 2014.  The records that were made available to me were reviewed since that time.  At that time, her TN was well controlled and I weaned her off of trileptal.   She did well off of the medication until December, 2015.  It is a pain, throbbing in quality, over the L face.  It is over the L  zygomaticus.  She has been taking motrin, tylenol, "sinus medication", mucinex, goody powders to try and relieve the patient.  The cold wind/weather doesn't make it worse.  She states that it is there all day long but the intensity changes. She was seen by Dr. Jearld Fenton of ENT and she was told that it was not a sinus  05/26/14 update:  The patient is following up today regarding her left trigeminal neuralgia.  Last visit I started her on Trileptal 150 g twice a day and worked her back up to 300 mg twice a day, which she was on previously for quite some time.  She called when she was only at the 150 mg twice a day dosing and stated that she was groggy on the medication and my partner discontinued and started her on gabapentin, 100 mg 3 times a day.  The patient stated that she was still sleepy so she backed down to 100 mg twice a day (generally does it AM and lunch and rarely at bedtime, perhaps only on a Friday when doesn't need to get up for work).  It did help the pain and she only feels the pain twice a month.  It is not as severe as  it was and she is generally very happy.  No other new medical problems.    09/05/14 update:  The patient is following up today.  Her facial pain is well controlled and she is basically pain-free from this regard.  I reviewed records since last visit and her biggest issue is that she is having low back pain/sciatica and is awaiting injections in her low back.  Because of that, she is using a combination of gabapentin and hydrocodone.  She was previously using the gabapentin for her trigeminal neuralgia.  She is not taking 2 tablets of gabapentin, 100 mg, 3 times per day along with hydrocodone, one tablet 3 times per day.  She had an MRI of the lumbar spine on 08/07/2014.  I reviewed this with her and showed her the films.  She has a large disc protrusion at the L3-L4 on the right and L4-L5.    PREVIOUS MEDICATIONS: none  ALLERGIES:   Allergies  Allergen Reactions  .  Amoxicillin-Pot Clavulanate     REACTION: Rash on tongue  . Doxycycline     GI intolerance  . Ibandronate Sodium     REACTION: GI side effects  . Iohexol      Desc: HIVES,NASAL CONGESTION DURING A CARDIAC CATH. REQUIRES PRE-MEDS.   . Raloxifene     REACTION: GI upset  . Sulfonamide Derivatives     REACTION: itching  . Zoledronic Acid     REACTION: unable to take this as she was intolerant of pre-tx calcium and vitamin D    CURRENT MEDICATIONS:  Current Outpatient Prescriptions on File Prior to Visit  Medication Sig Dispense Refill  . amLODipine (NORVASC) 5 MG tablet Take 5 mg by mouth daily.    Marland Kitchen aspirin 81 MG tablet Take 81 mg by mouth daily.      Marland Kitchen atorvastatin (LIPITOR) 80 MG tablet Take 80 mg by mouth daily.    Marland Kitchen dexlansoprazole (DEXILANT) 60 MG capsule Take 60 mg by mouth daily.    Marland Kitchen dicyclomine (BENTYL) 10 MG capsule TAKE 1 CAPSULE (10 MG TOTAL) BY MOUTH 4 (FOUR) TIMES DAILY - BEFORE MEALS AND AT BEDTIME. (Patient taking differently: Take 10 mg by mouth 4 (four) times daily -  before meals and at bedtime. ) 120 capsule 5  . ezetimibe (ZETIA) 10 MG tablet Take 10 mg by mouth daily.      Marland Kitchen HYDROcodone-acetaminophen (NORCO/VICODIN) 5-325 MG per tablet Take 1-2 tablets by mouth 3 (three) times daily as needed for moderate pain. 30 tablet 0  . levocetirizine (XYZAL) 5 MG tablet TAKE 1 TABLET BY MOUTH EVERY DAY 30 tablet 5  . metoprolol (LOPRESSOR) 50 MG tablet Take 50 mg by mouth 2 (two) times daily.      . pantoprazole (PROTONIX) 40 MG tablet AKE 1 TABLET BY MOUTH 2 TIMES A DAY 30 MINUTES PRIOR TO A MEAL 180 tablet 2  . valsartan-hydrochlorothiazide (DIOVAN-HCT) 320-25 MG per tablet Take 1 tablet by mouth daily.     No current facility-administered medications on file prior to visit.    PAST MEDICAL HISTORY:   Past Medical History  Diagnosis Date  . Hypertension   . Hyperlipidemia   . Osteoporosis 11/18/2004  . Migraine     Unsp w/o intract w/o status migrainosus  . IBS  (irritable bowel syndrome)   . CAD (coronary artery disease)     Dr. Donnie Aho with Cards  . Leukopenia     with prev unremarkable heme eval ~2011  . Anemia  with prev unremarkable heme eval ~2011  . Shingles   . Trigeminal neuralgia 03/05/2012  . History of gastric ulcer     2005     PAST SURGICAL HISTORY:   Past Surgical History  Procedure Laterality Date  . Stress cardiolite  09/11/2008    Probably normal with breast attenuation noted but no evidence of ischemia  . Abdominal hysterectomy  2002  . Tonsillectomy  ~ 1965  . Coronary artery bypass graft  2002    SOCIAL HISTORY:   Social History   Social History  . Marital Status: Divorced    Spouse Name: N/A  . Number of Children: 1  . Years of Education: N/A   Occupational History  . [employer]  . 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 Smoking status: Never Smoker   . Smokeless tobacco: Never Used  . Alcohol Use: No  . Drug Use: No  . Sexual Activity: Not on file   Other Topics Concern  . Not on file   Social History Narrative   From [place].   Education:  14 years   Regular Exercise:  Yes   Enjoys time at church and jazz music    FAMILY HISTORY:   Family Status  Relation Status Death Age  . Mother Alive     DJD  . Father Deceased 59    MI, CVA  . Sister Alive     2, healthy  . Brother Alive     healthy  . Child Alive     1, alive and well    ROS:  A complete 10 system review of systems was obtained and was unremarkable apart from what is mentioned above.  PHYSICAL EXAMINATION:    VITALS:   Filed Vitals:   09/05/14 1003  BP: 118/82  Pulse: 70  Height: 5\' 3"  (1.6 m)  Weight: 147 lb (66.679 kg)   Wt Readings from Last 3 Encounters:  09/05/14 147 lb (66.679 kg)  08/04/14 141 lb 8 oz (64.184 kg)  07/31/14 140 lb (63.504 kg)    GEN:  Normal appears female in no acute distress.  Appears stated age. HEENT:  Normocephalic,  atraumatic. The mucous membranes are moist. The superficial temporal arteries are without ropiness or tenderness. Cardiovascular: Regular rate and rhythm. Lungs: Clear to auscultation bilaterally. Neck/Heme: There are no carotid bruits noted bilaterally. Exts:  There is trace edema in the R leg only  NEUROLOGICAL: Orientation:  The patient is alert and oriented x 3.  Fund of knowledge is appropriate.  Recent and remote memory intact.  Attention span and concentration normal.  Repeats and names without difficulty. Cranial nerves: There is good facial symmetry.Speech is fluent and clear. Soft palate rises symmetrically and there is no tongue deviation. Hearing is intact to conversational tone. Tone: Tone is good throughout. Sensation: Sensation is intact to light touch.  There is no extinction with double simultaneous stimulation. There is no sensory dermatomal level identified. Coordination:  The patient has no difficulty with RAM's or FNF bilaterally. Motor: Strength is 5/5 in the bilateral upper and lower extremities.  Shoulder shrug is equal and symmetric. There is no pronator drift.  There are no fasciculations noted. Gait and Station: The patient has an antalgic gait today Deep tendon reflexes: 2/4 at the bilateral biceps, triceps, brachioradialis, left patella, 1/4 at the right patella.  LABS:  Lab Results  Component Value Date   WBC 2.4*  02/06/2014   HGB 11.4* 02/06/2014   HCT 33.2* 02/06/2014   MCV 87.4 02/06/2014   PLT 154 02/06/2014   Lab Results  Component Value Date   TSH 2.03 09/06/2011     Chemistry      Component Value Date/Time   NA 140 03/19/2014 1008   K 4.0 03/19/2014 1008   CL 104 03/19/2014 1008   CO2 22 03/19/2014 1008   BUN 14 03/19/2014 1008   CREATININE 0.96 03/19/2014 1008   CREATININE 1.06 02/06/2014 2223      Component Value Date/Time   CALCIUM 9.3 03/19/2014 1008   ALKPHOS 99 04/06/2012 1508   AST 40* 04/06/2012 1508   ALT 34 04/06/2012 1508    BILITOT 0.3 04/06/2012 1508         IMPRESSION/PLAN:  1. Left trigeminal neuralgia.  -She has presented with this both in February, 2014 and Feb 2016.    -Not completely convinced that the sleepiness was from the Trileptal, since she tolerated this medication very well in 2014.  In addition, when we changed the medication to gabapentin, she still had the same sleepiness.  She is now pain-free from trigeminal neuralgia and I was going to stop her gabapentin, but she is now using it for back pain.  -Talked to patient again about fact that this disease sx's generally re-occur.  Talked to her about pathophysiology.  Pt education provided.   2.  History of leukopenia.  -This has been stable over the course of time and the patient had a hematologic evaluation in 2011 that was unremarkable. 3.  Lumbar radiculopathy  -She is planning on having an injection soon.  I hope this works, but if it does not, I did encourage her to follow through with potential surgery.  She has already seen neurosurgery, Dr. Mikal Plane.  She already has a marked decreased L4 reflex on the right compared to that of the left. 4.  She will let me know when she needs a follow-up visit if and when the the trigeminal neuralgia recurs.  Greater than 50% of this 25 minute visit was spent in counseling, primarily discussing lumbar radiculopathy.

## 2014-09-07 ENCOUNTER — Other Ambulatory Visit: Payer: Self-pay | Admitting: Neurology

## 2014-09-08 NOTE — Telephone Encounter (Signed)
I was actually going to d/c it for her trigeminal neuralgia and I think that another physician has changed the dose for her back pain so I will let that physician take over writing of it.  That being said, please call her and let her know because I don't want her to be out of it as I know her pain was getting bad.

## 2014-09-08 NOTE — Telephone Encounter (Signed)
LMOM making patient aware that Gabapentin denied. She needs to have MD who changed dosage and who is treating back pain refill medication. She is to call with any questions.

## 2014-09-08 NOTE — Telephone Encounter (Signed)
Since she is using this for back pain, do you refill it?

## 2014-09-09 ENCOUNTER — Other Ambulatory Visit: Payer: Self-pay

## 2014-09-09 MED ORDER — HYDROCODONE-ACETAMINOPHEN 5-325 MG PO TABS: 1.0000 | ORAL_TABLET | Freq: Three times a day (TID) | ORAL | Status: DC | PRN

## 2014-09-09 MED ORDER — GABAPENTIN 100 MG PO CAPS: 100.0000 mg | ORAL_CAPSULE | Freq: Three times a day (TID) | ORAL | Status: DC

## 2014-09-09 NOTE — Telephone Encounter (Signed)
Pt left v/m; Dr Arbutus Leas has stopped gabapentin for trigeminal neuralgia; pt was taking the gabapentin for sciatica also per Dr Para March. Pt request Dr Para March to prescribe the gabapentin to [facility] [address] Pt also request rx for hydrocodone apap. Call when ready for pick up. Pt last seen and rx printed # 30 on 08/04/14. Pt scheduled for injection on 09/16/2014.Pt request cb.

## 2014-09-09 NOTE — Telephone Encounter (Signed)
Patient advised.  Rx left at front desk for pick up. 

## 2014-09-09 NOTE — Telephone Encounter (Signed)
Gabapentin sent, hydrocodone printed.  Thanks.

## 2014-09-11 ENCOUNTER — Encounter: Payer: Self-pay | Admitting: Family Medicine

## 2014-09-16 ENCOUNTER — Ambulatory Visit: Payer: 59 | Admitting: Neurology

## 2014-09-17 ENCOUNTER — Ambulatory Visit: Payer: 59 | Admitting: Neurology

## 2014-09-18 ENCOUNTER — Encounter: Payer: Self-pay | Admitting: Podiatry

## 2014-09-18 ENCOUNTER — Ambulatory Visit (INDEPENDENT_AMBULATORY_CARE_PROVIDER_SITE_OTHER): Payer: 59 | Admitting: Podiatry

## 2014-09-18 ENCOUNTER — Ambulatory Visit (INDEPENDENT_AMBULATORY_CARE_PROVIDER_SITE_OTHER): Payer: 59

## 2014-09-18 VITALS — BP 132/80 | HR 71 | Resp 16

## 2014-09-18 DIAGNOSIS — M79672 Pain in left foot: Secondary | ICD-10-CM

## 2014-09-18 DIAGNOSIS — M779 Enthesopathy, unspecified: Secondary | ICD-10-CM | POA: Diagnosis not present

## 2014-09-18 MED ORDER — TRIAMCINOLONE ACETONIDE 10 MG/ML IJ SUSP
10.0000 mg | Freq: Once | INTRAMUSCULAR | Status: AC
  Administered 2014-09-18: 10 mg

## 2014-09-19 NOTE — Progress Notes (Signed)
Subjective:     Patient ID: Karen Saunders, female   DOB: [birth year], 59 y.o.   MRN: [id number]  HPI patient presents stating I slipped and I developed discomfort on top of my left foot that's making it hard for me to sleep. This occurred approximately 1 week ago   Review of Systems     Objective:   Physical Exam Neurovascular status intact with muscle strength adequate range of motion within normal limits with patient noted to have discomfort with inflammation of the extensor tendon complex left with fluid buildup. I checked muscle strength found to be adequate and did not note any tendon tear and I also found digital perfusion to be adequate with no indication of equinus    Assessment:     Dorsal tendinitis with traumatized foot type left and probable foot sprain    Plan:     H&P and x-rays reviewed with patient. At this time I went ahead and did dorsal injection of the extensor complex 3 mg Kenalog 5 mill grams Xylocaine and advised on heat and ice therapy and instructed on wider-type shoe gear. Patient will be seen back to recheck

## 2014-09-22 ENCOUNTER — Encounter: Payer: Self-pay | Admitting: Family Medicine

## 2014-09-22 ENCOUNTER — Telehealth: Payer: Self-pay | Admitting: *Deleted

## 2014-09-22 NOTE — Telephone Encounter (Signed)
Dr Para March pt please advise

## 2014-09-22 NOTE — Telephone Encounter (Addendum)
Pt states she was seen 1 week ago after twisting her foot, she was given a injection, and the swelling has come down but she continues to have pain so severe she can only wear flip-flops.  Pt states she been using ice, then heat on and off as directed, what next for pain control.  Pt was scheduled for follow up on 09/24/2014.

## 2014-09-23 NOTE — Telephone Encounter (Signed)
Pt advise me that the cough is a new issue and no OTC meds have helped, I advise pt she needs to be seen to eval diarrhea and cough. Pt declined to schedule appt today or tomorrow due to another appt., and work. Pt schedule appt with Dr. Para March on Thursday 09/25/14, I advise pt if sxs worsen or she develops any new sxs to call us back and if after hours to go to ER or UC, pt verbalized understanding

## 2014-09-24 ENCOUNTER — Telehealth: Payer: Self-pay | Admitting: *Deleted

## 2014-09-24 ENCOUNTER — Ambulatory Visit (INDEPENDENT_AMBULATORY_CARE_PROVIDER_SITE_OTHER): Payer: 59 | Admitting: Podiatry

## 2014-09-24 ENCOUNTER — Ambulatory Visit: Payer: 59

## 2014-09-24 VITALS — BP 118/75 | HR 79 | Resp 16

## 2014-09-24 DIAGNOSIS — M779 Enthesopathy, unspecified: Secondary | ICD-10-CM

## 2014-09-24 DIAGNOSIS — M79672 Pain in left foot: Secondary | ICD-10-CM

## 2014-09-24 DIAGNOSIS — S92301A Fracture of unspecified metatarsal bone(s), right foot, initial encounter for closed fracture: Secondary | ICD-10-CM

## 2014-09-24 MED ORDER — HYDROCODONE-ACETAMINOPHEN 5-325 MG PO TABS: 1.0000 | ORAL_TABLET | Freq: Four times a day (QID) | ORAL | Status: DC | PRN

## 2014-09-24 NOTE — Telephone Encounter (Signed)
Pt [facility] [address], so it would be easier to pick up when she went home in the boot.  Informed pt I would change the destination of the rx, but the Hydrocodone is a hand carry script.  Pt states she will come by to pick up in the [place] office this evening.

## 2014-09-24 NOTE — Patient Instructions (Signed)

## 2014-09-24 NOTE — Progress Notes (Signed)
Subjective:     Patient ID: Karen Saunders, female   DOB: January 03, 1956, 59 y.o.   MRN: [id number]  HPI patient states I'm still having a lot of pain on top of my left foot and even though the swelling has gone down it is very sore   Review of Systems     Objective:   Physical Exam Neurovascular status intact muscle strength was adequate with significant diminishment of edema dorsum left foot on the midtarsal joint. The pain is more distal at this time and I noted especially in the second metatarsal proximal shaft is quite tender when pressed    Assessment:     Cannot rule out that there may not be a subtle fracture in this area now that edema has reduced versus a tendinitis or inflammatory condition    Plan:     Reviewed x-ray indicating possibility for a very subtle fracture the base of the second metatarsal and as a result I placed patient into a short air fracture walker to reduce all pressure on the foot and reappoint in 2 weeks for re-x-ray instructed on ice

## 2014-09-25 ENCOUNTER — Ambulatory Visit: Payer: 59 | Admitting: Family Medicine

## 2014-09-30 ENCOUNTER — Other Ambulatory Visit: Payer: Self-pay | Admitting: Family Medicine

## 2014-09-30 LAB — BASIC METABOLIC PANEL
BUN: 20 mg/dL (ref 4–21)
Creatinine: 1.2 mg/dL — AB (ref <=1.1)
Glucose: 100 mg/dL

## 2014-09-30 LAB — HEPATIC FUNCTION PANEL
ALT: 24 U/L (ref 7–35)
AST: 21 U/L (ref 13–35)
Alkaline Phosphatase: 75 U/L (ref 25–125)

## 2014-09-30 LAB — TSH: TSH: 1.89 u[IU]/mL (ref <=5.90)

## 2014-09-30 LAB — CBC AND DIFFERENTIAL
Hemoglobin: 11 g/dL — AB (ref 12.0–16.0)
WBC: 2.9 10^3/mL

## 2014-10-01 NOTE — Telephone Encounter (Signed)
Ok to refill? Last filled 11/27/13 and last OV 08/04/14.

## 2014-10-01 NOTE — Telephone Encounter (Signed)
Sent. Thanks.   

## 2014-10-03 ENCOUNTER — Other Ambulatory Visit: Payer: Self-pay | Admitting: Family Medicine

## 2014-10-03 ENCOUNTER — Telehealth: Payer: Self-pay

## 2014-10-03 DIAGNOSIS — N289 Disorder of kidney and ureter, unspecified: Secondary | ICD-10-CM

## 2014-10-03 NOTE — Telephone Encounter (Signed)
It appears this has been handle

## 2014-10-03 NOTE — Telephone Encounter (Signed)
plz notify kidney function a bit bumped. Kidney function has been better over last few years when we've checked here.  Recommend stay well hydrated with plenty of water and avoid any anti inflammatories like ibuprofen aleve or advil. Tylenol is ok. Make sure bp staying around range of 110-130/60-80 for optimal control rec recheck lab in 1-2 wks. Labs ordered. If staying elevated schedule office visit with PCP. Labs placed in Dr Lianne Bushy box.

## 2014-10-03 NOTE — Telephone Encounter (Signed)
Pt left v/m; pt got call 10/02/14 in the evening from Dr Loreta Ave; Dr Loreta Ave concerned about kidney blood test; Dr Loreta Ave has forwarded lab results to Dr Para March and pt request cb today with what the problem is with her kidneys. Dr Para March out of office today and note sent to Dr Reece Agar. Lab results from [facility] in Dr [facility]oteo Expose in box.

## 2014-10-03 NOTE — Telephone Encounter (Signed)
Pt notified as instructed and voiced understanding. Pt will cb to schedule lab appt. Pt will ck BP and if not in optimal control pt will call [facility]. To Dr Para March as Lorain Childes.

## 2014-10-06 NOTE — Telephone Encounter (Signed)
Thanks

## 2014-10-07 ENCOUNTER — Encounter: Payer: Self-pay | Admitting: Family Medicine

## 2014-10-07 NOTE — Telephone Encounter (Signed)
Labs noted.   Chronic neutropenia and anemia noted.  No intervention needed.  Minimal inc in Cr.  Agreed with recheck labs.  Thanks.

## 2014-10-08 ENCOUNTER — Ambulatory Visit (INDEPENDENT_AMBULATORY_CARE_PROVIDER_SITE_OTHER): Payer: 59 | Admitting: Podiatry

## 2014-10-08 ENCOUNTER — Encounter: Payer: Self-pay | Admitting: Podiatry

## 2014-10-08 ENCOUNTER — Ambulatory Visit (INDEPENDENT_AMBULATORY_CARE_PROVIDER_SITE_OTHER): Payer: 59

## 2014-10-08 VITALS — BP 119/76 | HR 61 | Resp 16

## 2014-10-08 DIAGNOSIS — M79672 Pain in left foot: Secondary | ICD-10-CM | POA: Diagnosis not present

## 2014-10-08 DIAGNOSIS — S92301A Fracture of unspecified metatarsal bone(s), right foot, initial encounter for closed fracture: Secondary | ICD-10-CM | POA: Diagnosis not present

## 2014-10-08 NOTE — Progress Notes (Signed)
Subjective:     Patient ID: Karen Saunders, female   DOB: May 09, 1955, 59 y.o.   MRN: [id number]  HPI patient presents stating the left foot feels better but I'm still wearing the boot. Still having some pain   Review of Systems     Objective:   Physical Exam Neurovascular status intact negative Homans sign noted with pain in the second metatarsal proximal shaft that's improved but still present    Assessment:     Fracture of the second metatarsal base which is healing but still symptomatic    Plan:     X-ray reviewed and advised on ice therapy elevation and compression. Continue boot usage and gradually reduce over the next 4 weeks and reappoint at that time

## 2014-10-10 ENCOUNTER — Other Ambulatory Visit (INDEPENDENT_AMBULATORY_CARE_PROVIDER_SITE_OTHER): Payer: 59

## 2014-10-10 DIAGNOSIS — N289 Disorder of kidney and ureter, unspecified: Secondary | ICD-10-CM

## 2014-10-10 LAB — RENAL FUNCTION PANEL
Albumin: 4.3 g/dL (ref 3.5–5.2)
BUN: 22 mg/dL (ref 6–23)
CO2: 30 mEq/L (ref 19–32)
Calcium: 9.6 mg/dL (ref 8.4–10.5)
Chloride: 104 mEq/L (ref 96–112)
Creatinine, Ser: 1.22 mg/dL — ABNORMAL HIGH (ref 0.40–1.20)
GFR: 57.63 mL/min — ABNORMAL LOW (ref >60.00)
Glucose, Bld: 84 mg/dL (ref 70–99)
Phosphorus: 3.3 mg/dL (ref 2.3–4.6)
Potassium: 3.9 mEq/L (ref 3.5–5.1)
Sodium: 141 mEq/L (ref 135–145)

## 2014-10-17 ENCOUNTER — Other Ambulatory Visit: Payer: Self-pay | Admitting: Podiatry

## 2014-10-17 MED ORDER — HYDROCODONE-ACETAMINOPHEN 5-325 MG PO TABS: 1.0000 | ORAL_TABLET | Freq: Four times a day (QID) | ORAL | Status: DC | PRN

## 2014-11-05 ENCOUNTER — Encounter: Payer: Self-pay | Admitting: Podiatry

## 2014-11-05 ENCOUNTER — Ambulatory Visit (INDEPENDENT_AMBULATORY_CARE_PROVIDER_SITE_OTHER): Payer: 59

## 2014-11-05 ENCOUNTER — Ambulatory Visit (INDEPENDENT_AMBULATORY_CARE_PROVIDER_SITE_OTHER): Payer: 59 | Admitting: Podiatry

## 2014-11-05 VITALS — BP 123/76 | HR 75 | Resp 16

## 2014-11-05 DIAGNOSIS — S92302D Fracture of unspecified metatarsal bone(s), left foot, subsequent encounter for fracture with routine healing: Secondary | ICD-10-CM

## 2014-11-05 NOTE — Progress Notes (Signed)
Subjective:     Patient ID: Karen Saunders, female   DOB: 04/11/1955, 59 y.o.   MRN: [id number]  HPI patient states my left foot is still hurting but it is gradually improving as time goes on   Review of Systems     Objective:   Physical Exam Neurovascular status intact with moderate discomfort upon deep palpation to the left second metatarsal with no other adjacent pains currently    Assessment:     Fracture of the base of the second metatarsal left that's doing well and gradually healing    Plan:     Reviewed final x-rays and advised on gradual return to normal activities. Reappoint for us to recheck

## 2014-11-12 ENCOUNTER — Other Ambulatory Visit: Payer: Self-pay | Admitting: Family Medicine

## 2014-11-12 NOTE — Telephone Encounter (Signed)
Sent. Thanks.   

## 2014-11-12 NOTE — Telephone Encounter (Signed)
I received a refill request for Gabapentin. Last refilled 09/09/14 for #90 with 1 refill. Last office visit was 08/04/14. Ok to refill this medication?

## 2015-01-14 ENCOUNTER — Telehealth: Payer: Self-pay

## 2015-01-14 MED ORDER — BENZONATATE 200 MG PO CAPS: 200.0000 mg | ORAL_CAPSULE | Freq: Three times a day (TID) | ORAL | Status: DC | PRN

## 2015-01-14 NOTE — Telephone Encounter (Signed)
Pt left v/m requesting med for dry cough sent to [facility] [address]. Pt has not been seen recently; unable to reach pt by phone after repeated attempts.

## 2015-01-14 NOTE — Telephone Encounter (Signed)
Patient notified as instructed by telephone and verbalized understanding. 

## 2015-01-14 NOTE — Telephone Encounter (Signed)
I sent rx for tessalon.  F/u or call back prn.  Thanks.

## 2015-01-16 ENCOUNTER — Encounter: Payer: Self-pay | Admitting: Cardiology

## 2015-01-16 DIAGNOSIS — M519 Unspecified thoracic, thoracolumbar and lumbosacral intervertebral disc disorder: Secondary | ICD-10-CM | POA: Insufficient documentation

## 2015-01-16 NOTE — Progress Notes (Signed)
Patient ID: Karen Saunders, female   DOB: 11/30/1955, 60 y.o.   MRN: [id number]   Karen Saunders, Karen Saunders  Date of visit:  01/16/2015 DOB:  Feb 13, 1955    Age:  59 yrs. Medical record number:  [id number]     Account number:  [id number] Primary Care Provider: Binnie RailUNCAN, G SHAW ____________________________ CURRENT DIAGNOSES  1. CAD Native without angina  2. Hyperlipidemia  3. Hypertensive heart disease without heart failure  4. Presence of aortocoronary bypass graft  5. Personal history of peptic ulcer disease ____________________________ ALLERGIES  Amoxicillin, Intolerance-unknown  Ramipril, Intolerance-cough ____________________________ MEDICATIONS  1. dicyclomine 10 mg capsule, ac  2. Xyzal 5 mg tablet, 1 p.o. daily  3. pantoprazole 40 mg tablet,delayed release (DR/EC), 1 p.o. daily  4. Colace 100 mg capsule, 1 p.o. daily  5. aspirin 81 mg chewable tablet, 1 p.o. daily  6. Dexilant 30 mg capsule, delayed release, 1 p.o. daily  7. amlodipine 5 mg tablet, 1 p.o. daily  8. atorvastatin 80 mg tablet, 1 p.o. daily  9. nitroglycerin 0.4 mg sublingual tablet, Take as directed  10. Metoprolol Tartrate 50 Mg Tablet, BID  11. Zetia 10 Mg Tablet, 1 p.o. daily  12. valsartan 320 mg-hydrochlorothiazide 25 mg tablet, 1 p.o. daily  13. gabapentin 100 mg capsule, TID  14. hydrocodone 5 mg-acetaminophen 325 mg tablet, PRN ____________________________ CHIEF COMPLAINTS  Followup of CAD Native without angina ____________________________ HISTORY OF PRESENT ILLNESS  Patient seen for cardiac followup. She has been doing well since she was previously here. She denies angina and has no PND, orthopnea, syncope, palpitations, or claudication. Her lipids were reviewed today and are under good control except for slight elevation of her triglycerides. She has been having a great deal of difficulty with sciatica since she was here and has been receiving epidural steroid injections without significant relief. She is currently  in the process of seeing another doctor about her back. ____________________________ PAST HISTORY  Past Medical Illnesses:  hyperlipidemia, hypertension, gastric ulcer 10/05, GERD, migraine headaches, irritable bowel syndrome, cervical disc disease, lumbar disc disease;  Cardiovascular Illnesses:  CAD;  Surgical Procedures:  breast lumpectomy, hysterectomy May 2002, CABG w/LIMA and SVGs to Diag 1 OM 1 PDA PLR November 2002, Dr. Laneta SimmersBartle;  NYHA Classification:  I;  Canadian Angina Classification:  Class 0: Asymptomatic;  Cardiology Procedures-Invasive:  cardiac cath (left) November 2002, cardiac cath (left) August 2003;  Cardiology Procedures-Noninvasive:  treadmill cardiolite September 2010, echocardiogram May 2014;  Cardiac Cath Results:  normal Left main, occluded LAD, 80% stenosis proximal CFX, occluded PDA, 95% stenosis PLR, widely patent Diag 1 SVG, widely patent PDA PLR SVG, widely patent LAD LIMA graft;  Peripheral Vascular Procedures:  venous dopplers May 2014;  LVEF of 60% documented via echocardiogram on 06/07/2102,   ____________________________ CARDIO-PULMONARY TEST DATES EKG Date:  01/16/2015;   Cardiac Cath Date:  08/24/2001;  CABG: 12/01/2000;  Nuclear Study Date:  02/08/2014;  Echocardiography Date: 06/06/2012;  Chest Xray Date: 06/08/2007;   ____________________________ PHYSICAL EXAMINATION VITAL SIGNS  Blood Pressure:  110/70 Sitting, Right arm, regular cuff  , 106/68 Standing, Right arm and regular cuff   Pulse:  64/min. Weight:  139.00 lbs. Height:  66"BMI: 22  Constitutional:  pleasant African American female in no acute distress Skin:  warm and dry to touch, no apparent skin lesions, or masses noted. Head:  normocephalic, normal hair pattern, no masses or tenderness Neck:  supple, no masses, thyromegaly, JVD. Carotid pulses are full and equal bilaterally without bruits. Chest:  clear to auscultation, healed median sternotomy scar Cardiac:  regular rhythm, normal S1 and S2, No S3  or S4, no murmurs, gallops or rubs detected. Peripheral Pulses:  the femoral,dorsalis pedis, and posterior tibial pulses are full and equal bilaterally with no bruits auscultated. Extremities & Back:  well healed saphenous vein donor site RLE, no edema present Neurological:  no gross motor or sensory deficits noted, affect appropriate, oriented x3. ____________________________ MOST RECENT LIPID PANEL 01/16/15  CHOL TOTL 135 mg/dl, LDL 47 NM, HDL 42 mg/dl, TRIGLYCER 161 mg/dl and CHOL/HDL 3.2 (Calc) ____________________________ IMPRESSIONS/PLAN  1. Coronary artery disease with previous bypass grafting with no angina 2. Hyperlipidemia with LDL under excellent control 3. Hypertension controlled 4. Significant sciatica  Recommendations:  EKG shows LVH with ST and T-wave changes. Her major issue is dealing with the amount of pain she has in her leg. Lipids are well controlled. I will see her in followup in one year and she is to call if she has recurrent problems. ____________________________ TODAYS ORDERS  1. 12 Lead EKG: Today  2. Return Visit: 1 year                       ____________________________ Cardiology Physician:  Darden Palmer MD [facility]

## 2015-01-23 ENCOUNTER — Encounter: Payer: Self-pay | Admitting: Family Medicine

## 2015-01-23 ENCOUNTER — Ambulatory Visit (INDEPENDENT_AMBULATORY_CARE_PROVIDER_SITE_OTHER): Payer: 59 | Admitting: Family Medicine

## 2015-01-23 VITALS — BP 162/98 | HR 65 | Temp 98.0°F | Wt 136.2 lb

## 2015-01-23 DIAGNOSIS — J209 Acute bronchitis, unspecified: Secondary | ICD-10-CM | POA: Insufficient documentation

## 2015-01-23 DIAGNOSIS — L80 Vitiligo: Secondary | ICD-10-CM

## 2015-01-23 DIAGNOSIS — I119 Hypertensive heart disease without heart failure: Secondary | ICD-10-CM | POA: Diagnosis not present

## 2015-01-23 MED ORDER — AZITHROMYCIN 250 MG PO TABS: ORAL_TABLET | ORAL | Status: DC

## 2015-01-23 MED ORDER — HYDROCODONE-HOMATROPINE 5-1.5 MG/5ML PO SYRP: 5.0000 mL | ORAL_SOLUTION | Freq: Three times a day (TID) | ORAL | Status: DC | PRN

## 2015-01-23 NOTE — Assessment & Plan Note (Signed)
bp is up today Possibly due to otc pseudoephedrine-adv to stop any product with "D" in it

## 2015-01-23 NOTE — Patient Instructions (Addendum)
I think you have bronchitis with a cough syndrome Drink lots of fluids  Continue tessalon pills for cough  Use the hycodan when not working or driving (with caution) Take zpak as directed  Rest this weekend  Update if not starting to improve in a week or if worsening     Let's refer you to dermatology for skin pigmentation change

## 2015-01-23 NOTE — Progress Notes (Signed)
Subjective:    Patient ID: Karen Saunders, female    DOB: 1955-07-14, 60 y.o.   MRN: [id number]  HPI  Here for uri symptoms   Over a week Started to get better and then worse again  At 3 am today- very scratchy throat/ bad cough that would not help  Tried hot tea and gargling   Took nyquil Took mucinex   No fever  Cough - is prod of yellow phlegm  No wheeze  No smoking or hx of lung dx  She works at [employer] - a lot of exposures   Also L foot discoloration - pale area on front of foot- coming on leg  No fam hx of vitiligo    Patient Active Problem List   Diagnosis Date Noted  . Lumbar disc disease 01/16/2015  . Right sided sciatica 08/04/2014  . Sleep disorder 07/01/2014  . History of gastric ulcer   . Cervical disc disease   . Anemia 11/10/2010  . Osteoporosis   . Migraine headaches   . Hypertensive heart disease   . Coronary atherosclerosis 01/12/2010  . IBS 01/12/2010  . Hyperlipdemia    Past Medical History  Diagnosis Date  . Hypertension   . Hyperlipidemia   . Osteoporosis 11/18/2004  . Migraine     Unsp w/o intract w/o status migrainosus  . IBS (irritable bowel syndrome)   . CAD (coronary artery disease)     Dr. Donnie Aho with Cards  . Leukopenia     with prev unremarkable heme eval ~2011  . Anemia     with prev unremarkable heme eval ~2011  . Shingles   . Trigeminal neuralgia 03/05/2012  . History of gastric ulcer     2005    Past Surgical History  Procedure Laterality Date  . Stress cardiolite  09/11/2008    Probably normal with breast attenuation noted but no evidence of ischemia  . Abdominal hysterectomy  2002  . Tonsillectomy  ~ 1965  . Coronary artery bypass graft  2002   Social History  Substance Use Topics  . Smoking status: Never Smoker   . Smokeless tobacco: Never Used  . Alcohol Use: No   Family History  Problem Relation Age of Onset  . Arthritis Mother   . Diabetes Mother   . Hypertension Mother   . Stroke Father   . Heart disease  Father     MI  . Diabetes Father   . Hypertension Father    Allergies  Allergen Reactions  . Amoxicillin-Pot Clavulanate     REACTION: Rash on tongue  . Doxycycline     GI intolerance  . Ibandronate Sodium     REACTION: GI side effects  . Iohexol      Desc: HIVES,NASAL CONGESTION DURING A CARDIAC CATH. REQUIRES PRE-MEDS.   . Raloxifene     REACTION: GI upset  . Sulfonamide Derivatives     REACTION: itching  . Zoledronic Acid     REACTION: unable to take this as she was intolerant of pre-tx calcium and vitamin D   Current Outpatient Prescriptions on File Prior to Visit  Medication Sig Dispense Refill  . amLODipine (NORVASC) 5 MG tablet Take 5 mg by mouth daily.    Marland Kitchen aspirin 81 MG tablet Take 81 mg by mouth daily.      Marland Kitchen atorvastatin (LIPITOR) 80 MG tablet Take 80 mg by mouth daily.    . benzonatate (TESSALON) 200 MG capsule Take 1 capsule (200 mg total) by mouth  3 (three) times daily as needed. 30 capsule 1  . dexlansoprazole (DEXILANT) 60 MG capsule Take 60 mg by mouth daily.    Marland Kitchen dicyclomine (BENTYL) 10 MG capsule TAKE ONE CAPSULE BY MOUTH 4 TIMES A DAY BEFORE MEALS AND AT BEDTIME 120 capsule 5  . ezetimibe (ZETIA) 10 MG tablet Take 10 mg by mouth daily.      Marland Kitchen gabapentin (NEURONTIN) 100 MG capsule TAKE 1 CAPSULE (100 MG TOTAL) BY MOUTH 3 (THREE) TIMES DAILY. 90 capsule 5  . levocetirizine (XYZAL) 5 MG tablet TAKE 1 TABLET BY MOUTH EVERY DAY 30 tablet 5  . metoprolol (LOPRESSOR) 50 MG tablet Take 50 mg by mouth 2 (two) times daily.      . pantoprazole (PROTONIX) 40 MG tablet AKE 1 TABLET BY MOUTH 2 TIMES A DAY 30 MINUTES PRIOR TO A MEAL 180 tablet 2  . valsartan-hydrochlorothiazide (DIOVAN-HCT) 320-25 MG per tablet Take 1 tablet by mouth daily.     No current facility-administered medications on file prior to visit.    Review of Systems Review of Systems  Constitutional: Negative for fever, appetite change,  and unexpected weight change.  ENT pos for cong and rhinorrhea  and sinus pressure and st  Eyes: Negative for pain and visual disturbance.  Respiratory: Negative for  shortness of breath.   Cardiovascular: Negative for cp or palpitations    Gastrointestinal: Negative for nausea, diarrhea and constipation.  Genitourinary: Negative for urgency and frequency.  Skin: Negative for pallor or rash   Neurological: Negative for weakness, light-headedness, numbness and headaches.  Hematological: Negative for adenopathy. Does not bruise/bleed easily.  Psychiatric/Behavioral: Negative for dysphoric mood. The patient is not nervous/anxious.         Objective:   Physical Exam  Constitutional: She appears well-developed and well-nourished. No distress.  Well appearing   HENT:  Head: Normocephalic and atraumatic.  Right Ear: External ear normal.  Left Ear: External ear normal.  Mouth/Throat: Oropharynx is clear and moist.  Nares are injected and congested  No sinus tenderness Clear rhinorrhea and post nasal drip   Eyes: Conjunctivae and EOM are normal. Pupils are equal, round, and reactive to light. Right eye exhibits no discharge. Left eye exhibits no discharge.  Neck: Normal range of motion. Neck supple.  Cardiovascular: Regular rhythm and normal heart sounds.   Pulmonary/Chest: Effort normal and breath sounds normal. No respiratory distress. She has no wheezes. She has no rales. She exhibits no tenderness.  Harsh bs with scattered rhonchi  Lymphadenopathy:    She has no cervical adenopathy.  Neurological: She is alert.  Skin: Skin is warm and dry. No rash noted.  Lack of pigmentation over linear strip of skin on L dorsal foot and ankle without rash  Psychiatric: She has a normal mood and affect.          Assessment & Plan:   Problem List Items Addressed This Visit      Cardiovascular and Mediastinum   Hypertensive heart disease (Chronic)    bp is up today Possibly due to otc pseudoephedrine-adv to stop any product with "D" in it          Respiratory   Acute bronchitis - Primary    I think you have bronchitis with a cough syndrome Drink lots of fluids  Continue tessalon pills for cough  Use the hycodan when not working or driving (with caution) Take zpak as directed  Rest this weekend  Update if not starting to improve in a week  or if worsening          Musculoskeletal and Integument   Vitiligo    Lack of pigmentation on L dorsal foot and ankle  This corresponds to an area of prev fracture/ boot -unsure if related Ref to dermatology for eval      Relevant Orders   Ambulatory referral to Dermatology

## 2015-01-23 NOTE — Progress Notes (Signed)
Pre visit review using our clinic review tool, if applicable. No additional management support is needed unless otherwise documented below in the visit note. 

## 2015-01-24 NOTE — Assessment & Plan Note (Signed)
I think you have bronchitis with a cough syndrome Drink lots of fluids  Continue tessalon pills for cough  Use the hycodan when not working or driving (with caution) Take zpak as directed  Rest this weekend  Update if not starting to improve in a week or if worsening

## 2015-01-24 NOTE — Assessment & Plan Note (Signed)
Lack of pigmentation on L dorsal foot and ankle  This corresponds to an area of prev fracture/ boot -unsure if related Ref to dermatology for eval

## 2015-01-26 ENCOUNTER — Encounter: Payer: Self-pay | Admitting: Family Medicine

## 2015-01-26 ENCOUNTER — Other Ambulatory Visit: Payer: Self-pay | Admitting: Family Medicine

## 2015-01-26 MED ORDER — CEFDINIR 300 MG PO CAPS: 300.0000 mg | ORAL_CAPSULE | Freq: Two times a day (BID) | ORAL | Status: DC

## 2015-01-26 NOTE — Progress Notes (Signed)
Stop azithro. Change to cefdinir.  rx sent.  Thanks.

## 2015-01-26 NOTE — Progress Notes (Signed)
Patient advised.

## 2015-02-05 ENCOUNTER — Emergency Department (HOSPITAL_COMMUNITY): Payer: 59

## 2015-02-05 ENCOUNTER — Encounter (HOSPITAL_COMMUNITY): Payer: Self-pay | Admitting: *Deleted

## 2015-02-05 ENCOUNTER — Telehealth: Payer: Self-pay | Admitting: Family Medicine

## 2015-02-05 ENCOUNTER — Emergency Department (HOSPITAL_COMMUNITY)
Admission: EM | Admit: 2015-02-05 | Discharge: 2015-02-05 | Disposition: A | Discharge status: Home / Self Care | Payer: 59 | Attending: Emergency Medicine | Admitting: Emergency Medicine

## 2015-02-05 DIAGNOSIS — Z79899 Other long term (current) drug therapy: Secondary | ICD-10-CM | POA: Insufficient documentation

## 2015-02-05 DIAGNOSIS — G43909 Migraine, unspecified, not intractable, without status migrainosus: Secondary | ICD-10-CM | POA: Diagnosis not present

## 2015-02-05 DIAGNOSIS — Z951 Presence of aortocoronary bypass graft: Secondary | ICD-10-CM | POA: Insufficient documentation

## 2015-02-05 DIAGNOSIS — Z7982 Long term (current) use of aspirin: Secondary | ICD-10-CM | POA: Diagnosis not present

## 2015-02-05 DIAGNOSIS — G500 Trigeminal neuralgia: Secondary | ICD-10-CM | POA: Insufficient documentation

## 2015-02-05 DIAGNOSIS — I10 Essential (primary) hypertension: Secondary | ICD-10-CM | POA: Diagnosis not present

## 2015-02-05 DIAGNOSIS — R109 Unspecified abdominal pain: Secondary | ICD-10-CM

## 2015-02-05 DIAGNOSIS — R1084 Generalized abdominal pain: Secondary | ICD-10-CM

## 2015-02-05 DIAGNOSIS — Z8739 Personal history of other diseases of the musculoskeletal system and connective tissue: Secondary | ICD-10-CM | POA: Insufficient documentation

## 2015-02-05 DIAGNOSIS — Z8619 Personal history of other infectious and parasitic diseases: Secondary | ICD-10-CM | POA: Insufficient documentation

## 2015-02-05 DIAGNOSIS — Z862 Personal history of diseases of the blood and blood-forming organs and certain disorders involving the immune mechanism: Secondary | ICD-10-CM | POA: Insufficient documentation

## 2015-02-05 DIAGNOSIS — E785 Hyperlipidemia, unspecified: Secondary | ICD-10-CM | POA: Diagnosis not present

## 2015-02-05 DIAGNOSIS — I2510 Atherosclerotic heart disease of native coronary artery without angina pectoris: Secondary | ICD-10-CM | POA: Insufficient documentation

## 2015-02-05 DIAGNOSIS — Z880 Allergy status to penicillin: Secondary | ICD-10-CM | POA: Diagnosis not present

## 2015-02-05 DIAGNOSIS — K529 Noninfective gastroenteritis and colitis, unspecified: Secondary | ICD-10-CM | POA: Diagnosis not present

## 2015-02-05 DIAGNOSIS — Z90710 Acquired absence of both cervix and uterus: Secondary | ICD-10-CM | POA: Diagnosis not present

## 2015-02-05 LAB — COMPREHENSIVE METABOLIC PANEL
ALT: 19 U/L (ref 14–54)
AST: 25 U/L (ref 15–41)
Albumin: 4 g/dL (ref 3.5–5.0)
Alkaline Phosphatase: 90 U/L (ref 38–126)
Anion gap: 11 (ref 5–15)
BUN: 13 mg/dL (ref 6–20)
CO2: 25 mmol/L (ref 22–32)
Calcium: 10.3 mg/dL (ref 8.9–10.3)
Chloride: 105 mmol/L (ref 101–111)
Creatinine, Ser: 0.86 mg/dL (ref 0.44–1.00)
GFR calc Af Amer: >60 mL/min (ref >60)
GFR calc non Af Amer: >60 mL/min (ref >60)
Glucose, Bld: 113 mg/dL — ABNORMAL HIGH (ref 65–99)
Potassium: 3.7 mmol/L (ref 3.5–5.1)
Sodium: 141 mmol/L (ref 135–145)
Total Bilirubin: 0.5 mg/dL (ref 0.3–1.2)
Total Protein: 7.2 g/dL (ref 6.5–8.1)

## 2015-02-05 LAB — URINALYSIS, ROUTINE W REFLEX MICROSCOPIC
Bilirubin Urine: NEGATIVE
Glucose, UA: NEGATIVE mg/dL
Hgb urine dipstick: NEGATIVE
Ketones, ur: NEGATIVE mg/dL
Leukocytes, UA: NEGATIVE
Nitrite: NEGATIVE
Protein, ur: NEGATIVE mg/dL
Specific Gravity, Urine: 1.007 (ref 1.005–1.030)
pH: 6.5 (ref 5.0–8.0)

## 2015-02-05 LAB — CBC
HCT: 35.1 % — ABNORMAL LOW (ref 36.0–46.0)
Hemoglobin: 12.4 g/dL (ref 12.0–15.0)
MCH: 30.9 pg (ref 26.0–34.0)
MCHC: 35.3 g/dL (ref 30.0–36.0)
MCV: 87.5 fL (ref 78.0–100.0)
Platelets: 235 10*3/uL (ref 150–400)
RBC: 4.01 MIL/uL (ref 3.87–5.11)
RDW: 13.1 % (ref 11.5–15.5)
WBC: 5 10*3/uL (ref 4.0–10.5)

## 2015-02-05 LAB — LIPASE, BLOOD: Lipase: 29 U/L (ref 11–51)

## 2015-02-05 LAB — I-STAT CG4 LACTIC ACID, ED: Lactic Acid, Venous: 0.82 mmol/L (ref 0.5–2.0)

## 2015-02-05 MED ORDER — BARIUM SULFATE 2.1 % PO SUSP
ORAL | Status: AC
  Filled 2015-02-05: qty 2

## 2015-02-05 MED ORDER — KETOROLAC TROMETHAMINE 30 MG/ML IJ SOLN
30.0000 mg | Freq: Once | INTRAMUSCULAR | Status: AC
  Administered 2015-02-05: 30 mg via INTRAVENOUS
  Filled 2015-02-05: qty 1

## 2015-02-05 MED ORDER — CIPROFLOXACIN HCL 500 MG PO TABS: 500.0000 mg | ORAL_TABLET | Freq: Two times a day (BID) | ORAL | Status: DC

## 2015-02-05 MED ORDER — HYDROCODONE-ACETAMINOPHEN 5-325 MG PO TABS
1.0000 | ORAL_TABLET | Freq: Once | ORAL | Status: AC
  Administered 2015-02-05: 1 via ORAL
  Filled 2015-02-05: qty 1

## 2015-02-05 MED ORDER — CIPROFLOXACIN IN D5W 400 MG/200ML IV SOLN
400.0000 mg | Freq: Once | INTRAVENOUS | Status: AC
  Administered 2015-02-05: 400 mg via INTRAVENOUS
  Filled 2015-02-05: qty 200

## 2015-02-05 MED ORDER — METRONIDAZOLE 500 MG PO TABS: 500.0000 mg | ORAL_TABLET | Freq: Two times a day (BID) | ORAL | Status: DC

## 2015-02-05 MED ORDER — TRAMADOL HCL 50 MG PO TABS: 50.0000 mg | ORAL_TABLET | Freq: Four times a day (QID) | ORAL | Status: DC | PRN

## 2015-02-05 MED ORDER — ONDANSETRON HCL 4 MG/2ML IJ SOLN
4.0000 mg | Freq: Once | INTRAMUSCULAR | Status: AC
  Administered 2015-02-05: 4 mg via INTRAVENOUS
  Filled 2015-02-05: qty 2

## 2015-02-05 MED ORDER — SODIUM CHLORIDE 0.9 % IV BOLUS (SEPSIS)
500.0000 mL | Freq: Once | INTRAVENOUS | Status: AC
  Administered 2015-02-05: 500 mL via INTRAVENOUS

## 2015-02-05 MED ORDER — BARIUM SULFATE 2.1 % PO SUSP
ORAL | Status: AC
  Administered 2015-02-05: 450 mL via ORAL
  Filled 2015-02-05: qty 2

## 2015-02-05 MED ORDER — BARIUM SULFATE 2.1 % PO SUSP
450.0000 mL | ORAL | Status: AC
  Administered 2015-02-05 (×2): 450 mL via ORAL

## 2015-02-05 MED ORDER — ONDANSETRON 4 MG PO TBDP: ORAL_TABLET | ORAL | Status: DC

## 2015-02-05 MED ORDER — BARIUM SULFATE 2.1 % PO SUSP: 450.0000 mL | ORAL | Status: DC

## 2015-02-05 MED ORDER — METRONIDAZOLE IN NACL 5-0.79 MG/ML-% IV SOLN
500.0000 mg | Freq: Once | INTRAVENOUS | Status: AC
  Administered 2015-02-05: 500 mg via INTRAVENOUS
  Filled 2015-02-05: qty 100

## 2015-02-05 MED ORDER — PROMETHAZINE HCL 25 MG/ML IJ SOLN
12.5000 mg | Freq: Once | INTRAMUSCULAR | Status: AC
  Administered 2015-02-05: 12.5 mg via INTRAVENOUS
  Filled 2015-02-05: qty 1

## 2015-02-05 MED ORDER — MORPHINE SULFATE (PF) 2 MG/ML IV SOLN
2.0000 mg | Freq: Once | INTRAVENOUS | Status: AC
  Administered 2015-02-05: 2 mg via INTRAVENOUS
  Filled 2015-02-05: qty 1

## 2015-02-05 NOTE — ED Notes (Signed)
Pt reports abdominal pain that started last night. Pt reports that she had N/V/D as well. Pt was sent here by Dr. Loreta Ave for eval of possible bowel obstruction.

## 2015-02-05 NOTE — ED Notes (Signed)
Patient transported to X-ray 

## 2015-02-05 NOTE — ED Provider Notes (Signed)
Handoff received from Dr. Ranae Palms at [phone] with plan for f/u lactic acid for r/o ischemia as etiology for findings on CT.   Results:  BP 160/90 mmHg  Pulse 77  Temp(Src) 98.8 F (37.1 C) (Oral)  Resp 18  SpO2 98%  Results for orders placed or performed during the hospital encounter of 02/05/15  Lipase, blood  Result Value Ref Range   Lipase 29 11 - 51 U/L  Comprehensive metabolic panel  Result Value Ref Range   Sodium 141 135 - 145 mmol/L   Potassium 3.7 3.5 - 5.1 mmol/L   Chloride 105 101 - 111 mmol/L   CO2 25 22 - 32 mmol/L   Glucose, Bld 113 (H) 65 - 99 mg/dL   BUN 13 6 - 20 mg/dL   Creatinine, Ser 9.60 0.44 - 1.00 mg/dL   Calcium 45.4 8.9 - 09.8 mg/dL   Total Protein 7.2 6.5 - 8.1 g/dL   Albumin 4.0 3.5 - 5.0 g/dL   AST 25 15 - 41 U/L   ALT 19 14 - 54 U/L   Alkaline Phosphatase 90 38 - 126 U/L   Total Bilirubin 0.5 0.3 - 1.2 mg/dL   GFR calc non Af Amer >60 >60 mL/min   GFR calc Af Amer >60 >60 mL/min   Anion gap 11 5 - 15  CBC  Result Value Ref Range   WBC 5.0 4.0 - 10.5 K/uL   RBC 4.01 3.87 - 5.11 MIL/uL   Hemoglobin 12.4 12.0 - 15.0 g/dL   HCT 11.9 (L) 14.7 - 82.9 %   MCV 87.5 78.0 - 100.0 fL   MCH 30.9 26.0 - 34.0 pg   MCHC 35.3 30.0 - 36.0 g/dL   RDW 56.2 13.0 - 86.5 %   Platelets 235 150 - 400 K/uL  Urinalysis, Routine w reflex microscopic (not at [facility])  Result Value Ref Range   Color, Urine YELLOW YELLOW   APPearance CLEAR CLEAR   Specific Gravity, Urine 1.007 1.005 - 1.030   pH 6.5 5.0 - 8.0   Glucose, UA NEGATIVE NEGATIVE mg/dL   Hgb urine dipstick NEGATIVE NEGATIVE   Bilirubin Urine NEGATIVE NEGATIVE   Ketones, ur NEGATIVE NEGATIVE mg/dL   Protein, ur NEGATIVE NEGATIVE mg/dL   Nitrite NEGATIVE NEGATIVE   Leukocytes, UA NEGATIVE NEGATIVE    Ct Abdomen Pelvis Wo Contrast  02/05/2015  CLINICAL DATA:  60 year old presenting with acute onset of severe mid abdominal pain associated with nausea, vomiting and diarrhea which began early this morning.  EXAM: CT ABDOMEN AND PELVIS WITHOUT CONTRAST TECHNIQUE: Multidetector CT imaging of the abdomen and pelvis was performed following the standard protocol without IV contrast. Intravenous contrast was not administered due to a history of allergy to iodinated contrast. Oral contrast was administered. COMPARISON:  07/11/2007. FINDINGS: Lower chest: Mild dependent atelectasis posteriorly in the right lower lobe. Visualized lung bases otherwise clear. Heart mildly enlarged. Severe 3 vessel coronary atherosclerosis. Hepatobiliary: Liver normal in size and appearance for the unenhanced technique. Gallbladder normal in appearance without calcified gallstones. No biliary ductal dilation. Pancreas: Normal unenhanced appearance. Spleen: Normal unenhanced appearance. Adrenals/Urinary Tract: Normal appearing adrenal glands. Arterial calcifications involving interpolar arteries in the right kidney mimic urinary tract calculi, but these were visible on the prior CT and are unchanged. No urinary tract calculi or obstruction. Approximate 2 cm simple cyst arising from the upper pole of the left kidney. Within the limits of the unenhanced technique, no solid renal masses. Urinary bladder decompressed and unremarkable. Stomach/Bowel: Gastric antral  prolapse into the duodenal bulb. Stomach otherwise normal in appearance for the degree of distention. Normal-appearing small bowel. Wall thickening involving the distal transverse colon and the proximal and mid descending colon. Distal descending colon, sigmoid colon and rectum completely decompressed. Cecum and ascending colon normal in appearance. Normal-appearing short appendix in the right upper pelvis. Vascular/Lymphatic: Moderate aortoiliofemoral atherosclerosis without aneurysm. No pathologic lymphadenopathy. Reproductive: Surgically absent uterus.  No adnexal masses. Other: Moderate-sized umbilical hernia containing fat. Musculoskeletal: Thoracolumbar scoliosis convex left.  Degenerative disc disease at L3-4 and L4-5 with likely chronic disc protrusions as there is calcification in the posterior annular fibers at these levels. IMPRESSION: 1. Colitis involving the distal transverse colon and the descending colon. This may be infectious, though the distribution can also be seen in ischemic colitis. 2. Gastric antral prolapse into the duodenal bulb. 3. Moderate aorto-iliofemoral and visceral artery atherosclerosis without evidence of aneurysm. 4. Mild dependent atelectasis in the visualized right lower lobe. Electronically Signed   By: Hulan Saas M.D.   On: 02/05/2015 15:29   Dg Abd Acute W/chest  02/05/2015  CLINICAL DATA:  Abdominal pain, bloody diarrhea EXAM: DG ABDOMEN ACUTE W/ 1V CHEST COMPARISON:  02/06/2014 FINDINGS: Cardiomediastinal silhouette is stable. The patient is status post CABG. No acute infiltrate or pulmonary edema. Degenerative changes and levoscoliosis upper lumbar spine. There is normal small bowel gas pattern. No free abdominal air. IMPRESSION: Negative abdominal radiographs. No acute cardiopulmonary disease. Degenerative changes and levoscoliosis lumbar spine. Electronically Signed   By: Natasha Mead M.D.   On: 02/05/2015 12:27    Radiology and laboratory examinations were reviewed by me and used in medical decision making if performed.   Diagnoses that have been ruled out:  None  Diagnoses that are still under consideration:  None  Final diagnoses:  Generalized abdominal pain  Colitis    MDM:  Lactate negative. Will treat as infectious etiology.  Plan to follow up with PCP as needed and return precautions discussed for worsening or new concerning symptoms.    Lyndal Pulley, MD 02/05/15 [id number]

## 2015-02-05 NOTE — ED Provider Notes (Signed)
CSN: [id number]     Arrival date & time 02/05/15  1133 History   First MD Initiated Contact with Patient 02/05/15 1202     Chief Complaint  Patient presents with  . Abdominal Pain     (Consider location/radiation/quality/duration/timing/severity/associated sxs/prior Treatment) HPI She presents with abdominal pain that woke her from sleep this morning around 3 AM. She had nausea and several small bowel movements somewhat with red blood. She states the pain was diffuse and described as cramping. Seen by her physician Dr. Loreta Ave and sent to the emergency department for evaluation for possible small bowel obstruction. Patient states she's never had any previous abdominal surgeries. Denies any fever or chills. No known sick contacts. Past Medical History  Diagnosis Date  . Hypertension   . Hyperlipidemia   . Osteoporosis 11/18/2004  . Migraine     Unsp w/o intract w/o status migrainosus  . IBS (irritable bowel syndrome)   . CAD (coronary artery disease)     Dr. Donnie Aho with Cards  . Leukopenia     with prev unremarkable heme eval ~2011  . Anemia     with prev unremarkable heme eval ~2011  . Shingles   . Trigeminal neuralgia 03/05/2012  . History of gastric ulcer     2005    Past Surgical History  Procedure Laterality Date  . Stress cardiolite  09/11/2008    Probably normal with breast attenuation noted but no evidence of ischemia  . Abdominal hysterectomy  2002  . Tonsillectomy  ~ 1965  . Coronary artery bypass graft  2002   Family History  Problem Relation Age of Onset  . Arthritis Mother   . Diabetes Mother   . Hypertension Mother   . Stroke Father   . Heart disease Father     MI  . Diabetes Father   . Hypertension Father    Social History  Substance Use Topics  . Smoking status: Never Smoker   . Smokeless tobacco: Never Used  . Alcohol Use: No   OB History    No data available     Review of Systems  Constitutional: Negative for fever and chills.  Respiratory:  Negative for cough and shortness of breath.   Cardiovascular: Negative for chest pain.  Gastrointestinal: Positive for nausea, abdominal pain, diarrhea, blood in stool and abdominal distention. Negative for vomiting and constipation.  Genitourinary: Negative for dysuria, frequency and flank pain.  Musculoskeletal: Negative for back pain, neck pain and neck stiffness.  Skin: Negative for rash and wound.  Neurological: Negative for dizziness, weakness, light-headedness, numbness and headaches.  All other systems reviewed and are negative.     Allergies  Amoxicillin-pot clavulanate; Azithromycin; Doxycycline; Ibandronate sodium; Iohexol; Raloxifene; Sulfonamide derivatives; and Zoledronic acid  Home Medications   Prior to Admission medications   Medication Sig Start Date End Date Taking? Authorizing Provider  amLODipine (NORVASC) 5 MG tablet Take 5 mg by mouth daily.   Yes Historical Provider, MD  aspirin 81 MG tablet Take 81 mg by mouth daily.     Yes Historical Provider, MD  atorvastatin (LIPITOR) 80 MG tablet Take 80 mg by mouth daily.   Yes Historical Provider, MD  benzonatate (TESSALON) 200 MG capsule Take 1 capsule (200 mg total) by mouth 3 (three) times daily as needed. Patient taking differently: Take 200 mg by mouth 3 (three) times daily as needed for cough.  01/14/15  Yes Joaquim Nam, MD  dexlansoprazole (DEXILANT) 60 MG capsule Take 60 mg by mouth daily.  Yes Historical Provider, MD  dicyclomine (BENTYL) 10 MG capsule TAKE ONE CAPSULE BY MOUTH 4 TIMES A DAY BEFORE MEALS AND AT BEDTIME 10/01/14  Yes Joaquim Nam, MD  ezetimibe (ZETIA) 10 MG tablet Take 10 mg by mouth daily.     Yes Historical Provider, MD  gabapentin (NEURONTIN) 100 MG capsule TAKE 1 CAPSULE (100 MG TOTAL) BY MOUTH 3 (THREE) TIMES DAILY. 11/12/14  Yes Joaquim Nam, MD  levocetirizine (XYZAL) 5 MG tablet TAKE 1 TABLET BY MOUTH EVERY DAY 10/03/14  Yes Joaquim Nam, MD  metoprolol (LOPRESSOR) 50 MG tablet  Take 50 mg by mouth 2 (two) times daily.     Yes Historical Provider, MD  pantoprazole (PROTONIX) 40 MG tablet AKE 1 TABLET BY MOUTH 2 TIMES A DAY 30 MINUTES PRIOR TO A MEAL 05/26/14  Yes Joaquim Nam, MD  valsartan-hydrochlorothiazide (DIOVAN-HCT) 320-25 MG per tablet Take 1 tablet by mouth daily.   Yes Historical Provider, MD  cefdinir (OMNICEF) 300 MG capsule Take 1 capsule (300 mg total) by mouth 2 (two) times daily. Patient not taking: Reported on 02/05/2015 01/26/15   Joaquim Nam, MD  ciprofloxacin (CIPRO) 500 MG tablet Take 1 tablet (500 mg total) by mouth 2 (two) times daily. One po bid x 7 days 02/05/15   Loren Racer, MD  HYDROcodone-homatropine [facility]) 5-1.5 MG/5ML syrup Take 5 mLs by mouth every 8 (eight) hours as needed for cough (when not working or driving, caution of sedation). Patient not taking: Reported on 02/05/2015 01/23/15   Judy Pimple, MD  metroNIDAZOLE (FLAGYL) 500 MG tablet Take 1 tablet (500 mg total) by mouth 2 (two) times daily. One po bid x 7 days 02/05/15   Loren Racer, MD  ondansetron [facility] ODT) 4 MG disintegrating tablet  ODT q4 hours prn nausea/vomit 02/05/15   Loren Racer, MD  traMADol (ULTRAM) 50 MG tablet Take 1 tablet (50 mg total) by mouth every 6 (six) hours as needed for moderate pain. 02/05/15   Loren Racer, MD   BP 160/90 mmHg  Pulse 77  Temp(Src) 98.8 F (37.1 C) (Oral)  Resp 18  SpO2 98% Physical Exam  Constitutional: She is oriented to person, place, and time. She appears well-developed and well-nourished. No distress.  HENT:  Head: Normocephalic and atraumatic.  Mouth/Throat: Oropharynx is clear and moist.  Eyes: EOM are normal. Pupils are equal, round, and reactive to light.  Neck: Normal range of motion. Neck supple.  Cardiovascular: Normal rate and regular rhythm.   Pulmonary/Chest: Effort normal and breath sounds normal. No respiratory distress. She has no wheezes. She has no rales.  Abdominal: Soft. She exhibits no  distension and no mass. There is tenderness (mild diffuse tenderness to palpation.). There is no rebound and no guarding.  Diminished bowel sounds  Musculoskeletal: Normal range of motion. She exhibits no edema or tenderness.  Neurological: She is alert and oriented to person, place, and time.  Skin: Skin is warm and dry. No rash noted. No erythema.  Psychiatric: She has a normal mood and affect. Her behavior is normal.  Nursing note and vitals reviewed.   ED Course  Procedures (including critical care time) Labs Review Labs Reviewed  COMPREHENSIVE METABOLIC PANEL - Abnormal; Notable for the following:    Glucose, Bld 113 (*)    All other components within normal limits  CBC - Abnormal; Notable for the following:    HCT 35.1 (*)    All other components within normal limits  LIPASE, BLOOD  URINALYSIS, ROUTINE W REFLEX MICROSCOPIC (NOT AT [facility])  I-STAT CG4 LACTIC ACID, ED    Imaging Review Ct Abdomen Pelvis Wo Contrast  02/05/2015  CLINICAL DATA:  60 year old presenting with acute onset of severe mid abdominal pain associated with nausea, vomiting and diarrhea which began early this morning. EXAM: CT ABDOMEN AND PELVIS WITHOUT CONTRAST TECHNIQUE: Multidetector CT imaging of the abdomen and pelvis was performed following the standard protocol without IV contrast. Intravenous contrast was not administered due to a history of allergy to iodinated contrast. Oral contrast was administered. COMPARISON:  07/11/2007. FINDINGS: Lower chest: Mild dependent atelectasis posteriorly in the right lower lobe. Visualized lung bases otherwise clear. Heart mildly enlarged. Severe 3 vessel coronary atherosclerosis. Hepatobiliary: Liver normal in size and appearance for the unenhanced technique. Gallbladder normal in appearance without calcified gallstones. No biliary ductal dilation. Pancreas: Normal unenhanced appearance. Spleen: Normal unenhanced appearance. Adrenals/Urinary Tract: Normal appearing adrenal  glands. Arterial calcifications involving interpolar arteries in the right kidney mimic urinary tract calculi, but these were visible on the prior CT and are unchanged. No urinary tract calculi or obstruction. Approximate 2 cm simple cyst arising from the upper pole of the left kidney. Within the limits of the unenhanced technique, no solid renal masses. Urinary bladder decompressed and unremarkable. Stomach/Bowel: Gastric antral prolapse into the duodenal bulb. Stomach otherwise normal in appearance for the degree of distention. Normal-appearing small bowel. Wall thickening involving the distal transverse colon and the proximal and mid descending colon. Distal descending colon, sigmoid colon and rectum completely decompressed. Cecum and ascending colon normal in appearance. Normal-appearing short appendix in the right upper pelvis. Vascular/Lymphatic: Moderate aortoiliofemoral atherosclerosis without aneurysm. No pathologic lymphadenopathy. Reproductive: Surgically absent uterus.  No adnexal masses. Other: Moderate-sized umbilical hernia containing fat. Musculoskeletal: Thoracolumbar scoliosis convex left. Degenerative disc disease at L3-4 and L4-5 with likely chronic disc protrusions as there is calcification in the posterior annular fibers at these levels. IMPRESSION: 1. Colitis involving the distal transverse colon and the descending colon. This may be infectious, though the distribution can also be seen in ischemic colitis. 2. Gastric antral prolapse into the duodenal bulb. 3. Moderate aorto-iliofemoral and visceral artery atherosclerosis without evidence of aneurysm. 4. Mild dependent atelectasis in the visualized right lower lobe. Electronically Signed   By: Hulan Saas M.D.   On: 02/05/2015 15:29   Dg Abd Acute W/chest  02/05/2015  CLINICAL DATA:  Abdominal pain, bloody diarrhea EXAM: DG ABDOMEN ACUTE W/ 1V CHEST COMPARISON:  02/06/2014 FINDINGS: Cardiomediastinal silhouette is stable. The patient is  status post CABG. No acute infiltrate or pulmonary edema. Degenerative changes and levoscoliosis upper lumbar spine. There is normal small bowel gas pattern. No free abdominal air. IMPRESSION: Negative abdominal radiographs. No acute cardiopulmonary disease. Degenerative changes and levoscoliosis lumbar spine. Electronically Signed   By: Natasha Mead M.D.   On: 02/05/2015 12:27   I have personally reviewed and evaluated these images and lab results as part of my medical decision-making.   EKG Interpretation None      MDM   Final diagnoses:  Generalized abdominal pain  Colitis   Patient states she is feeling much better. Abdominal exam is soft and benign. CT read as possible colitis. Question ischemia by radiologist though this is unlikely. Give IV antibiotics and get lactic acid. Anticipate discharge home if lactic acid is normal to follow-up with the patient's gastroenterologist     Loren Racer, MD 02/06/15 [phone]

## 2015-02-05 NOTE — Telephone Encounter (Signed)
Patient Name: Karen Saunders  DOB: 1955/04/12    Initial Comment Caller states she is having severe abd pain and diarrhea.   Nurse Assessment  Nurse: Stefano Gaul, RN, Dwana Curd Date/Time (Eastern Time): 02/05/2015 9:39:01 AM  Confirm and document reason for call. If symptomatic, describe symptoms. You must click the next button to save text entered. ---Caller states she is having severe abd pain and she is vomiting and having diarrhea. Hurts to move or turn. Pain started around 2-3 am. She had mucus and some blood in her diarrhea. Has had pepto bismol and then vomited. Pain level 10.  Has the patient traveled out of the country within the last 30 days? ---No  Does the patient have any new or worsening symptoms? ---Yes  Will a triage be completed? ---Yes  Related visit to physician within the last 2 weeks? ---No  Does the PT have any chronic conditions? (i.e. diabetes, asthma, etc.) ---Yes  List chronic conditions. ---IBS, diabetes; heart problems; heart bypass surgery  Is this a behavioral health or substance abuse call? ---No     Guidelines    Guideline Title Affirmed Question Affirmed Notes  Abdominal Pain - Female [1] SEVERE pain AND [2] age > 60    Final Disposition User   Go to ED Now Stefano Gaul, RN, Vera    Referrals  [facility] - ED   Disagree/Comply: Comply

## 2015-02-05 NOTE — ED Notes (Signed)
MD called to advise that patient was coming by private vehicle and that there is concern for a possible small bowel obstruction.

## 2015-02-05 NOTE — Discharge Instructions (Signed)

## 2015-02-05 NOTE — ED Notes (Signed)
Pt is in stable condition upon d/c and ambulates from ED. 

## 2015-02-06 NOTE — Telephone Encounter (Signed)
Left detailed message on voicemail asking for return call with update.

## 2015-02-06 NOTE — Telephone Encounter (Signed)
Patient advised.

## 2015-02-06 NOTE — Addendum Note (Signed)
Addended by: Joaquim Nam on: 02/06/2015 05:37 AM   Modules accepted: Orders, Medications

## 2015-02-06 NOTE — Telephone Encounter (Signed)
Glad she is some better.  I was hoping she would have had some improvement.  I wasn't expecting her to be completely improved.  If not continuing to gradually get better, then let me know. Thanks.

## 2015-02-06 NOTE — Telephone Encounter (Signed)
Patient returned Lugene's call.  Patient said she's feeling better than yesterday, but she's still having stomach cramps.

## 2015-02-06 NOTE — Telephone Encounter (Signed)
Will await ER notes.  Thanks.  

## 2015-02-06 NOTE — Telephone Encounter (Addendum)
Please get update on patient.  Seen at ER, started on abx.  Thanks.

## 2015-02-08 ENCOUNTER — Ambulatory Visit (INDEPENDENT_AMBULATORY_CARE_PROVIDER_SITE_OTHER): Payer: 59 | Admitting: Family Medicine

## 2015-02-08 VITALS — BP 124/80 | HR 80 | Temp 98.0°F | Resp 17 | Ht 63.5 in | Wt 135.0 lb

## 2015-02-08 DIAGNOSIS — R05 Cough: Secondary | ICD-10-CM | POA: Diagnosis not present

## 2015-02-08 DIAGNOSIS — R0982 Postnasal drip: Secondary | ICD-10-CM

## 2015-02-08 DIAGNOSIS — R059 Cough, unspecified: Secondary | ICD-10-CM

## 2015-02-08 MED ORDER — HYDROCODONE-HOMATROPINE 5-1.5 MG/5ML PO SYRP: 5.0000 mL | ORAL_SOLUTION | Freq: Three times a day (TID) | ORAL | Status: DC | PRN

## 2015-02-08 NOTE — Progress Notes (Signed)
Chief Complaint:  Chief Complaint  Patient presents with  . Cough  . URI    HPI: Karen Saunders is a 60 y.o. female who reports to [facility] today complaining of:  Cough tha thas recurred, she was recently in the ER and was dx with colitis and also was given cipro and also flagyl without problems.   No CP or SOB or wheezing, No Fevers or chills. The abd pian is improved but not compltely resolved sicn eshe went to ER> CHest xray showed no acute cahgnes. 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   ER note form 02/05/2015.   She presents with abdominal pain that woke her from sleep this morning around 3 AM. She had nausea and several small bowel movements somewhat with red blood. She states the pain was diffuse and described as cramping. Seen by her physician Dr. Loreta Ave and sent to the emergency department for evaluation for possible small bowel obstruction. Patient states she's never had any previous abdominal surgeries. Denies any fever or chills. No known sick contacts.  IMPRESSION: 1. Colitis involving the distal transverse colon and the descending colon. This may be infectious, though the distribution can also be seen in ischemic colitis. 2. Gastric antral prolapse into the duodenal bulb. 3. Moderate aorto-iliofemoral and visceral artery atherosclerosis without evidence of aneurysm. 4. Mild dependent atelectasis in the visualized right lower lobe.   Electronically Signed  By: Hulan Saas M.D.  On: 02/05/2015 15:29  Past Medical History  Diagnosis Date  . Hypertension   . Hyperlipidemia   . Osteoporosis 11/18/2004  . Migraine     Unsp w/o intract w/o status migrainosus  . IBS (irritable bowel syndrome)   . CAD (coronary artery disease)     Dr. Donnie Aho with Cards  . Leukopenia     with prev unremarkable heme eval ~2011  . Anemia     with prev unremarkable heme eval ~2011  . Shingles   . Trigeminal neuralgia 03/05/2012  . History of gastric ulcer    2005    Past Surgical History  Procedure Laterality Date  . Stress cardiolite  09/11/2008    Probably normal with breast attenuation noted but no evidence of ischemia  . Abdominal hysterectomy  2002  . Tonsillectomy  ~ 1965  . Coronary artery bypass graft  2002   Social History   Social History  . Marital Status: Divorced    Spouse Name: N/A  . Number of Children: 1  . Years of Education: N/A   Occupational History  . MEDICAID [employer]  . 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 Smoking status: Never Smoker   . Smokeless tobacco: Never Used  . Alcohol Use: No  . Drug Use: No  . Sexual Activity: Not Asked   Other Topics Concern  . None   Social History Narrative   From [place].   Education:  14 years   Regular Exercise:  Yes   Enjoys time at church and jazz music   Family History  Problem Relation Age of Onset  . Arthritis Mother   . Diabetes Mother   . Hypertension Mother   . Stroke Father   . Heart disease Father     MI  . Diabetes Father   . Hypertension Father    Allergies  Allergen Reactions  . Amoxicillin-Pot Clavulanate     REACTION: Rash on tongue  . Azithromycin  Other (See Comments)    diarrhea  . Doxycycline     GI intolerance  . Ibandronate Sodium     REACTION: GI side effects  . Iohexol      Desc: HIVES,NASAL CONGESTION DURING A CARDIAC CATH. REQUIRES PRE-MEDS.   . Raloxifene     REACTION: GI upset  . Sulfonamide Derivatives     REACTION: itching  . Zoledronic Acid     REACTION: unable to take this as she was intolerant of pre-tx calcium and vitamin D   Prior to Admission medications   Medication Sig Start Date End Date Taking? Authorizing Provider  amLODipine (NORVASC) 5 MG tablet Take 5 mg by mouth daily.   Yes Historical Provider, MD  aspirin 81 MG tablet Take 81 mg by mouth daily.     Yes Historical Provider, MD  atorvastatin (LIPITOR) 80 MG tablet Take 80 mg by mouth  daily.   Yes Historical Provider, MD  benzonatate (TESSALON) 200 MG capsule Take 1 capsule (200 mg total) by mouth 3 (three) times daily as needed. Patient taking differently: Take 200 mg by mouth 3 (three) times daily as needed for cough.  01/14/15  Yes Joaquim Nam, MD  ciprofloxacin (CIPRO) 500 MG tablet Take 1 tablet (500 mg total) by mouth 2 (two) times daily. One po bid x 7 days 02/05/15  Yes Loren Racer, MD  dexlansoprazole (DEXILANT) 60 MG capsule Take 60 mg by mouth daily.   Yes Historical Provider, MD  dicyclomine (BENTYL) 10 MG capsule TAKE ONE CAPSULE BY MOUTH 4 TIMES A DAY BEFORE MEALS AND AT BEDTIME 10/01/14  Yes Joaquim Nam, MD  ezetimibe (ZETIA) 10 MG tablet Take 10 mg by mouth daily.     Yes Historical Provider, MD  gabapentin (NEURONTIN) 100 MG capsule TAKE 1 CAPSULE (100 MG TOTAL) BY MOUTH 3 (THREE) TIMES DAILY. 11/12/14  Yes Joaquim Nam, MD  HYDROcodone-homatropine [facility]) 5-1.5 MG/5ML syrup Take 5 mLs by mouth every 8 (eight) hours as needed for cough (when not working or driving, caution of sedation). 01/23/15  Yes Judy Pimple, MD  levocetirizine (XYZAL) 5 MG tablet TAKE 1 TABLET BY MOUTH EVERY DAY 10/03/14  Yes Joaquim Nam, MD  metoprolol (LOPRESSOR) 50 MG tablet Take 50 mg by mouth 2 (two) times daily.     Yes Historical Provider, MD  metroNIDAZOLE (FLAGYL) 500 MG tablet Take 1 tablet (500 mg total) by mouth 2 (two) times daily. One po bid x 7 days 02/05/15  Yes Loren Racer, MD  ondansetron [facility] ODT) 4 MG disintegrating tablet 4mg  ODT q4 hours prn nausea/vomit 02/05/15  Yes Loren Racer, MD  pantoprazole (PROTONIX) 40 MG tablet AKE 1 TABLET BY MOUTH 2 TIMES A DAY 30 MINUTES PRIOR TO A MEAL 05/26/14  Yes Joaquim Nam, MD  traMADol (ULTRAM) 50 MG tablet Take 1 tablet (50 mg total) by mouth every 6 (six) hours as needed for moderate pain. 02/05/15  Yes Loren Racer, MD  valsartan-hydrochlorothiazide (DIOVAN-HCT) 320-25 MG per tablet Take 1 tablet by  mouth daily.   Yes Historical Provider, MD     ROS: The patient denies fevers, chills, night sweats, unintentional weight loss, chest pain, palpitations, wheezing, dyspnea on exertion, nausea, vomiting, abdominal pain, dysuria, hematuria, melena, numbness, weakness, or tingling.   All other systems have been reviewed and were otherwise negative with the exception of those mentioned in the HPI and as above.    PHYSICAL EXAM: Filed Vitals:   02/08/15 0830  BP:  124/80  Pulse: 80  Temp: 98 F (36.7 C)  Resp: 17   Body mass index is 23.54 kg/(m^2).   General: Alert, no acute distress HEENT:  Normocephalic, atraumatic, oropharynx patent. EOMI, PERRLA Erythematous throat, no exudates, TM normal, no sinus tenderness, + erythematous/boggy nasal mucosa Cardiovascular:  Regular rate and rhythm, no rubs murmurs or gallops.  No Carotid bruits, radial pulse intact. No pedal edema.  Respiratory: Clear to auscultation bilaterally.  No wheezes, rales, or rhonchi.  No cyanosis, no use of accessory musculature Abdominal: No organomegaly, abdomen is soft and non-tender, positive bowel sounds. No masses. Skin: No rashes. Neurologic: Facial musculature symmetric. Psychiatric: Patient acts appropriately throughout our interaction. Lymphatic: No cervical or submandibular lymphadenopathy Musculoskeletal: Gait intact. No edema, tenderness   LABS: Results for orders placed or performed during the hospital encounter of 02/05/15  Lipase, blood  Result Value Ref Range   Lipase 29 11 - 51 U/L  Comprehensive metabolic panel  Result Value Ref Range   Sodium 141 135 - 145 mmol/L   Potassium 3.7 3.5 - 5.1 mmol/L   Chloride 105 101 - 111 mmol/L   CO2 25 22 - 32 mmol/L   Glucose, Bld 113 (H) 65 - 99 mg/dL   BUN 13 6 - 20 mg/dL   Creatinine, Ser 9.14 0.44 - 1.00 mg/dL   Calcium 78.2 8.9 - 95.6 mg/dL   Total Protein 7.2 6.5 - 8.1 g/dL   Albumin 4.0 3.5 - 5.0 g/dL   AST 25 15 - 41 U/L   ALT 19 14 - 54 U/L    Alkaline Phosphatase 90 38 - 126 U/L   Total Bilirubin 0.5 0.3 - 1.2 mg/dL   GFR calc non Af Amer >60 >60 mL/min   GFR calc Af Amer >60 >60 mL/min   Anion gap 11 5 - 15  CBC  Result Value Ref Range   WBC 5.0 4.0 - 10.5 K/uL   RBC 4.01 3.87 - 5.11 MIL/uL   Hemoglobin 12.4 12.0 - 15.0 g/dL   HCT 21.3 (L) 08.6 - 57.8 %   MCV 87.5 78.0 - 100.0 fL   MCH 30.9 26.0 - 34.0 pg   MCHC 35.3 30.0 - 36.0 g/dL   RDW 46.9 62.9 - 52.8 %   Platelets 235 150 - 400 K/uL  Urinalysis, Routine w reflex microscopic (not at [facility])  Result Value Ref Range   Color, Urine YELLOW YELLOW   APPearance CLEAR CLEAR   Specific Gravity, Urine 1.007 1.005 - 1.030   pH 6.5 5.0 - 8.0   Glucose, UA NEGATIVE NEGATIVE mg/dL   Hgb urine dipstick NEGATIVE NEGATIVE   Bilirubin Urine NEGATIVE NEGATIVE   Ketones, ur NEGATIVE NEGATIVE mg/dL   Protein, ur NEGATIVE NEGATIVE mg/dL   Nitrite NEGATIVE NEGATIVE   Leukocytes, UA NEGATIVE NEGATIVE  I-Stat CG4 Lactic Acid, ED  Result Value Ref Range   Lactic Acid, Venous 0.82 0.5 - 2.0 mmol/L     EKG/XRAY:   Primary read interpreted by Dr. Conley Rolls at [facility].   ASSESSMENT/PLAN: Encounter Diagnoses  Name Primary?  . Cough Yes  . PND (post-nasal drip)    60 y/o female with recent ER visit for colitis on CT scan, CHest xray was normal has recurrent cough and PND Already on cipro and flagyl Rx hycodan, cont with tessalon perels prn  Fu prn   Gross sideeffects, risk and benefits, and alternatives of medications d/w patient. Patient is aware that all medications have potential sideeffects and we are unable to  predict every sideeffect or drug-drug interaction that may occur.  Twan Harkin DO  02/08/2015 8:57 AM

## 2015-02-13 ENCOUNTER — Ambulatory Visit: Payer: 59 | Admitting: Family Medicine

## 2015-02-17 ENCOUNTER — Encounter: Payer: Self-pay | Admitting: Family Medicine

## 2015-02-19 ENCOUNTER — Other Ambulatory Visit: Payer: Self-pay | Admitting: Family Medicine

## 2015-02-19 MED ORDER — PREGABALIN 50 MG PO CAPS: 50.0000 mg | ORAL_CAPSULE | Freq: Two times a day (BID) | ORAL | Status: DC

## 2015-02-19 NOTE — Progress Notes (Signed)
Medication phoned to pharmacy.  

## 2015-02-19 NOTE — Progress Notes (Signed)
Please call in lyrica.  I'm sending a mychart message to the patient.  Thanks.

## 2015-03-01 ENCOUNTER — Ambulatory Visit (INDEPENDENT_AMBULATORY_CARE_PROVIDER_SITE_OTHER): Payer: 59 | Admitting: Family Medicine

## 2015-03-01 ENCOUNTER — Ambulatory Visit (INDEPENDENT_AMBULATORY_CARE_PROVIDER_SITE_OTHER): Payer: 59

## 2015-03-01 VITALS — BP 125/84 | HR 73 | Temp 99.1°F | Resp 16 | Ht 63.5 in | Wt 138.0 lb

## 2015-03-01 DIAGNOSIS — I25810 Atherosclerosis of coronary artery bypass graft(s) without angina pectoris: Secondary | ICD-10-CM

## 2015-03-01 DIAGNOSIS — R6889 Other general symptoms and signs: Secondary | ICD-10-CM | POA: Diagnosis not present

## 2015-03-01 DIAGNOSIS — J101 Influenza due to other identified influenza virus with other respiratory manifestations: Secondary | ICD-10-CM

## 2015-03-01 DIAGNOSIS — D72819 Decreased white blood cell count, unspecified: Secondary | ICD-10-CM

## 2015-03-01 LAB — POCT INFLUENZA A/B
Influenza A, POC: POSITIVE — AB
Influenza B, POC: NEGATIVE

## 2015-03-01 LAB — POCT CBC
Granulocyte percent: 46.1 %G (ref 37–80)
HCT, POC: 33.9 % — AB (ref 37.7–47.9)
Hemoglobin: 11.5 g/dL — AB (ref 12.2–16.2)
Lymph, poc: 0.7 (ref 0.6–3.4)
MCH, POC: 30.4 pg (ref 27–31.2)
MCHC: 33.9 g/dL (ref 31.8–35.4)
MCV: 89.6 fL (ref 80–97)
MID (cbc): 0.3 (ref 0–0.9)
MPV: 7.3 fL (ref 0–99.8)
POC Granulocyte: 0.9 — AB (ref 2–6.9)
POC LYMPH PERCENT: 38.6 %L (ref 10–50)
POC MID %: 15.3 %M — AB (ref 0–12)
Platelet Count, POC: 112 10*3/uL — AB (ref 142–424)
RBC: 3.78 M/uL — AB (ref 4.04–5.48)
RDW, POC: 13.4 %
WBC: 1.9 10*3/uL — AB (ref 4.6–10.2)

## 2015-03-01 MED ORDER — OSELTAMIVIR PHOSPHATE 75 MG PO CAPS: 75.0000 mg | ORAL_CAPSULE | Freq: Two times a day (BID) | ORAL | Status: DC

## 2015-03-01 NOTE — Patient Instructions (Addendum)
Because you received an x-ray today, you will receive an invoice from [facility]. Please contact [facility] at [phone] with questions or concerns regarding your invoice. Our billing staff will not be able to assist you with those questions. Influenza, Adult Influenza ("the flu") is a viral infection of the respiratory tract. It occurs more often in winter months because people spend more time in close contact with one another. Influenza can make you feel very sick. Influenza easily spreads from person to person (contagious). CAUSES  Influenza is caused by a virus that infects the respiratory tract. You can catch the virus by breathing in droplets from an infected person's cough or sneeze. You can also catch the virus by touching something that was recently contaminated with the virus and then touching your mouth, nose, or eyes. RISKS AND COMPLICATIONS You may be at risk for a more severe case of influenza if you smoke cigarettes, have diabetes, have chronic heart disease (such as heart failure) or lung disease (such as asthma), or if you have a weakened immune system. Elderly people and pregnant women are also at risk for more serious infections. The most common problem of influenza is a lung infection (pneumonia). Sometimes, this problem can require emergency medical care and may be life threatening. SIGNS AND SYMPTOMS  Symptoms typically last 4 to 10 days and may include:  Fever.  Chills.  Headache, body aches, and muscle aches.  Sore throat.  Chest discomfort and cough.  Poor appetite.  Weakness or feeling tired.  Dizziness.  Nausea or vomiting. DIAGNOSIS  Diagnosis of influenza is often made based on your history and a physical exam. A nose or throat swab test can be done to confirm the diagnosis. TREATMENT  In mild cases, influenza goes away on its own. Treatment is directed at relieving symptoms. For more severe cases, your health care provider may  prescribe antiviral medicines to shorten the sickness. Antibiotic medicines are not effective because the infection is caused by a virus, not by bacteria. HOME CARE INSTRUCTIONS  Take medicines only as directed by your health care provider.  Use a cool mist humidifier to make breathing easier.  Get plenty of rest until your temperature returns to normal. This usually takes 3 to 4 days.  Drink enough fluid to keep your urine clear or pale yellow.  Cover yourmouth and nosewhen coughing or sneezing,and wash your handswellto prevent thevirusfrom spreading.  Stay homefromwork orschool untilthe fever is gonefor at least 1full day. PREVENTION  An annual influenza vaccination (flu shot) is the best way to avoid getting influenza. An annual flu shot is now routinely recommended for all adults in the [place] SEEK MEDICAL CARE IF:  You experiencechest pain, yourcough worsens,or you producemore mucus.  Youhave nausea,vomiting, ordiarrhea.  Your fever returns or gets worse. SEEK IMMEDIATE MEDICAL CARE IF:  You havetrouble breathing, you become short of breath,or your skin ornails becomebluish.  You have severe painor stiffnessin the neck.  You develop a sudden headache, or pain in the face or ear.  You have nausea or vomiting that you cannot control. MAKE SURE YOU:   Understand these instructions.  Will watch your condition.  Will get help right away if you are not doing well or get worse.   This information is not intended to replace advice given to you by your health care provider. Make sure you discuss any questions you have with your health care provider.   Document Released: 12/25/1999 Document Revised: 01/17/2014 Document Reviewed: 03/28/2011 Elsevier Interactive   Patient Education 2016 Elsevier Inc.  

## 2015-03-01 NOTE — Progress Notes (Signed)
Subjective:    Patient ID: Karen Saunders, female    DOB: Aug 24, 1955, 60 y.o.   MRN: [id number]  03/01/2015  Sore Throat; Cough; and Headache   HPI This 60 y.o. female presents for evaluation of cough, sore throat, headache.  Onset 4-5 days ago.  Started with cough; then developed with congestion; started taking Mucinex and nasal rinse.  Now, burning in chest and nose is burning.  Trying to stay in bathroom with steam.  No fever but +chills yesterday.  No sweats.  +mild body aches yesterday while bathing.  Mild headache.  No ear pain; +ST; +rhinorrhea clear; +nasal congstion; +PND.  +coughing; no sputum production; no SOB; no wheezing.  No n/v/d.  Took goody powder to relieve pain.  Also took OTC Tylenol Cold.  No tobacco hx.  No asthma.  +CABG/CAD.  Works part time at [employer].   S/p flu vaccine.  Review of Systems  Constitutional: Positive for chills and fatigue. Negative for fever and diaphoresis.  HENT: Positive for congestion, postnasal drip, rhinorrhea, sore throat and voice change. Negative for ear pain, sinus pressure and trouble swallowing.   Respiratory: Positive for cough. Negative for shortness of breath and wheezing.   Gastrointestinal: Negative for nausea, vomiting and diarrhea.  Neurological: Positive for headaches. Negative for dizziness.    Past Medical History  Diagnosis Date  . Hypertension   . Hyperlipidemia   . Osteoporosis 11/18/2004  . Migraine     Unsp w/o intract w/o status migrainosus  . IBS (irritable bowel syndrome)   . CAD (coronary artery disease)     Dr. Donnie Aho with Cards  . Leukopenia     with prev unremarkable heme eval ~2011  . Anemia     with prev unremarkable heme eval ~2011  . Shingles   . Trigeminal neuralgia 03/05/2012  . History of gastric ulcer     2005    Past Surgical History  Procedure Laterality Date  . Stress cardiolite  09/11/2008    Probably normal with breast attenuation noted but no evidence of ischemia  .  Abdominal hysterectomy  2002  . Tonsillectomy  ~ 1965  . Coronary artery bypass graft  2002   Allergies  Allergen Reactions  . Amoxicillin-Pot Clavulanate     REACTION: Rash on tongue  . Azithromycin Other (See Comments)    diarrhea  . Doxycycline     GI intolerance  . Ibandronate Sodium     REACTION: GI side effects  . Iohexol      Desc: HIVES,NASAL CONGESTION DURING A CARDIAC CATH. REQUIRES PRE-MEDS.   . Raloxifene     REACTION: GI upset  . Sulfonamide Derivatives     REACTION: itching  . Zoledronic Acid     REACTION: unable to take this as she was intolerant of pre-tx calcium and vitamin D   Current Outpatient Prescriptions  Medication Sig Dispense Refill  . amLODipine (NORVASC) 5 MG tablet Take 5 mg by mouth daily.    Marland Kitchen aspirin 81 MG tablet Take 81 mg by mouth daily.      Marland Kitchen atorvastatin (LIPITOR) 80 MG tablet Take 80 mg by mouth daily.    Marland Kitchen dexlansoprazole (DEXILANT) 60 MG capsule Take 60 mg by mouth daily.    Marland Kitchen dicyclomine (BENTYL) 10 MG capsule TAKE ONE CAPSULE BY MOUTH 4 TIMES A DAY BEFORE MEALS AND AT BEDTIME 120 capsule 5  . ezetimibe (ZETIA) 10 MG tablet Take 10 mg by mouth daily.      Marland Kitchen  HYDROcodone-homatropine (HYCODAN) 5-1.5 MG/5ML syrup Take 5 mLs by mouth every 8 (eight) hours as needed for cough (when not working or driving, caution of sedation). 120 mL 0  . levocetirizine (XYZAL) 5 MG tablet TAKE 1 TABLET BY MOUTH EVERY DAY 30 tablet 5  . metoprolol (LOPRESSOR) 50 MG tablet Take 50 mg by mouth 2 (two) times daily.      . pantoprazole (PROTONIX) 40 MG tablet AKE 1 TABLET BY MOUTH 2 TIMES A DAY 30 MINUTES PRIOR TO A MEAL 180 tablet 2  . pregabalin (LYRICA) 50 MG capsule Take 1 capsule (50 mg total) by mouth 2 (two) times daily. 60 capsule 1  . valsartan-hydrochlorothiazide (DIOVAN-HCT) 320-25 MG per tablet Take 1 tablet by mouth daily.    Marland Kitchen oseltamivir (TAMIFLU) 75 MG capsule Take 1 capsule (75 mg total) by mouth 2 (two) times daily. 10 capsule 0   No current  facility-administered medications for this visit.   Social History   Social History  . Marital Status: Divorced    Spouse Name: N/A  . Number of Children: 1  . Years of Education: N/A   Occupational History  . MED[employer]  . 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 Smoking status: Never Smoker   . Smokeless tobacco: Never Used  . Alcohol Use: No  . Drug Use: No  . Sexual Activity: Not on file   Other Topics Concern  . Not on file   Social History Narrative   From [place].   Education:  14 years   Regular Exercise:  Yes   Enjoys time at church and jazz music   Family History  Problem Relation Age of Onset  . Arthritis Mother   . Diabetes Mother   . Hypertension Mother   . Stroke Father   . Heart disease Father     MI  . Diabetes Father   . Hypertension Father        Objective:    BP 125/84 mmHg  Pulse 73  Temp(Src) 99.1 F (37.3 C)  Resp 16  Ht 5' 3.5" (1.613 m)  Wt 138 lb (62.596 kg)  BMI 24.06 kg/m2  SpO2 97% Physical Exam  Constitutional: She is oriented to person, place, and time. She appears well-developed and well-nourished. No distress.  HENT:  Head: Normocephalic and atraumatic.  Right Ear: Tympanic membrane and ear canal normal.  Left Ear: Tympanic membrane and ear canal normal.  Nose: Mucosal edema and rhinorrhea present. Right sinus exhibits no maxillary sinus tenderness and no frontal sinus tenderness. Left sinus exhibits no maxillary sinus tenderness and no frontal sinus tenderness.  Mouth/Throat: Uvula is midline, oropharynx is clear and moist and mucous membranes are normal. No oropharyngeal exudate, posterior oropharyngeal edema, posterior oropharyngeal erythema or tonsillar abscesses.  Eyes: Conjunctivae are normal. Pupils are equal, round, and reactive to light.  Neck: Normal range of motion. Neck supple.  Cardiovascular: Normal rate, regular rhythm and normal heart  sounds.  Exam reveals no gallop and no friction rub.   No murmur heard. Pulmonary/Chest: Effort normal and breath sounds normal. She has no wheezes. She has no rales.  Musculoskeletal: She exhibits no edema.  Neurological: She is alert and oriented to person, place, and time.  Skin: She is not diaphoretic.  Psychiatric: She has a normal mood and affect. Her behavior is normal.  Nursing note and vitals reviewed.  Results for orders placed or performed in visit on  03/01/15  POCT CBC  Result Value Ref Range   WBC 1.9 (A) 4.6 - 10.2 K/uL   Lymph, poc 0.7 0.6 - 3.4   POC LYMPH PERCENT 38.6 10 - 50 %L   MID (cbc) 0.3 0 - 0.9   POC MID % 15.3 (A) 0 - 12 %M   POC Granulocyte 0.9 (A) 2 - 6.9   Granulocyte percent 46.1 37 - 80 %G   RBC 3.78 (A) 4.04 - 5.48 M/uL   Hemoglobin 11.5 (A) 12.2 - 16.2 g/dL   HCT, POC 16.1 (A) 09.6 - 47.9 %   MCV 89.6 80 - 97 fL   MCH, POC 30.4 27 - 31.2 pg   MCHC 33.9 31.8 - 35.4 g/dL   RDW, POC 04.5 %   Platelet Count, POC 112 (A) 142 - 424 K/uL   MPV 7.3 0 - 99.8 fL  POCT Influenza A/B  Result Value Ref Range   Influenza A, POC Positive (A) Negative   Influenza B, POC Negative Negative       Assessment & Plan:   1. Influenza A   2. Flu-like symptoms   3. Atherosclerosis of coronary artery bypass graft of native heart without angina pectoris   4. Leukocytopenia    -New. -Rx for Tamiflu provided due to CAD/CABG. -continue Mucinex and nasal saline spray. -Recommend repeat CBC in 2-4 weeks.  Chronically low WBC count at baseline.  Follow-up PCP.  Expect current WBC count due to influenza infection.    Orders Placed This Encounter  Procedures  . DG Chest 2 View    Standing Status: Future     Number of Occurrences: 1     Standing Expiration Date: 02/29/2016    Order Specific Question:  Reason for Exam (SYMPTOM  OR DIAGNOSIS REQUIRED)    Answer:  burning in chest, cough, fever, body aches, CAD/CABG    Order Specific Question:  Preferred imaging  location?    Answer:  External  . POCT CBC  . POCT Influenza A/B   Meds ordered this encounter  Medications  . oseltamivir (TAMIFLU) 75 MG capsule    Sig: Take 1 capsule (75 mg total) by mouth 2 (two) times daily.    Dispense:  10 capsule    Refill:  0    No Follow-up on file.    Chantale Leugers Paulita Fujita, M.D. [facility] [address] [place], [place]  [phone] phone [phone] fax

## 2015-03-02 ENCOUNTER — Encounter: Payer: Self-pay | Admitting: Family Medicine

## 2015-03-04 ENCOUNTER — Other Ambulatory Visit: Payer: Self-pay | Admitting: Family Medicine

## 2015-03-04 DIAGNOSIS — M5431 Sciatica, right side: Secondary | ICD-10-CM

## 2015-03-04 MED ORDER — HYDROCODONE-ACETAMINOPHEN 5-325 MG PO TABS: 1.0000 | ORAL_TABLET | Freq: Four times a day (QID) | ORAL | Status: DC | PRN

## 2015-03-18 ENCOUNTER — Encounter: Payer: Self-pay | Admitting: Family Medicine

## 2015-04-01 ENCOUNTER — Other Ambulatory Visit: Payer: Self-pay | Admitting: Family Medicine

## 2015-04-02 ENCOUNTER — Other Ambulatory Visit: Payer: Self-pay | Admitting: Family Medicine

## 2015-04-02 MED ORDER — HYDROCODONE-ACETAMINOPHEN 5-325 MG PO TABS: 1.0000 | ORAL_TABLET | Freq: Four times a day (QID) | ORAL | Status: DC | PRN

## 2015-04-02 NOTE — Telephone Encounter (Signed)
Dr. Ermalene SearingBedsole printed and signed Rx.  Patient advised. Rx left at front desk for pick up.

## 2015-04-02 NOTE — Telephone Encounter (Signed)
I need this rx done.  I'm not going to be in today and I'm unsure about tomorrow.  This is a legit request and I would ask for another provider to sign off on this.   Thanks.  

## 2015-04-02 NOTE — Telephone Encounter (Signed)
Electronic refill request.  I don't see that the patient has been on this Rx previously.  Please advise.

## 2015-04-03 ENCOUNTER — Other Ambulatory Visit: Payer: Self-pay | Admitting: Family Medicine

## 2015-04-03 NOTE — Telephone Encounter (Signed)
Patient is taking Claritin-D and says her BP has been doing good.  However, with this caution, she will pay closer attention to her readings and DC the medication if necessary.

## 2015-04-03 NOTE — Telephone Encounter (Signed)
Sent. Thanks.   

## 2015-04-03 NOTE — Telephone Encounter (Signed)
I didn't see where she had been on it either.  Will you clarify with patient about plain claritin vs claritin D? If she has been on claritin D, then how is her BP running? Thanks.

## 2015-04-07 ENCOUNTER — Ambulatory Visit (INDEPENDENT_AMBULATORY_CARE_PROVIDER_SITE_OTHER): Payer: 59 | Admitting: Family Medicine

## 2015-04-07 ENCOUNTER — Encounter: Payer: Self-pay | Admitting: Family Medicine

## 2015-04-07 VITALS — BP 112/76 | HR 76 | Temp 98.5°F | Wt 137.2 lb

## 2015-04-07 DIAGNOSIS — M5431 Sciatica, right side: Secondary | ICD-10-CM

## 2015-04-07 DIAGNOSIS — H6982 Other specified disorders of Eustachian tube, left ear: Secondary | ICD-10-CM

## 2015-04-07 MED ORDER — FLUTICASONE PROPIONATE 50 MCG/ACT NA SUSP: 2.0000 | Freq: Every day | NASAL | Status: DC

## 2015-04-07 NOTE — Progress Notes (Signed)
Pre visit review using our clinic review tool, if applicable. No additional management support is needed unless otherwise documented below in the visit note.  L ear sx/noise for the last few days.  No pain.  No FCNAVD.    Back pain.  Re-injected by Dr. Maurice SmallIbazebo a few days ago.  Still on gabapentin and hydrocodone.  If not better, then the options would be tx with Dr. Maurice SmallIbazebo vs surgery with another doc.  She has tried PT w/o relief.  No weakness, but pain/burning/heat/numbness.  Moving down the R leg now, below the R knee.    Meds, vitals, and allergies reviewed.   ROS: See HPI.  Otherwise, noncontributory.  GEN: nad, alert and oriented HEENT: mucous membranes moist, TM w/o erythema B but no L TM movement on valsalva NECK: supple w/o LA CV: rrr.  no murmur PULM: ctab, no inc wob

## 2015-04-07 NOTE — Patient Instructions (Signed)
Add on flonase and gently try to pop your ears.   This should gradually get better.  Take care.  Glad to see you.

## 2015-04-08 ENCOUNTER — Encounter: Payer: Self-pay | Admitting: Family Medicine

## 2015-04-09 ENCOUNTER — Encounter: Payer: Self-pay | Admitting: Family Medicine

## 2015-04-09 ENCOUNTER — Other Ambulatory Visit: Payer: Self-pay | Admitting: Family Medicine

## 2015-04-09 DIAGNOSIS — H6980 Other specified disorders of Eustachian tube, unspecified ear: Secondary | ICD-10-CM | POA: Insufficient documentation

## 2015-04-09 DIAGNOSIS — H6982 Other specified disorders of Eustachian tube, left ear: Secondary | ICD-10-CM

## 2015-04-09 NOTE — Assessment & Plan Note (Signed)
Continue with tx per spine clinic. App input.

## 2015-04-09 NOTE — Assessment & Plan Note (Signed)
Add on flonase and gently try to pop ears.  D/w pt.  She agrees.

## 2015-04-10 ENCOUNTER — Encounter: Payer: Self-pay | Admitting: Family Medicine

## 2015-04-21 ENCOUNTER — Other Ambulatory Visit: Payer: Self-pay | Admitting: Family Medicine

## 2015-04-21 ENCOUNTER — Encounter: Payer: Self-pay | Admitting: Family Medicine

## 2015-04-22 MED ORDER — HYDROCODONE-ACETAMINOPHEN 5-325 MG PO TABS: 1.0000 | ORAL_TABLET | Freq: Four times a day (QID) | ORAL | Status: DC | PRN

## 2015-04-22 NOTE — Telephone Encounter (Signed)
Patient notified by telephone that script is up front and ready for pickup. 

## 2015-04-22 NOTE — Telephone Encounter (Signed)
Left message on voice mail  to call back

## 2015-04-22 NOTE — Telephone Encounter (Signed)
Printed.  Thanks.  

## 2015-04-22 NOTE — Telephone Encounter (Signed)
Last office visit 04/07/2015.  Last refilled 04/02/2015 for #40 with no refills.  Ok to refill?

## 2015-04-30 ENCOUNTER — Other Ambulatory Visit: Payer: Self-pay | Admitting: Family Medicine

## 2015-05-01 NOTE — Telephone Encounter (Signed)
Electronic refill request. Last Filled:    120 capsule 5 10/01/2014  Please advise.

## 2015-05-03 NOTE — Telephone Encounter (Signed)
Sent. Thanks.   

## 2015-05-07 ENCOUNTER — Encounter: Payer: Self-pay | Admitting: Family Medicine

## 2015-05-24 ENCOUNTER — Other Ambulatory Visit: Payer: Self-pay | Admitting: Family Medicine

## 2015-05-25 MED ORDER — HYDROCODONE-ACETAMINOPHEN 5-325 MG PO TABS: 1.0000 | ORAL_TABLET | Freq: Four times a day (QID) | ORAL | Status: DC | PRN

## 2015-05-25 NOTE — Telephone Encounter (Signed)
Printed.  Thanks.  

## 2015-05-25 NOTE — Telephone Encounter (Signed)
MyChart Refill Request.  Last Filled:    40 tablet 0 04/22/2015  Please advise.

## 2015-05-25 NOTE — Telephone Encounter (Signed)
Patient advised.  Rx left at front desk for pick up. 

## 2015-06-07 ENCOUNTER — Other Ambulatory Visit: Payer: Self-pay | Admitting: Family Medicine

## 2015-06-09 NOTE — Telephone Encounter (Signed)
Electronic refill request. Last Filled:   90    5 RF   On 11/12/2014.  Please advise.

## 2015-06-10 NOTE — Telephone Encounter (Signed)
Sent. Thanks.   

## 2015-06-15 ENCOUNTER — Encounter: Payer: Self-pay | Admitting: Family Medicine

## 2015-06-16 ENCOUNTER — Other Ambulatory Visit: Payer: Self-pay | Admitting: Family Medicine

## 2015-06-16 DIAGNOSIS — R42 Dizziness and giddiness: Secondary | ICD-10-CM

## 2015-06-24 ENCOUNTER — Other Ambulatory Visit: Payer: Self-pay | Admitting: Family Medicine

## 2015-06-25 MED ORDER — FLUCONAZOLE 150 MG PO TABS: 150.0000 mg | ORAL_TABLET | Freq: Once | ORAL | Status: DC

## 2015-06-25 MED ORDER — HYDROCODONE-ACETAMINOPHEN 5-325 MG PO TABS: 1.0000 | ORAL_TABLET | Freq: Four times a day (QID) | ORAL | Status: DC | PRN

## 2015-06-25 NOTE — Telephone Encounter (Signed)
Left detailed message on voicemail. Rx left at front desk for pick up.  

## 2015-06-25 NOTE — Telephone Encounter (Signed)
Diflucan sent, skip lipitor for 2 days when taking diflucan.  Hydrocodone rx printed.  Thanks.

## 2015-07-07 ENCOUNTER — Other Ambulatory Visit: Payer: Self-pay | Admitting: Family Medicine

## 2015-07-09 ENCOUNTER — Other Ambulatory Visit: Payer: Self-pay | Admitting: Family Medicine

## 2015-07-18 ENCOUNTER — Other Ambulatory Visit: Payer: Self-pay | Admitting: Family Medicine

## 2015-07-20 MED ORDER — HYDROCODONE-ACETAMINOPHEN 5-325 MG PO TABS: 1.0000 | ORAL_TABLET | Freq: Four times a day (QID) | ORAL | Status: DC | PRN

## 2015-07-20 NOTE — Telephone Encounter (Signed)
Printed.  Thanks.  

## 2015-07-20 NOTE — Telephone Encounter (Signed)
Left detailed message on voicemail. Rx left at front desk for pick up.  

## 2015-07-20 NOTE — Telephone Encounter (Signed)
MyChart RF request.  Last Filled:    40 tablet 0 06/25/2015  Last office visit:   04/07/15  Please advise.

## 2015-07-21 ENCOUNTER — Encounter: Payer: Self-pay | Admitting: Family Medicine

## 2015-07-22 ENCOUNTER — Encounter: Payer: Self-pay | Admitting: Family Medicine

## 2015-07-23 ENCOUNTER — Other Ambulatory Visit: Payer: Self-pay | Admitting: Family Medicine

## 2015-07-23 DIAGNOSIS — M5431 Sciatica, right side: Secondary | ICD-10-CM

## 2015-08-06 ENCOUNTER — Ambulatory Visit (INDEPENDENT_AMBULATORY_CARE_PROVIDER_SITE_OTHER): Payer: 59 | Admitting: Family Medicine

## 2015-08-06 ENCOUNTER — Encounter: Payer: Self-pay | Admitting: Family Medicine

## 2015-08-06 DIAGNOSIS — H61892 Other specified disorders of left external ear: Secondary | ICD-10-CM | POA: Diagnosis not present

## 2015-08-06 DIAGNOSIS — M519 Unspecified thoracic, thoracolumbar and lumbosacral intervertebral disc disorder: Secondary | ICD-10-CM | POA: Diagnosis not present

## 2015-08-06 MED ORDER — HYDROCODONE-ACETAMINOPHEN 5-325 MG PO TABS: 1.0000 | ORAL_TABLET | Freq: Four times a day (QID) | ORAL | 0 refills | Status: DC | PRN

## 2015-08-06 MED ORDER — HYDROCORTISONE 1 % EX LOTN: 1.0000 "application " | TOPICAL_LOTION | Freq: Two times a day (BID) | CUTANEOUS | 0 refills | Status: DC | PRN

## 2015-08-06 NOTE — Patient Instructions (Signed)
Try hydrocortisone lotion in the ear canal- a small amount twice a day if needed.  Update me as needed.  Take care.  Glad to see you.

## 2015-08-06 NOTE — Progress Notes (Signed)
Pre visit review using our clinic review tool, if applicable. No additional management support is needed unless otherwise documented below in the visit note.  She is considering her options re: back surgery re: her R leg.  She is going to see a Careers adviser in the meantime. She doesn't want surgery, but has tried "everything else."  She is in pain daily, worse in the AM.  No ADE on meds, with some relief of pain but not elimination of pain.  She is able to get by as is for now but she realizes the meds are not a long term solution.    L ear irritation, feels like she needs to clean the canal.  Not frank pain.  Some rhinorrhea.  Some cough at night.  Sick contacts at work.  No fevers.  No vomiting, no diarrhea.    Meds, vitals, and allergies reviewed.   ROS: Per HPI unless specifically indicated in ROS section   GEN: nad, alert and oriented HEENT: mucous membranes moist, TM wnl, L canal appears irritated but not infected.  No pus/discharge.  Pinna wnl NECK: supple w/o LA CV: rrr.  PULM: ctab, no inc wob ABD: soft, +bs

## 2015-08-07 DIAGNOSIS — H61899 Other specified disorders of external ear, unspecified ear: Secondary | ICD-10-CM | POA: Insufficient documentation

## 2015-08-07 NOTE — Assessment & Plan Note (Signed)
rx done for pain meds and she'll be in contact with surgery clinic re: options. No ADE on med, okay for outpatient f/u.

## 2015-08-07 NOTE — Assessment & Plan Note (Signed)
Doesn't appear infected.  Can try hydrocortisone topically and update me as needed.

## 2015-08-19 ENCOUNTER — Encounter: Payer: Self-pay | Admitting: Family Medicine

## 2015-08-27 LAB — HM MAMMOGRAPHY: HM Mammogram: NORMAL (ref 0–4)

## 2015-08-27 LAB — HM PAP SMEAR

## 2015-09-01 ENCOUNTER — Encounter: Payer: Self-pay | Admitting: Family Medicine

## 2015-09-02 ENCOUNTER — Other Ambulatory Visit: Payer: Self-pay | Admitting: Family Medicine

## 2015-09-02 MED ORDER — HYDROCODONE-ACETAMINOPHEN 5-325 MG PO TABS: 1.0000 | ORAL_TABLET | Freq: Four times a day (QID) | ORAL | 0 refills | Status: DC | PRN

## 2015-09-02 NOTE — Progress Notes (Signed)
Spoke to pt. Rx up front. Ronni RumbleFriend Joi Heard may pick it up

## 2015-09-02 NOTE — Progress Notes (Signed)
rx printed.  Thanks.  

## 2015-09-03 ENCOUNTER — Other Ambulatory Visit: Payer: Self-pay | Admitting: Family Medicine

## 2015-09-03 ENCOUNTER — Encounter: Payer: Self-pay | Admitting: Family Medicine

## 2015-09-03 MED ORDER — METHOCARBAMOL 500 MG PO TABS: 500.0000 mg | ORAL_TABLET | Freq: Four times a day (QID) | ORAL | 1 refills | Status: DC

## 2015-09-15 ENCOUNTER — Encounter: Payer: Self-pay | Admitting: Family Medicine

## 2015-09-16 ENCOUNTER — Encounter: Payer: Self-pay | Admitting: Family Medicine

## 2015-09-17 ENCOUNTER — Other Ambulatory Visit: Payer: Self-pay | Admitting: Family Medicine

## 2015-09-17 DIAGNOSIS — M5431 Sciatica, right side: Secondary | ICD-10-CM

## 2015-09-17 NOTE — Progress Notes (Signed)
Referral ordered

## 2015-09-18 ENCOUNTER — Other Ambulatory Visit: Payer: Self-pay | Admitting: Orthopaedic Surgery

## 2015-09-18 DIAGNOSIS — M48061 Spinal stenosis, lumbar region without neurogenic claudication: Secondary | ICD-10-CM

## 2015-09-21 ENCOUNTER — Other Ambulatory Visit: Payer: Self-pay | Admitting: Family Medicine

## 2015-09-22 MED ORDER — HYDROCODONE-ACETAMINOPHEN 5-325 MG PO TABS: 1.0000 | ORAL_TABLET | Freq: Four times a day (QID) | ORAL | 0 refills | Status: DC | PRN

## 2015-09-22 NOTE — Telephone Encounter (Signed)
Printed.  Thanks.  

## 2015-09-22 NOTE — Telephone Encounter (Signed)
Patient notified by telephone that script is up front ready for pickup. 

## 2015-09-27 ENCOUNTER — Ambulatory Visit
Admission: RE | Admit: 2015-09-27 | Discharge: 2015-09-27 | Disposition: A | Discharge status: Home / Self Care | Payer: 59 | Source: Ambulatory Visit | Attending: Orthopaedic Surgery | Admitting: Orthopaedic Surgery

## 2015-09-27 DIAGNOSIS — M48061 Spinal stenosis, lumbar region without neurogenic claudication: Secondary | ICD-10-CM

## 2015-10-03 ENCOUNTER — Encounter: Payer: Self-pay | Admitting: Family Medicine

## 2015-10-05 ENCOUNTER — Encounter: Payer: Self-pay | Admitting: Family Medicine

## 2015-10-06 ENCOUNTER — Other Ambulatory Visit: Payer: Self-pay | Admitting: Family Medicine

## 2015-10-06 MED ORDER — SCOPOLAMINE 1 MG/3DAYS TD PT72: 1.0000 | MEDICATED_PATCH | TRANSDERMAL | 0 refills | Status: DC

## 2015-10-09 ENCOUNTER — Other Ambulatory Visit: Payer: Self-pay | Admitting: Family Medicine

## 2015-10-11 ENCOUNTER — Other Ambulatory Visit: Payer: Self-pay | Admitting: Family Medicine

## 2015-10-12 NOTE — Telephone Encounter (Signed)
Received refill electronically Last refill 09/03/15 #40/1 Last office visit 08/06/15

## 2015-10-12 NOTE — Telephone Encounter (Signed)
MyChart renewal request.  Last Filled:  40 tablet 0 09/22/2015  Please advise.

## 2015-10-13 MED ORDER — HYDROCODONE-ACETAMINOPHEN 5-325 MG PO TABS: 1.0000 | ORAL_TABLET | Freq: Four times a day (QID) | ORAL | 0 refills | Status: DC | PRN

## 2015-10-13 NOTE — Telephone Encounter (Signed)
Sent. Thanks.   

## 2015-10-13 NOTE — Telephone Encounter (Signed)
Printed.  Thanks.  

## 2015-10-13 NOTE — Telephone Encounter (Signed)
Patient advised.  Rx left at front desk for pick up. 

## 2015-10-29 ENCOUNTER — Other Ambulatory Visit: Payer: Self-pay | Admitting: Family Medicine

## 2015-11-05 ENCOUNTER — Encounter: Payer: Self-pay | Admitting: Family Medicine

## 2015-11-18 ENCOUNTER — Other Ambulatory Visit: Payer: Self-pay | Admitting: Family Medicine

## 2015-11-19 MED ORDER — HYDROCODONE-ACETAMINOPHEN 5-325 MG PO TABS: 1.0000 | ORAL_TABLET | Freq: Four times a day (QID) | ORAL | 0 refills | Status: DC | PRN

## 2015-11-19 NOTE — Telephone Encounter (Signed)
Left detailed message on voicemail. Rx left at front desk for pick up.  

## 2015-11-19 NOTE — Telephone Encounter (Signed)
MyChart renewal request.  Last Filled: 40 tablet 0 10/13/2015   Please advise.

## 2015-11-19 NOTE — Telephone Encounter (Signed)
Printed.  Thanks.  

## 2015-11-24 DIAGNOSIS — M48061 Spinal stenosis, lumbar region without neurogenic claudication: Secondary | ICD-10-CM | POA: Insufficient documentation

## 2015-11-24 DIAGNOSIS — M5126 Other intervertebral disc displacement, lumbar region: Secondary | ICD-10-CM | POA: Insufficient documentation

## 2015-11-24 DIAGNOSIS — K219 Gastro-esophageal reflux disease without esophagitis: Secondary | ICD-10-CM | POA: Insufficient documentation

## 2015-12-06 ENCOUNTER — Other Ambulatory Visit: Payer: Self-pay | Admitting: Family Medicine

## 2015-12-07 NOTE — Telephone Encounter (Signed)
Electronic refill request. Last Filled:    90 capsule 5 06/10/2015  Last office visit:   08/06/15 acute.  Please advise.

## 2015-12-08 NOTE — Telephone Encounter (Signed)
Sent. Thanks.   

## 2015-12-10 ENCOUNTER — Other Ambulatory Visit: Payer: Self-pay | Admitting: Family Medicine

## 2015-12-10 ENCOUNTER — Encounter: Payer: Self-pay | Admitting: Family Medicine

## 2015-12-10 MED ORDER — FLUCONAZOLE 150 MG PO TABS: 150.0000 mg | ORAL_TABLET | Freq: Once | ORAL | 0 refills | Status: AC

## 2015-12-17 ENCOUNTER — Other Ambulatory Visit: Payer: Self-pay | Admitting: Family Medicine

## 2015-12-17 ENCOUNTER — Telehealth: Payer: Self-pay | Admitting: Family Medicine

## 2015-12-17 ENCOUNTER — Encounter: Payer: Self-pay | Admitting: Family Medicine

## 2015-12-17 MED ORDER — BENZONATATE 200 MG PO CAPS: 200.0000 mg | ORAL_CAPSULE | Freq: Three times a day (TID) | ORAL | 0 refills | Status: DC | PRN

## 2015-12-17 NOTE — Telephone Encounter (Signed)
See my chart message

## 2015-12-22 ENCOUNTER — Other Ambulatory Visit: Payer: Self-pay | Admitting: Family Medicine

## 2015-12-22 NOTE — Telephone Encounter (Signed)
Received refill request electronically Last refill 05/03/15 #120/5 Last office visit 08/06/15

## 2015-12-23 NOTE — Telephone Encounter (Signed)
Sent. Thanks.   

## 2016-01-07 DIAGNOSIS — Z9889 Other specified postprocedural states: Secondary | ICD-10-CM | POA: Insufficient documentation

## 2016-01-18 DIAGNOSIS — I119 Hypertensive heart disease without heart failure: Secondary | ICD-10-CM | POA: Diagnosis not present

## 2016-01-18 DIAGNOSIS — I2510 Atherosclerotic heart disease of native coronary artery without angina pectoris: Secondary | ICD-10-CM | POA: Diagnosis not present

## 2016-01-18 DIAGNOSIS — E785 Hyperlipidemia, unspecified: Secondary | ICD-10-CM | POA: Diagnosis not present

## 2016-03-03 DIAGNOSIS — R197 Diarrhea, unspecified: Secondary | ICD-10-CM | POA: Diagnosis not present

## 2016-03-23 ENCOUNTER — Encounter: Payer: Self-pay | Admitting: Family Medicine

## 2016-03-24 ENCOUNTER — Telehealth: Payer: Self-pay | Admitting: Family Medicine

## 2016-03-24 NOTE — Telephone Encounter (Signed)
Please call the ENT clinic at [facility] listed in the chart and see if they have any options re: carbamazepine dosing- see mychart message.  Thanks.

## 2016-03-24 NOTE — Telephone Encounter (Signed)
Left message at [facility] for [facility] [place], regarding carbamazepine dosing.

## 2016-03-29 NOTE — Telephone Encounter (Signed)
Left another message on answering machine requesting a call back ASAP.

## 2016-03-29 NOTE — Telephone Encounter (Signed)
See below. Please check on this again when possible and let me know. Thanks.

## 2016-03-29 NOTE — Telephone Encounter (Signed)
[facility] a [facility] from [name] at [facility]. She said that they have made an appt for  Karen PicketBetty Tetreault for 04/14/16 and have left her a message on her machine with all the Appt information. She just wanted to let you know.

## 2016-03-29 NOTE — Telephone Encounter (Signed)
Spoke to nurse Autumn Messing[facility][name][facility] and was advised that Sue Lushndrea is out on maternity leave. [name] stated that the last phone note that they have regarding medication prescribed by Sue Lushndrea is Nortriptyline. [name] faxed over a copy of the phone note back in December. [name] stated that since patient is continuing to have problems she will call the patient and get her back in to be seen. Note is on your desk from [place].

## 2016-03-30 NOTE — Telephone Encounter (Signed)
Thanks

## 2016-04-14 DIAGNOSIS — H9312 Tinnitus, left ear: Secondary | ICD-10-CM | POA: Diagnosis not present

## 2016-04-19 ENCOUNTER — Ambulatory Visit (INDEPENDENT_AMBULATORY_CARE_PROVIDER_SITE_OTHER): Payer: 59 | Admitting: Family Medicine

## 2016-04-19 ENCOUNTER — Encounter: Payer: Self-pay | Admitting: Family Medicine

## 2016-04-19 DIAGNOSIS — R519 Headache, unspecified: Secondary | ICD-10-CM

## 2016-04-19 DIAGNOSIS — R51 Headache: Secondary | ICD-10-CM

## 2016-04-19 MED ORDER — CYCLOBENZAPRINE HCL 10 MG PO TABS: 5.0000 mg | ORAL_TABLET | Freq: Three times a day (TID) | ORAL | 1 refills | Status: DC | PRN

## 2016-04-19 NOTE — Patient Instructions (Signed)
Massage, gently stretch, use heat, try to lay down for a few minutes midday.   Try flexeril- sedation caution.  Update me as needed.   Take care.  Glad to see you.

## 2016-04-19 NOTE — Progress Notes (Signed)
R sided neck pain.  This is a separate issue from previous complaints, ie tinnitus. . D/w pt.  No trauma.  She thought she had slept wrong.  No L sided pain.  Having R sided HA occ.  Goody's help a little.  No other new neuro sx.  Pain with neck ROM but not a true stiff neck. Heat doesn't help much.  Laying down helps some.  Pain is worse at the day goes on when up and moving around.    Her back pain is clearly better.  Nearly resolved.    She is putting up with tinnitus.  Still on carBAMazepine  BID.  She has been able to tolerate medicine.   Meds, vitals, and allergies reviewed.   ROS: Per HPI unless specifically indicated in ROS section   nad ncat TM wnl B Not a stiff neck on exam today.   R occiput ttp w/o rash No midline neck pain No LA rrr ctab No rash

## 2016-04-20 DIAGNOSIS — R51 Headache: Principal | ICD-10-CM

## 2016-04-20 DIAGNOSIS — R519 Headache, unspecified: Secondary | ICD-10-CM | POA: Insufficient documentation

## 2016-04-20 NOTE — Assessment & Plan Note (Signed)
Likely combination of occipital muscle spasm and occipital nerve irritation. Anatomy discussed with patient. No need for imaging at this point. Massage, gently stretch, use heat, try to lay down for a few minutes midday.   Try flexeril- sedation caution.  Update me as needed.   She agrees. No new neurologic symptoms. No radicular arm symptoms

## 2016-04-26 ENCOUNTER — Encounter: Payer: Self-pay | Admitting: Family Medicine

## 2016-04-28 ENCOUNTER — Other Ambulatory Visit: Payer: Self-pay | Admitting: Family Medicine

## 2016-04-28 DIAGNOSIS — R519 Headache, unspecified: Secondary | ICD-10-CM

## 2016-04-28 DIAGNOSIS — R51 Headache: Principal | ICD-10-CM

## 2016-05-12 ENCOUNTER — Encounter: Payer: Self-pay | Admitting: Physical Therapy

## 2016-05-12 ENCOUNTER — Ambulatory Visit: Payer: 59 | Attending: Family Medicine | Admitting: Physical Therapy

## 2016-05-12 DIAGNOSIS — M62838 Other muscle spasm: Secondary | ICD-10-CM | POA: Diagnosis present

## 2016-05-12 DIAGNOSIS — M542 Cervicalgia: Secondary | ICD-10-CM | POA: Insufficient documentation

## 2016-05-13 ENCOUNTER — Encounter: Payer: Self-pay | Admitting: Physical Therapy

## 2016-05-13 NOTE — Therapy (Addendum)
[place], [place], [address] Phone: [phone]   Fax:  [phone]  Physical Therapy Evaluation/Discharge   Patient Details  Name: Karen Saunders MRN: [id number] Date of Birth: [birth year] Referring Provider: Dr Elsie Stain   Encounter Date: 05/12/2016      PT End of Session - 05/13/16 0815    Visit Number 1   Number of Visits 16   Date for PT Re-Evaluation 07/08/16   Authorization Type [employer]    PT Start Time 1330   PT Stop Time 1415   PT Time Calculation (min) 45 min   Activity Tolerance Patient tolerated treatment well   Behavior During Therapy [facility] for tasks assessed/performed      Past Medical History:  Diagnosis Date  . Anemia    with prev unremarkable heme eval ~2011  . CAD (coronary artery disease)    Dr. Wynonia Lawman with Cards  . History of gastric ulcer    2005   . Hyperlipidemia   . Hypertension   . IBS (irritable bowel syndrome)   . Leukopenia    with prev unremarkable heme eval ~2011  . Migraine    Unsp w/o intract w/o status migrainosus  . Osteoporosis 11/18/2004  . Shingles   . Trigeminal neuralgia 03/05/2012    Past Surgical History:  Procedure Laterality Date  . ABDOMINAL HYSTERECTOMY  2002  . CORONARY ARTERY BYPASS GRAFT  2002  . Stress cardiolite  09/11/2008   Probably normal with breast attenuation noted but no evidence of ischemia  . TONSILLECTOMY  ~ 1965    There were no vitals filed for this visit.       Subjective Assessment - 05/12/16 1337    Subjective Patient reports 2-3 months ago he began to have right sided cervical pain. The pain increase when she is working or dirving. The pain can become very intense and when it does it can radiate into her head. She has tried muscle relaxers but they don not help.     Limitations House hold activities   Diagnostic tests Nothing in the cervical spine    Patient Stated Goals To have less pain in her neck    Currently in  Pain? Yes   Pain Score 10-Worst pain ever   Pain Location Neck   Pain Orientation Right   Pain Descriptors / Indicators Sharp;Throbbing   Pain Type Chronic pain   Pain Onset More than a month ago   Aggravating Factors  sitting for too long; use of the computer    Pain Relieving Factors rest, goodys powder    Effect of Pain on Daily Activities Difficulty perfroming work tasks             [facility] PT Assessment - 05/13/16 0001      Assessment   Medical Diagnosis Right sided cervical pain    Referring Provider Dr Elsie Stain    Onset Date/Surgical Date --  3 months prior    Hand Dominance Right   Next MD Visit Nothing schedueled    Prior Therapy None      Precautions   Precautions None   Precaution Comments n     Restrictions   Weight Bearing Restrictions No     Balance Screen   Has the patient fallen in the past 6 months No   Has the patient had a decrease in activity level because of a fear of falling?  No   Is the patient reluctant to leave their home  because of a fear of falling?  No     Home Environment   Additional Comments Notjing significant      Prior Function   Level of Independence Independent   Vocation Full time employment   Vocation Requirements Department of social services; Works at an urgent care checking patients in.    Leisure Nothing      Cognition   Overall Cognitive Status Within Functional Limits for tasks assessed   Attention Focused   Focused Attention Appears intact   Memory Appears intact   Awareness Appears intact   Problem Solving Appears intact     Observation/Other Assessments   Observations Nothing significant     Sensation   Additional Comments denies parathesias into the arm      Coordination   Gross Motor Movements are Fluid and Coordinated Yes   Fine Motor Movements are Fluid and Coordinated Yes     Posture/Postural Control   Posture Comments forward head;      AROM   Cervical Flexion 40 degrees with pain     Cervical Extension 30 degrees with pain    Cervical - Right Rotation 30 with pain    Cervical - Left Rotation 38      PROM   Overall PROM Comments full shoulder PROM      Strength   Overall Strength Comments shoulder strength 5/5      Palpation   Palpation comment spasming in the right upper cervical spine; Spasming in right upper trap but no reported pain.      Special Tests    Special Tests --  compression (-); spurlings (+) on the right                    [facility] Adult PT Treatment/Exercise - 05/13/16 0001      Neck Exercises: Standing   Other Standing Exercises scapular retraction 2x10;  Tennis ball trigger point release 3 min      Neck Exercises: Seated   Other Seated Exercise cervical rotation 2x3 with cuing not to go through pain;                 PT Education - 05/13/16 0815    Education provided Yes   Education Details rationale behind improving cervical range of motion; HEP    Person(s) Educated Patient   Methods Explanation;Demonstration   Comprehension Verbalized understanding;Returned demonstration;Verbal cues required;Tactile cues required          PT Short Term Goals - 05/13/16 0831      PT SHORT TERM GOAL #1   Title Patrient will increase bilateral rotation by 30 degrees    Time 4   Period Weeks   Status New     PT SHORT TERM GOAL #2   Title Patient will demsottrate full cervical flexion with no increase in pain    Time 4   Period Weeks   Status New     PT SHORT TERM GOAL #3   Title Patient will be independent with initial HEP    Time 4   Period Weeks   Status New           PT Long Term Goals - 05/13/16 8469      PT LONG TERM GOAL #1   Title Patient will sit at her desk for a 1 hour without self reported increase in pain    Time 8   Period Weeks   Status New     PT LONG TERM GOAL #  2   Title Patient will score a 37% limitation on FOTO    Time 8   Period Weeks   Status New     PT LONG TERM GOAL #3   Title  Patient will demsotrate 70 degrees of bilateral cervical rotation in order to increase her safety when driving   Time 8   Period Weeks   Status New               Plan - 05/13/16 0816    Clinical Impression Statement Patient is a 61 year old female with right sided high cervical pain. She has significant limitations with all range of motion. She has normal shoulder strength and motion. She has tenderness to palpation on the right side of the cervical spine around c2-c3. She would benefit from skilled therapy to improve cervical motion and decrease pain. she was educated on proper set-up of her desk atwork. she will try to fix her work space set-up.    Rehab Potential Good   PT Frequency 2x / week   PT Duration 8 weeks   PT Treatment/Interventions ADLs/Self Care Home Management;Cryotherapy;Electrical Stimulation;Iontophoresis 29m/ml Dexamethasone;Stair training;Gait training;Moist Heat;Ultrasound;Therapeutic activities;Therapeutic exercise;Neuromuscular re-education;Patient/family education;Passive range of motion;Manual techniques;Taping;Splinting   PT Next Visit Plan review HEP, add chin tucks, Condier mulligans or mulligan self mobilization; continue with manual traction; continue with trigger point release to cervical spine, consider heat if needed   Consulted and Agree with Plan of Care Patient      Patient will benefit from skilled therapeutic intervention in order to improve the following deficits and impairments:  Decreased activity tolerance, Decreased strength, Impaired sensation, Increased muscle spasms, Improper body mechanics, Decreased range of motion, Increased fascial restricitons, Pain  Visit Diagnosis: Cervicalgia - Plan: PT plan of care cert/re-cert  Other muscle spasm - Plan: PT plan of care cert/re-cert   PHYSICAL THERAPY DISCHARGE SUMMARY  Visits from Start of Care: 1  Current functional level related to goals / functional outcomes: Unknown patient declined  treatment    Remaining deficits: Unknown  Education / Equipment: Unknown  Plan: Patient agrees to discharge.  Patient goals were not met. Patient is being discharged due to the patient's request.  ?????       Problem List Patient Active Problem List   Diagnosis Date Noted  . Occipital pain 04/20/2016  . Irritation of external ear canal 08/07/2015  . Vitiligo 01/23/2015  . Lumbar disc disease 01/16/2015  . Right sided sciatica 08/04/2014  . Sleep disorder 07/01/2014  . History of gastric ulcer   . Cervical disc disease   . Anemia 11/10/2010  . Osteoporosis   . Migraine headaches   . Hypertensive heart disease   . Coronary atherosclerosis 01/12/2010  . IBS 01/12/2010  . Hyperlipdemia     DCarney LivingPT DPT 05/13/2016, 8:46 AM  [facility]Onych[address] [place] [address]Phone: [phone]  Fax:  [phone] Name: BCAROLLYN ETCHEVERRYMRN: [id number] of Birth: [birth year]

## 2016-05-15 ENCOUNTER — Other Ambulatory Visit: Payer: Self-pay | Admitting: Family Medicine

## 2016-05-15 NOTE — Progress Notes (Signed)
Agree with plan. Please proceed as stated.  Thanks.

## 2016-05-17 ENCOUNTER — Encounter: Payer: 59 | Admitting: Physical Therapy

## 2016-05-23 ENCOUNTER — Encounter: Payer: 59 | Admitting: Physical Therapy

## 2016-05-24 DIAGNOSIS — R197 Diarrhea, unspecified: Secondary | ICD-10-CM | POA: Diagnosis not present

## 2016-05-24 DIAGNOSIS — Z7689 Persons encountering health services in other specified circumstances: Secondary | ICD-10-CM | POA: Diagnosis not present

## 2016-05-24 DIAGNOSIS — R194 Change in bowel habit: Secondary | ICD-10-CM | POA: Diagnosis not present

## 2016-05-26 ENCOUNTER — Ambulatory Visit: Payer: 59 | Admitting: Physical Therapy

## 2016-05-26 ENCOUNTER — Telehealth: Payer: Self-pay | Admitting: Physical Therapy

## 2016-05-26 NOTE — Telephone Encounter (Signed)
Left voicemail regarding no-show appointment this morning.Asked pt to call and reschedule or cancel if she cannot make her next scheduled appointment on 06/02/2016.

## 2016-05-27 DIAGNOSIS — R197 Diarrhea, unspecified: Secondary | ICD-10-CM | POA: Diagnosis not present

## 2016-06-02 ENCOUNTER — Ambulatory Visit: Payer: 59 | Admitting: Physical Therapy

## 2016-06-03 ENCOUNTER — Encounter: Payer: Self-pay | Admitting: Family Medicine

## 2016-06-03 ENCOUNTER — Telehealth: Payer: Self-pay | Admitting: Physical Therapy

## 2016-06-03 ENCOUNTER — Ambulatory Visit: Payer: 59 | Admitting: Physical Therapy

## 2016-06-03 NOTE — Telephone Encounter (Signed)
Therapy spoke with patient regarding 3rd missed visit. The patient states that she has been too busy with work. Patient and therapy agreed it may be best to hold therapy until she has more time with work. D/C at this time. Patient will need another prescription to resume therapy.

## 2016-06-07 ENCOUNTER — Ambulatory Visit: Payer: 59 | Admitting: Physical Therapy

## 2016-06-09 ENCOUNTER — Ambulatory Visit: Payer: 59 | Admitting: Physical Therapy

## 2016-07-26 ENCOUNTER — Other Ambulatory Visit: Payer: Self-pay | Admitting: Family Medicine

## 2016-08-01 ENCOUNTER — Ambulatory Visit: Payer: 59 | Admitting: Podiatry

## 2016-08-05 DIAGNOSIS — H9312 Tinnitus, left ear: Secondary | ICD-10-CM | POA: Diagnosis not present

## 2016-08-08 ENCOUNTER — Encounter: Payer: Self-pay | Admitting: Family Medicine

## 2016-08-09 ENCOUNTER — Other Ambulatory Visit: Payer: Self-pay | Admitting: Family Medicine

## 2016-08-09 MED ORDER — SCOPOLAMINE 1 MG/3DAYS TD PT72: 1.0000 | MEDICATED_PATCH | TRANSDERMAL | 1 refills | Status: DC

## 2016-08-09 NOTE — Telephone Encounter (Signed)
Pt left v/m; the transderm scope patches that were sent to [facility] [address] were $50.00 and this is too expensive; pt request less expensive substitute for med to take on cruise to [facility] [address]. Unable to reach pt by phone for pt to ck with ins co.

## 2016-08-10 ENCOUNTER — Encounter: Payer: 59 | Admitting: Podiatry

## 2016-08-12 NOTE — Progress Notes (Signed)
This encounter was created in error - please disregard.

## 2016-08-17 ENCOUNTER — Encounter: Payer: Self-pay | Admitting: Family Medicine

## 2016-08-17 ENCOUNTER — Ambulatory Visit (INDEPENDENT_AMBULATORY_CARE_PROVIDER_SITE_OTHER): Payer: 59 | Admitting: Family Medicine

## 2016-08-17 VITALS — BP 168/98 | HR 50 | Temp 98.1°F | Wt 132.0 lb

## 2016-08-17 DIAGNOSIS — J0100 Acute maxillary sinusitis, unspecified: Secondary | ICD-10-CM

## 2016-08-17 MED ORDER — FLUTICASONE PROPIONATE 50 MCG/ACT NA SUSP: 2.0000 | Freq: Every day | NASAL | 6 refills | Status: DC

## 2016-08-17 MED ORDER — PREDNISONE 20 MG PO TABS: 20.0000 mg | ORAL_TABLET | Freq: Every day | ORAL | 0 refills | Status: DC

## 2016-08-17 NOTE — Patient Instructions (Addendum)
Take daily Zyzal, can take small amount sudafed (1 twice a day)- only if blood pressure less than 140/90  Continue plain Mucinex, stop cold medicine  Can take acetaminophen for pain   Sinusitis, Adult Sinusitis is soreness and inflammation of your sinuses. Sinuses are hollow spaces in the bones around your face. Your sinuses are located:  Around your eyes.  In the middle of your forehead.  Behind your nose.  In your cheekbones.  Your sinuses and nasal passages are lined with a stringy fluid (mucus). Mucus normally drains out of your sinuses. When your nasal tissues become inflamed or swollen, the mucus can become trapped or blocked so air cannot flow through your sinuses. This allows bacteria, viruses, and funguses to grow, which leads to infection. Sinusitis can develop quickly and last for 7?10 days (acute) or for more than 12 weeks (chronic). Sinusitis often develops after a cold. What are the causes? This condition is caused by anything that creates swelling in the sinuses or stops mucus from draining, including:  Allergies.  Asthma.  Bacterial or viral infection.  Abnormally shaped bones between the nasal passages.  Nasal growths that contain mucus (nasal polyps).  Narrow sinus openings.  Pollutants, such as chemicals or irritants in the air.  A foreign object stuck in the nose.  A fungal infection. This is rare.  What increases the risk? The following factors may make you more likely to develop this condition:  Having allergies or asthma.  Having had a recent cold or respiratory tract infection.  Having structural deformities or blockages in your nose or sinuses.  Having a weak immune system.  Doing a lot of swimming or diving.  Overusing nasal sprays.  Smoking.  What are the signs or symptoms? The main symptoms of this condition are pain and a feeling of pressure around the affected sinuses. Other symptoms include:  Upper  toothache.  Earache.  Headache.  Bad breath.  Decreased sense of smell and taste.  A cough that may get worse at night.  Fatigue.  Fever.  Thick drainage from your nose. The drainage is often green and it may contain pus (purulent).  Stuffy nose or congestion.  Postnasal drip. This is when extra mucus collects in the throat or back of the nose.  Swelling and warmth over the affected sinuses.  Sore throat.  Sensitivity to light.  How is this diagnosed? This condition is diagnosed based on symptoms, a medical history, and a physical exam. To find out if your condition is acute or chronic, your health care provider may:  Look in your nose for signs of nasal polyps.  Tap over the affected sinus to check for signs of infection.  View the inside of your sinuses using an imaging device that has a light attached (endoscope).  If your health care provider suspects that you have chronic sinusitis, you may also:  Be tested for allergies.  Have a sample of mucus taken from your nose (nasal culture) and checked for bacteria.  Have a mucus sample examined to see if your sinusitis is related to an allergy.  If your sinusitis does not respond to treatment and it lasts longer than 8 weeks, you may have an MRI or CT scan to check your sinuses. These scans also help to determine how severe your infection is. In rare cases, a bone biopsy may be done to rule out more serious types of fungal sinus disease. How is this treated? Treatment for sinusitis depends on the cause and  whether your condition is chronic or acute. If a virus is causing your sinusitis, your symptoms will go away on their own within 10 days. You may be given medicines to relieve your symptoms, including:  Topical nasal decongestants. They shrink swollen nasal passages and let mucus drain from your sinuses.  Antihistamines. These drugs block inflammation that is triggered by allergies. This can help to ease swelling in  your nose and sinuses.  Topical nasal corticosteroids. These are nasal sprays that ease inflammation and swelling in your nose and sinuses.  Nasal saline washes. These rinses can help to get rid of thick mucus in your nose.  If your condition is caused by bacteria, you will be given an antibiotic medicine. If your condition is caused by a fungus, you will be given an antifungal medicine. Surgery may be needed to correct underlying conditions, such as narrow nasal passages. Surgery may also be needed to remove polyps. Follow these instructions at home: Medicines  Take, use, or apply over-the-counter and prescription medicines only as told by your health care provider. These may include nasal sprays.  If you were prescribed an antibiotic medicine, take it as told by your health care provider. Do not stop taking the antibiotic even if you start to feel better. Hydrate and Humidify  Drink enough water to keep your urine clear or pale yellow. Staying hydrated will help to thin your mucus.  Use a cool mist humidifier to keep the humidity level in your home above 50%.  Inhale steam for 10-15 minutes, 3-4 times a day or as told by your health care provider. You can do this in the bathroom while a hot shower is running.  Limit your exposure to cool or dry air. Rest  Rest as much as possible.  Sleep with your head raised (elevated).  Make sure to get enough sleep each night. General instructions  Apply a warm, moist washcloth to your face 3-4 times a day or as told by your health care provider. This will help with discomfort.  Wash your hands often with soap and water to reduce your exposure to viruses and other germs. If soap and water are not available, use hand sanitizer.  Do not smoke. Avoid being around people who are smoking (secondhand smoke).  Keep all follow-up visits as told by your health care provider. This is important. Contact a health care provider if:  You have a  fever.  Your symptoms get worse.  Your symptoms do not improve within 10 days. Get help right away if:  You have a severe headache.  You have persistent vomiting.  You have pain or swelling around your face or eyes.  You have vision problems.  You develop confusion.  Your neck is stiff.  You have trouble breathing. This information is not intended to replace advice given to you by your health care provider. Make sure you discuss any questions you have with your health care provider. Document Released: 12/27/2004 Document Revised: 08/23/2015 Document Reviewed: 10/22/2014 Elsevier Interactive Patient Education  2017 [employer].

## 2016-08-17 NOTE — Progress Notes (Signed)
Subjective:    Patient ID: Karen Saunders, female    DOB: 08/27/1955, 61 y.o.   MRN: [id number]  HPI This is a 61 yo female who presents today with sinus symptoms x 4 days. Symptoms started with light cough, sinus pressure, left side worse than right. No ear pain, ears feel itchy. Non productive cough, no sore throat. No wheezing or SOB or chest pain. Has been taking Mucinex, Goody powder, OTC cold preparations- no relief. Using neti pot. Has not taken any of her blood pressure medications today. Has blood pressure cuff at home she can use.  She brought her OTC medications with her and was found to be taking two different cold preparations containing the same ingredients (acetaminophen, dextromethorphan and phenylephrine).   Past Medical History:  Diagnosis Date  . Anemia    with prev unremarkable heme eval ~2011  . CAD (coronary artery disease)    Dr. Donnie Aho with Cards  . History of gastric ulcer    2005   . Hyperlipidemia   . Hypertension   . IBS (irritable bowel syndrome)   . Leukopenia    with prev unremarkable heme eval ~2011  . Migraine    Unsp w/o intract w/o status migrainosus  . Osteoporosis 11/18/2004  . Shingles   . Trigeminal neuralgia 03/05/2012   Past Surgical History:  Procedure Laterality Date  . ABDOMINAL HYSTERECTOMY  2002  . CORONARY ARTERY BYPASS GRAFT  2002  . Stress cardiolite  09/11/2008   Probably normal with breast attenuation noted but no evidence of ischemia  . TONSILLECTOMY  ~ 1965   Family History  Problem Relation Age of Onset  . Arthritis Mother   . Diabetes Mother   . Hypertension Mother   . Stroke Father   . Heart disease Father        MI  . Diabetes Father   . Hypertension Father    Social History  Substance Use Topics  . Smoking status: Never Smoker  . Smokeless tobacco: Never Used  . Alcohol use No      Review of Systems Per HPI    Objective:   Physical Exam  Constitutional: She is oriented to person, place, and time. She  appears well-developed and well-nourished. No distress.  HENT:  Head: Normocephalic and atraumatic.  Right Ear: Tympanic membrane, external ear and ear canal normal.  Left Ear: Tympanic membrane, external ear and ear canal normal.  Nose: Mucosal edema present. Right sinus exhibits maxillary sinus tenderness. Right sinus exhibits no frontal sinus tenderness. Left sinus exhibits maxillary sinus tenderness. Left sinus exhibits no frontal sinus tenderness.  Mouth/Throat: Uvula is midline, oropharynx is clear and moist and mucous membranes are normal.  Eyes: Conjunctivae are normal.  Neck: Normal range of motion. Neck supple.  Cardiovascular: Regular rhythm and normal heart sounds.  Bradycardia present.   Pulmonary/Chest: Effort normal and breath sounds normal.  Lymphadenopathy:    She has no cervical adenopathy.  Neurological: She is alert and oriented to person, place, and time.  Skin: Skin is warm and dry. She is not diaphoretic.  Psychiatric: She has a normal mood and affect. Her behavior is normal. Judgment and thought content normal.  Vitals reviewed.     BP (!) 180/120 (BP Location: Right Arm, Patient Position: Sitting, Cuff Size: Normal)   Pulse (!) 50   Temp 98.1 F (36.7 C) (Oral)   Wt 132 lb (59.9 kg)   SpO2 98%   BMI 23.02 kg/m  Wt Readings from Last  3 Encounters:  08/17/16 132 lb (59.9 kg)  04/19/16 137 lb (62.1 kg)  04/07/15 137 lb 4 oz (62.3 kg)   BP Readings from Last 3 Encounters:  08/17/16 (!) 168/98  04/19/16 (!) 146/86  08/06/15 128/74   Recheck HR, auscultation 58    Assessment & Plan:  1. Acute non-recurrent maxillary sinusitis - reviewed OTC medications and provided written instructions - predniSONE (DELTASONE) 20 MG tablet; Take 1 tablet (20 mg total) by mouth daily with breakfast.  Dispense: 5 tablet; Refill: 0- reviewed potential side effects - fluticasone (FLONASE) 50 MCG/ACT nasal spray; Place 2 sprays into both nostrils daily.  Dispense: 16 g;  Refill: 6 - RTC precautions reviewed  2. Elevated blood pressure reading - likely related to combination of not taking BP meds today and over use of cold medication - instructed patient to avoid decongestants until BP returns to normal and to check daily at home  Olean Reeeborah Gessner, FNP-BC  [facility] [place], [place]  08/17/2016 10:40 AM

## 2016-09-25 ENCOUNTER — Other Ambulatory Visit: Payer: Self-pay | Admitting: Family Medicine

## 2016-10-21 ENCOUNTER — Telehealth: Payer: Self-pay | Admitting: Family Medicine

## 2016-10-21 NOTE — Telephone Encounter (Signed)
[facility] at [facility] last night. RX sent for Dicyclomine is not available and on back order. Please call pharmacy at [phone] to change the medication for your patient.Call from [place].

## 2016-10-23 MED ORDER — HYOSCYAMINE SULFATE 0.125 MG PO TABS: 0.1250 mg | ORAL_TABLET | ORAL | 1 refills | Status: DC | PRN

## 2016-10-23 NOTE — Telephone Encounter (Signed)
Would be okay to try to substitute hyoscyamine for dicyclomine.  rx sent.  Thanks.   Update [place] as needed.

## 2016-10-24 DIAGNOSIS — H918X2 Other specified hearing loss, left ear: Secondary | ICD-10-CM | POA: Diagnosis not present

## 2016-10-24 DIAGNOSIS — H9312 Tinnitus, left ear: Secondary | ICD-10-CM | POA: Diagnosis not present

## 2016-10-24 NOTE — Telephone Encounter (Signed)
Patient advised.

## 2016-11-04 ENCOUNTER — Telehealth: Payer: Self-pay | Admitting: Emergency Medicine

## 2016-11-04 ENCOUNTER — Encounter: Payer: Self-pay | Admitting: Family Medicine

## 2016-11-04 ENCOUNTER — Ambulatory Visit (INDEPENDENT_AMBULATORY_CARE_PROVIDER_SITE_OTHER): Payer: 59 | Admitting: Family Medicine

## 2016-11-04 VITALS — BP 160/88 | HR 59 | Temp 98.0°F | Wt 131.0 lb

## 2016-11-04 DIAGNOSIS — G43909 Migraine, unspecified, not intractable, without status migrainosus: Secondary | ICD-10-CM

## 2016-11-04 DIAGNOSIS — F419 Anxiety disorder, unspecified: Secondary | ICD-10-CM

## 2016-11-04 MED ORDER — SERTRALINE HCL 50 MG PO TABS: 50.0000 mg | ORAL_TABLET | Freq: Every day | ORAL | 3 refills | Status: DC

## 2016-11-04 NOTE — Patient Instructions (Signed)
Please try Tylenol for headaches and decrease Goody's powders  Please schedule follow up in 4-6 weeks

## 2016-11-04 NOTE — Progress Notes (Signed)
Subjective:    Patient ID: RHYLEI MCQUAIG, female    DOB: 1955/01/27, 61 y.o.   MRN: [id number]  HPI This is a 61 yo female who presents today with increased stress and headaches. She is very close to being able to retire from her job and feels as though they are trying to force her out. Has been in her current position for 29 years. Has never had any problems until recently. She has new supervisor and is getting frequent complaints. She is looking to retire a little earlier. Dreads going to work, decreased appetite, diarrhea. Sleeping ok. Takes dexlansoprazole and dicylamine. Has never needed medication for stress/anxiety prior. Has good support system. On pins and needles.   Has been having daily frontal headache. Is taking Goody powders 2-3 times a day. Good relief with Goody's. Last eye exam 01/2016. No visual changes, no nausea. Does not wake up with them. Not usually on weekends, just work days. Allergies well controlled.   Past Medical History:  Diagnosis Date  . Anemia    with prev unremarkable heme eval ~2011  . CAD (coronary artery disease)    Dr. Donnie Aho with Cards  . History of gastric ulcer    2005   . Hyperlipidemia   . Hypertension   . IBS (irritable bowel syndrome)   . Leukopenia    with prev unremarkable heme eval ~2011  . Migraine    Unsp w/o intract w/o status migrainosus  . Osteoporosis 11/18/2004  . Shingles   . Trigeminal neuralgia 03/05/2012   Past Surgical History:  Procedure Laterality Date  . ABDOMINAL HYSTERECTOMY  2002  . CORONARY ARTERY BYPASS GRAFT  2002  . Stress cardiolite  09/11/2008   Probably normal with breast attenuation noted but no evidence of ischemia  . TONSILLECTOMY  ~ 1965   Family History  Problem Relation Age of Onset  . Arthritis Mother   . Diabetes Mother   . Hypertension Mother   . Stroke Father   . Heart disease Father        MI  . Diabetes Father   . Hypertension Father    Social History  Substance Use Topics  . Smoking  status: Never Smoker  . Smokeless tobacco: Never Used  . Alcohol use No      Review of Systems Per HPI    Objective:   Physical Exam  Constitutional: She is oriented to person, place, and time. She appears well-developed and well-nourished. No distress.  HENT:  Head: Normocephalic and atraumatic.  Eyes: Conjunctivae are normal.  Cardiovascular: Normal rate.   Pulmonary/Chest: Effort normal.  Neurological: She is alert and oriented to person, place, and time.  Skin: She is not diaphoretic.  Psychiatric: She has a normal mood and affect. Her behavior is normal. Judgment and thought content normal.  Tearful at times.   Vitals reviewed.     BP (!) 160/88 (BP Location: Left Arm, Patient Position: Sitting, Cuff Size: Normal)   Pulse (!) 59   Temp 98 F (36.7 C) (Oral)   Wt 131 lb (59.4 kg)   SpO2 98%   BMI 22.84 kg/m  Wt Readings from Last 3 Encounters:  11/04/16 131 lb (59.4 kg)  08/17/16 132 lb (59.9 kg)  04/19/16 137 lb (62.1 kg)   Depression screen [facility] 2/9 11/04/2016 03/01/2015 02/08/2015 07/31/2014  Decreased Interest 0 0 0 0  Down, Depressed, Hopeless 0 0 0 0  PHQ - 2 Score 0 0 0 0   BP Readings from  Last 3 Encounters:  11/04/16 (!) 160/88  08/17/16 (!) 168/98  04/19/16 (!) 146/86        Assessment & Plan:  1. Anxiety - offered her referral for counseling, she declined at this time, sttes she can talk with her pastor and has other support. Discussed starting SSRI and she was agreeable.  - sertraline (ZOLOFT) 50 MG tablet; Take 1 tablet (50 mg total) by mouth at bedtime. Take 1/2 tablet for 1 week then take 1 tablet daily  Dispense: 30 tablet; Refill: 3 - RTC precautions reviewed - follow up in 4-6 weeks, sooner if worsening symptoms  2. Migraine without status migrainosus, not intractable, unspecified migraine type - situational, possibly worse due to over use of NSAIDs, encouraged her to try acetaminophen and prioritize sleep, healthy food choices, regular  exercise   Olean Reeeborah Gessner, FNP-BC  [facility], [facility]  11/04/2016 10:22 AM

## 2016-11-04 NOTE — Telephone Encounter (Signed)
Called and left message for pt informing her that work noted has been printed and placed upfront for pick up.   Copied from CRM [id number]. Topic: Inquiry >> Nov 04, 2016  3:01 PM Diana EvesHoyt, Maryann B wrote: Reason for CRM: pt would like a work note for Monday and Tuesday of next week  >> Nov 04, 2016  4:12 PM Emi BelfastGessner, Deborah B, FNP wrote: Lynden OxfordOW letter written. Please notify patient. She can either print from Mychart or pick up from office.

## 2016-11-07 ENCOUNTER — Encounter: Payer: Self-pay | Admitting: Family Medicine

## 2016-11-09 ENCOUNTER — Encounter: Payer: Self-pay | Admitting: Family Medicine

## 2016-11-10 ENCOUNTER — Encounter: Payer: Self-pay | Admitting: *Deleted

## 2016-11-24 DIAGNOSIS — J069 Acute upper respiratory infection, unspecified: Secondary | ICD-10-CM | POA: Diagnosis not present

## 2016-12-09 ENCOUNTER — Ambulatory Visit: Payer: 59 | Admitting: Family Medicine

## 2016-12-12 ENCOUNTER — Ambulatory Visit: Payer: 59 | Admitting: Family Medicine

## 2016-12-15 ENCOUNTER — Encounter: Payer: Self-pay | Admitting: Family Medicine

## 2016-12-15 ENCOUNTER — Ambulatory Visit: Payer: 59 | Admitting: Family Medicine

## 2016-12-15 DIAGNOSIS — J069 Acute upper respiratory infection, unspecified: Secondary | ICD-10-CM

## 2016-12-15 DIAGNOSIS — I10 Essential (primary) hypertension: Secondary | ICD-10-CM

## 2016-12-15 MED ORDER — DOXYCYCLINE HYCLATE 100 MG PO TABS: 100.0000 mg | ORAL_TABLET | Freq: Two times a day (BID) | ORAL | 0 refills | Status: DC

## 2016-12-15 MED ORDER — HYDROCODONE-HOMATROPINE 5-1.5 MG/5ML PO SYRP: 5.0000 mL | ORAL_SOLUTION | Freq: Three times a day (TID) | ORAL | 0 refills | Status: DC | PRN

## 2016-12-15 NOTE — Progress Notes (Signed)
She had sinus pressure and pain with cough.  [facility].  Never got totally better.  Prev with blood mucous.  She was on doxy prev (was able to tolerate with food), recently finished- earlier this week.  She has been using a nasal rinse.    She has less ear ringing with carbamazepine.  It has helped some.  If she skips the med, then ringing comes back.    Meds, vitals, and allergies reviewed.   ROS: Per HPI unless specifically indicated in ROS section   GEN: nad, alert and oriented HEENT: mucous membranes moist, tm w/o erythema, nasal exam w/o erythema, clear discharge noted,  OP with cobblestoning, sinuses not ttp  NECK: supple w/o LA CV: rrr.   PULM: ctab, no inc wob, no wheeze.  EXT: no edema

## 2016-12-15 NOTE — Patient Instructions (Addendum)
Some of the valsartan meds were recalled- check with your pharmacy. Try to limit afrin.  Keep using flonase.  Keep gargling with salt water.  Keep taking mucinex.   Use the cough medicine as needed.  If more facial pain then restart doxy.  Take care.  Glad to see you.

## 2016-12-16 NOTE — Assessment & Plan Note (Addendum)
Nontoxic.  Try to limit afrin.  Keep using flonase.  Keep gargling with salt water.  Keep taking mucinex.   Use the cough medicine as needed.  Routine cautions given.   If more facial pain then restart doxy.  She agrees.   Okay for outpatient f/u.

## 2016-12-16 NOTE — Assessment & Plan Note (Signed)
D/w pt about valsartan recall, see AVS.

## 2016-12-20 DIAGNOSIS — R1110 Vomiting, unspecified: Secondary | ICD-10-CM | POA: Diagnosis not present

## 2016-12-20 DIAGNOSIS — K529 Noninfective gastroenteritis and colitis, unspecified: Secondary | ICD-10-CM | POA: Diagnosis not present

## 2016-12-26 ENCOUNTER — Other Ambulatory Visit: Payer: Self-pay | Admitting: Family Medicine

## 2017-01-20 ENCOUNTER — Encounter: Payer: Self-pay | Admitting: Family Medicine

## 2017-01-20 DIAGNOSIS — E7849 Other hyperlipidemia: Secondary | ICD-10-CM | POA: Diagnosis not present

## 2017-01-20 DIAGNOSIS — I2510 Atherosclerotic heart disease of native coronary artery without angina pectoris: Secondary | ICD-10-CM | POA: Diagnosis not present

## 2017-01-20 DIAGNOSIS — E785 Hyperlipidemia, unspecified: Secondary | ICD-10-CM | POA: Diagnosis not present

## 2017-01-20 DIAGNOSIS — I119 Hypertensive heart disease without heart failure: Secondary | ICD-10-CM | POA: Diagnosis not present

## 2017-01-22 ENCOUNTER — Other Ambulatory Visit: Payer: Self-pay | Admitting: Family Medicine

## 2017-01-22 MED ORDER — FLUCONAZOLE 150 MG PO TABS: 150.0000 mg | ORAL_TABLET | Freq: Once | ORAL | 0 refills | Status: AC

## 2017-01-22 NOTE — Progress Notes (Signed)
rx sent

## 2017-01-30 ENCOUNTER — Other Ambulatory Visit: Payer: Self-pay | Admitting: Family Medicine

## 2017-01-30 DIAGNOSIS — I25810 Atherosclerosis of coronary artery bypass graft(s) without angina pectoris: Secondary | ICD-10-CM

## 2017-01-30 NOTE — Progress Notes (Signed)
Notify pt.  Saw notes from cards.  Needs f/u lipid panel.  [facility] here.  Ordered.  Thanks.   Left detailed message on voicemail.  Ninetta LightsL. Fuquay, CMA  01/30/17

## 2017-02-03 ENCOUNTER — Ambulatory Visit (INDEPENDENT_AMBULATORY_CARE_PROVIDER_SITE_OTHER): Payer: 59

## 2017-02-03 ENCOUNTER — Encounter: Payer: Self-pay | Admitting: Podiatry

## 2017-02-03 ENCOUNTER — Ambulatory Visit: Payer: 59 | Admitting: Podiatry

## 2017-02-03 DIAGNOSIS — M722 Plantar fascial fibromatosis: Secondary | ICD-10-CM | POA: Diagnosis not present

## 2017-02-03 MED ORDER — TRIAMCINOLONE ACETONIDE 10 MG/ML IJ SUSP
10.0000 mg | Freq: Once | INTRAMUSCULAR | Status: AC
  Administered 2017-02-03: 10 mg

## 2017-02-03 NOTE — Patient Instructions (Signed)

## 2017-02-03 NOTE — Progress Notes (Signed)
Subjective:   Patient ID: Karen Saunders, female   DOB: 8[birth year] y.o.   MRN: [id number]   HPI Patient presents stating she developed a lot of pain in the bottom of the right heel and is been going on for around 6 months   ROS      Objective:  Physical Exam  Neurovascular status found to be intact muscle strength adequate with exquisite discomfort plantar aspect right heel at the insertional point of the tendon into the calcaneus     Assessment:  Acute plantar fasciitis right     Plan:  H&P condition reviewed and injected the plantar fascia right 3 mg Kenalog 5 mg Xylocaine and applied fascial brace with instructions.  Gave instructions on reduced activity and physical therapy and reappoint in 2 weeks  X-rays indicate that there is small spur but no indication of stress fracture arthritis

## 2017-02-15 ENCOUNTER — Other Ambulatory Visit: Payer: Self-pay | Admitting: Family Medicine

## 2017-02-15 ENCOUNTER — Encounter: Payer: Self-pay | Admitting: Family Medicine

## 2017-02-15 MED ORDER — HYDROCODONE-HOMATROPINE 5-1.5 MG/5ML PO SYRP: 2.5000 mL | ORAL_SOLUTION | Freq: Three times a day (TID) | ORAL | 0 refills | Status: DC | PRN

## 2017-02-15 NOTE — Progress Notes (Signed)
rx sent, see mychart message

## 2017-02-17 ENCOUNTER — Ambulatory Visit: Payer: 59 | Admitting: Podiatry

## 2017-02-24 ENCOUNTER — Encounter: Payer: Self-pay | Admitting: Podiatry

## 2017-02-24 ENCOUNTER — Ambulatory Visit: Payer: 59 | Admitting: Podiatry

## 2017-02-24 DIAGNOSIS — M722 Plantar fascial fibromatosis: Secondary | ICD-10-CM

## 2017-02-24 NOTE — Progress Notes (Signed)
Subjective:   Patient ID: Karen Saunders, female   DOB: 62 y.o.   MRN: [id number]   HPI Patient states he will is feeling a lot better with significant reduction of discomfort   ROS      Objective:  Physical Exam  Neurovascular status intact with discomfort right heel that is improved but is still present upon deep palpation but quite a bit better     Assessment:  Doing much better with chronic plantar fascial symptomatology right     Plan:  Reviewed the importance of physical therapy anti-inflammatory supportive shoes and at this time discharge patient and will be seen back on an as-needed basis

## 2017-03-02 IMAGING — DX DG ABDOMEN ACUTE W/ 1V CHEST
3 series · 3 of 3 positions shown · non-contrast
Comparison: 02/06/2014

CLINICAL DATA: Abdominal pain, bloody diarrhea

EXAM:
DG ABDOMEN ACUTE W/ 1V CHEST

[chest pa]
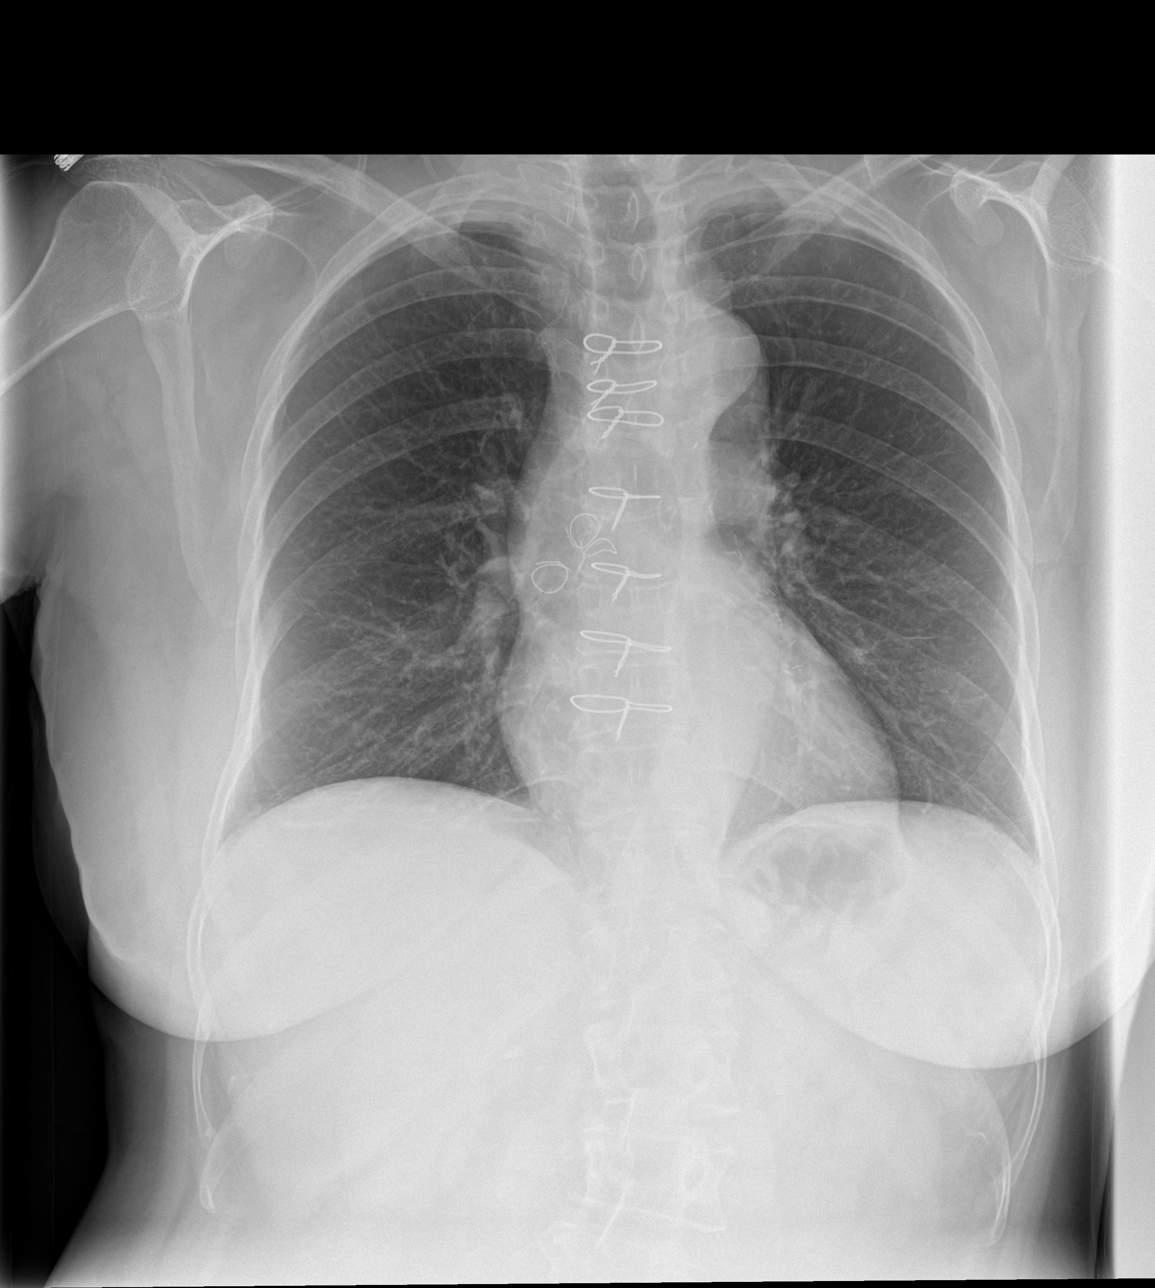

[abdomen erect]
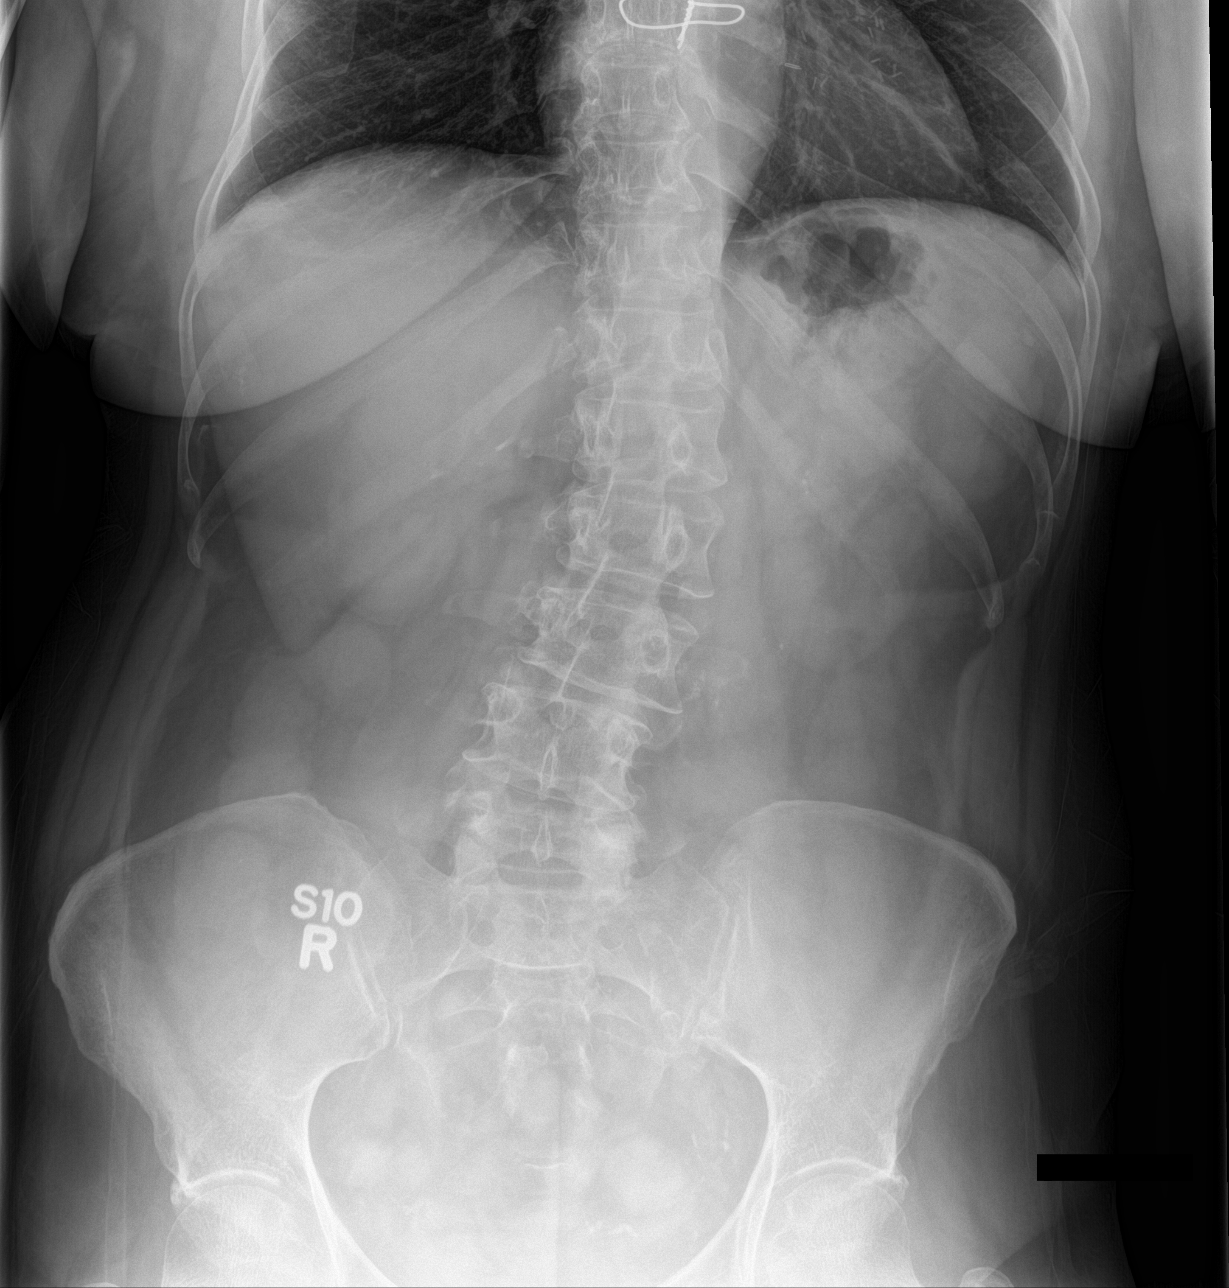

[abdomen supine]
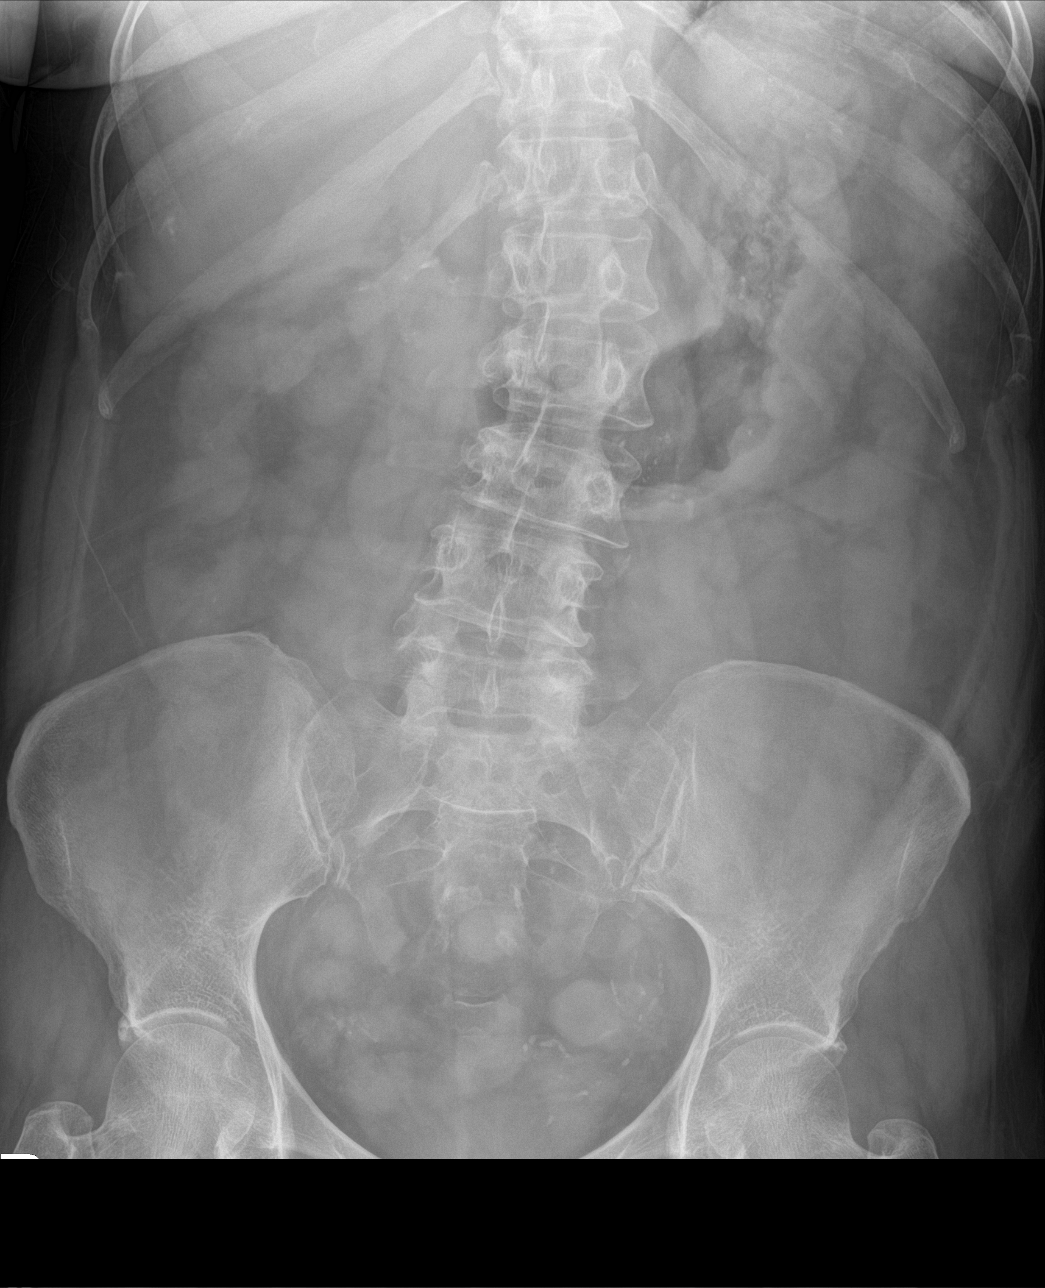

[3 of 3 positions shown; findings below may reference images not displayed]

FINDINGS: Cardiomediastinal silhouette is stable. The patient is status post
CABG. No acute infiltrate or pulmonary edema. Degenerative changes
and levoscoliosis upper lumbar spine. There is normal small bowel
gas pattern. No free abdominal air.
IMPRESSION: Negative abdominal radiographs. No acute cardiopulmonary disease.
Degenerative changes and levoscoliosis lumbar spine.

## 2017-03-02 IMAGING — CT CT ABD-PELV W/O CM
2 of 4 series · 11 of 46 positions shown, 12 images · non-contrast
Comparison: 07/11/2007.

CLINICAL DATA: 59-year-old presenting with acute onset of severe
mid abdominal pain associated with nausea, vomiting and diarrhea
which began early this morning.

EXAM:
CT ABDOMEN AND PELVIS WITHOUT CONTRAST
TECHNIQUE: Multidetector CT imaging of the abdomen and pelvis was performed
following the standard protocol without IV contrast. Intravenous
contrast was not administered due to a history of allergy to
iodinated contrast. Oral contrast was administered.

[Series 201: routine, idose (2) · axial · 0.78mm/px · z∈[+37,+407]mm · 8 of 90 slices shown, 9 images]
[im 8/90  soft-tissue]
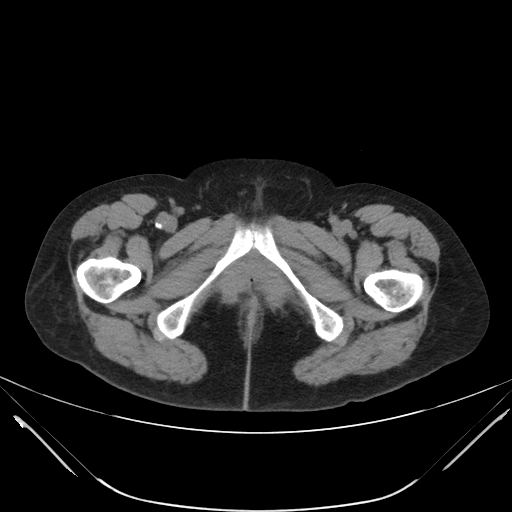
[im 8/90  bone]
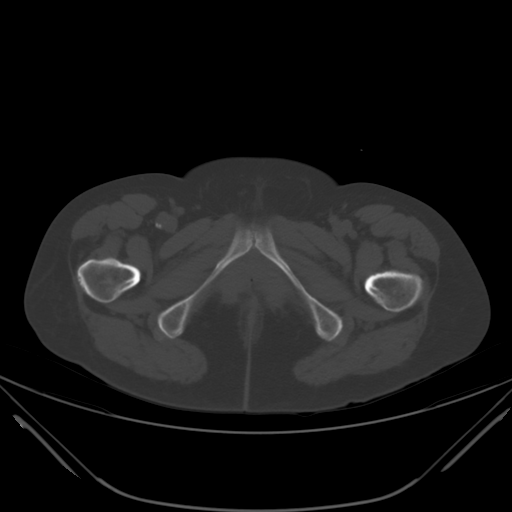
[im 20/90  soft-tissue]
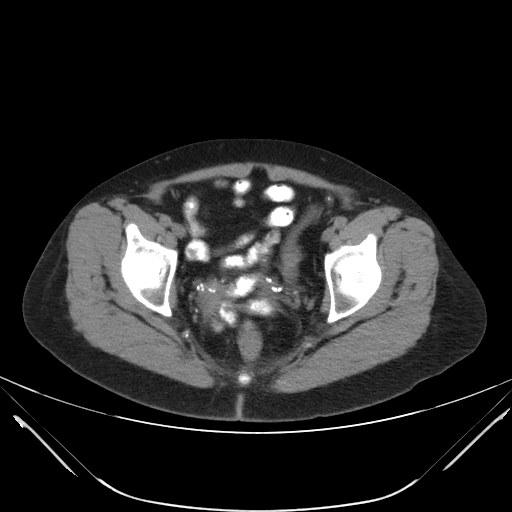
[im 28/90  soft-tissue]
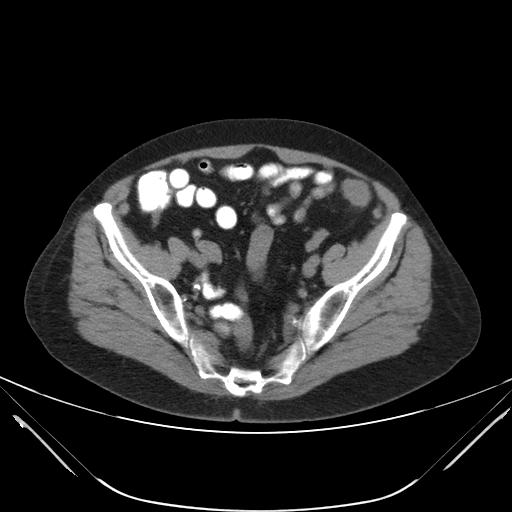
[im 39/90  soft-tissue]
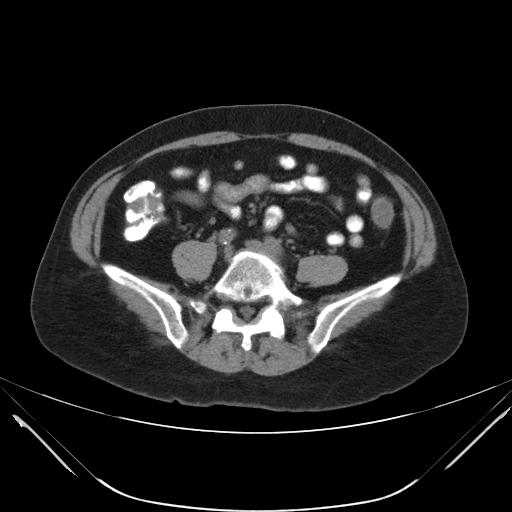
[im 51/90  soft-tissue]
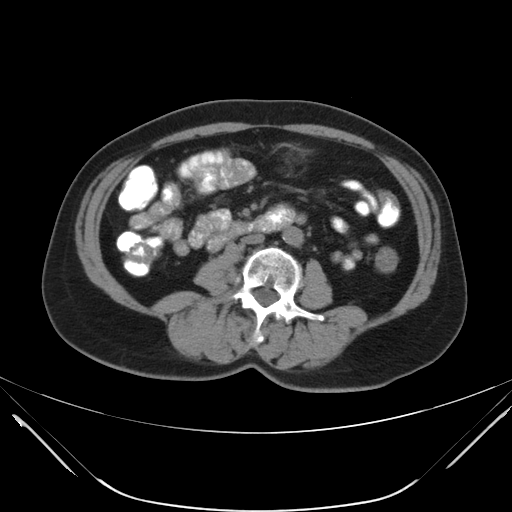
[im 62/90  soft-tissue]
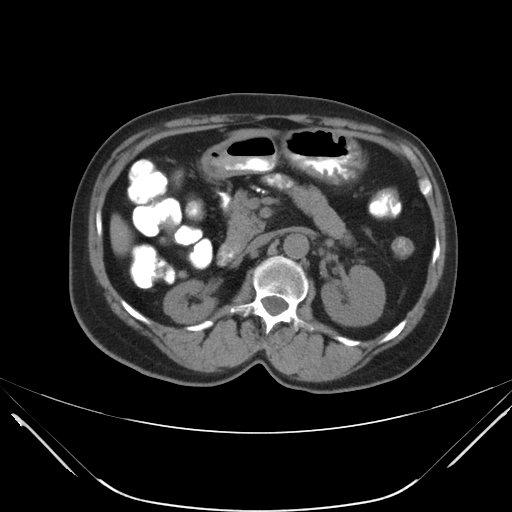
[im 70/90  soft-tissue]
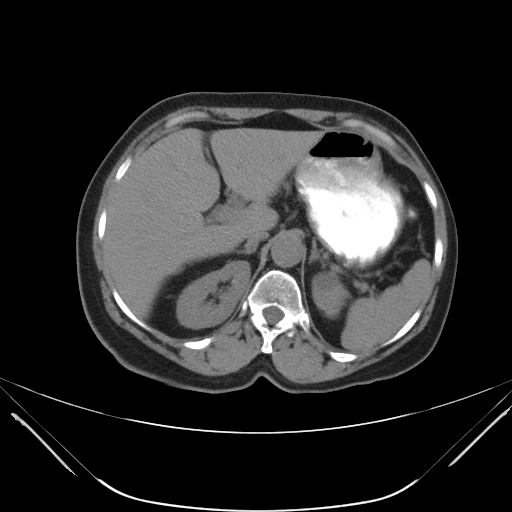
[im 82/90  soft-tissue]
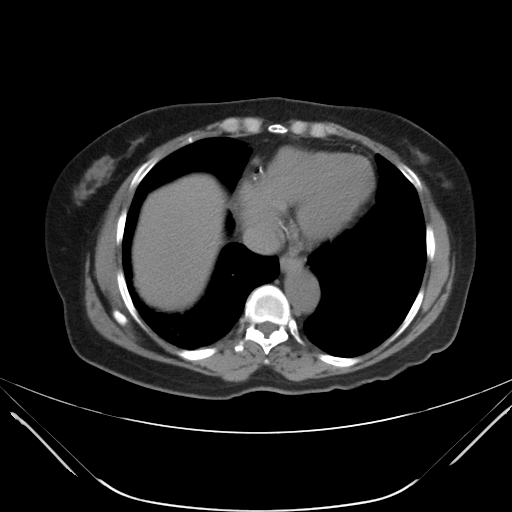

[Series 203: coronals, idose (2) · coronal · 0.45mm/px · 3 of 141 slices shown]
[im 47/141  soft-tissue]
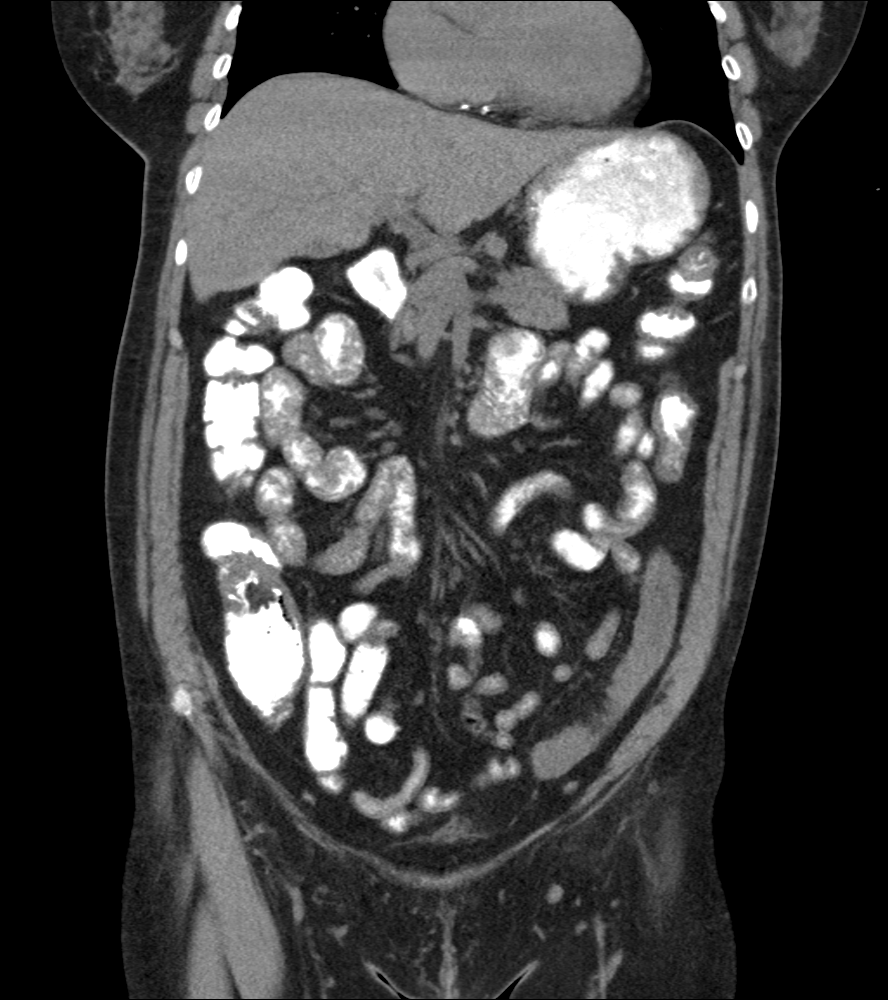
[im 63/141  soft-tissue]
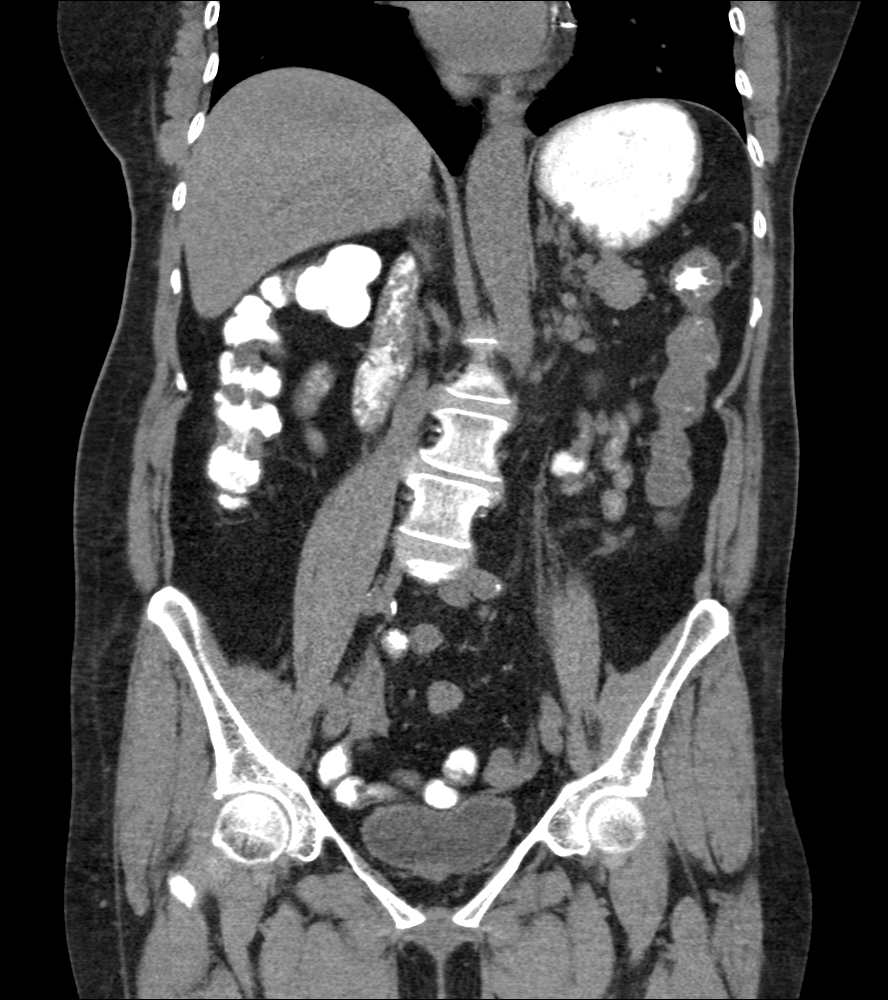
[im 78/141  soft-tissue]
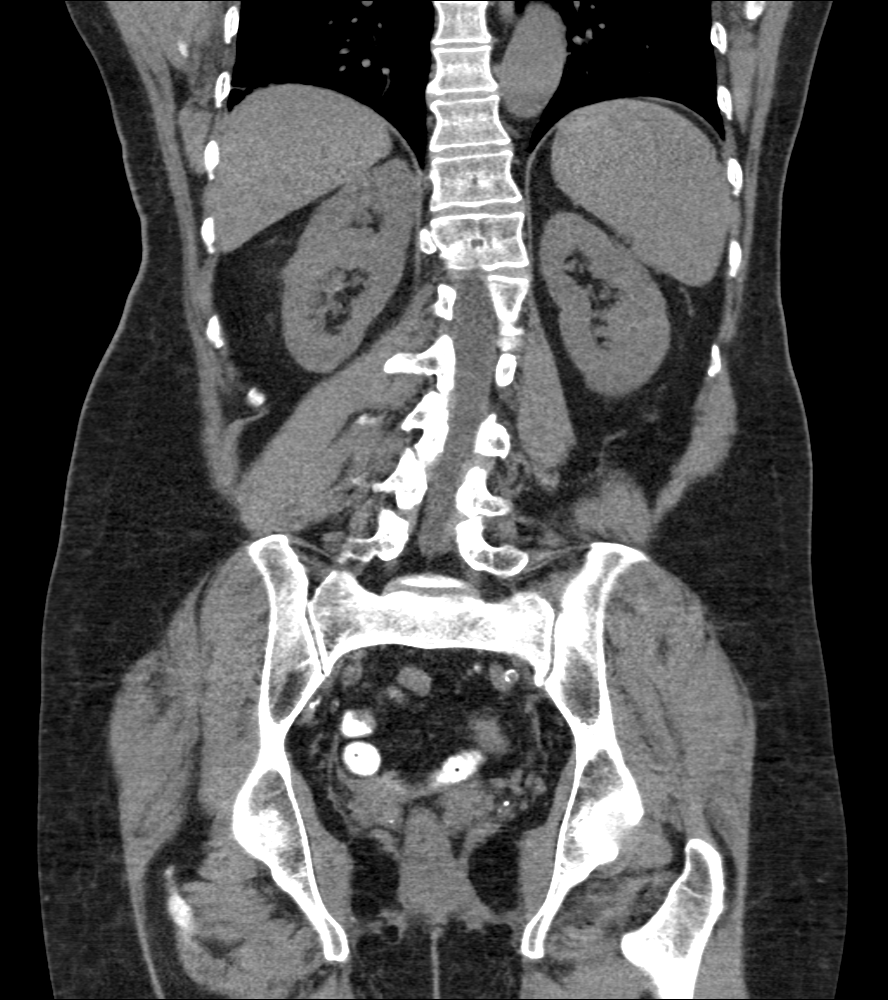

[11 of 46 positions shown; findings below may reference images not displayed]

FINDINGS: Lower chest: Mild dependent atelectasis posteriorly in the right
lower lobe. Visualized lung bases otherwise clear. Heart mildly
enlarged. Severe 3 vessel coronary atherosclerosis.

Hepatobiliary: Liver normal in size and appearance for the
unenhanced technique. Gallbladder normal in appearance without
calcified gallstones. No biliary ductal dilation.

Pancreas: Normal unenhanced appearance.

Spleen: Normal unenhanced appearance.

Adrenals/Urinary Tract: Normal appearing adrenal glands. Arterial
calcifications involving interpolar arteries in the right kidney
mimic urinary tract calculi, but these were visible on the prior CT
and are unchanged. No urinary tract calculi or obstruction.
Approximate 2 cm simple cyst arising from the upper pole of the left
kidney. Within the limits of the unenhanced technique, no solid
renal masses. Urinary bladder decompressed and unremarkable.

Stomach/Bowel: Gastric antral prolapse into the duodenal bulb.
Stomach otherwise normal in appearance for the degree of distention.
Normal-appearing small bowel. Wall thickening involving the distal
transverse colon and the proximal and mid descending colon. Distal
descending colon, sigmoid colon and rectum completely decompressed.
Cecum and ascending colon normal in appearance. Normal-appearing
short appendix in the right upper pelvis.

Vascular/Lymphatic: Moderate aortoiliofemoral atherosclerosis
without aneurysm. No pathologic lymphadenopathy.

Reproductive: Surgically absent uterus.  No adnexal masses.

Other: Moderate-sized umbilical hernia containing fat.

Musculoskeletal: Thoracolumbar scoliosis convex left. Degenerative
disc disease at L3-4 and L4-5 with likely chronic disc protrusions
as there is calcification in the posterior annular fibers at these
levels.
IMPRESSION: 1. Colitis involving the distal transverse colon and the descending
colon. This may be infectious, though the distribution can also be
seen in ischemic colitis.
2. Gastric antral prolapse into the duodenal bulb.
3. Moderate aorto-iliofemoral and visceral artery atherosclerosis
without evidence of aneurysm.
4. Mild dependent atelectasis in the visualized right lower lobe.

## 2017-03-05 ENCOUNTER — Encounter: Payer: Self-pay | Admitting: Family Medicine

## 2017-03-09 ENCOUNTER — Other Ambulatory Visit: Payer: Self-pay | Admitting: Family Medicine

## 2017-03-09 MED ORDER — HYDROCODONE-HOMATROPINE 5-1.5 MG/5ML PO SYRP: 2.5000 mL | ORAL_SOLUTION | Freq: Three times a day (TID) | ORAL | 0 refills | Status: DC | PRN

## 2017-03-28 DIAGNOSIS — R42 Dizziness and giddiness: Secondary | ICD-10-CM | POA: Diagnosis not present

## 2017-03-28 DIAGNOSIS — H9312 Tinnitus, left ear: Secondary | ICD-10-CM | POA: Diagnosis not present

## 2017-04-20 ENCOUNTER — Encounter: Payer: Self-pay | Admitting: Family Medicine

## 2017-04-21 ENCOUNTER — Other Ambulatory Visit: Payer: Self-pay | Admitting: Family Medicine

## 2017-04-21 MED ORDER — CYCLOBENZAPRINE HCL 10 MG PO TABS: 5.0000 mg | ORAL_TABLET | Freq: Every evening | ORAL | 0 refills | Status: DC | PRN

## 2017-05-07 DIAGNOSIS — J069 Acute upper respiratory infection, unspecified: Secondary | ICD-10-CM | POA: Diagnosis not present

## 2017-06-01 DIAGNOSIS — J309 Allergic rhinitis, unspecified: Secondary | ICD-10-CM | POA: Diagnosis not present

## 2017-06-01 DIAGNOSIS — R05 Cough: Secondary | ICD-10-CM | POA: Diagnosis not present

## 2017-06-08 ENCOUNTER — Other Ambulatory Visit: Payer: Self-pay | Admitting: Family Medicine

## 2017-06-08 NOTE — Telephone Encounter (Signed)
Last office visit 01/30/2017.  Last refilled 04/21/2017 for #15 with no refills.  Ok to refill?

## 2017-06-09 NOTE — Telephone Encounter (Signed)
Sent. Thanks.   

## 2017-06-14 ENCOUNTER — Ambulatory Visit: Payer: 59 | Admitting: Family Medicine

## 2017-06-14 ENCOUNTER — Encounter: Payer: Self-pay | Admitting: Family Medicine

## 2017-06-14 DIAGNOSIS — R05 Cough: Secondary | ICD-10-CM

## 2017-06-14 DIAGNOSIS — R058 Other specified cough: Secondary | ICD-10-CM

## 2017-06-14 NOTE — Patient Instructions (Signed)
Try elevating the head of your bed and avoid eating much right before bedtime.  Update me if not better.   We may need to get you over to the ENT clinic if not better.  Take care.  Glad to see you.

## 2017-06-14 NOTE — Progress Notes (Signed)
Her ear ringing is better.  I'll defer.  She agrees.    Cough.  Waking up at night.  Going on for "a while."  Had seen [facility] prev. Tried nasal ipratropium and cough medicine.  It helped a little but was only temporary relief.   Not coughing in the day.  Walking between 2 and 3 AM.  She keeps water by the bedside. Kept using humidifier and ipratropium but that isn't helping.  She tried using a fan but that didn't help.    She tried mucinex but it affected her BP.  She is already using flonase.    No fevers.  She doesn't feel sick.  She has some throat irritation.  She has tried using nasal rinse w/o relief.  No sputum.  Not SOB.  She doesn't cough immediately when she lays down- only later in the night.   No BLE edema.  She didn't have much heartburn.  Sleeping on 2 pillows at baseline.  Bed is flat o/w.   Meds, vitals, and allergies reviewed.   ROS: Per HPI unless specifically indicated in ROS section   GEN: nad, alert and oriented HEENT: mucous membranes moist, nasal exam without erythema.  Oropharynx without erythema NECK: supple w/o LA CV: rrr PULM: ctab, no inc wob ABD: soft, +bs EXT: no edema SKIN: no acute rash

## 2017-06-15 NOTE — Assessment & Plan Note (Signed)
She is not short of breath laying down.  She has no signs of failure.  Discussed with patient.  She does not have symptoms right when she lays down.  She is waking up in the middle of the night with the cough.  I do not think this is ARB related since she does not have cough during the day.  She has already been treated aggressively for allergies and postnasal drip without much relief.  I question if she could have GERD related cough.  She is already on PPI.  Discussed with patient about elevating head of bed.  Update me if not better.  Okay for outpatient follow-up.  Differential discussed with patient.  She agrees with plan.  See AVS.

## 2017-06-22 ENCOUNTER — Other Ambulatory Visit: Payer: Self-pay | Admitting: *Deleted

## 2017-06-22 ENCOUNTER — Encounter: Payer: Self-pay | Admitting: Family Medicine

## 2017-06-22 MED ORDER — DICYCLOMINE HCL 10 MG PO CAPS: ORAL_CAPSULE | ORAL | 3 refills | Status: DC

## 2017-06-24 ENCOUNTER — Encounter: Payer: Self-pay | Admitting: Family Medicine

## 2017-06-25 ENCOUNTER — Other Ambulatory Visit: Payer: Self-pay | Admitting: Family Medicine

## 2017-06-25 DIAGNOSIS — R059 Cough, unspecified: Secondary | ICD-10-CM

## 2017-06-25 DIAGNOSIS — R05 Cough: Secondary | ICD-10-CM

## 2017-06-26 ENCOUNTER — Encounter (INDEPENDENT_AMBULATORY_CARE_PROVIDER_SITE_OTHER): Payer: Self-pay

## 2017-06-27 ENCOUNTER — Encounter (INDEPENDENT_AMBULATORY_CARE_PROVIDER_SITE_OTHER): Payer: Self-pay

## 2017-06-28 ENCOUNTER — Other Ambulatory Visit: Payer: Self-pay | Admitting: Family Medicine

## 2017-06-28 MED ORDER — HYDROCODONE-HOMATROPINE 5-1.5 MG/5ML PO SYRP: 2.5000 mL | ORAL_SOLUTION | Freq: Three times a day (TID) | ORAL | 0 refills | Status: DC | PRN

## 2017-07-11 NOTE — Progress Notes (Signed)
[facility] Pulmonary Medicine Consultation      Assessment and Plan:  Intractable cough. - Postinfectious cough, most likely. -We will check chest x-ray, start on Robitussin and Tessalon 3 times daily. - Use Tussionex at night to help with sleep. -Patient is asked to call back in 6 weeks if not feeling better, at that time we will consider further work-up which may include a CT scan, CBC with differential, allergy work-up.  Meds ordered this encounter  Medications  . HYDROcodone-acetaminophen (HYCET) 7.5-325 mg/15 ml solution    Sig: Take 10 mLs by mouth at bedtime for 14 days.    Dispense:  120 mL    Refill:  0  . benzonatate (TESSALON PERLES) 100 MG capsule    Sig: Take 2 capsules (200 mg total) by mouth 3 (three) times daily.    Dispense:  90 capsule    Refill:  2   Orders Placed This Encounter  Procedures  . DG Chest 2 View   Return for Call back in 6 weeks if not feeling better. .   Date: 07/12/2017  MRN# [id number] SHREYA LACASSE Dec 30, 1955    SYRITA DOVEL is a 62 y.o. old female seen in consultation for chief complaint of:    Chief Complaint  Patient presents with  . Consult    Referred by Dr.Duncan Cheree Ditto for cough.  . Cough    x 1 month went away then came back. Pt denies sob/wheezing.    HPI:   The patient is a 62 year old female referred with symptoms of cough.  She has been tried empirically on nasal ipratropium, Flonase, Mucinex. She has tried robitussin which helped a bit but did not last. She narcotic cough syrup which gave short term relief.  The cough started as a cold about a month ago, the cold went away but the cough stayed. The cough is worse when laying down at night, and can wake her from sleep. She takes the narcotic cough syrup to help her sleep but she tries to avoid it if she has to work because it make her sleepy.     No hemoptysis, no weight loss. No reflux or heartburn. Has mild sinus drainage controlled with flonase.   **Chest  x-ray 03/01/2015>> strandy atelectasis at the right lung base, otherwise unremarkable lungs. **CBC 03/29/2012>> absolute eosinophil count 0.   PMHX:   Past Medical History:  Diagnosis Date  . Anemia    with prev unremarkable heme eval ~2011  . CAD (coronary artery disease)    Dr. Donnie Aho with Cards  . History of gastric ulcer    2005   . Hyperlipidemia   . Hypertension   . IBS (irritable bowel syndrome)   . Leukopenia    with prev unremarkable heme eval ~2011  . Migraine    Unsp w/o intract w/o status migrainosus  . Osteoporosis 11/18/2004  . Shingles   . Trigeminal neuralgia 03/05/2012   Surgical Hx:  Past Surgical History:  Procedure Laterality Date  . ABDOMINAL HYSTERECTOMY  2002  . CORONARY ARTERY BYPASS GRAFT  2002  . Stress cardiolite  09/11/2008   Probably normal with breast attenuation noted but no evidence of ischemia  . TONSILLECTOMY  ~ 38   Family Hx:  Family History  Problem Relation Age of Onset  . Arthritis Mother   . Diabetes Mother   . Hypertension Mother   . Stroke Father   . Heart disease Father        MI  . Diabetes  Father   . Hypertension Father    Social Hx:   Social History   Tobacco Use  . Smoking status: Never Smoker  . Smokeless tobacco: Never Used  Substance Use Topics  . Alcohol use: No    Alcohol/week: 0.0 oz  . Drug use: No   Medication:    Current Outpatient Medications:  .  amLODipine (NORVASC) 5 MG tablet, Take 5 mg by mouth daily., Disp: , Rfl:  .  aspirin 81 MG tablet, Take 81 mg by mouth daily.  , Disp: , Rfl:  .  atorvastatin (LIPITOR) 80 MG tablet, Take 80 mg by mouth daily., Disp: , Rfl:  .  carbamazepine (TEGRETOL) 200 MG tablet, Take 200 mg by mouth 2 (two) times daily., Disp: , Rfl:  .  [facility] LORATADINE-D 24 HOUR 10-240 MG 24 hr tablet, TAKE 1 TABLET BY MOUTH DAILY., Disp: 30 tablet, Rfl: 5 .  dexlansoprazole (DEXILANT) 60 MG capsule, Take 60 mg by mouth daily., Disp: , Rfl:  .  dicyclomine (BENTYL) 10 MG capsule, TAKE  ONE CAPSULE BY MOUTH 4 TIMES A DAY BEFORE MEALS AND AT BEDTIME, Disp: 120 capsule, Rfl: 3 .  ezetimibe (ZETIA) 10 MG tablet, Take 10 mg by mouth daily.  , Disp: , Rfl:  .  fluticasone (FLONASE) 50 MCG/ACT nasal spray, Place 2 sprays into both nostrils daily., Disp: 16 g, Rfl: 6 .  levocetirizine (XYZAL) 5 MG tablet, TAKE 1 TABLET BY MOUTH EVERY DAY, Disp: 30 tablet, Rfl: 5 .  metoprolol (LOPRESSOR) 50 MG tablet, Take 50 mg by mouth 2 (two) times daily.  , Disp: , Rfl:  .  valsartan-hydrochlorothiazide (DIOVAN-HCT) 320-25 MG per tablet, Take 1 tablet by mouth daily., Disp: , Rfl:    Allergies:  Amoxicillin-pot clavulanate; Azithromycin; Ibandronate sodium; Iohexol; Raloxifene; Sulfonamide derivatives; Tessalon [benzonatate]; and Zoledronic acid  Review of Systems: Gen:  Denies  fever, sweats, chills HEENT: Denies blurred vision, double vision. bleeds, sore throat Cvc:  No dizziness, chest pain. Resp:   Denies cough or sputum production, shortness of breath Gi: Denies swallowing difficulty, stomach pain. Gu:  Denies bladder incontinence, burning urine Ext:   No Joint pain, stiffness. Skin: No skin rash,  hives  Endoc:  No polyuria, polydipsia. Psych: No depression, insomnia. Other:  All other systems were reviewed with the patient and were negative other that what is mentioned in the HPI.   Physical Examination:   VS: BP (!) 148/82 (BP Location: Left Arm, Cuff Size: Normal)   Pulse (!) 54   Resp 16   Ht 5' 3.5" (1.613 m)   Wt 125 lb (56.7 kg)   SpO2 98%   BMI 21.80 kg/m   General Appearance: No distress  Neuro:without focal findings,  speech normal,  HEENT: PERRLA, EOM intact.   Pulmonary: normal breath sounds, No wheezing.  CardiovascularNormal S1,S2.  No m/r/g.   Abdomen: Benign, Soft, non-tender. Renal:  No costovertebral tenderness  GU:  No performed at this time. Endoc: No evident thyromegaly, no signs of acromegaly. Skin:   warm, no rashes, no ecchymosis  Extremities:  normal, no cyanosis, clubbing.  Other findings:    LABORATORY PANEL:   CBC No results for input(s): WBC, HGB, HCT, PLT in the last 168 hours. ------------------------------------------------------------------------------------------------------------------  Chemistries  No results for input(s): NA, K, CL, CO2, GLUCOSE, BUN, CREATININE, CALCIUM, MG, AST, ALT, ALKPHOS, BILITOT in the last 168 hours.  Invalid input(s): GFRCGP ------------------------------------------------------------------------------------------------------------------  Cardiac Enzymes No results for input(s): TROPONINI in the last 168 hours. ------------------------------------------------------------  RADIOLOGY:  No results found.     Thank  you for the consultation and for allowing [facility] to assist in the care of your patient. Our recommendations are noted above.  Please contact us if we can be of further service.   Wells Guiles, M.D., F.C.C.P.  Board Certified in Internal Medicine, Pulmonary Medicine, Critical Care Medicine, and Sleep Medicine.  [facility] Office Number: [phone]   07/12/2017

## 2017-07-12 ENCOUNTER — Ambulatory Visit: Payer: 59 | Admitting: Internal Medicine

## 2017-07-12 ENCOUNTER — Ambulatory Visit
Admission: RE | Admit: 2017-07-12 | Discharge: 2017-07-12 | Disposition: A | Discharge status: Home / Self Care | Payer: 59 | Source: Ambulatory Visit | Attending: Internal Medicine | Admitting: Internal Medicine

## 2017-07-12 ENCOUNTER — Encounter: Payer: Self-pay | Admitting: Internal Medicine

## 2017-07-12 VITALS — BP 148/82 | HR 54 | Resp 16 | Ht 63.5 in | Wt 125.0 lb

## 2017-07-12 DIAGNOSIS — J984 Other disorders of lung: Secondary | ICD-10-CM | POA: Diagnosis not present

## 2017-07-12 DIAGNOSIS — R059 Cough, unspecified: Secondary | ICD-10-CM

## 2017-07-12 DIAGNOSIS — J069 Acute upper respiratory infection, unspecified: Secondary | ICD-10-CM | POA: Diagnosis not present

## 2017-07-12 DIAGNOSIS — R05 Cough: Secondary | ICD-10-CM | POA: Diagnosis not present

## 2017-07-12 MED ORDER — BENZONATATE 100 MG PO CAPS: 200.0000 mg | ORAL_CAPSULE | Freq: Three times a day (TID) | ORAL | 2 refills | Status: DC

## 2017-07-12 MED ORDER — HYDROCODONE-ACETAMINOPHEN 7.5-325 MG/15ML PO SOLN: 10.0000 mL | Freq: Every day | ORAL | 0 refills | Status: AC

## 2017-07-12 NOTE — Patient Instructions (Addendum)
Robitussin-DM three times daily.  Tessalon 2 tablets three times daily.  Take your cough at night for sleep.   Will send for Chest xray.   Call back in 6 weeks if not feeling better.

## 2017-07-16 ENCOUNTER — Encounter: Payer: Self-pay | Admitting: Family Medicine

## 2017-07-17 ENCOUNTER — Encounter: Payer: Self-pay | Admitting: Family Medicine

## 2017-07-19 ENCOUNTER — Telehealth: Payer: Self-pay | Admitting: Family Medicine

## 2017-07-19 MED ORDER — TRAMADOL HCL 50 MG PO TABS: 50.0000 mg | ORAL_TABLET | Freq: Three times a day (TID) | ORAL | 0 refills | Status: DC | PRN

## 2017-07-19 NOTE — Telephone Encounter (Signed)
Left message on voicemail for patient to call back. Okay for PEC/triage nurse to relay message to patient when she calls back. 

## 2017-07-19 NOTE — Telephone Encounter (Signed)
Could not reach the pt or her sister (DPR signed) at any contact #. Left v/m requesting pt to cb. [facility] and get requested info from Dr Para Marchuncan.

## 2017-07-19 NOTE — Telephone Encounter (Signed)
Please call pt.   I checked back on her old MRI, results below.  ====================== IMPRESSION: Degenerative scoliosis convex LEFT up to 20 degrees, centered at L2.  Large foraminal and extraforaminal disc extrusion at L3-4 on the RIGHT, with osseous spurring. Significant RIGHT L3 nerve root impingement.  Similarly, large foraminal and extraforaminal disc extrusion L4-5 on the RIGHT, with a lesser degree of osseous spurring. Significant RIGHT L4 nerve root impingement.  ====================== Please triage her sx (location, severity, radiation down either/both leg) and let me know.  I can order the f/u MRI if needed, potentially without an OV.  Let me know about her pain situation and we'll go from there.  Thanks.  Crawford GivensGraham Ahaan Zobrist

## 2017-07-19 NOTE — Telephone Encounter (Signed)
If she has never had pain like this prev, then keep the OV tomorrow.  This sounds MSK and would try tramadol in the meantime.  rx sent.  Thanks.

## 2017-07-19 NOTE — Telephone Encounter (Signed)
I spoke with pt; pt said she has never had pain like this before. Pt works at [employer] and one of the doctors there unofficially saw pt 07/18/17 and did not think was kidney involved due to hurting on and off upon movement. Pt said pain stays in lt lower back just below the waist line. When moves pain level is 10, but if perfectly still pain level comes and goes at 5. Pain does not radiate down buttock or leg. There is no hx of kidney stones, no weakness on lt side, no fever, no burning or pain or frequency when urinates. [facility] [address] Pt request cb after Dr Para Marchuncan reviews.

## 2017-07-20 ENCOUNTER — Encounter: Payer: Self-pay | Admitting: Family Medicine

## 2017-07-20 ENCOUNTER — Ambulatory Visit (INDEPENDENT_AMBULATORY_CARE_PROVIDER_SITE_OTHER)
Admission: RE | Admit: 2017-07-20 | Discharge: 2017-07-20 | Disposition: A | Discharge status: Home / Self Care | Payer: 59 | Source: Ambulatory Visit | Attending: Family Medicine | Admitting: Family Medicine

## 2017-07-20 ENCOUNTER — Ambulatory Visit: Payer: 59 | Admitting: Family Medicine

## 2017-07-20 VITALS — BP 142/84 | HR 59 | Temp 98.0°F | Ht 63.5 in | Wt 126.5 lb

## 2017-07-20 DIAGNOSIS — M545 Low back pain: Secondary | ICD-10-CM | POA: Diagnosis not present

## 2017-07-20 MED ORDER — PREDNISONE 20 MG PO TABS
ORAL_TABLET | ORAL | 0 refills | Status: DC
Start: 2017-07-20 — End: 2017-10-10

## 2017-07-20 MED ORDER — BENZONATATE 100 MG PO CAPS: 200.0000 mg | ORAL_CAPSULE | Freq: Every day | ORAL | Status: DC

## 2017-07-20 NOTE — Patient Instructions (Signed)
Stop the BCs for now.   Change to prednisone and use the tramadol if needed.  Update me if not better with that.   We may need to get the MRI done (or get you set up with PT) if not any better.   Take care.  Glad to see you.

## 2017-07-20 NOTE — Telephone Encounter (Signed)
Spoke with patient this morning who apparently did not receive the message that Tramadol had been sent in.  Patient says this pain is different from previously and will come in for appointment today.

## 2017-07-20 NOTE — Progress Notes (Signed)
She recently retired and feels good about that.  D/w pt.   Her cough is much better in the meantime.  D/w pt.  She hasn't had to use hydrocodone cough syrup recently.    Back pain.  Started about almost 2 weeks ago.  Some backache, like her "back was tired." then the pain got worse, no trauma.  L lower back pain.  No rash.  No radicular pain down the leg.  No FCNAVD.  No dysuria.  This isn't similar to prev back pain with R sciatica a few years ago.    She isn't on tramadol yet, she hasn't picked up rx yet.  She took a BC powder yesterday and that helped a little.  She has positional changes that can make it better or worse.  ============================== MRI prev with degenerative scoliosis convex LEFT up to 20 degrees, centered at L2.  Large foraminal and extraforaminal disc extrusion at L3-4 on the RIGHT, with osseous spurring. Significant RIGHT L3 nerve root impingement.  Similarly, large foraminal and extraforaminal disc extrusion L4-5 on the RIGHT, with a lesser degree of osseous spurring. Significant RIGHT L4 nerve root impingement. ============================== Her sx now are LEFT, not RIGHT.    PMH and SH reviewed  ROS: Per HPI unless specifically indicated in ROS section   Meds, vitals, and allergies reviewed.   GEN: nad, alert and oriented NECK: supple w/o LA CV: rrr. PULM: ctab, no inc wob ABD: soft, +bs EXT: no edema SKIN: no acute rash Back not ttp in midline.  L lower back isn't ttp, no cva pain.  L SLR neg.  S/S grossly wnl BLE She has L lower back pain with position change in chair.

## 2017-07-23 DIAGNOSIS — M545 Low back pain, unspecified: Secondary | ICD-10-CM | POA: Insufficient documentation

## 2017-07-23 NOTE — Assessment & Plan Note (Addendum)
Her sx now are LEFT, not RIGHT.  D/w pt about options.  Check plain films today. See notes on imaging. Stop BCs for now.   Change to prednisone and use the tramadol if needed.  Routine medication cautions given, especially regarding prednisone and tramadol. Update me if not better with that combination. We may need to get the MRI done (or get her set up with PT) if not any better.   She agrees.  No weakness, still okay for outpatient follow-up.

## 2017-07-25 DIAGNOSIS — K219 Gastro-esophageal reflux disease without esophagitis: Secondary | ICD-10-CM | POA: Diagnosis not present

## 2017-07-25 DIAGNOSIS — K5730 Diverticulosis of large intestine without perforation or abscess without bleeding: Secondary | ICD-10-CM | POA: Diagnosis not present

## 2017-07-25 DIAGNOSIS — K580 Irritable bowel syndrome with diarrhea: Secondary | ICD-10-CM | POA: Diagnosis not present

## 2017-07-26 ENCOUNTER — Encounter: Payer: Self-pay | Admitting: Family Medicine

## 2017-07-26 LAB — LAB REPORT - SCANNED
ALT: 23
AST: 23
Alkaline Phosphatase: 120
Creatinine, Ser: 1.08
HCT: 33
Hemoglobin: 11.1
Potassium: 3.8
TSH: 2.85
WBC: 4.2

## 2017-07-30 ENCOUNTER — Telehealth: Payer: Self-pay | Admitting: Family Medicine

## 2017-07-30 NOTE — Telephone Encounter (Signed)
Please check with patient about her pain situation.  See mychart message.  Tell her I've been out of clinic with an illness.   Please check to see if Dr Loreta AveMann had sent recent labs.  Thanks.

## 2017-07-31 NOTE — Telephone Encounter (Signed)
Pt returned call, she said that Dr Loreta AveMann did do labs last Tuesday. Dr Kenna GilbertMann's number is [phone]  The best number for pt is [phone]

## 2017-07-31 NOTE — Telephone Encounter (Signed)
Left detailed message on voicemail.  Faxed Dr. Loreta AveMann for most recent labs.

## 2017-08-01 NOTE — Telephone Encounter (Signed)
Faxed for labs and they arrived and placed in Dr. Lianne Bushyuncan's In Box.

## 2017-08-06 ENCOUNTER — Telehealth: Payer: Self-pay | Admitting: Family Medicine

## 2017-08-06 NOTE — Telephone Encounter (Signed)
Notify patient.  Labs are reviewed.  Hemoglobin is similar to previous.  She has a baseline mild anemia over the years and this appears to be similar compared to previous.    Her creatinine is not significantly different than previous.  It appears to be unremarkable to me.  Her recent value is 1.08.  Her previous baseline appears to be around 1.    She has a minimal increase in her alkaline phosphatase.  I will defer that to Dr. Loreta AveMann with GI.  Please route this note over to Dr. Loreta AveMann after contacting the patient.  Thanks.

## 2017-08-07 NOTE — Telephone Encounter (Signed)
Left detailed message on voicemail.  

## 2017-08-08 ENCOUNTER — Encounter: Payer: Self-pay | Admitting: Family Medicine

## 2017-08-15 DIAGNOSIS — J019 Acute sinusitis, unspecified: Secondary | ICD-10-CM | POA: Diagnosis not present

## 2017-10-10 ENCOUNTER — Encounter: Payer: Self-pay | Admitting: Family Medicine

## 2017-10-10 ENCOUNTER — Ambulatory Visit: Payer: 59 | Admitting: Family Medicine

## 2017-10-10 DIAGNOSIS — J069 Acute upper respiratory infection, unspecified: Secondary | ICD-10-CM

## 2017-10-10 MED ORDER — PREDNISONE 10 MG PO TABS: 10.0000 mg | ORAL_TABLET | Freq: Every day | ORAL | 0 refills | Status: DC

## 2017-10-10 MED ORDER — CEFPODOXIME PROXETIL 200 MG PO TABS: 200.0000 mg | ORAL_TABLET | Freq: Two times a day (BID) | ORAL | 0 refills | Status: DC

## 2017-10-10 NOTE — Patient Instructions (Signed)
Start the antibiotics- take that twice a daily.  Prednisone daily with food.  Rest and fluids.  Continue sinus rinse.  Update [place] as needed.  Take care.  Glad to see you.

## 2017-10-10 NOTE — Progress Notes (Signed)
Her back pain is better in the meantime.    Sx started about a few weeks ago.  He has had HA for a few weeks.  R frontal HA, constant.  Nauseated from HA.  Not sleeping well.  She sat up most of the night last night since laying flat makes it worse.  No fevers.    She tried taking tylenol cold medicine, goody's prn.  Goody's helped a little, for about 1 hour.    Minimal cough, no sputum.  No vomiting but some gagging.  Last night with more R max sinus pressure.  No ear pain.  No ST.    Getting a dark room didn't help.  H/o migraines in the distant past but this didn't feel similar.    She has been using nasal rinse.  No vision changes.    Hot showers help with R facial pain.    Meds, vitals, and allergies reviewed.  ROS: Per HPI unless specifically indicated in ROS section   GEN: nad, alert and oriented HEENT: mucous membranes moist, tm w/o erythema for R SOM noted, nasal exam w/o erythema, clear discharge noted,  OP with cobblestoning NECK: supple w/o LA CV: rrr.   PULM: ctab, no inc wob EXT: no edema SKIN: well perfused.

## 2017-10-11 NOTE — Assessment & Plan Note (Signed)
Presumed sinusitis.  Discussed with patient about options.  Would try prednisone with routine steroid cautions given.  She should be able to tolerate third-generation cephalosporin.  Prescription sent with routine cautions.  Still okay for outpatient follow-up.  Supportive care otherwise.  Update me as needed.  She agrees.

## 2017-10-13 ENCOUNTER — Encounter: Payer: Self-pay | Admitting: Family Medicine

## 2017-10-14 DIAGNOSIS — G4452 New daily persistent headache (NDPH): Secondary | ICD-10-CM | POA: Diagnosis not present

## 2017-10-15 ENCOUNTER — Encounter: Payer: Self-pay | Admitting: Family Medicine

## 2017-10-16 ENCOUNTER — Telehealth: Payer: Self-pay | Admitting: Family Medicine

## 2017-10-16 ENCOUNTER — Other Ambulatory Visit: Payer: Self-pay | Admitting: *Deleted

## 2017-10-16 DIAGNOSIS — R519 Headache, unspecified: Secondary | ICD-10-CM

## 2017-10-16 DIAGNOSIS — R51 Headache: Principal | ICD-10-CM

## 2017-10-16 MED ORDER — TRAMADOL HCL 50 MG PO TABS: 50.0000 mg | ORAL_TABLET | Freq: Three times a day (TID) | ORAL | 0 refills | Status: DC | PRN

## 2017-10-16 NOTE — Telephone Encounter (Signed)
See mychart message.  Please triage patient re: HA and update me.  Thanks.

## 2017-10-16 NOTE — Telephone Encounter (Signed)
I'm still under the assumption that this was URI related and it still makes sense to use the prednisone and abx.  If still with pain would use warm compresses and start tramadol.  If not better with that then needs recheck.  I wish her and her mother the best.  rx sent.  Thanks.

## 2017-10-16 NOTE — Telephone Encounter (Signed)
I spoke with pt;pt seen at [facility] walk in on 10/14/17 and received a toradol shot which did not get rid of h/a completely; did lessen the pain for awhile. Pt thinks the [facility] walk in is going to send note to Dr Para March.The h/a was back on 10/15/17. H/A is still rt frontal h/a that is constant. Pt vomited x 1 on 10/15/17 but still feels nauseated. No fever, no dizziness and no blurred vision or vision changes. Slight facial pain on rt side of face. Pt took excedrin extra strength and  Two sinus congestion and pain relief generic tabs last night and did sleep for approximately 6 hours. Pt is continuing abx and prednisone. Pt also doing sinus rinse. Pt needs to be at hospital on 10/17/17 with her mother who is having knee surgery. Pt request what else to do to get rid of h/a. [facility] [address] Pt request cb.

## 2017-10-16 NOTE — Addendum Note (Signed)
Addended by: Joaquim Nam on: 10/16/2017 09:53 AM   Modules accepted: Orders

## 2017-10-16 NOTE — Telephone Encounter (Signed)
Patient advised.

## 2017-10-18 ENCOUNTER — Other Ambulatory Visit: Payer: 59

## 2017-10-24 ENCOUNTER — Other Ambulatory Visit: Payer: Self-pay | Admitting: Family Medicine

## 2017-10-25 ENCOUNTER — Other Ambulatory Visit: Payer: 59

## 2017-10-29 ENCOUNTER — Other Ambulatory Visit: Payer: Self-pay | Admitting: Family Medicine

## 2017-11-14 DIAGNOSIS — M545 Low back pain: Secondary | ICD-10-CM | POA: Diagnosis not present

## 2017-12-14 ENCOUNTER — Encounter: Payer: Self-pay | Admitting: Family Medicine

## 2017-12-14 ENCOUNTER — Telehealth: Payer: Self-pay | Admitting: *Deleted

## 2017-12-14 ENCOUNTER — Other Ambulatory Visit: Payer: Self-pay | Admitting: *Deleted

## 2017-12-14 ENCOUNTER — Ambulatory Visit (INDEPENDENT_AMBULATORY_CARE_PROVIDER_SITE_OTHER): Payer: 59 | Admitting: Family Medicine

## 2017-12-14 VITALS — BP 182/88 | HR 58 | Temp 98.6°F | Ht 63.5 in | Wt 134.5 lb

## 2017-12-14 DIAGNOSIS — Z114 Encounter for screening for human immunodeficiency virus [HIV]: Secondary | ICD-10-CM

## 2017-12-14 DIAGNOSIS — Z7189 Other specified counseling: Secondary | ICD-10-CM

## 2017-12-14 DIAGNOSIS — I10 Essential (primary) hypertension: Secondary | ICD-10-CM | POA: Diagnosis not present

## 2017-12-14 DIAGNOSIS — M810 Age-related osteoporosis without current pathological fracture: Secondary | ICD-10-CM

## 2017-12-14 DIAGNOSIS — H9319 Tinnitus, unspecified ear: Secondary | ICD-10-CM

## 2017-12-14 DIAGNOSIS — Z0000 Encounter for general adult medical examination without abnormal findings: Secondary | ICD-10-CM | POA: Diagnosis not present

## 2017-12-14 DIAGNOSIS — Z1159 Encounter for screening for other viral diseases: Secondary | ICD-10-CM

## 2017-12-14 DIAGNOSIS — K589 Irritable bowel syndrome without diarrhea: Secondary | ICD-10-CM

## 2017-12-14 DIAGNOSIS — E785 Hyperlipidemia, unspecified: Secondary | ICD-10-CM

## 2017-12-14 DIAGNOSIS — I25810 Atherosclerosis of coronary artery bypass graft(s) without angina pectoris: Secondary | ICD-10-CM

## 2017-12-14 LAB — CBC WITH DIFFERENTIAL/PLATELET
Basophils Absolute: 0 10*3/uL (ref 0.0–0.1)
Basophils Relative: 0.6 % (ref 0.0–3.0)
Eosinophils Absolute: 0 10*3/uL (ref 0.0–0.7)
Eosinophils Relative: 1.2 % (ref 0.0–5.0)
HCT: 33.6 % — ABNORMAL LOW (ref 36.0–46.0)
Hemoglobin: 11.6 g/dL — ABNORMAL LOW (ref 12.0–15.0)
Lymphocytes Relative: 47.7 % — ABNORMAL HIGH (ref 12.0–46.0)
Lymphs Abs: 1.3 10*3/uL (ref 0.7–4.0)
MCHC: 34.6 g/dL (ref 30.0–36.0)
MCV: 92.3 fl (ref 78.0–100.0)
Monocytes Absolute: 0.3 10*3/uL (ref 0.1–1.0)
Monocytes Relative: 11.5 % (ref 3.0–12.0)
Neutro Abs: 1.1 10*3/uL — ABNORMAL LOW (ref 1.4–7.7)
Neutrophils Relative %: 39 % — ABNORMAL LOW (ref 43.0–77.0)
Platelets: 147 10*3/uL — ABNORMAL LOW (ref 150.0–400.0)
RBC: 3.64 Mil/uL — ABNORMAL LOW (ref 3.87–5.11)
RDW: 13.8 % (ref 11.5–15.5)
WBC: 2.7 10*3/uL — ABNORMAL LOW (ref 4.0–10.5)

## 2017-12-14 LAB — LIPID PANEL
Cholesterol: 185 mg/dL (ref 0–200)
HDL: 81.6 mg/dL (ref >39.00)
LDL Cholesterol: 86 mg/dL (ref 0–99)
NonHDL: 102.92
Total CHOL/HDL Ratio: 2
Triglycerides: 86 mg/dL (ref 0.0–149.0)
VLDL: 17.2 mg/dL (ref 0.0–40.0)

## 2017-12-14 LAB — COMPREHENSIVE METABOLIC PANEL
ALT: 28 U/L (ref 0–35)
AST: 24 U/L (ref 0–37)
Albumin: 4.3 g/dL (ref 3.5–5.2)
Alkaline Phosphatase: 59 U/L (ref 39–117)
BUN: 25 mg/dL — ABNORMAL HIGH (ref 6–23)
CO2: 29 mEq/L (ref 19–32)
Calcium: 9.2 mg/dL (ref 8.4–10.5)
Chloride: 103 mEq/L (ref 96–112)
Creatinine, Ser: 1.15 mg/dL (ref 0.40–1.20)
GFR: 61.06 mL/min (ref >60.00)
Glucose, Bld: 94 mg/dL (ref 70–99)
Potassium: 4.4 mEq/L (ref 3.5–5.1)
Sodium: 139 mEq/L (ref 135–145)
Total Bilirubin: 0.3 mg/dL (ref 0.2–1.2)
Total Protein: 6.7 g/dL (ref 6.0–8.3)

## 2017-12-14 LAB — VITAMIN D 25 HYDROXY (VIT D DEFICIENCY, FRACTURES): VITD: 25.67 ng/mL — ABNORMAL LOW (ref 30.00–100.00)

## 2017-12-14 MED ORDER — AMLODIPINE BESYLATE 5 MG PO TABS: 10.0000 mg | ORAL_TABLET | Freq: Every day | ORAL | Status: DC

## 2017-12-14 MED ORDER — VITAMIN D 1000 UNITS PO TABS: ORAL_TABLET | ORAL | Status: DC

## 2017-12-14 MED ORDER — AMLODIPINE BESYLATE 5 MG PO TABS: 5.0000 mg | ORAL_TABLET | Freq: Every day | ORAL | 3 refills | Status: DC

## 2017-12-14 NOTE — Patient Instructions (Addendum)
Check with your insurance to see if they will cover the shingrix shot. Please call Dr. Loreta AveMann about when you need a follow up colonoscopy.   Try vitamin D occasionally.  See what you can tolerate.   Go to the lab on the way out.  We'll contact you with your lab report. Increase norvasc/amlodipine to 10mg  a day.   Let me know about your BP next week.  Take care.  Glad to see you.

## 2017-12-14 NOTE — Telephone Encounter (Signed)
Spoke to pt who states she was seen on today and advised to take amlodipine, which she believed she was already on. After speaking with the pharmacist, pt has been taking diovan. Pt is wanting to know if she is to take both amlodipine and diovan, and if so, she is needing a new Rx of amlodipine [facility] [address]

## 2017-12-14 NOTE — Telephone Encounter (Signed)
Continue diovan. If she wasn't on amlodipine, then start 5mg  a day.   If she was already on 5mg  amlodipine, go up to 10mg  a day.  Either way, update me on her BP in about 1 week.   If she was already on 10mg  amlodpine, then continue as is and let me know to make other changes. Thanks.

## 2017-12-14 NOTE — Telephone Encounter (Signed)
Patient advised. Patient has not been on Amlodipine in a long time according to her pharmacist.  Rx for Amlodipine 5 mg was sent to pharmacy.

## 2017-12-14 NOTE — Progress Notes (Signed)
CPE- See plan.  Routine anticipatory guidance given to patient.  See health maintenance.  The possibility exists that previously documented standard health maintenance information may have been brought forward from a previous encounter into this note.  If needed, that same information has been updated to reflect the current situation based on today's encounter.    Tetanus 2010 Flu done at work, 10/2017 PNA 2012 Shingles out of stock.   Mammogram done per outside clinic.   Pap d/w pt.  Not due with h/o hysterectomy.   DXA- per outside clinic.  D/w pt about trying vit D a few times a week.   Colonoscopy per GI clinic.  See avs.   Living will d/w pt.  Would have her sister Lura Ematsy and her son Casimiro NeedleMichael equally designated if patient were incapacitated.    HIV and HCV screening d/w pt.  She opts in.    Hypertension:    Using medication without problems or lightheadedness: yes Chest pain with exertion:no Edema:no Short of breath:no Average home BPs: BP elevated on outside checks  Elevated Cholesterol: Using medications without problems: yes Muscle aches: no Diet compliance: "I try" Exercise:"I try"  IBS.  Better with bentyl for cramping.  No ADE on med.  Compliant.    Ear clicking is some better on carbamazepine.    She is going to f/u with cardiology in the near future.  D/w pt.    She is happy about her retirement.    PMH and SH reviewed  Meds, vitals, and allergies reviewed.   ROS: Per HPI.  Unless specifically indicated otherwise in HPI, the patient denies:  General: fever. Eyes: acute vision changes ENT: sore throat Cardiovascular: chest pain Respiratory: SOB GI: vomiting GU: dysuria Musculoskeletal: acute back pain Derm: acute rash Neuro: acute motor dysfunction Psych: worsening mood Endocrine: polydipsia Heme: bleeding Allergy: hayfever  GEN: nad, alert and oriented HEENT: mucous membranes moist NECK: supple w/o LA CV: rrr. PULM: ctab, no inc wob ABD: soft,  +bs EXT: no edema SKIN: no acute rash

## 2017-12-15 LAB — HIV ANTIBODY (ROUTINE TESTING W REFLEX): HIV 1&2 Ab, 4th Generation: NONREACTIVE

## 2017-12-15 LAB — HEPATITIS C ANTIBODY
Hepatitis C Ab: NONREACTIVE
SIGNAL TO CUT-OFF: 0.01

## 2017-12-19 ENCOUNTER — Encounter: Payer: Self-pay | Admitting: Family Medicine

## 2017-12-19 DIAGNOSIS — Z7189 Other specified counseling: Secondary | ICD-10-CM | POA: Insufficient documentation

## 2017-12-19 DIAGNOSIS — H9319 Tinnitus, unspecified ear: Secondary | ICD-10-CM | POA: Insufficient documentation

## 2017-12-19 NOTE — Assessment & Plan Note (Signed)
See notes on labs Increase norvasc/amlodipine to 10mg  a day.   She will let me know about her BP next week.  She agrees with plan.  Routine cautions given.

## 2017-12-19 NOTE — Assessment & Plan Note (Signed)
Improved with Dexilant and Bentyl.  Continue as is.  She agrees.  Update me as needed.

## 2017-12-19 NOTE — Assessment & Plan Note (Signed)
Encourage diet and exercise.  No change in meds.  See notes on labs.

## 2017-12-19 NOTE — Assessment & Plan Note (Signed)
Living will d/w pt.  Would have her sister Lura Em and her son Casimiro Needle equally designated if patient were incapacitated.

## 2017-12-19 NOTE — Assessment & Plan Note (Signed)
Tetanus 2010 Flu done at work, 10/2017 PNA 2012 Shingles out of stock.   Mammogram done per outside clinic.   Pap d/w pt.  Not due with h/o hysterectomy.   DXA- per outside clinic.  D/w pt about trying vit D a few times a week.   Colonoscopy per GI clinic.  See avs.   Living will d/w pt.  Would have her sister Karen Saunders and her son Karen Saunders equally designated if patient were incapacitated.    HIV and HCV screening d/w pt.  She opts in.

## 2017-12-19 NOTE — Assessment & Plan Note (Signed)
She will follow-up with cardiology.  I will defer.  She agrees.  No chest pain.

## 2017-12-19 NOTE — Assessment & Plan Note (Signed)
Some better with carbamazepine per outside clinic.  I will defer.  She agrees.

## 2017-12-20 ENCOUNTER — Other Ambulatory Visit: Payer: Self-pay | Admitting: Family Medicine

## 2017-12-20 MED ORDER — PREDNISONE 10 MG PO TABS: ORAL_TABLET | ORAL | 0 refills | Status: DC

## 2017-12-22 ENCOUNTER — Other Ambulatory Visit: Payer: Self-pay | Admitting: Family Medicine

## 2017-12-27 ENCOUNTER — Other Ambulatory Visit: Payer: Self-pay | Admitting: Family Medicine

## 2017-12-27 DIAGNOSIS — I10 Essential (primary) hypertension: Secondary | ICD-10-CM

## 2017-12-27 MED ORDER — SPIRONOLACTONE 25 MG PO TABS: 12.5000 mg | ORAL_TABLET | Freq: Every day | ORAL | 3 refills | Status: DC

## 2018-01-01 ENCOUNTER — Other Ambulatory Visit: Payer: Self-pay | Admitting: Family Medicine

## 2018-01-01 MED ORDER — BENZONATATE 200 MG PO CAPS: 200.0000 mg | ORAL_CAPSULE | Freq: Three times a day (TID) | ORAL | 1 refills | Status: DC | PRN

## 2018-01-09 ENCOUNTER — Telehealth: Payer: Self-pay | Admitting: Cardiology

## 2018-01-11 ENCOUNTER — Telehealth: Payer: Self-pay | Admitting: *Deleted

## 2018-01-11 MED ORDER — TELMISARTAN-HCTZ 40-12.5 MG PO TABS: 1.0000 | ORAL_TABLET | Freq: Every day | ORAL | 0 refills | Status: DC

## 2018-01-11 NOTE — Telephone Encounter (Signed)
error 

## 2018-01-11 NOTE — Telephone Encounter (Signed)
Patient requested refill for valsartan-hydrochlorothiazide. Since this medication is on backorder, Dr. Dulce Sellar advised telmisartan-hydrochlorothiazide 40-12.5 mg be sent in to replace it. [facility] [place]   Patient is due for follow up this month. Will have front desk call patient to schedule an appointment.

## 2018-01-13 DIAGNOSIS — J209 Acute bronchitis, unspecified: Secondary | ICD-10-CM | POA: Diagnosis not present

## 2018-01-16 ENCOUNTER — Encounter: Payer: Self-pay | Admitting: Family Medicine

## 2018-01-20 DIAGNOSIS — M5431 Sciatica, right side: Secondary | ICD-10-CM | POA: Diagnosis not present

## 2018-01-22 ENCOUNTER — Other Ambulatory Visit: Payer: Self-pay | Admitting: Family Medicine

## 2018-01-22 DIAGNOSIS — M79604 Pain in right leg: Secondary | ICD-10-CM

## 2018-01-23 ENCOUNTER — Telehealth: Payer: Self-pay | Admitting: Cardiology

## 2018-01-23 MED ORDER — VALSARTAN-HYDROCHLOROTHIAZIDE 320-25 MG PO TABS: 1.0000 | ORAL_TABLET | Freq: Every day | ORAL | 0 refills | Status: DC

## 2018-01-23 NOTE — Telephone Encounter (Signed)
Informed medication was refilled and sent to preferred pharmacy.

## 2018-01-23 NOTE — Telephone Encounter (Signed)
Has been given to Dr. Krasowski for him to review  

## 2018-01-23 NOTE — Telephone Encounter (Signed)
New Message    *STAT* If patient is at the pharmacy, call can be transferred to refill team.   1. Which medications need to be refilled? (please list name of each medication and dose if known) valsartan-hydrochlorothiazide 320-25MG    2. Which pharmacy/location (including street and city if local pharmacy) is medication to be sent to? [facility] [place]  [phone]  3. Do they need a 30 day or 90 day supply? 30   The [facility]. Patient does not want to change what she was taking so is requesting that the valsartan-hydrochlorothiazide be sent to [facility]. She has checked with them and its available.

## 2018-01-23 NOTE — Addendum Note (Signed)
Addended by: Lita Mains on: 01/23/2018 03:18 PM   Modules accepted: Orders

## 2018-01-23 NOTE — Telephone Encounter (Signed)
Medication refilled, left message informing patient of this.

## 2018-01-24 DIAGNOSIS — M5431 Sciatica, right side: Secondary | ICD-10-CM | POA: Diagnosis not present

## 2018-01-30 ENCOUNTER — Encounter: Payer: Self-pay | Admitting: Cardiology

## 2018-01-30 ENCOUNTER — Ambulatory Visit (INDEPENDENT_AMBULATORY_CARE_PROVIDER_SITE_OTHER): Payer: 59 | Admitting: Cardiology

## 2018-01-30 VITALS — BP 162/78 | HR 48 | Ht 63.5 in | Wt 136.0 lb

## 2018-01-30 DIAGNOSIS — Z951 Presence of aortocoronary bypass graft: Secondary | ICD-10-CM

## 2018-01-30 DIAGNOSIS — I2510 Atherosclerotic heart disease of native coronary artery without angina pectoris: Secondary | ICD-10-CM | POA: Diagnosis not present

## 2018-01-30 DIAGNOSIS — I10 Essential (primary) hypertension: Secondary | ICD-10-CM | POA: Insufficient documentation

## 2018-01-30 DIAGNOSIS — E782 Mixed hyperlipidemia: Secondary | ICD-10-CM | POA: Diagnosis not present

## 2018-01-30 MED ORDER — SPIRONOLACTONE 25 MG PO TABS: 25.0000 mg | ORAL_TABLET | Freq: Every day | ORAL | 1 refills | Status: DC

## 2018-01-30 MED ORDER — AMLODIPINE BESYLATE 10 MG PO TABS: 5.0000 mg | ORAL_TABLET | Freq: Every day | ORAL | 1 refills | Status: DC

## 2018-01-30 NOTE — Patient Instructions (Signed)
Medication Instructions:   Your physician has recommended you make the following change in your medication:   INCREASE: Amlodipine to now 10 mg once daily. Also Spirolactone to now 25 mg daily.  If you need a refill on your cardiac medications before your next appointment, please call your pharmacy.   Lab work:  Your physician recommends that you return for lab work today: BMP and return in 2 weeks for another BMP.   If you have labs (blood work) drawn today and your tests are completely normal, you will receive your results only by: Marland Kitchen MyChart Message (if you have MyChart) OR . A paper copy in the mail If you have any lab test that is abnormal or we need to change your treatment, we will call you to review the results.  Testing/Procedures:  NONE  Follow-Up: At [facility], you and your health needs are our priority.  As part of our continuing mission to provide you with exceptional heart care, we have created designated Provider Care Teams.  These Care Teams include your primary Cardiologist (physician) and Advanced Practice Providers (APPs -  Physician Assistants and Nurse Practitioners) who all work together to provide you with the care you need, when you need it.   You will need a follow up appointment in 6 months.   Any Other Special Instructions Will Be Listed Below (If Applicable).   Please keep log of your Blood pressure and pulse and fax these readings to [phone]

## 2018-01-30 NOTE — Progress Notes (Deleted)
Cardiology Office Note:    Date:  01/30/2018   ID:  Karen Saunders, DOB 07/11/1955, MRN [id number]  PCP:  Joaquim Namuncan, Graham S, MD  Cardiologist:  Garwin Brothersajan R Revankar, MD   Referring MD: Joaquim Namuncan, Graham S, MD    ASSESSMENT:    1. Atherosclerosis of native coronary artery of native heart without angina pectoris   2. Mixed hyperlipidemia   3. Essential hypertension    PLAN:    In order of problems listed above:  1. ***   Medication Adjustments/Labs and Tests Ordered: Current medicines are reviewed at length with the patient today.  Concerns regarding medicines are outlined above.  No orders of the defined types were placed in this encounter.  No orders of the defined types were placed in this encounter.    No chief complaint on file.    History of Present Illness:    Karen GoslingBetty L Saunders is a 63 y.o. female ***  Past Medical History:  Diagnosis Date  . Anemia    with prev unremarkable heme eval ~2011  . CAD (coronary artery disease)    Dr. Donnie Ahoilley with Cards  . History of gastric ulcer    2005   . Hyperlipidemia   . Hypertension   . IBS (irritable bowel syndrome)   . Leukopenia    with prev unremarkable heme eval ~2011  . Migraine    Unsp w/o intract w/o status migrainosus  . Osteoporosis 11/18/2004  . Shingles   . Trigeminal neuralgia 03/05/2012    Past Surgical History:  Procedure Laterality Date  . ABDOMINAL HYSTERECTOMY  2002  . CORONARY ARTERY BYPASS GRAFT  2002  . LUMBAR SPINE SURGERY    . Stress cardiolite  09/11/2008   Probably normal with breast attenuation noted but no evidence of ischemia  . TONSILLECTOMY  ~ 1965    Current Medications: Current Meds  Medication Sig  . amLODipine (NORVASC) 5 MG tablet Take 1-2 tablets (5-10 mg total) by mouth daily.  Marland Kitchen. aspirin 81 MG tablet Take 81 mg by mouth daily.    Marland Kitchen. atorvastatin (LIPITOR) 80 MG tablet Take 80 mg by mouth daily.  . carbamazepine (TEGRETOL) 200 MG tablet Take 200 mg by mouth 2 (two) times daily.  .  cholecalciferol (VITAMIN D) 1000 units tablet Take 1 tab a day as tolerated, goal is a few doses per week  . [facility] LORATADINE-D 24 HOUR 10-240 MG 24 hr tablet TAKE 1 TABLET BY MOUTH DAILY.  Marland Kitchen. dexlansoprazole (DEXILANT) 60 MG capsule Take 60 mg by mouth daily.  Marland Kitchen. dicyclomine (BENTYL) 10 MG capsule TAKE ONE CAPSULE BY MOUTH 4 TIMES A DAY BEFORE MEALS AND AT BEDTIME  . ezetimibe (ZETIA) 10 MG tablet Take 10 mg by mouth daily.    Marland Kitchen. levocetirizine (XYZAL) 5 MG tablet TAKE 1 TABLET BY MOUTH EVERY DAY  . metoprolol (LOPRESSOR) 50 MG tablet Take 50 mg by mouth 2 (two) times daily.    . predniSONE (DELTASONE) 10 MG tablet Take 2 a day for 5 days, then 1 a day for 5 days, with food. Don't take with aleve/ibuprofen.  . spironolactone (ALDACTONE) 25 MG tablet Take 12.5 mg by mouth daily.  . valsartan-hydrochlorothiazide (DIOVAN-HCT) 320-25 MG tablet Take 1 tablet by mouth daily.     Allergies:   Amoxicillin-pot clavulanate; Azithromycin; Doxycycline; Ibandronate sodium; Iohexol; Raloxifene; Sulfonamide derivatives; and Zoledronic acid   Social History   Socioeconomic History  . Marital status: Divorced    Spouse name: Not on file  . Number  of children: 1  . Years of education: Not on file  . Highest education level: Not on file  Occupational History  . Occupation: MEDICAID SPEC    [place] [employer]  . 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 Financial resource strain: Not on file  . Food insecurity:    Worry: Not on file    Inability: Not on file  . Transportation needs:    Medical: Not on file    Non-medical: Not on file  Tobacco Use  . Smoking status: Never Smoker  . Smokeless tobacco: Never Used  Substance and Sexual Activity  . Alcohol use: No    Alcohol/week: 0.0 standard drinks  . Drug use: No  . Sexual activity: Not on file  Lifestyle  . Physical activity:    Days per week: Not on file    Minutes per session: Not on file  .  Stress: Not on file  Relationships  . Social connections:    Talks on phone: Not on file    Gets together: Not on file    Attends religious service: Not on file    Active member of club or organization: Not on file    Attends meetings of clubs or organizations: Not on file    Relationship status: Not on file  Other Topics Concern  . Not on file  Social History Narrative   From [place].   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     Works at [employer]     Family History: The patient's family history includes Arthritis in her mother; Diabetes in her father and mother; Heart disease in her father; Hypertension in her father and mother; Stroke in her father. There is no history of Colon cancer or Breast cancer.  ROS:   Please see the history of present illness.    All other systems reviewed and are negative.  EKGs/Labs/Other Studies Reviewed:    The following studies were reviewed today: ***   Recent Labs: 07/25/2017: TSH 2.850 12/14/2017: ALT 28; BUN 25; Creatinine, Ser 1.15; Hemoglobin 11.6; Platelets 147.0; Potassium 4.4; Sodium 139  Recent Lipid Panel    Component Value Date/Time   CHOL 185 12/14/2017 1326   TRIG 86.0 12/14/2017 1326   HDL 81.60 12/14/2017 1326   CHOLHDL 2 12/14/2017 1326   VLDL 17.2 12/14/2017 1326   LDLCALC 86 12/14/2017 1326   LDLDIRECT 83.2 11/09/2010 1244    Physical Exam:    VS:  BP (!) 162/78 (BP Location: Right Arm, Patient Position: Sitting, Cuff Size: Normal)   Pulse (!) 48   Ht 5' 3.5" (1.613 m)   Wt 136 lb (61.7 kg)   SpO2 97%   BMI 23.71 kg/m     Wt Readings from Last 3 Encounters:  01/30/18 136 lb (61.7 kg)  12/14/17 134 lb 8 oz (61 kg)  10/10/17 130 lb 8 oz (59.2 kg)     GEN: Patient is in no acute distress HEENT: Normal NECK: No JVD; No carotid bruits LYMPHATICS: No lymphadenopathy CARDIAC: Hear sounds regular,  2/6 systolic murmur at the apex. RESPIRATORY:  Clear to auscultation without rales, wheezing or rhonchi  ABDOMEN: Soft, non-tender, non-distended MUSCULOSKELETAL:  No edema; No deformity  SKIN: Warm and dry NEUROLOGIC:  Alert and oriented x 3 PSYCHIATRIC:  Normal affect  Signed, Garwin Brothers, MD  01/30/2018 10:12 AM    [facility]

## 2018-01-30 NOTE — Progress Notes (Signed)
Cardiology Office Note:    Date:  01/30/2018   ID:  Karen Saunders, DOB 27-Jul-1955, MRN [id number]  PCP:  Joaquim Nam, MD  Cardiologist:  Garwin Brothers, MD   Referring MD: Joaquim Nam, MD    ASSESSMENT:    1. Atherosclerosis of native coronary artery of native heart without angina pectoris   2. Mixed hyperlipidemia   3. Essential hypertension   4. Hx of CABG    PLAN:    In order of problems listed above:  1. Secondary prevention stressed with the patient.  Importance of compliance with diet and medication stressed and she vocalized understanding.  Blood pressure is stable.  Diet was discussed for dyslipidemia.  Weight reduction was stressed.  Importance of exercise at least 5 times a day 30 minutes a day was emphasized and she is agreeable. 2. Her blood pressure is elevated and she tells me that it is this way even when she checks it regularly at work.  In view of this I will decrease her amlodipine to 10 mg daily and Spironolactone to 25 mg daily.  She will have blood work today she will keep a log of her blood pressures twice a day and she will send those back in a week.  She will be back in 2 weeks for blood work. 3. Echocardiogram will be done to assess murmur heard on auscultation. Patient will be seen in follow-up appointment in 6 months or earlier if the patient has any concerns    Medication Adjustments/Labs and Tests Ordered: Current medicines are reviewed at length with the patient today.  Concerns regarding medicines are outlined above.  Orders Placed This Encounter  Procedures  . Basic Metabolic Panel (BMET)  . Basic Metabolic Panel (BMET)  . EKG 12-Lead   Meds ordered this encounter  Medications  . amLODipine (NORVASC) 10 MG tablet    Sig: Take 0.5-1 tablets (5-10 mg total) by mouth daily.    Dispense:  90 tablet    Refill:  1  . spironolactone (ALDACTONE) 25 MG tablet    Sig: Take 1 tablet (25 mg total) by mouth daily.    Dispense:  90 tablet   Refill:  1     No chief complaint on file.    History of Present Illness:    Karen Saunders is a 63 y.o. female.  Patient has known coronary artery disease status post CABG surgery.  She denies any problems at this time and takes care of activities of daily living.  No chest pain orthopnea or PND.  She tells me that she walks 30 minutes twice a week.  She wants to walk more and is getting ready for it at this time.  She also mentions to me that she works at an urgent care center and her blood pressure is largely elevated and it she wants it to be optimized.  At the time of my evaluation, the patient is alert awake oriented and in no distress.  Past Medical History:  Diagnosis Date  . Anemia    with prev unremarkable heme eval ~2011  . CAD (coronary artery disease)    Dr. Donnie Aho with Cards  . History of gastric ulcer    2005   . Hyperlipidemia   . Hypertension   . IBS (irritable bowel syndrome)   . Leukopenia    with prev unremarkable heme eval ~2011  . Migraine    Unsp w/o intract w/o status migrainosus  . Osteoporosis 11/18/2004  .  Shingles   . Trigeminal neuralgia 03/05/2012    Past Surgical History:  Procedure Laterality Date  . ABDOMINAL HYSTERECTOMY  2002  . CORONARY ARTERY BYPASS GRAFT  2002  . LUMBAR SPINE SURGERY    . Stress cardiolite  09/11/2008   Probably normal with breast attenuation noted but no evidence of ischemia  . TONSILLECTOMY  ~ 1965    Current Medications: Current Meds  Medication Sig  . amLODipine (NORVASC) 10 MG tablet Take 0.5-1 tablets (5-10 mg total) by mouth daily.  Marland Kitchen. aspirin 81 MG tablet Take 81 mg by mouth daily.    Marland Kitchen. atorvastatin (LIPITOR) 80 MG tablet Take 80 mg by mouth daily.  . carbamazepine (TEGRETOL) 200 MG tablet Take 200 mg by mouth 2 (two) times daily.  . cholecalciferol (VITAMIN D) 1000 units tablet Take 1 tab a day as tolerated, goal is a few doses per week  . [facility] LORATADINE-D 24 HOUR 10-240 MG 24 hr tablet TAKE 1 TABLET BY MOUTH  DAILY.  Marland Kitchen. dexlansoprazole (DEXILANT) 60 MG capsule Take 60 mg by mouth daily.  Marland Kitchen. dicyclomine (BENTYL) 10 MG capsule TAKE ONE CAPSULE BY MOUTH 4 TIMES A DAY BEFORE MEALS AND AT BEDTIME  . ezetimibe (ZETIA) 10 MG tablet Take 10 mg by mouth daily.    Marland Kitchen. levocetirizine (XYZAL) 5 MG tablet TAKE 1 TABLET BY MOUTH EVERY DAY  . metoprolol (LOPRESSOR) 50 MG tablet Take 50 mg by mouth 2 (two) times daily.    . predniSONE (DELTASONE) 10 MG tablet Take 2 a day for 5 days, then 1 a day for 5 days, with food. Don't take with aleve/ibuprofen.  . spironolactone (ALDACTONE) 25 MG tablet Take 1 tablet (25 mg total) by mouth daily.  . valsartan-hydrochlorothiazide (DIOVAN-HCT) 320-25 MG tablet Take 1 tablet by mouth daily.  . [DISCONTINUED] amLODipine (NORVASC) 5 MG tablet Take 1-2 tablets (5-10 mg total) by mouth daily.  . [DISCONTINUED] spironolactone (ALDACTONE) 25 MG tablet Take 12.5 mg by mouth daily.     Allergies:   Amoxicillin-pot clavulanate; Azithromycin; Doxycycline; Ibandronate sodium; Iohexol; Raloxifene; Sulfonamide derivatives; and Zoledronic acid   Social History   Socioeconomic History  . Marital status: Divorced    Spouse name: Not on file  . Number of children: 1  . Years of education: Not on file  . Highest education level: Not on file  Occupational History  . Occupation: MEDICAID SPEC    [place] [employer]  . 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 Financial resource strain: Not on file  . Food insecurity:    Worry: Not on file    Inability: Not on file  . Transportation needs:    Medical: Not on file    Non-medical: Not on file  Tobacco Use  . Smoking status: Never Smoker  . Smokeless tobacco: Never Used  Substance and Sexual Activity  . Alcohol use: No    Alcohol/week: 0.0 standard drinks  . Drug use: No  . Sexual activity: Not on file  Lifestyle  . Physical activity:    Days per week: Not on file    Minutes per  session: Not on file  . Stress: Not on file  Relationships  . Social connections:    Talks on phone: Not on file    Gets together: Not on file    Attends religious service: Not on file    Active member of club or organization: Not on file    Attends meetings  of clubs or organizations: Not on file    Relationship status: Not on file  Other Topics Concern  . Not on file  Social History Narrative   From [place].   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     Works at [employer]     Family History: The patient's family history includes Arthritis in her mother; Diabetes in her father and mother; Heart disease in her father; Hypertension in her father and mother; Stroke in her father. There is no history of Colon cancer or Breast cancer.  ROS:   Please see the history of present illness.    All other systems reviewed and are negative.  EKGs/Labs/Other Studies Reviewed:    The following studies were reviewed today: EKG reveals sinus rhythm and nonspecific ST-T changes.   Recent Labs: 07/25/2017: TSH 2.850 12/14/2017: ALT 28; BUN 25; Creatinine, Ser 1.15; Hemoglobin 11.6; Platelets 147.0; Potassium 4.4; Sodium 139  Recent Lipid Panel    Component Value Date/Time   CHOL 185 12/14/2017 1326   TRIG 86.0 12/14/2017 1326   HDL 81.60 12/14/2017 1326   CHOLHDL 2 12/14/2017 1326   VLDL 17.2 12/14/2017 1326   LDLCALC 86 12/14/2017 1326   LDLDIRECT 83.2 11/09/2010 1244    Physical Exam:    VS:  BP (!) 162/78 (BP Location: Right Arm, Patient Position: Sitting, Cuff Size: Normal)   Pulse (!) 48   Ht 5' 3.5" (1.613 m)   Wt 136 lb (61.7 kg)   SpO2 97%   BMI 23.71 kg/m     Wt Readings from Last 3 Encounters:  01/30/18 136 lb (61.7 kg)  12/14/17 134 lb 8 oz (61 kg)  10/10/17 130 lb 8 oz (59.2 kg)     GEN: Patient is in no acute distress HEENT: Normal NECK: No JVD; No  carotid bruits LYMPHATICS: No lymphadenopathy CARDIAC: Hear sounds regular, 2/6 systolic murmur at the apex. RESPIRATORY:  Clear to auscultation without rales, wheezing or rhonchi  ABDOMEN: Soft, non-tender, non-distended MUSCULOSKELETAL:  No edema; No deformity  SKIN: Warm and dry NEUROLOGIC:  Alert and oriented x 3 PSYCHIATRIC:  Normal affect   Signed, Garwin Brothers, MD  01/30/2018 10:33 AM    [facility]

## 2018-01-31 ENCOUNTER — Telehealth: Payer: Self-pay | Admitting: Cardiology

## 2018-01-31 LAB — BASIC METABOLIC PANEL
BUN/Creatinine Ratio: 25 (ref 12–28)
BUN: 27 mg/dL (ref 8–27)
CO2: 23 mmol/L (ref 20–29)
Calcium: 9.3 mg/dL (ref 8.7–10.3)
Chloride: 103 mmol/L (ref 96–106)
Creatinine, Ser: 1.07 mg/dL — ABNORMAL HIGH (ref 0.57–1.00)
GFR calc Af Amer: 63 mL/min/{1.73_m2} (ref >59)
GFR calc non Af Amer: 55 mL/min/{1.73_m2} — ABNORMAL LOW (ref >59)
Glucose: 79 mg/dL (ref 65–99)
Potassium: 3.6 mmol/L (ref 3.5–5.2)
Sodium: 141 mmol/L (ref 134–144)

## 2018-01-31 NOTE — Telephone Encounter (Signed)
° °  1. Which medications need to be refilled? (please list name of each medication and dose if known) Metoprolol tartrate 50mg  BID  2. Which pharmacy/location (including street and city if local pharmacy) is [id number]  3. Do they need a 30 day or 90 day supply? 90

## 2018-02-01 ENCOUNTER — Other Ambulatory Visit: Payer: Self-pay

## 2018-02-01 MED ORDER — METOPROLOL TARTRATE 50 MG PO TABS: 50.0000 mg | ORAL_TABLET | Freq: Two times a day (BID) | ORAL | 2 refills | Status: DC

## 2018-02-01 NOTE — Telephone Encounter (Signed)
Refill sent task complete. 

## 2018-02-05 ENCOUNTER — Telehealth: Payer: Self-pay | Admitting: Cardiology

## 2018-02-05 ENCOUNTER — Ambulatory Visit: Payer: 59 | Admitting: Family Medicine

## 2018-02-05 ENCOUNTER — Other Ambulatory Visit: Payer: Self-pay | Admitting: Cardiology

## 2018-02-05 ENCOUNTER — Encounter: Payer: Self-pay | Admitting: Family Medicine

## 2018-02-05 DIAGNOSIS — R059 Cough, unspecified: Secondary | ICD-10-CM

## 2018-02-05 DIAGNOSIS — R05 Cough: Secondary | ICD-10-CM

## 2018-02-05 MED ORDER — HYDROCODONE-HOMATROPINE 5-1.5 MG/5ML PO SYRP: 5.0000 mL | ORAL_SOLUTION | Freq: Three times a day (TID) | ORAL | 0 refills | Status: DC | PRN

## 2018-02-05 MED ORDER — VALSARTAN-HYDROCHLOROTHIAZIDE 320-25 MG PO TABS: 1.0000 | ORAL_TABLET | Freq: Every day | ORAL | 1 refills | Status: DC

## 2018-02-05 NOTE — Telephone Encounter (Signed)
Call valsartin to [facility] on [address]

## 2018-02-05 NOTE — Progress Notes (Signed)
She has seen Dr. Dion Saucier about her back and leg pain.  She was started on prednisone through the ortho clinic and has f/u pending.  She is doing some better.    She has had a cough episodically.  She continues to cough, in spite of mucinex and cough medicine.  She has sick exposures at work.  No sputum but has sensation of chest congestion.  No fevers.  No wheeze.  No vomiting.  Scratchy throat.  Gargling with warm salt water.  She doesn't have much head congestion.  Som ear popping o/w but not painful and the popping is better.    Her brother required inpatient treatment.  He is back home now.    BP is improved today.   Per HPI unless specifically indicated in ROS section   Meds, vitals, and allergies reviewed.   GEN: nad, alert and oriented HEENT: mucous membranes moist, TM w/o erythema, nasal epithelium injected, OP with cobblestoning NECK: supple w/o LA CV: rrr. PULM: ctab, no inc wob ABD: soft, +bs EXT: no edema

## 2018-02-05 NOTE — Telephone Encounter (Signed)
Left message for patient to return call. It looks like this was already refilled, I will confirm with patient

## 2018-02-05 NOTE — Patient Instructions (Signed)
Use the cough syrup- sedation caution- and update me as needed.  It may be that you need to use albuterol for the cough but I would try the syrup first.  Take care.  Glad to see you.

## 2018-02-05 NOTE — Addendum Note (Signed)
Addended by: Lita Mains on: 02/05/2018 03:06 PM   Modules accepted: Orders

## 2018-02-05 NOTE — Telephone Encounter (Signed)
Medication refilled

## 2018-02-06 ENCOUNTER — Other Ambulatory Visit: Payer: Self-pay | Admitting: *Deleted

## 2018-02-06 MED ORDER — VALSARTAN-HYDROCHLOROTHIAZIDE 320-25 MG PO TABS: 1.0000 | ORAL_TABLET | Freq: Every day | ORAL | 1 refills | Status: DC

## 2018-02-06 NOTE — Telephone Encounter (Signed)
Patient needs her valsartan to go to [facility] on [address] in [place] please

## 2018-02-07 NOTE — Assessment & Plan Note (Signed)
Nontoxic.  Lungs are clear.  Okay for outpatient follow-up.  Not likely to benefit from antibiotics at this point.  Discussed supportive treatment. Use Hycodan- sedation caution- and update me as needed.  It may be that she will need to use albuterol for the cough but I would try Hycodan first.  Discussed.  She agrees.

## 2018-02-14 ENCOUNTER — Telehealth: Payer: Self-pay | Admitting: Cardiology

## 2018-02-14 DIAGNOSIS — I10 Essential (primary) hypertension: Secondary | ICD-10-CM

## 2018-02-14 LAB — BASIC METABOLIC PANEL
BUN/Creatinine Ratio: 12 (ref 12–28)
BUN: 15 mg/dL (ref 8–27)
CO2: 23 mmol/L (ref 20–29)
Calcium: 9.1 mg/dL (ref 8.7–10.3)
Chloride: 103 mmol/L (ref 96–106)
Creatinine, Ser: 1.24 mg/dL — ABNORMAL HIGH (ref 0.57–1.00)
GFR calc Af Amer: 53 mL/min/{1.73_m2} — ABNORMAL LOW (ref >59)
GFR calc non Af Amer: 46 mL/min/{1.73_m2} — ABNORMAL LOW (ref >59)
Glucose: 87 mg/dL (ref 65–99)
Potassium: 4.2 mmol/L (ref 3.5–5.2)
Sodium: 141 mmol/L (ref 134–144)

## 2018-02-14 NOTE — Telephone Encounter (Signed)
Duplicate. Please see previous telephone encounter.

## 2018-02-14 NOTE — Telephone Encounter (Signed)
Patient is asking for her lab results

## 2018-02-14 NOTE — Telephone Encounter (Signed)
Left message to return call 

## 2018-02-14 NOTE — Telephone Encounter (Signed)
Patient informed of lab results and advised to return to our office for a repeat BMP in 1 month, no appointment needed. No need to fast beforehand. Patient verbalized understanding. No further questions.

## 2018-02-20 ENCOUNTER — Other Ambulatory Visit: Payer: Self-pay | Admitting: Cardiology

## 2018-02-20 MED ORDER — EZETIMIBE 10 MG PO TABS: 10.0000 mg | ORAL_TABLET | Freq: Every day | ORAL | 0 refills | Status: DC

## 2018-02-20 NOTE — Telephone Encounter (Signed)
° °  1. Which medications need to be refilled? (please list name of each medication and dose if known) Ezetimibe 10mg  tablet once daily  2. Which pharmacy/location (including street and city if local pharmacy) is medication [facility] be sent to?[id number]  3. Do they need a 30 day or 90 day supply? 90

## 2018-02-20 NOTE — Telephone Encounter (Signed)
Zetia 10mg daily refilled  ?

## 2018-02-22 ENCOUNTER — Other Ambulatory Visit: Payer: Self-pay | Admitting: Cardiology

## 2018-02-27 ENCOUNTER — Encounter: Payer: Self-pay | Admitting: Internal Medicine

## 2018-02-27 ENCOUNTER — Ambulatory Visit: Payer: 59 | Admitting: Internal Medicine

## 2018-02-27 VITALS — BP 130/76 | HR 56 | Temp 97.8°F | Wt 133.0 lb

## 2018-02-27 DIAGNOSIS — B9789 Other viral agents as the cause of diseases classified elsewhere: Secondary | ICD-10-CM

## 2018-02-27 DIAGNOSIS — J329 Chronic sinusitis, unspecified: Secondary | ICD-10-CM

## 2018-02-27 MED ORDER — PREDNISONE 10 MG PO TABS: ORAL_TABLET | ORAL | 0 refills | Status: DC

## 2018-02-27 NOTE — Patient Instructions (Signed)

## 2018-03-01 ENCOUNTER — Encounter: Payer: Self-pay | Admitting: Internal Medicine

## 2018-03-01 NOTE — Progress Notes (Signed)
HPI  Pt presents to the clinic today with c/o facial pain and pressure, nasal congestion and cough. She reports this started 3-4 days ago. She is blowing clear mucous out of her nose. The cough is non productive. She denies runny nose, ear pain or sore throat. She denies fever, chills or body aches. She has tried Xyzal and Tylenol sinus with minimal relief. She has no history of allergies. She has not had sick contacts. She was seen 3 weeks ago for the same, and was given cough syrup and Albuterol which she has been taking with minimal relief.  Review of Systems     Past Medical History:  Diagnosis Date  . Anemia    with prev unremarkable heme eval ~2011  . CAD (coronary artery disease)    Dr. Donnie Aho with Cards  . History of gastric ulcer    2005   . Hyperlipidemia   . Hypertension   . IBS (irritable bowel syndrome)   . Leukopenia    with prev unremarkable heme eval ~2011  . Migraine    Unsp w/o intract w/o status migrainosus  . Osteoporosis 11/18/2004  . Shingles   . Trigeminal neuralgia 03/05/2012    Family History  Problem Relation Age of Onset  . Arthritis Mother   . Diabetes Mother   . Hypertension Mother   . Stroke Father   . Heart disease Father        MI  . Diabetes Father   . Hypertension Father   . Colon cancer Neg Hx   . Breast cancer Neg Hx     Social History   Socioeconomic History  . Marital status: Divorced    Spouse name: Not on file  . Number of children: 1  . Years of education: Not on file  . Highest education level: Not on file  Occupational History  . Occupation: MEDICAID SPEC    Employer: [employer]  . 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 Financial resource strain: Not on file  . Food insecurity:    Worry: Not on file    Inability: Not on file  . Transportation needs:    Medical: Not on file    Non-medical: Not on file  Tobacco Use  . Smoking status: Never Smoker  . Smokeless tobacco:  Never Used  Substance and Sexual Activity  . Alcohol use: No    Alcohol/week: 0.0 standard drinks  . Drug use: No  . Sexual activity: Not on file  Lifestyle  . Physical activity:    Days per week: Not on file    Minutes per session: Not on file  . Stress: Not on file  Relationships  . Social connections:    Talks on phone: Not on file    Gets together: Not on file    Attends religious service: Not on file    Active member of club or organization: Not on file    Attends meetings of clubs or organizations: Not on file    Relationship status: Not on file  . Intimate partner violence:    Fear of current or ex partner: Not on file    Emotionally abused: Not on file    Physically abused: Not on file    Forced sexual activity: Not on file  Other Topics Concern  . Not on file  Social History Narrative   From [place].   Education:  14 years   Regular Exercise:  Yes  Enjoys time at church and jazz music   Retired from [employer] office as of 06/2017.     Works at [employer]    Allergies  Allergen Reactions  . Amoxicillin-Pot Clavulanate     REACTION: Rash on tongue  . Azithromycin Other (See Comments)    diarrhea  . Doxycycline     GI intolerance  . Ibandronate Sodium     REACTION: GI side effects  . Iohexol      Desc: HIVES,NASAL CONGESTION DURING A CARDIAC CATH. REQUIRES PRE-MEDS.   . Raloxifene     REACTION: GI upset  . Sulfonamide Derivatives     REACTION: itching  . Zoledronic Acid     REACTION: unable to take this as she was intolerant of pre-tx calcium and vitamin D     Constitutional: Denies headache, fatigue, fever or abrupt weight changes.  HEENT:  Positive facial pain, nasal congestion. Denies eye redness, ear pain, ringing in the ears, wax buildup, runny nose or sore throat. Respiratory: Positive cough. Denies difficulty breathing or shortness of breath.  Cardiovascular: Denies chest pain, chest tightness, palpitations or swelling in the  hands or feet.   No other specific complaints in a complete review of systems (except as listed in HPI above).  Objective:   BP 130/76   Pulse (!) 56   Temp 97.8 F (36.6 C) (Oral)   Wt 133 lb (60.3 kg)   SpO2 97%   BMI 23.19 kg/m   General: Appears her stated age, well developed, well nourished in NAD. HEENT: Head: normal shape and size, maxillary sinus tenderness noted;  Ears: Tm's gray and intact, turbinates swollen; Nose: mucosa boggy and moist, septum midline; Throat/Mouth: + PND. Teeth present, mucosa erythematous and moist, no exudate noted, no lesions or ulcerations noted.  Neck:  No adenopathy noted.  Cardiovascular: Normal rate and rhythm. S1,S2 noted.  No murmur, rubs or gallops noted.  Pulmonary/Chest: Normal effort and positive vesicular breath sounds. No respiratory distress. No wheezes, rales or ronchi noted.       Assessment & Plan:   Viral Sinusitis  Can use a Neti Pot which can be purchased from your local drug store. Flonase 2 sprays each nostril for 3 days and then as needed. eRx for Pred Taper x 6 days No indication for abx at this time  RTC as needed or if symptoms persist. Nicki Reaper, NP

## 2018-03-06 ENCOUNTER — Ambulatory Visit: Payer: 59 | Admitting: Family Medicine

## 2018-03-07 NOTE — Progress Notes (Signed)
[facility] Pulmonary Medicine Consultation      Assessment and Plan:  Intractable cough. -Possible non-resolving pneumonia. -Has not responded to usual empiric therapies. - We will start empiric trial of Dulera 200, duo nebs 3 times daily. -Patient is asked to call back in 2 weeks with progress, if not improved will consider bronchoscopy. -We will check CT chest, Rast, ACE, rheumatoid factor.  Meds ordered this encounter  Medications  . mometasone-formoterol (DULERA) 200-5 MCG/ACT AERO    Sig: Inhale 2 puffs into the lungs 2 (two) times daily.    Dispense:  8.8 g    Refill:  0    Order Specific Question:   Lot Number?    Answer:   [id number]    Order Specific Question:   Expiration Date?    Answer:   05/08/2018    Order Specific Question:   Manufacturer?    Answer:   [employer] [[id number]]    Order Specific Question:   Quantity    Answer:   1   Orders Placed This Encounter  Procedures  . CT CHEST WO CONTRAST  . Allergens w/Total IgE Area 2  . Angiotensin converting enzyme  . Rheumatoid factor   Return in about 3 months (around 06/07/2018).   Date: 03/07/2018  MRN# [id number] Karen Saunders [birth year]    Karen Saunders is a 63 y.o. old female seen in consultation for chief complaint of:    No chief complaint on file.   HPI:   The patient is a 63 year old female referred with symptoms of cough.  She has been tried empirically on nasal ipratropium, Flonase, Mucinex. She has tried robitussin which helped a bit but did not last. She narcotic cough syrup which gave short term relief.  The cough started as a cold about a month ago, the cold went away but the cough stayed. The cough is worse when laying down at night, and can wake her from sleep. She takes the narcotic cough syrup to help her sleep but she tries to avoid it if she has to work because it make her sleepy.   She has been tried on prednisone but that has not helped.  She has had allergy testing in the past about 10  years ago, she was allergic to dust, down. She has no pets. She has been taking tessalon but does not help. Denies sinus drainage, denies reflux.    No hemoptysis, no weight loss. No reflux or heartburn. Has mild sinus drainage controlled with flonase.   **Chest X ray 07/12/17>> imaging personally reviewed, lungs are unremarkable.   **Chest x-ray 03/01/2015>> strandy atelectasis at the right lung base, otherwise unremarkable lungs. **CBC 03/29/2012>> absolute eosinophil count 0.   Medication:    Current Outpatient Medications:  .  amLODipine (NORVASC) 10 MG tablet, Take 10 mg by mouth daily., Disp: , Rfl:  .  aspirin 81 MG tablet, Take 81 mg by mouth daily.  , Disp: , Rfl:  .  atorvastatin (LIPITOR) 80 MG tablet, TAKE 1 TABLET BY MOUTH EVERY DAY, Disp: 30 tablet, Rfl: 5 .  carbamazepine (TEGRETOL) 200 MG tablet, Take 200 mg by mouth 2 (two) times daily., Disp: , Rfl:  .  cholecalciferol (VITAMIN D) 1000 units tablet, Take 1 tab a day as tolerated, goal is a few doses per week, Disp: , Rfl:  .  dexlansoprazole (DEXILANT) 60 MG capsule, Take 60 mg by mouth daily., Disp: , Rfl:  .  dicyclomine (BENTYL) 10 MG capsule, TAKE  ONE CAPSULE BY MOUTH 4 TIMES A DAY BEFORE MEALS AND AT BEDTIME, Disp: 120 capsule, Rfl: 3 .  ezetimibe (ZETIA) 10 MG tablet, Take 1 tablet (10 mg total) by mouth daily., Disp: 90 tablet, Rfl: 0 .  HYDROcodone-homatropine (HYCODAN) 5-1.5 MG/5ML syrup, Take 5 mLs by mouth every 8 (eight) hours as needed for cough (sedation caution)., Disp: 75 mL, Rfl: 0 .  levocetirizine (XYZAL) 5 MG tablet, TAKE 1 TABLET BY MOUTH EVERY DAY, Disp: 30 tablet, Rfl: 5 .  metoprolol tartrate (LOPRESSOR) 50 MG tablet, Take 1 tablet (50 mg total) by mouth 2 (two) times daily., Disp: 90 tablet, Rfl: 2 .  predniSONE (DELTASONE) 10 MG tablet, Take 6 tabs day 1, 5 tabs day 2, 4 tabs day 3, 3 tabs day 4, 2 tabs day 5, 1 tab day 6, Disp: 21 tablet, Rfl: 0 .  spironolactone (ALDACTONE) 25 MG tablet, Take 1  tablet (25 mg total) by mouth daily., Disp: 90 tablet, Rfl: 1 .  valsartan-hydrochlorothiazide (DIOVAN-HCT) 320-25 MG tablet, Take 1 tablet by mouth daily., Disp: 90 tablet, Rfl: 1   Allergies:  Amoxicillin-pot clavulanate; Azithromycin; Doxycycline; Ibandronate sodium; Iohexol; Raloxifene; Sulfonamide derivatives; and Zoledronic acid      LABORATORY PANEL:   CBC No results for input(s): WBC, HGB, HCT, PLT in the last 168 hours. ------------------------------------------------------------------------------------------------------------------  Chemistries  No results for input(s): NA, K, CL, CO2, GLUCOSE, BUN, CREATININE, CALCIUM, MG, AST, ALT, ALKPHOS, BILITOT in the last 168 hours.  Invalid input(s): GFRCGP ------------------------------------------------------------------------------------------------------------------  Cardiac Enzymes No results for input(s): TROPONINI in the last 168 hours. ------------------------------------------------------------  RADIOLOGY:  No results found.     Thank  you for the consultation and for allowing [facility] to assist in the care of your patient. Our recommendations are noted above.  Please contact us if we can be of further service.   Wells Guiles, M.D., F.C.C.P.  Board Certified in Internal Medicine, Pulmonary Medicine, Critical Care Medicine, and Sleep Medicine.  [facility] Office Number: [phone]   03/07/2018

## 2018-03-09 ENCOUNTER — Ambulatory Visit: Payer: 59 | Admitting: Internal Medicine

## 2018-03-09 ENCOUNTER — Other Ambulatory Visit
Admission: RE | Admit: 2018-03-09 | Discharge: 2018-03-09 | Disposition: A | Discharge status: Home / Self Care | Payer: 59 | Attending: Internal Medicine | Admitting: Internal Medicine

## 2018-03-09 ENCOUNTER — Encounter: Payer: Self-pay | Admitting: Internal Medicine

## 2018-03-09 VITALS — BP 126/82 | HR 52 | Resp 16 | Ht 63.5 in | Wt 130.0 lb

## 2018-03-09 DIAGNOSIS — R059 Cough, unspecified: Secondary | ICD-10-CM

## 2018-03-09 DIAGNOSIS — J069 Acute upper respiratory infection, unspecified: Secondary | ICD-10-CM | POA: Insufficient documentation

## 2018-03-09 DIAGNOSIS — J4550 Severe persistent asthma, uncomplicated: Secondary | ICD-10-CM | POA: Diagnosis not present

## 2018-03-09 DIAGNOSIS — R05 Cough: Secondary | ICD-10-CM | POA: Insufficient documentation

## 2018-03-09 MED ORDER — MOMETASONE FURO-FORMOTEROL FUM 200-5 MCG/ACT IN AERO: 2.0000 | INHALATION_SPRAY | Freq: Two times a day (BID) | RESPIRATORY_TRACT | 0 refills | Status: DC

## 2018-03-09 MED ORDER — IPRATROPIUM-ALBUTEROL 0.5-2.5 (3) MG/3ML IN SOLN: 3.0000 mL | Freq: Three times a day (TID) | RESPIRATORY_TRACT | 5 refills | Status: DC

## 2018-03-09 NOTE — Addendum Note (Signed)
Addended by: Deloria Lair on: 03/09/2018 11:53 AM   Modules accepted: Orders

## 2018-03-09 NOTE — Patient Instructions (Addendum)
Start dulera, 2 puffs twice daily, rinse mouth after use.  Start nebulized duonebs three times daily.  Will check allergy testing.   Do both of these for 2 weeks and call us back with progress. If not better will plan on bronchoscopy.

## 2018-03-09 NOTE — Addendum Note (Signed)
Addended by: Janean Sark on: 03/09/2018 11:30 AM   Modules accepted: Orders

## 2018-03-10 LAB — RHEUMATOID FACTOR: Rhuematoid fact SerPl-aCnc: <10 IU/mL (ref 0.0–13.9)

## 2018-03-14 LAB — ALLERGENS W/TOTAL IGE AREA 2
Alternaria Alternata IgE: <0.1 kU/L
Aspergillus Fumigatus IgE: <0.1 kU/L
Bermuda Grass IgE: <0.1 kU/L
Cat Dander IgE: <0.1 kU/L
Cedar, Mountain IgE: <0.1 kU/L
Cladosporium Herbarum IgE: <0.1 kU/L
Cockroach, German IgE: <0.1 kU/L
Common Silver Birch IgE: <0.1 kU/L
Cottonwood IgE: <0.1 kU/L
D Farinae IgE: <0.1 kU/L
D Pteronyssinus IgE: <0.1 kU/L
Dog Dander IgE: <0.1 kU/L
Elm, American IgE: <0.1 kU/L
IgE (Immunoglobulin E), Serum: <2 IU/mL — ABNORMAL LOW (ref 6–495)
Johnson Grass IgE: <0.1 kU/L
Maple/Box Elder IgE: <0.1 kU/L
Mouse Urine IgE: <0.1 kU/L
Oak, White IgE: <0.1 kU/L
Pecan, Hickory IgE: <0.1 kU/L
Penicillium Chrysogen IgE: <0.1 kU/L
Pigweed, Rough IgE: <0.1 kU/L
Ragweed, Short IgE: <0.1 kU/L
Sheep Sorrel IgE Qn: <0.1 kU/L
Timothy Grass IgE: <0.1 kU/L
White Mulberry IgE: <0.1 kU/L

## 2018-03-19 ENCOUNTER — Telehealth: Payer: Self-pay | Admitting: Internal Medicine

## 2018-03-19 DIAGNOSIS — J4540 Moderate persistent asthma, uncomplicated: Secondary | ICD-10-CM

## 2018-03-19 NOTE — Telephone Encounter (Signed)
Called patient and she states she only received neb tubing:  Called [facility] spoke with Raven. New order placed for nebulizer. Per pt she only received tubing and no nebulizer. Order states nebulizer kit therefore only tubing dispensed. Nothing further needed.

## 2018-03-28 ENCOUNTER — Ambulatory Visit (HOSPITAL_COMMUNITY)
Admission: RE | Admit: 2018-03-28 | Discharge: 2018-03-28 | Disposition: A | Discharge status: Home / Self Care | Payer: 59 | Source: Ambulatory Visit | Attending: Internal Medicine | Admitting: Internal Medicine

## 2018-03-28 ENCOUNTER — Other Ambulatory Visit: Payer: Self-pay

## 2018-03-28 DIAGNOSIS — J069 Acute upper respiratory infection, unspecified: Secondary | ICD-10-CM | POA: Diagnosis present

## 2018-03-28 DIAGNOSIS — R05 Cough: Secondary | ICD-10-CM | POA: Insufficient documentation

## 2018-03-28 DIAGNOSIS — R059 Cough, unspecified: Secondary | ICD-10-CM

## 2018-03-28 DIAGNOSIS — J4550 Severe persistent asthma, uncomplicated: Secondary | ICD-10-CM | POA: Diagnosis not present

## 2018-03-29 ENCOUNTER — Ambulatory Visit (HOSPITAL_COMMUNITY): Payer: 59

## 2018-04-30 ENCOUNTER — Other Ambulatory Visit: Payer: Self-pay | Admitting: Family Medicine

## 2018-05-07 ENCOUNTER — Other Ambulatory Visit: Payer: Self-pay

## 2018-05-07 ENCOUNTER — Telehealth: Payer: Self-pay | Admitting: *Deleted

## 2018-05-07 MED ORDER — TELMISARTAN-HCTZ 80-25 MG PO TABS: 1.0000 | ORAL_TABLET | Freq: Every day | ORAL | 6 refills | Status: DC

## 2018-05-07 NOTE — Telephone Encounter (Signed)
Lets do telmisartan

## 2018-05-07 NOTE — Telephone Encounter (Signed)
Medication is on national backorder from mfg, pharmacy recommends:  Losartan-HCTZ  100-25 talmisartan-HCTZ        80-25 Please advise which medication you would like patient to switch to and I will send script.

## 2018-05-07 NOTE — Telephone Encounter (Signed)
Patient switched from valsart/hctz to telmisartan/HCTZ 80-25 due to backorder. Replacement script sent.

## 2018-05-07 NOTE — Telephone Encounter (Signed)
Please call the pharmacist and see if we can substitute any other medicine and in the same class and also get the dosage and let me know

## 2018-05-07 NOTE — Telephone Encounter (Signed)
Pt states Valsartan is on back order. Has called around and can't find her med in the right strength. Is there an alternative to this med. Please advise

## 2018-05-08 ENCOUNTER — Telehealth: Payer: Self-pay

## 2018-05-08 NOTE — Telephone Encounter (Signed)
Verified medication

## 2018-05-08 NOTE — Telephone Encounter (Signed)
Called to verify patient was actually able to pick up replacement med. She states she has not received a call from pharmacy. RN advised she call to day to verify and start medication. No further questions at this time.

## 2018-05-11 ENCOUNTER — Other Ambulatory Visit: Payer: Self-pay | Admitting: Cardiology

## 2018-05-31 ENCOUNTER — Other Ambulatory Visit: Payer: Self-pay | Admitting: Cardiology

## 2018-06-17 ENCOUNTER — Other Ambulatory Visit: Payer: Self-pay | Admitting: Family Medicine

## 2018-08-03 ENCOUNTER — Other Ambulatory Visit: Payer: Self-pay | Admitting: Internal Medicine

## 2018-08-08 ENCOUNTER — Telehealth: Payer: Self-pay | Admitting: *Deleted

## 2018-08-08 ENCOUNTER — Encounter: Payer: Self-pay | Admitting: Family Medicine

## 2018-08-08 NOTE — Telephone Encounter (Signed)
Pt called about new medication and pt is anemic and she wants to discuss this medication. Please advise.

## 2018-08-09 ENCOUNTER — Other Ambulatory Visit: Payer: Self-pay | Admitting: Cardiology

## 2018-08-09 NOTE — Telephone Encounter (Signed)
Patient states she was advised by alternated physician to have an endoscopy performed to find causes for anemia. She called our office to verify what previous labs performed/medication therapies. All questions answered.

## 2018-08-13 ENCOUNTER — Telehealth: Payer: Self-pay | Admitting: Family Medicine

## 2018-08-13 ENCOUNTER — Other Ambulatory Visit: Payer: Self-pay | Admitting: Family Medicine

## 2018-08-13 NOTE — Telephone Encounter (Signed)
Notify patient.  I saw the notes from the GI clinic.  The patient was asking about the need to go through with repeat colonoscopy.  It is reasonable to consider since her hemoglobin is lower than previous.  She does have a history of anemia but usually her hemoglobin is slightly higher than it was on the most recent check at the GI clinic.  If Dr. Collene Mares recommended going through with the colonoscopy, then I think it is reasonable to do so.  I appreciate GI input.  Thanks.

## 2018-08-14 ENCOUNTER — Other Ambulatory Visit: Payer: Self-pay | Admitting: Cardiology

## 2018-08-14 MED ORDER — TELMISARTAN-HCTZ 80-25 MG PO TABS: 1.0000 | ORAL_TABLET | Freq: Every day | ORAL | 3 refills | Status: DC

## 2018-08-14 NOTE — Telephone Encounter (Signed)
New Message    *STAT* If patient is at the pharmacy, call can be transferred to refill team.   1. Which medications need to be refilled? (please list name of each medication and dose if known) telmisartan-hydrochlorothiazide (MICARDIS HCT) 80-25 MG tablet  Patient also been taking Valsartan   2. Which pharmacy/location (including street and city if local pharmacy) is medication to be sent to? [place], [place] - [address] AT [place]  3. Do they need a 30 day or 90 day supply? 30 day supply

## 2018-08-14 NOTE — Telephone Encounter (Signed)
Micardis 80/25 mg daily refilled.

## 2018-08-14 NOTE — Telephone Encounter (Signed)
Pt aware of rec's per Dr [name]. Nothing further needed.

## 2018-08-20 ENCOUNTER — Telehealth: Payer: Self-pay | Admitting: Cardiology

## 2018-08-20 NOTE — Telephone Encounter (Signed)
°*  STAT* If patient is at the pharmacy, call can be transferred to refill team.   1. Which medications need to be refilled? (please list name of each medication and dose if known) Valsartin  2. Which pharmacy/location (including street and city if local pharmacy) is medication to be sent to? [facility] on [address]  3. Do they need a 30 day or 90 day supply? [place]

## 2018-08-22 MED ORDER — VALSARTAN-HYDROCHLOROTHIAZIDE 320-25 MG PO TABS: ORAL_TABLET | ORAL | 0 refills | Status: DC

## 2018-10-08 ENCOUNTER — Other Ambulatory Visit: Payer: Self-pay | Admitting: Cardiology

## 2018-10-23 NOTE — Progress Notes (Signed)
Cardiology Office Note:    Date:  10/24/2018   ID:  Karen Saunders, DOB Feb 13, 1955, MRN [id number]  PCP:  Tonia Ghent, MD  Cardiologist:  Sinclair Grooms, MD   Referring MD: Tonia Ghent, MD   Chief Complaint  Patient presents with  . Coronary Artery Disease    History of Present Illness:    Karen Saunders is a 63 y.o. female with a hx of CAD with coronary bypass surgery 2002 (previously followed by Dr. Tollie Eth and Dr. Geraldo Pitter), hyperlipidemia, hypertension, and history of chronic anemia.  She is followed by Dr. Elveria Rising.  [name] and is referred to establish longitudinal cardiology care at the patient's request.  .  Previously followed by Dr. Wynonia Lawman.  She has a history of prior coronary bypass grafting x6 in 2004.  Surgery was done by Dr. Caffie Pinto.  She has had no recurrence of symptoms similar to those experienced prior to bypass surgery.  She was referred to Dr. Wynonia Lawman because exertional activities led to burning sensation in her chest.  She is currently active but not walking as much as she should according to her.  She has never smoked cigarettes.  She has a family history of vascular disease with her father dying in his late 30s and a younger brother having an aortic dissection.  She states that Dr. Wynonia Lawman is told her of an abnormal EKG in the past.  She has never heard the term left bundle branch block.  She also has no history of murmur.  She feels she is stable compared to prior visits.  Past Medical History:  Diagnosis Date  . Anemia    with prev unremarkable heme eval ~2011  . CAD (coronary artery disease)    Dr. Wynonia Lawman with Cards  . History of gastric ulcer    2005   . Hyperlipidemia   . Hypertension   . IBS (irritable bowel syndrome)   . Leukopenia    with prev unremarkable heme eval ~2011  . Migraine    Unsp w/o intract w/o status migrainosus  . Osteoporosis 11/18/2004  . Shingles   . Trigeminal neuralgia 03/05/2012    Past Surgical History:   Procedure Laterality Date  . ABDOMINAL HYSTERECTOMY  2002  . CORONARY ARTERY BYPASS GRAFT  2002  . LUMBAR SPINE SURGERY    . Stress cardiolite  09/11/2008   Probably normal with breast attenuation noted but no evidence of ischemia  . TONSILLECTOMY  ~ 1965    Current Medications: Current Meds  Medication Sig  . amLODipine (NORVASC) 5 MG tablet TAKE 1-2 TABLETS (5-10 MG TOTAL) BY MOUTH DAILY.  Marland Kitchen aspirin 81 MG tablet Take 81 mg by mouth daily.    Marland Kitchen atorvastatin (LIPITOR) 80 MG tablet TAKE 1 TABLET BY MOUTH EVERY DAY  . carbamazepine (TEGRETOL) 200 MG tablet Take 200 mg by mouth 2 (two) times daily.  . cholecalciferol (VITAMIN D) 1000 units tablet Take 1 tab a day as tolerated, goal is a few doses per week  . dexlansoprazole (DEXILANT) 60 MG capsule Take 60 mg by mouth daily.  Marland Kitchen dicyclomine (BENTYL) 10 MG capsule TAKE ONE CAPSULE BY MOUTH 4 TIMES A DAY BEFORE MEALS AND AT BEDTIME  . ezetimibe (ZETIA) 10 MG tablet TAKE 1 TABLET BY MOUTH EVERY DAY  . levocetirizine (XYZAL) 5 MG tablet TAKE 1 TABLET BY MOUTH EVERY DAY  . metoprolol tartrate (LOPRESSOR) 50 MG tablet TAKE 1 TABLET BY MOUTH TWICE A DAY  . spironolactone (  ALDACTONE) 25 MG tablet Take 1 tablet (25 mg total) by mouth daily.  Marland Kitchen. telmisartan-hydrochlorothiazide (MICARDIS HCT) 80-25 MG tablet Take 1 tablet by mouth daily.  . valsartan-hydrochlorothiazide (DIOVAN-HCT) 320-25 MG tablet TK 1 T PO D     Allergies:   Amoxicillin-pot clavulanate, Azithromycin, Doxycycline, Ibandronate sodium, Iohexol, Raloxifene, Sulfonamide derivatives, and Zoledronic acid   Social History   Socioeconomic History  . Marital status: Divorced    Spouse name: Not on file  . Number of children: 1  . Years of education: Not on file  . Highest education level: Not on file  Occupational History  . Occupation: MEDICAID SPEC    [place] [employer]  . 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 Financial resource  strain: Not on file  . Food insecurity    Worry: Not on file    Inability: Not on file  . Transportation needs    Medical: Not on file    Non-medical: Not on file  Tobacco Use  . Smoking status: Never Smoker  . Smokeless tobacco: Never Used  Substance and Sexual Activity  . Alcohol use: No    Alcohol/week: 0.0 standard drinks  . Drug use: No  . Sexual activity: Not on file  Lifestyle  . Physical activity    Days per week: Not on file    Minutes per session: Not on file  . Stress: Not on file  Relationships  . Social Musicianconnections    Talks on phone: Not on file    Gets together: Not on file    Attends religious service: Not on file    Active member of club or organization: Not on file    Attends meetings of clubs or organizations: Not on file    Relationship status: Not on file  Other Topics Concern  . Not on file  Social History Narrative   From [place].   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     Works at [employer]     Family History: The patient's family history includes Arthritis in her mother; Diabetes in her father and mother; Heart disease in her father; Hypertension in her father and mother; Stroke in her father. There is no history of Colon cancer or Breast cancer.  ROS:   Please see the history of present illness.    No particular complaints today.  She has never been told she has a murmur.  Having no medication side effects.  There are no neurological complaints.  All other systems reviewed and are negative.  EKGs/Labs/Other Studies Reviewed:    The following studies were reviewed today: No recent functional data.  EKG:  EKG the most recent EKG was performed on January 30, 2018 Sinus bradycardia at 48 bpm, and left bundle branch block.  When compared to prior tracings, the left bundle is new. Recent Labs: 12/14/2017: ALT 28; Hemoglobin 11.6;  Platelets 147.0 02/13/2018: BUN 15; Creatinine, Ser 1.24; Potassium 4.2; Sodium 141  Recent Lipid Panel    Component Value Date/Time   CHOL 185 12/14/2017 1326   TRIG 86.0 12/14/2017 1326   HDL 81.60 12/14/2017 1326   CHOLHDL 2 12/14/2017 1326   VLDL 17.2 12/14/2017 1326   LDLCALC 86 12/14/2017 1326   LDLDIRECT 83.2 11/09/2010 1244    Physical Exam:    VS:  BP (!) 142/82   Pulse (!) 57   Ht 5' 3.5" (1.613 m)   Wt 128 lb (58.1 kg)   SpO2 98%   BMI 22.32 kg/m     Wt Readings from Last 3 Encounters:  10/24/18 128 lb (58.1 kg)  03/09/18 130 lb (59 kg)  02/27/18 133 lb (60.3 kg)     GEN: Healthy-appearing. No acute distress HEENT: Normal NECK: No JVD. LYMPHATICS: No lymphadenopathy CARDIAC: 2/6 right upper sternal border systolic murmur.  RRR without diastolic murmur, gallop, or edema. VASCULAR:  Normal Pulses. No bruits. RESPIRATORY:  Clear to auscultation without rales, wheezing or rhonchi  ABDOMEN: Soft, non-tender, non-distended, No pulsatile mass, MUSCULOSKELETAL: No deformity  SKIN: Warm and dry NEUROLOGIC:  Alert and oriented x 3 PSYCHIATRIC:  Normal affect   ASSESSMENT:    1. Hx of CABG   2. Mixed hyperlipidemia   3. Essential hypertension   4. Systolic murmur   5. Left bundle branch block   6. Educated about COVID-19 virus infection    PLAN:    In order of problems listed above:  1. Secondary prevention is discussed. 2. Most recent lipid panel is been October 2019.  She feels this will be done by her primary physician Dr. Para March sometime soon. 3. Blood pressure is adequately controlled target should be under 140/90 and ideally 130/80.  I will keep an eye on this.  Says blood pressure has been much better controlled since Spironolactone was added. 4. Systolic murmur sounds aortic.  2D Doppler echocardiogram will be done to assess aortic valve and also evaluate LV function given new left bundle branch block. 5. This is new compared to prior tracings.   6.  The 3W's are discussed and endorsed by the patient.  Overall education and awareness concerning primary/secondary risk prevention was discussed in detail: LDL less than 70, hemoglobin A1c less than 7, blood pressure target less than 130/80 mmHg, >150 minutes of moderate aerobic activity per week, avoidance of smoking, weight control (via diet and exercise), and continued surveillance/management of/for obstructive sleep apnea.   The 3 diabetes Medication Adjustments/Labs and Tests Ordered: Current medicines are reviewed at length with the patient today.  Concerns regarding medicines are outlined above.  No orders of the defined types were placed in this encounter.  No orders of the defined types were placed in this encounter.   Patient Instructions  Medication Instructions:  Your physician recommends that you continue on your current medications as directed. Please refer to the Current Medication list given to you today.  If you need a refill on your cardiac medications before your next appointment, please call your pharmacy.   Lab work: None If you have labs (blood work) drawn today and your tests are completely normal, you will receive your results only by: Marland Kitchen MyChart Message (if you have MyChart) OR . A paper copy in the mail If you have any lab test that is abnormal or we need to change your treatment, we will call you to review the results.  Testing/Procedures: Your physician has requested that you have an echocardiogram. Echocardiography is a painless test that uses sound waves to create images of your heart. It provides your doctor with information about the size and shape of your heart and how well your heart's chambers and valves are working. This procedure takes approximately one hour. There are no restrictions for this procedure.   Follow-Up: At [facility], you and your health needs are our priority.  As part of  our continuing mission to provide you with exceptional heart  care, we have created designated Provider Care Teams.  These Care Teams include your primary Cardiologist (physician) and Advanced Practice Providers (APPs -  Physician Assistants and Nurse Practitioners) who all work together to provide you with the care you need, when you need it. You will need a follow up appointment in 6 months.  Please call our office 2 months in advance to schedule this appointment.  You may see Lesleigh Noe, MD or one of the following Advanced Practice Providers on your designated Care Team:   Norma Fredrickson, NP Nada Boozer, NP . Georgie Chard, NP  Any Other Special Instructions Will Be Listed Below (If Applicable).       Signed, Lesleigh Noe, MD  10/24/2018 3:34 PM    [facility]

## 2018-10-24 ENCOUNTER — Ambulatory Visit (INDEPENDENT_AMBULATORY_CARE_PROVIDER_SITE_OTHER): Payer: Medicare Other | Admitting: Interventional Cardiology

## 2018-10-24 ENCOUNTER — Other Ambulatory Visit: Payer: Self-pay

## 2018-10-24 ENCOUNTER — Encounter: Payer: Self-pay | Admitting: Interventional Cardiology

## 2018-10-24 VITALS — BP 142/82 | HR 57 | Ht 63.5 in | Wt 128.0 lb

## 2018-10-24 DIAGNOSIS — I447 Left bundle-branch block, unspecified: Secondary | ICD-10-CM

## 2018-10-24 DIAGNOSIS — E782 Mixed hyperlipidemia: Secondary | ICD-10-CM | POA: Diagnosis not present

## 2018-10-24 DIAGNOSIS — Z7189 Other specified counseling: Secondary | ICD-10-CM

## 2018-10-24 DIAGNOSIS — R011 Cardiac murmur, unspecified: Secondary | ICD-10-CM

## 2018-10-24 DIAGNOSIS — Z951 Presence of aortocoronary bypass graft: Secondary | ICD-10-CM

## 2018-10-24 DIAGNOSIS — I10 Essential (primary) hypertension: Secondary | ICD-10-CM | POA: Diagnosis not present

## 2018-10-24 NOTE — Patient Instructions (Signed)
Medication Instructions:  Your physician recommends that you continue on your current medications as directed. Please refer to the Current Medication list given to you today.  If you need a refill on your cardiac medications before your next appointment, please call your pharmacy.   Lab work: None If you have labs (blood work) drawn today and your tests are completely normal, you will receive your results only by: Marland Kitchen MyChart Message (if you have MyChart) OR . A paper copy in the mail If you have any lab test that is abnormal or we need to change your treatment, we will call you to review the results.  Testing/Procedures: Your physician has requested that you have an echocardiogram. Echocardiography is a painless test that uses sound waves to create images of your heart. It provides your doctor with information about the size and shape of your heart and how well your heart's chambers and valves are working. This procedure takes approximately one hour. There are no restrictions for this procedure.   Follow-Up: At [facility], you and your health needs are our priority.  As part of our continuing mission to provide you with exceptional heart care, we have created designated Provider Care Teams.  These Care Teams include your primary Cardiologist (physician) and Advanced Practice Providers (APPs -  Physician Assistants and Nurse Practitioners) who all work together to provide you with the care you need, when you need it. You will need a follow up appointment in 6 months.  Please call our office 2 months in advance to schedule this appointment.  You may see Sinclair Grooms, MD or one of the following Advanced Practice Providers on your designated Care Team:   Truitt Merle, NP Cecilie Kicks, NP . Kathyrn Drown, NP  Any Other Special Instructions Will Be Listed Below (If Applicable).

## 2018-10-25 ENCOUNTER — Other Ambulatory Visit: Payer: Self-pay | Admitting: Family Medicine

## 2018-10-25 ENCOUNTER — Other Ambulatory Visit: Payer: Self-pay | Admitting: Cardiology

## 2018-11-02 ENCOUNTER — Other Ambulatory Visit: Payer: Self-pay

## 2018-11-02 ENCOUNTER — Ambulatory Visit (HOSPITAL_COMMUNITY): Payer: Medicare Other | Attending: Cardiology

## 2018-11-02 DIAGNOSIS — R011 Cardiac murmur, unspecified: Secondary | ICD-10-CM | POA: Insufficient documentation

## 2018-11-07 ENCOUNTER — Other Ambulatory Visit: Payer: Self-pay | Admitting: Cardiology

## 2018-12-11 ENCOUNTER — Telehealth: Payer: Self-pay | Admitting: Family Medicine

## 2018-12-11 ENCOUNTER — Encounter: Payer: Self-pay | Admitting: Family Medicine

## 2018-12-11 NOTE — Telephone Encounter (Signed)
Please call patient about getting scheduled for a video vs phone visit.  Thanks.

## 2018-12-11 NOTE — Telephone Encounter (Signed)
Patient has been scheduled for video visit on Thursday 12/3. Thanks!

## 2018-12-12 ENCOUNTER — Other Ambulatory Visit: Payer: Self-pay | Admitting: Family Medicine

## 2018-12-13 ENCOUNTER — Other Ambulatory Visit: Payer: Self-pay

## 2018-12-13 ENCOUNTER — Ambulatory Visit (INDEPENDENT_AMBULATORY_CARE_PROVIDER_SITE_OTHER): Payer: Medicare Other | Admitting: Family Medicine

## 2018-12-13 ENCOUNTER — Encounter: Payer: Self-pay | Admitting: Family Medicine

## 2018-12-13 DIAGNOSIS — G4700 Insomnia, unspecified: Secondary | ICD-10-CM | POA: Diagnosis not present

## 2018-12-13 DIAGNOSIS — K589 Irritable bowel syndrome without diarrhea: Secondary | ICD-10-CM

## 2018-12-13 MED ORDER — AMLODIPINE BESYLATE 5 MG PO TABS: 10.0000 mg | ORAL_TABLET | Freq: Every day | ORAL | Status: DC

## 2018-12-13 MED ORDER — VALSARTAN-HYDROCHLOROTHIAZIDE 320-25 MG PO TABS: ORAL_TABLET | ORAL | Status: DC

## 2018-12-13 MED ORDER — MELATONIN 3 MG PO TABS: 3.0000 mg | ORAL_TABLET | Freq: Every evening | ORAL | Status: DC | PRN

## 2018-12-13 NOTE — Progress Notes (Signed)
Interactive audio and video telecommunications were attempted between this provider and patient, however failed, due to patient having technical difficulties OR patient did not have access to video capability.  We continued and completed visit with audio only.   Virtual Visit via Telephone Note  I connected with patient on 12/13/18  at 9:18 AM  by telephone and verified that I am speaking with the correct person using two identifiers.  Location of patient: home.   Location of MD: [facility] Name of referring provider (if blank then none associated): Names per persons and role in encounter:  MD: Earlyne Iba, Patient: name listed above.    I discussed the limitations, risks, security and privacy concerns of performing an evaluation and management service by telephone and the availability of in person appointments. I also discussed with the patient that there may be a patient responsible charge related to this service. The patient expressed understanding and agreed to proceed.  CC: insomnia.   History of Present Illness:  She is working less hours at [employer] now.  She is working a few hours at [employer].  Her work situation is okay.  D/w pt.    Insomnia.  Noted recently.  She is waking multiple times.  She has trouble getting to sleep. She had usually listened to some relaxing music but that hasn't helped recently.  She has some nighttime stomach gurgling and she thought that could be contributing to insomnia.  She has IBS at baseline. She is going to ask GI for input on that.    Pandemic considerations dw pt.  She has support through church via zoom.  She is masking.   No fevers, chills, no vomiting.  She has mild head congestion and that improved with OTC meds.     Observations/Objective: nad ncat  Assessment and Plan: Insomnia.  Discussed options.  She can try melatonin 3-5mg  qhs prn.  She can get that OTC.  Hold on trazodone for now, d/w pt about that but hold on rx for  now.  I can send in later if needed. She'll update me as needed.   IBS.  She'll call GI about follow up.  I'll await Gi input.   Follow Up Instructions: see above.    I discussed the assessment and treatment plan with the patient. The patient was provided an opportunity to ask questions and all were answered. The patient agreed with the plan and demonstrated an understanding of the instructions.   The patient was advised to call back or seek an in-person evaluation if the symptoms worsen or if the condition fails to improve as anticipated.  I provided 15 minutes of non-face-to-face time during this encounter.  Elsie Stain, MD

## 2018-12-13 NOTE — Assessment & Plan Note (Signed)
IBS.  She'll call GI about follow up.  I'll await Gi input.  Pt agrees.

## 2018-12-13 NOTE — Assessment & Plan Note (Signed)
Insomnia.  Discussed options.  She can try melatonin 3-5mg  qhs prn.  She can get that OTC.  Hold on trazodone for now, d/w pt about that but hold on rx for now.  I can send in later if needed. She'll update me as needed.

## 2018-12-29 ENCOUNTER — Other Ambulatory Visit: Payer: Self-pay | Admitting: Cardiology

## 2018-12-29 ENCOUNTER — Other Ambulatory Visit: Payer: Self-pay | Admitting: Family Medicine

## 2019-01-14 ENCOUNTER — Ambulatory Visit: Payer: Medicare Other | Attending: Internal Medicine

## 2019-01-14 ENCOUNTER — Telehealth: Payer: Self-pay | Admitting: *Deleted

## 2019-01-14 ENCOUNTER — Other Ambulatory Visit: Payer: Medicare Other

## 2019-01-14 DIAGNOSIS — Z20822 Contact with and (suspected) exposure to covid-19: Secondary | ICD-10-CM

## 2019-01-14 NOTE — Telephone Encounter (Signed)
Noted. Thanks. Agreed.  

## 2019-01-14 NOTE — Telephone Encounter (Signed)
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 Patient wanted information on testing for covid. Patient stated that she has called [facility] and they don't have anything available for several days. Information along with telephone number for [facility] testing given to patient.

## 2019-01-15 LAB — NOVEL CORONAVIRUS, NAA: SARS-CoV-2, NAA: NOT DETECTED

## 2019-01-24 ENCOUNTER — Telehealth: Payer: Self-pay

## 2019-01-24 NOTE — Telephone Encounter (Signed)
Received Rx request for nebulizer and nebulizer kit.  Pt was last seen by Dr. Ashby Dawes on 03/09/2018 and was instructed to f/u in 18mo. Pt does not currently have a pending OV.   Lm for pt to schedule an OV, as this is needed prior to Rx being signed.

## 2019-01-25 NOTE — Telephone Encounter (Signed)
Pt returning call.  [phone].

## 2019-01-25 NOTE — Telephone Encounter (Signed)
Lm for pt

## 2019-01-25 NOTE — Telephone Encounter (Signed)
Called and spoke to pt and relayed below message.  Pt did not wish to schedule an appt at this time, as she does not use her nebulizer. Advised pt to contact our office for an appt in the future. Nothing further is needed.

## 2019-02-12 ENCOUNTER — Other Ambulatory Visit: Payer: Self-pay | Admitting: Cardiology

## 2019-02-12 NOTE — Telephone Encounter (Signed)
Rx refill sent to pharmacy. 

## 2019-02-28 ENCOUNTER — Other Ambulatory Visit: Payer: Self-pay | Admitting: Cardiology

## 2019-03-11 ENCOUNTER — Other Ambulatory Visit: Payer: Self-pay

## 2019-03-11 ENCOUNTER — Encounter: Payer: Self-pay | Admitting: Family Medicine

## 2019-03-11 ENCOUNTER — Ambulatory Visit (INDEPENDENT_AMBULATORY_CARE_PROVIDER_SITE_OTHER): Payer: Medicare Other | Admitting: Family Medicine

## 2019-03-11 DIAGNOSIS — J209 Acute bronchitis, unspecified: Secondary | ICD-10-CM

## 2019-03-11 MED ORDER — HYDROCODONE-HOMATROPINE 5-1.5 MG/5ML PO SYRP: 5.0000 mL | ORAL_SOLUTION | Freq: Three times a day (TID) | ORAL | 0 refills | Status: DC | PRN

## 2019-03-11 MED ORDER — CEFDINIR 300 MG PO CAPS: 300.0000 mg | ORAL_CAPSULE | Freq: Two times a day (BID) | ORAL | 0 refills | Status: DC

## 2019-03-11 NOTE — Assessment & Plan Note (Signed)
2 weeks of uncontrolled cough-getting worse but w/o sob or wheezing  Suspect infectious  Enc her to get tested for covid (she works at [employer] and has access and will call us with result) Isolate, rest , drink fluids  Px cefdinir given length of illness Hycodan for cough  Update if not starting to improve in a week or if worsening  -would consider resp clinic

## 2019-03-11 NOTE — Progress Notes (Signed)
Virtual Visit via Video Note  I connected with Karen Saunders on 03/11/19 at  3:45 PM EST by a video enabled telemedicine application and verified that I am speaking with the correct person using two identifiers.  Location: Patient: home Provider: office   I discussed the limitations of evaluation and management by telemedicine and the availability of in person appointments. The patient expressed understanding and agreed to proceed.  Parties involved in encounter  Patient: Karen Saunders   Provider:  Roxy Manns MD    History of Present Illness: 64 yo F pt of Dr Para March presents with cough   She works at an urgent care part time  She keeps her mask on all the time    Coughing for over 2 weeks  Very seldom productive   Has to blow nose only after a bad cough spell   No wheezing or tight chest  No fever  Has not felt sick   Was a little nauseated -poss due to the cough medicines   One ear hurt when she coughed a lot-it is better now  Throat is a little raw feeling - from cough as well   No loss of smell or taste  No covid contacts at all   She tested neg for covid 1/4 -neg  Not during this  Had first vaccine     Tried mucinex 12 h  Then delsym  Then another cough med- tussin   L side of throat -has a tickle that won't stop  Drinks water  Did some salt water gargles  Also vics vapor rub   Her GERD is under good control with dexilant   If she has seasonal allergies- it is really mild   Patient Active Problem List   Diagnosis Date Noted  . Insomnia 12/13/2018  . Essential hypertension 01/30/2018  . Hx of CABG 01/30/2018  . Advance care planning 12/19/2017  . Ear clicks 12/19/2017  . Low back pain 07/23/2017  . Occipital pain 04/20/2016  . S/P lumbar spine operation 01/07/2016  . GERD (gastroesophageal reflux disease) 11/24/2015  . Herniated nucleus pulposus, L3-4 right 11/24/2015  . Lumbar spinal stenosis 11/24/2015  . Acute bronchitis 01/23/2015  .  Vitiligo 01/23/2015  . Lumbar disc disease 01/16/2015  . Right sided sciatica 08/04/2014  . Sleep disorder 07/01/2014  . Nocturnal cough 09/29/2013  . Cough 09/13/2012  . History of gastric ulcer   . Cervical disc disease   . Routine physical examination 11/10/2010  . Anemia 11/10/2010  . Osteoporosis   . Migraine headache   . Hypertensive heart disease   . Coronary atherosclerosis 01/12/2010  . IBS 01/12/2010  . HLD (hyperlipidemia)    Past Medical History:  Diagnosis Date  . Anemia    with prev unremarkable heme eval ~2011  . CAD (coronary artery disease)    Dr. Donnie Aho with Cards  . History of gastric ulcer    2005   . Hyperlipidemia   . Hypertension   . IBS (irritable bowel syndrome)   . Leukopenia    with prev unremarkable heme eval ~2011  . Migraine    Unsp w/o intract w/o status migrainosus  . Osteoporosis 11/18/2004  . Shingles   . Trigeminal neuralgia 03/05/2012   Past Surgical History:  Procedure Laterality Date  . ABDOMINAL HYSTERECTOMY  2002  . CORONARY ARTERY BYPASS GRAFT  2002  . LUMBAR SPINE SURGERY    . Stress cardiolite  09/11/2008   Probably normal with breast attenuation noted but no  evidence of ischemia  . TONSILLECTOMY  ~ 1965   Social History   Tobacco Use  . Smoking status: Never Smoker  . Smokeless tobacco: Never Used  Substance Use Topics  . Alcohol use: No    Alcohol/week: 0.0 standard drinks  . Drug use: No   Family History  Problem Relation Age of Onset  . Arthritis Mother   . Diabetes Mother   . Hypertension Mother   . Stroke Father   . Heart disease Father        MI  . Diabetes Father   . Hypertension Father   . Colon cancer Neg Hx   . Breast cancer Neg Hx    Allergies  Allergen Reactions  . Amoxicillin-Pot Clavulanate     REACTION: Rash on tongue  . Azithromycin Other (See Comments)    diarrhea  . Doxycycline     GI intolerance  . Ibandronate Sodium     REACTION: GI side effects  . Iohexol      Desc: HIVES,NASAL  CONGESTION DURING A CARDIAC CATH. REQUIRES PRE-MEDS.   . Raloxifene     REACTION: GI upset  . Sulfonamide Derivatives     REACTION: itching  . Zoledronic Acid     REACTION: unable to take this as she was intolerant of pre-tx calcium and vitamin D   Current Outpatient Medications on File Prior to Visit  Medication Sig Dispense Refill  . amLODipine (NORVASC) 5 MG tablet TAKE 1-2 TABLETS (5-10 MG TOTAL) BY MOUTH DAILY. 60 tablet 5  . aspirin 81 MG tablet Take 81 mg by mouth daily.      Marland Kitchen atorvastatin (LIPITOR) 80 MG tablet TAKE 1 TABLET BY MOUTH EVERY DAY 90 tablet 2  . carbamazepine (TEGRETOL) 200 MG tablet Take 200 mg by mouth 2 (two) times daily.    . cholecalciferol (VITAMIN D) 1000 units tablet Take 1 tab a day as tolerated, goal is a few doses per week    . dexlansoprazole (DEXILANT) 60 MG capsule Take 60 mg by mouth daily.    Marland Kitchen dicyclomine (BENTYL) 10 MG capsule TAKE ONE CAPSULE BY MOUTH 4 TIMES A DAY BEFORE MEALS AND AT BEDTIME 120 capsule 3  . ezetimibe (ZETIA) 10 MG tablet TAKE 1 TABLET BY MOUTH EVERY DAY 30 tablet 5  . levocetirizine (XYZAL) 5 MG tablet TAKE 1 TABLET BY MOUTH EVERY DAY 90 tablet 0  . Melatonin 3 MG TABS Take 1 tablet (3 mg total) by mouth at bedtime as needed.    . metoprolol tartrate (LOPRESSOR) 50 MG tablet TAKE 1 TABLET BY MOUTH TWICE A DAY 180 tablet 3  . spironolactone (ALDACTONE) 25 MG tablet Take 1 tablet (25 mg total) by mouth daily. 90 tablet 1  . valsartan-hydrochlorothiazide (DIOVAN-HCT) 320-25 MG tablet TAKE 1 TABLET BY MOUTH EVERY DAY 90 tablet 0   No current facility-administered medications on file prior to visit.   Review of Systems  Constitutional: Negative for chills, fever and malaise/fatigue.       Fatigue  HENT: Positive for sore throat. Negative for congestion, ear pain and sinus pain.        Runny nose caused by coughing hard  Eyes: Negative for blurred vision, discharge and redness.  Respiratory: Positive for cough. Negative for sputum  production, shortness of breath, wheezing and stridor.   Cardiovascular: Negative for chest pain, palpitations and leg swelling.  Gastrointestinal: Negative for abdominal pain, diarrhea, heartburn, nausea and vomiting.  Musculoskeletal: Negative for myalgias.  Skin: Negative for rash.  Neurological: Negative for dizziness and headaches.      Observations/Objective: Patient appears well, in no distress Weight is baseline  No facial swelling or asymmetry Normal voice-not hoarse and no slurred speech No obvious tremor or mobility impairment Moving neck and UEs normally Able to hear the call well  Frequent dry/hacking cough heard w/o wheeze or sob  Talkative and mentally sharp with no cognitive changes No skin changes on face or neck , no rash or pallor Affect is normal    Assessment and Plan: Problem List Items Addressed This Visit      Respiratory   Acute bronchitis - Primary    2 weeks of uncontrolled cough-getting worse but w/o sob or wheezing  Suspect infectious  Enc her to get tested for covid (she works at [employer] and has access and will call us with result) Isolate, rest , drink fluids  Px cefdinir given length of illness Hycodan for cough  Update if not starting to improve in a week or if worsening  -would consider resp clinic          Follow Up Instructions: Get tested for covid when you get a chance   Have someone pick up the px for hycodan for cough (use with caution of sedation)  I sent in an antibiotic- cefdinir -take as directed (stop and let us know if any side effects) Rest and drink fluids Isolate yourself until symptoms are better    I discussed the assessment and treatment plan with the patient. The patient was provided an opportunity to ask questions and all were answered. The patient agreed with the plan and demonstrated an understanding of the instructions.   The patient was advised to call back or seek an in-person evaluation if the symptoms worsen or  if the condition fails to improve as anticipated.     Loura Pardon, MD

## 2019-03-11 NOTE — Patient Instructions (Signed)
Get tested for covid when you get a chance   Have someone pick up the px for hycodan for cough (use with caution of sedation)  I sent in an antibiotic- cefdinir -take as directed (stop and let us know if any side effects) Rest and drink fluids Isolate yourself until symptoms are better

## 2019-03-15 ENCOUNTER — Telehealth: Payer: Self-pay

## 2019-03-15 MED ORDER — ALBUTEROL SULFATE HFA 108 (90 BASE) MCG/ACT IN AERS: 1.0000 | INHALATION_SPRAY | Freq: Four times a day (QID) | RESPIRATORY_TRACT | 0 refills | Status: DC | PRN

## 2019-03-15 NOTE — Telephone Encounter (Signed)
Karen Saunders - I have to leave emergently- can you take care of this?

## 2019-03-15 NOTE — Telephone Encounter (Signed)
Please see what extra details you can get- if SOB, fever, sputum, etc.  Please let met know and I'll work on it.  Thanks.

## 2019-03-15 NOTE — Telephone Encounter (Signed)
Spoke to pt. She is not having shortness of breath, fever, or coughing anything up. Just a cough.

## 2019-03-15 NOTE — Telephone Encounter (Signed)
Patient advised.

## 2019-03-15 NOTE — Telephone Encounter (Signed)
I would try albuterol.  That in combination with other meds may help.  Thanks.  rx sent.   I realize that he has tried tessalon in the past- I thought that didn't help prev.

## 2019-03-15 NOTE — Telephone Encounter (Signed)
Pt did a virtual visit 03-11-19. She was prescribed cefdinir and Hycodan cough syrup. She states she is not seeing any improvement in the cough. Does not feel any better. She is scheduled for Covid testing this weekend. She is asking what else can be done for the cough.  [facility] [address]

## 2019-03-15 NOTE — Telephone Encounter (Signed)
Left v/m requesting pt to cb. 

## 2019-03-17 ENCOUNTER — Other Ambulatory Visit: Payer: Self-pay | Admitting: Family Medicine

## 2019-03-17 ENCOUNTER — Encounter: Payer: Self-pay | Admitting: Family Medicine

## 2019-03-18 NOTE — Telephone Encounter (Signed)
Please see mychart message about her cough, request coming from patient on refill for cough syrup. This was given By Dr Milinda Antis on 03/11/19 when she was seen for acute visit

## 2019-03-18 NOTE — Telephone Encounter (Signed)
Dr. Milinda Antis out of the office on family emergency, will route to PCP for review

## 2019-03-19 ENCOUNTER — Other Ambulatory Visit: Payer: Self-pay | Admitting: Family Medicine

## 2019-03-19 ENCOUNTER — Other Ambulatory Visit: Payer: Self-pay | Admitting: Interventional Cardiology

## 2019-03-19 DIAGNOSIS — I2510 Atherosclerotic heart disease of native coronary artery without angina pectoris: Secondary | ICD-10-CM

## 2019-03-19 DIAGNOSIS — E782 Mixed hyperlipidemia: Secondary | ICD-10-CM

## 2019-03-19 DIAGNOSIS — I10 Essential (primary) hypertension: Secondary | ICD-10-CM

## 2019-03-19 DIAGNOSIS — Z951 Presence of aortocoronary bypass graft: Secondary | ICD-10-CM

## 2019-03-19 MED ORDER — HYDROCODONE-HOMATROPINE 5-1.5 MG/5ML PO SYRP: 5.0000 mL | ORAL_SOLUTION | Freq: Three times a day (TID) | ORAL | 0 refills | Status: DC | PRN

## 2019-03-19 NOTE — Telephone Encounter (Signed)
Sent. Thanks.   

## 2019-03-19 NOTE — Telephone Encounter (Signed)
Duplication, Epic will not allow me to refuse and close encounter.

## 2019-03-20 NOTE — Telephone Encounter (Signed)
Order removed.  Thanks.  °

## 2019-03-27 ENCOUNTER — Other Ambulatory Visit: Payer: Self-pay | Admitting: Family Medicine

## 2019-06-05 ENCOUNTER — Other Ambulatory Visit: Payer: Self-pay | Admitting: Family Medicine

## 2019-06-10 ENCOUNTER — Other Ambulatory Visit: Payer: Self-pay | Admitting: Interventional Cardiology

## 2019-06-11 NOTE — Progress Notes (Signed)
Cardiology Office Note:    Date:  06/12/2019   ID:  Karen Saunders, DOB 01-28-1955, MRN [id number]  PCP:  Joaquim Nam, MD  Cardiologist:  Lesleigh Noe, MD   Referring MD: Joaquim Nam, MD   Chief Complaint  Patient presents with  . Coronary Artery Disease    History of Present Illness:    Karen Saunders is a 64 y.o. female with a hx of CAD with coronary bypass surgery 2002 (previously followed by Dr. Viann Fish and Dr. Tomie China), hyperlipidemia, hypertension, and history of chronic anemia.  She is followed by Dr. Dwana Curd.  Para March and is referred to establish longitudinal cardiology care at the patient's request.  She is doing well and has no cardiac complaints.  She has not taken valsartan HCTZ in 2 days because she ran out.  We are in the process of switching over responsibility for medication prescriptions.  Past Medical History:  Diagnosis Date  . Anemia    with prev unremarkable heme eval ~2011  . CAD (coronary artery disease)    Dr. Donnie Aho with Cards  . History of gastric ulcer    2005   . Hyperlipidemia   . Hypertension   . IBS (irritable bowel syndrome)   . Leukopenia    with prev unremarkable heme eval ~2011  . Migraine    Unsp w/o intract w/o status migrainosus  . Osteoporosis 11/18/2004  . Shingles   . Trigeminal neuralgia 03/05/2012    Past Surgical History:  Procedure Laterality Date  . ABDOMINAL HYSTERECTOMY  2002  . CORONARY ARTERY BYPASS GRAFT  2002  . LUMBAR SPINE SURGERY    . Stress cardiolite  09/11/2008   Probably normal with breast attenuation noted but no evidence of ischemia  . TONSILLECTOMY  ~ 1965    Current Medications: Current Meds  Medication Sig  . amLODipine (NORVASC) 5 MG tablet TAKE 1-2 TABLETS (5-10 MG TOTAL) BY MOUTH DAILY.  Marland Kitchen aspirin 81 MG tablet Take 81 mg by mouth daily.    Marland Kitchen atorvastatin (LIPITOR) 80 MG tablet TAKE 1 TABLET BY MOUTH EVERY DAY  . dexlansoprazole (DEXILANT) 60 MG capsule Take 60 mg by mouth  daily.  Marland Kitchen dicyclomine (BENTYL) 20 MG tablet Take 20 mg by mouth 4 (four) times daily.  Marland Kitchen ezetimibe (ZETIA) 10 MG tablet TAKE 1 TABLET BY MOUTH EVERY DAY  . levocetirizine (XYZAL) 5 MG tablet TAKE 1 TABLET BY MOUTH EVERY DAY  . Melatonin 3 MG TABS Take 1 tablet (3 mg total) by mouth at bedtime as needed.  . metoprolol tartrate (LOPRESSOR) 50 MG tablet TAKE 1 TABLET BY MOUTH TWICE A DAY  . spironolactone (ALDACTONE) 25 MG tablet TAKE 1 TABLET BY MOUTH EVERY DAY  . valsartan-hydrochlorothiazide (DIOVAN-HCT) 320-25 MG tablet TAKE 1 TABLET BY MOUTH EVERY DAY  . [DISCONTINUED] dicyclomine (BENTYL) 10 MG capsule TAKE ONE CAPSULE BY MOUTH 4 TIMES A DAY BEFORE MEALS AND AT BEDTIME     Allergies:   Amoxicillin-pot clavulanate, Azithromycin, Doxycycline, Ibandronate sodium, Iohexol, Raloxifene, Sulfonamide derivatives, and Zoledronic acid   Social History   Socioeconomic History  . Marital status: Divorced    Spouse name: Not on file  . Number of children: 1  . Years of education: Not on file  . Highest education level: Not on file  Occupational History  . Occupation: MEDICAID SPEC    Employer: [employer]  . 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  Smoking status: Never Smoker  . Smokeless tobacco: Never Used  Substance and Sexual Activity  . Alcohol use: No    Alcohol/week: 0.0 standard drinks  . Drug use: No  . Sexual activity: Not on file  Other Topics Concern  . Not on file  Social History Narrative   From [place].   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     Works at [employer]   Social Determinants of Corporate investment banker Strain:   . Difficulty of Paying Living Expenses:   Food Insecurity:   . Worried About Programme researcher, broadcasting/film/video in the Last Year:   . Barista in the Last Year:   Transportation Needs:   .  Freight forwarder (Medical):   Marland Kitchen Lack of Transportation (Non-Medical):   Physical Activity:   . Days of Exercise per Week:   . Minutes of Exercise per Session:   Stress:   . Feeling of Stress :   Social Connections:   . Frequency of Communication with Friends and Family:   . Frequency of Social Gatherings with Friends and Family:   . Attends Religious Services:   . Active Member of Clubs or Organizations:   . Attends Banker Meetings:   Marland Kitchen Marital Status:      Family History: The patient's family history includes Arthritis in her mother; Diabetes in her father and mother; Heart disease in her father; Hypertension in her father and mother; Stroke in her father. There is no history of Colon cancer or Breast cancer.  ROS:   Please see the history of present illness.    No new data all other systems reviewed and are negative.  EKGs/Labs/Other Studies Reviewed:    The following studies were reviewed today:  2 D Doppler Echocardiogram 11/02/2018: IMPRESSIONS  1. Left ventricular ejection fraction, by visual estimation, is 55 to  60%. The left ventricle has normal function. Normal left ventricular size.  Mildly increased left ventricular posterior wall thickness. There is  mildly increased left ventricular  hypertrophy. Normal wall motion.  2. Left ventricular diastolic Doppler parameters are consistent with  impaired relaxation pattern of LV diastolic filling.  3. Global right ventricle has normal systolic function.The right  ventricular size is normal. No increase in right ventricular wall  thickness.  4. Left atrial size was normal.  5. Right atrial size was normal.  6. The mitral valve is normal in structure. No evidence of mitral valve  regurgitation. No evidence of mitral stenosis.  7. The tricuspid valve is normal in structure. Tricuspid valve  regurgitation is trivial.  8. The aortic valve is tricuspid Aortic valve regurgitation was not    visualized by color flow Doppler. Structurally normal aortic valve, with  no evidence of sclerosis or stenosis.  9. The inferior vena cava is normal in size with greater than 50%  respiratory variability, suggesting right atrial pressure of 3 mmHg.  10. The tricuspid regurgitant velocity is 2.36 m/s, and with an assumed  right atrial pressure of 3 mmHg, the estimated right ventricular systolic  pressure is normal at 25.3 mmHg.   EKG:  EKG sinus bradycardia, 52 bpm, nonspecific T wave flattening, relatively short PR interval.  Since the prior tracing in January 2020, left bundle branch block has resolved.  Recent Labs: No results found for requested labs  within last 8760 hours.  Recent Lipid Panel    Component Value Date/Time   CHOL 185 12/14/2017 1326   TRIG 86.0 12/14/2017 1326   HDL 81.60 12/14/2017 1326   CHOLHDL 2 12/14/2017 1326   VLDL 17.2 12/14/2017 1326   LDLCALC 86 12/14/2017 1326   LDLDIRECT 83.2 11/09/2010 1244    Physical Exam:    VS:  BP (!) 146/82   Pulse (!) 52   Ht 5' 3.5" (1.613 m)   Wt 125 lb (56.7 kg)   SpO2 98%   BMI 21.80 kg/m     Wt Readings from Last 3 Encounters:  06/12/19 125 lb (56.7 kg)  10/24/18 128 lb (58.1 kg)  03/09/18 130 lb (59 kg)     GEN: Slender and healthy appearing. No acute distress HEENT: Normal NECK: No JVD. LYMPHATICS: No lymphadenopathy CARDIAC: Soft scratchy systolic right upper sternal murmur.  RRR without diastolic murmur, gallop, or edema. VASCULAR:  Normal Pulses. No bruits. RESPIRATORY:  Clear to auscultation without rales, wheezing or rhonchi  ABDOMEN: Soft, non-tender, non-distended, No pulsatile mass, MUSCULOSKELETAL: No deformity  SKIN: Warm and dry NEUROLOGIC:  Alert and oriented x 3 PSYCHIATRIC:  Normal affect   ASSESSMENT:    1. Hx of CABG   2. Essential hypertension   3. Mixed hyperlipidemia   4. Left bundle branch block   5. Educated about COVID-19 virus infection    PLAN:    In order of  problems listed above:  1. Secondary prevention discussed in detail.  Continue baby aspirin 81 mg/day.  LV function is normal on the most recent echo. 2. Continue Diovan HCTZ, Aldactone, Lopressor, and Norvasc at current doses.  Target blood pressure 130/80. 3. LDL target less than 70.  Continue high intensity statin therapy.  Dr. Elsie Stain performs lipid assessments.  This will be done later this summer. 4. EKG today demonstrates a left bundle branch block has resolved.  LV function is normal on today's echo. 5. COVID-19 vaccine has been received.  Overall education and awareness concerning secondary risk prevention was discussed in detail: LDL less than 70, hemoglobin A1c less than 7, blood pressure target less than 130/80 mmHg, >150 minutes of moderate aerobic activity per week, avoidance of smoking, weight control (via diet and exercise), and continued surveillance/management of/for obstructive sleep apnea.  Medication Adjustments/Labs and Tests Ordered: Current medicines are reviewed at length with the patient today.  Concerns regarding medicines are outlined above.  No orders of the defined types were placed in this encounter.  No orders of the defined types were placed in this encounter.   There are no Patient Instructions on file for this visit.   Signed, Sinclair Grooms, MD  06/12/2019 1:30 PM    [facility]

## 2019-06-12 ENCOUNTER — Encounter: Payer: Self-pay | Admitting: Interventional Cardiology

## 2019-06-12 ENCOUNTER — Ambulatory Visit (INDEPENDENT_AMBULATORY_CARE_PROVIDER_SITE_OTHER): Payer: Medicare Other | Admitting: Interventional Cardiology

## 2019-06-12 ENCOUNTER — Other Ambulatory Visit: Payer: Self-pay

## 2019-06-12 VITALS — BP 146/82 | HR 52 | Ht 63.5 in | Wt 125.0 lb

## 2019-06-12 DIAGNOSIS — E782 Mixed hyperlipidemia: Secondary | ICD-10-CM | POA: Diagnosis not present

## 2019-06-12 DIAGNOSIS — I10 Essential (primary) hypertension: Secondary | ICD-10-CM

## 2019-06-12 DIAGNOSIS — I447 Left bundle-branch block, unspecified: Secondary | ICD-10-CM

## 2019-06-12 DIAGNOSIS — Z7189 Other specified counseling: Secondary | ICD-10-CM

## 2019-06-12 DIAGNOSIS — Z951 Presence of aortocoronary bypass graft: Secondary | ICD-10-CM

## 2019-06-12 DIAGNOSIS — I2510 Atherosclerotic heart disease of native coronary artery without angina pectoris: Secondary | ICD-10-CM

## 2019-06-12 MED ORDER — EZETIMIBE 10 MG PO TABS: 10.0000 mg | ORAL_TABLET | Freq: Every day | ORAL | 5 refills | Status: DC

## 2019-06-12 MED ORDER — VALSARTAN-HYDROCHLOROTHIAZIDE 320-25 MG PO TABS: 1.0000 | ORAL_TABLET | Freq: Every day | ORAL | 3 refills | Status: DC

## 2019-06-12 MED ORDER — SPIRONOLACTONE 25 MG PO TABS: 25.0000 mg | ORAL_TABLET | Freq: Every day | ORAL | 2 refills | Status: DC

## 2019-06-12 MED ORDER — METOPROLOL TARTRATE 50 MG PO TABS: 50.0000 mg | ORAL_TABLET | Freq: Two times a day (BID) | ORAL | 3 refills | Status: DC

## 2019-06-12 NOTE — Patient Instructions (Signed)
Medication Instructions:  Your physician recommends that you continue on your current medications as directed. Please refer to the Current Medication list given to you today.  *If you need a refill on your cardiac medications before your next appointment, please call your pharmacy*   Lab Work: None If you have labs (blood work) drawn today and your tests are completely normal, you will receive your results only by: Marland Kitchen MyChart Message (if you have MyChart) OR . A paper copy in the mail If you have any lab test that is abnormal or we need to change your treatment, we will call you to review the results.   Testing/Procedures: None   Follow-Up: At [facility], you and your health needs are our priority.  As part of our continuing mission to provide you with exceptional heart care, we have created designated Provider Care Teams.  These Care Teams include your primary Cardiologist (physician) and Advanced Practice Providers (APPs -  Physician Assistants and Nurse Practitioners) who all work together to provide you with the care you need, when you need it.  We recommend signing up for the patient portal called "MyChart".  Sign up information is provided on this After Visit Summary.  MyChart is used to connect with patients for Virtual Visits (Telemedicine).  Patients are able to view lab/test results, encounter notes, upcoming appointments, etc.  Non-urgent messages can be sent to your provider as well.   To learn more about what you can do with MyChart, go to [email or web address].    Your next appointment:   12 month(s)  The format for your next appointment:   In Person  Provider:   You may see Lesleigh Noe, MD or one of the following Advanced Practice Providers on your designated Care Team:    Norma Fredrickson, NP  Nada Boozer, NP  Georgie Chard, NP    Other Instructions  Your LDL (bad) cholesterol should be 70 or lower.  Target blood pressure is 130/80 or lower.

## 2019-06-21 ENCOUNTER — Other Ambulatory Visit: Payer: Self-pay | Admitting: Family Medicine

## 2019-08-20 ENCOUNTER — Other Ambulatory Visit: Payer: Self-pay

## 2019-08-20 ENCOUNTER — Encounter: Payer: Self-pay | Admitting: Family Medicine

## 2019-08-20 ENCOUNTER — Ambulatory Visit (INDEPENDENT_AMBULATORY_CARE_PROVIDER_SITE_OTHER): Payer: Medicare Other | Admitting: Family Medicine

## 2019-08-20 VITALS — BP 140/76 | HR 55 | Temp 97.3°F | Ht 63.5 in | Wt 121.5 lb

## 2019-08-20 DIAGNOSIS — H6990 Unspecified Eustachian tube disorder, unspecified ear: Secondary | ICD-10-CM

## 2019-08-20 DIAGNOSIS — H6980 Other specified disorders of Eustachian tube, unspecified ear: Secondary | ICD-10-CM | POA: Diagnosis not present

## 2019-08-20 MED ORDER — FLUTICASONE PROPIONATE 50 MCG/ACT NA SUSP: 2.0000 | Freq: Every day | NASAL | Status: DC

## 2019-08-20 MED ORDER — AMLODIPINE BESYLATE 5 MG PO TABS: 5.0000 mg | ORAL_TABLET | Freq: Two times a day (BID) | ORAL | Status: DC

## 2019-08-20 NOTE — Progress Notes (Signed)
This visit occurred during the SARS-CoV-2 public health emergency.  Safety protocols were in place, including screening questions prior to the visit, additional usage of staff PPE, and extensive cleaning of exam room while observing appropriate contact time as indicated for disinfecting solutions.  She had covid vaccine.  D/w pt.    L ear pain.  Started about 2 weeks ago.  Present most of the time.  No R ear pain.  No FCNAVD.  She doesn't feel unwell o/w.   Her mother has dementia.  Her mother lives in [place] with the patient's sister.  Patient is going back and forth and helping out.  Condolences offered about her mother's memory loss.  Patient was asking about over-the-counter supplements to try to prevent dementia/memory loss.  Discussed that to my knowledge no over-the-counter supplements are clearly shown to decrease the risk of dementia.  It makes sense to continue with a healthy diet and exercise and continue with brain stimulating activities.  Discussed.  Meds, vitals, and allergies reviewed.   ROS: Per HPI unless specifically indicated in ROS section   nad ncat Neck supple, no lymphadenopathy. rrr ctab Bilateral tympanic membranes without erythema.  Canals and pinnae within normal limits bilaterally.  She cannot get her left eardrum to move on Valsalva.

## 2019-08-20 NOTE — Patient Instructions (Signed)
Gently try to pop your ears and use flonase daily.  This should get better.  Take care.  Glad to see you.

## 2019-08-21 NOTE — Assessment & Plan Note (Signed)
This feels different than her previous ear symptoms.  Discussed.  Likely eustachian tube dysfunction.  Anatomy discussed with patient.  Gently perform Valsalva and use Flonase.  Update me as needed.  She agrees.

## 2019-09-11 ENCOUNTER — Other Ambulatory Visit: Payer: Self-pay

## 2019-09-11 ENCOUNTER — Ambulatory Visit (INDEPENDENT_AMBULATORY_CARE_PROVIDER_SITE_OTHER): Payer: Medicare Other

## 2019-09-11 ENCOUNTER — Encounter: Payer: Self-pay | Admitting: Podiatry

## 2019-09-11 ENCOUNTER — Ambulatory Visit (INDEPENDENT_AMBULATORY_CARE_PROVIDER_SITE_OTHER): Payer: Medicare Other | Admitting: Podiatry

## 2019-09-11 ENCOUNTER — Other Ambulatory Visit: Payer: Self-pay | Admitting: Podiatry

## 2019-09-11 VITALS — Temp 95.5°F

## 2019-09-11 DIAGNOSIS — M778 Other enthesopathies, not elsewhere classified: Secondary | ICD-10-CM

## 2019-09-11 DIAGNOSIS — M779 Enthesopathy, unspecified: Secondary | ICD-10-CM

## 2019-09-11 DIAGNOSIS — M6740 Ganglion, unspecified site: Secondary | ICD-10-CM

## 2019-09-11 DIAGNOSIS — M79672 Pain in left foot: Secondary | ICD-10-CM

## 2019-09-11 DIAGNOSIS — M79671 Pain in right foot: Secondary | ICD-10-CM

## 2019-09-12 NOTE — Progress Notes (Signed)
Subjective:   Patient ID: Karen Saunders, female   DOB: 65 y.o.   MRN: [id number]   HPI Patient presents with a knot on top of the left foot and states it throbs when she lays down at night and some discomfort in the right states that she has noticed it around for a year but it is really gotten worse over the last few weeks and patient does not smoke at this time     ROS      Objective:  Physical Exam  Neurovascular status was found to be intact muscle strength was found to be adequate.  There is found to be a small nodule in the dorsum of the left foot in the midfoot medial side that appears to be very small and measures approximately 5 x 5 mm.  Appears to be within subcutaneous tissue and it may be related to the tendon complex.  The right foot shows mild discomfort but not any of the same type symptoms of the left      Assessment:  Possibility that this could be a ganglionic cyst versus any kind of extensor tendinitis left with possibility for other unknown type of lesion     Plan:  H&P x-rays reviewed.  Today I went ahead and I did a forefoot block left I did try to aspirate this was not able to and I carefully injected some dexamethasone around to try to shrink it and will begin hot pack treatments.  If symptoms were to persist may need to consider excision or other treatment but hopefully this will shrink it and will solve the problem.  Patient will check back and was encouraged to call if it should grow in size turn color or become painful right foot we are going to defer any treatment at this time  X-rays indicated there was some slight increase in soft tissue elevation around the area left but no indications of calcification or bony structural issues bilateral

## 2019-10-18 ENCOUNTER — Encounter: Payer: Self-pay | Admitting: Family Medicine

## 2019-10-18 LAB — LAB REPORT - SCANNED
ALT: 26 (ref 3–30)
AST: 29
Creatinine, Ser: 1.07
Hemoglobin: 10.5
Platelets: 132
TSH: 2.54
WBC: 2

## 2019-11-19 ENCOUNTER — Other Ambulatory Visit: Payer: Self-pay | Admitting: Interventional Cardiology

## 2020-01-02 ENCOUNTER — Other Ambulatory Visit: Payer: Self-pay | Admitting: Family Medicine

## 2020-01-20 ENCOUNTER — Other Ambulatory Visit: Payer: Self-pay | Admitting: Interventional Cardiology

## 2020-01-20 ENCOUNTER — Ambulatory Visit: Payer: Medicare Other | Admitting: Family Medicine

## 2020-01-28 ENCOUNTER — Telehealth (INDEPENDENT_AMBULATORY_CARE_PROVIDER_SITE_OTHER): Payer: Medicare Other | Admitting: Family Medicine

## 2020-01-28 DIAGNOSIS — R300 Dysuria: Secondary | ICD-10-CM | POA: Insufficient documentation

## 2020-01-28 MED ORDER — NITROFURANTOIN MONOHYD MACRO 100 MG PO CAPS: 100.0000 mg | ORAL_CAPSULE | Freq: Two times a day (BID) | ORAL | 0 refills | Status: DC
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
## 2020-02-04 ENCOUNTER — Encounter: Payer: Self-pay | Admitting: Family Medicine

## 2020-02-14 ENCOUNTER — Other Ambulatory Visit: Payer: Self-pay | Admitting: Family Medicine

## 2020-02-14 ENCOUNTER — Other Ambulatory Visit: Payer: Self-pay | Admitting: Interventional Cardiology

## 2020-02-14 DIAGNOSIS — I2510 Atherosclerotic heart disease of native coronary artery without angina pectoris: Secondary | ICD-10-CM

## 2020-02-14 DIAGNOSIS — E782 Mixed hyperlipidemia: Secondary | ICD-10-CM

## 2020-02-14 DIAGNOSIS — Z951 Presence of aortocoronary bypass graft: Secondary | ICD-10-CM

## 2020-02-14 DIAGNOSIS — I10 Essential (primary) hypertension: Secondary | ICD-10-CM

## 2020-02-26 ENCOUNTER — Other Ambulatory Visit: Payer: Self-pay | Admitting: Interventional Cardiology

## 2020-03-25 ENCOUNTER — Ambulatory Visit: Payer: Medicare Other

## 2020-04-09 ENCOUNTER — Ambulatory Visit: Payer: Medicare Other

## 2020-04-09 ENCOUNTER — Other Ambulatory Visit: Payer: Self-pay

## 2020-06-28 ENCOUNTER — Other Ambulatory Visit: Payer: Self-pay | Admitting: Family Medicine

## 2020-07-30 NOTE — Progress Notes (Signed)
Cardiology Office Note:    Date:  07/31/2020   ID:  Karen Saunders, DOB [birth year], MRN [id number]  PCP:  Joaquim Nam, MD  Cardiologist:  Lesleigh Noe, MD   Referring MD: Joaquim Nam, MD   Chief Complaint  Patient presents with   Coronary Artery Disease    History of Present Illness:    Karen Saunders is a 65 y.o. female with a hx of CAD with coronary bypass surgery 2002 (previously followed by Dr. Viann Fish and Dr. Tomie China), hyperlipidemia, hypertension, and history of chronic anemia.  She is followed by Dr. Dwana Curd.  Para March and is referred to establish longitudinal cardiology care at the patient's request.  Patient is doing well.  Previously followed by Dr. Donnie Aho.  She had coronary bypass surgery in 2002.  Her father also had coronary bypass surgery and stroke.  Her mother is now suffering with dementia.  The pandemic and needing to do her share to care for her mother has prevented medical follow-up over the past year and a half.  She denies claudication.  She has not had any neurological symptoms.  She denies angina.  She does have dyspnea with physical activity.  Past Medical History:  Diagnosis Date   Anemia    with prev unremarkable heme eval ~2011   CAD (coronary artery disease)    Dr. Donnie Aho with Cards   History of gastric ulcer    2005    Hyperlipidemia    Hypertension    IBS (irritable bowel syndrome)    Leukopenia    with prev unremarkable heme eval ~2011   Migraine    Unsp w/o intract w/o status migrainosus   Osteoporosis 11/18/2004   Shingles    Trigeminal neuralgia 03/05/2012    Past Surgical History:  Procedure Laterality Date   ABDOMINAL HYSTERECTOMY  2002   CORONARY ARTERY BYPASS GRAFT  2002   LUMBAR SPINE SURGERY     Stress cardiolite  09/11/2008   Probably normal with breast attenuation noted but no evidence of ischemia   TONSILLECTOMY  ~ 1965    Current Medications: Current Meds  Medication Sig   amLODipine (NORVASC) 5 MG  tablet TAKE 1-2 TABLETS (5-10 MG TOTAL) BY MOUTH DAILY.   aspirin 81 MG tablet Take 81 mg by mouth daily.   atorvastatin (LIPITOR) 80 MG tablet TAKE 1 TABLET BY MOUTH EVERY DAY   carbamazepine (TEGRETOL) 200 MG tablet carbamazepine 200 mg tablet  TAKE 1 TABLET IN THE MORNING AND 2 TABLETS AT NIGHT   dexlansoprazole (DEXILANT) 60 MG capsule Take 60 mg by mouth daily.   ezetimibe (ZETIA) 10 MG tablet TAKE 1 TABLET BY MOUTH EVERY DAY   levocetirizine (XYZAL) 5 MG tablet TAKE 1 TABLET BY MOUTH EVERY DAY   Melatonin 3 MG TABS Take 1 tablet (3 mg total) by mouth at bedtime as needed.   metoprolol tartrate (LOPRESSOR) 50 MG tablet Take 1 tablet (50 mg total) by mouth 2 (two) times daily.   RESTASIS 0.05 % ophthalmic emulsion 1 drop 2 (two) times daily.   spironolactone (ALDACTONE) 25 MG tablet TAKE 1 TABLET BY MOUTH EVERY DAY   valsartan-hydrochlorothiazide (DIOVAN-HCT) 320-25 MG tablet Take 1 tablet by mouth daily.     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     Works at [employer]   Social Determinants of Manufacturing engineer Strain: Not on [employer] Insecurity: Not on file  Transportation Needs: Not on file  Physical Activity: Not on file  Stress: Not on file  Social Connections: Not on file     Family History: The patient's family history includes Arthritis in her mother; Diabetes in her father and mother; Heart disease in her father; Hypertension in her father and mother; Stroke in her father. There is no history of Colon cancer or Breast cancer.  ROS:   Please see the history of present illness.    Stress related to the illness of her mother.  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  All other systems reviewed and are negative.  EKGs/Labs/Other Studies Reviewed:    The following studies were reviewed today:  2D Doppler echocardiogram 2020: 1. Left ventricular ejection fraction, by visual estimation, is 55 to  60%. The left ventricle has normal function. Normal left ventricular size.  Mildly increased left ventricular posterior wall thickness. There is  mildly increased left ventricular  hypertrophy. Normal wall motion.   2. Left ventricular diastolic Doppler parameters are consistent with  impaired relaxation pattern of LV diastolic filling.   3. Global right ventricle has normal systolic function.The right  ventricular size is normal. No increase in right ventricular wall  thickness.   4. Left atrial size was normal.   5. Right atrial size was normal.   6. The mitral valve is normal in structure. No evidence of mitral valve  regurgitation. No evidence of mitral stenosis.   7. The tricuspid valve is normal in structure. Tricuspid valve  regurgitation is trivial.   8. The aortic valve is tricuspid Aortic valve regurgitation was not  visualized by color flow Doppler. Structurally normal aortic valve, with  no evidence of sclerosis or stenosis.   9. The inferior vena cava is normal in size with  greater than 50%  respiratory variability, suggesting right atrial pressure of 3 mmHg.  10. The tricuspid regurgitant velocity is 2.36 m/s, and with an assumed  right atrial pressure of 3 mmHg, the estimated right ventricular systolic  pressure is normal at 25.3 mmHg.  EKG:  EKG sinus bradycardia, prominent voltage, flat T waves, and when compared to the tracing from June 2021, no changes noted.  Recent Labs: 09/17/2019: ALT 26; Creatinine, Ser 1.07; Hemoglobin 10.5; Platelets 132; TSH 2.540  Recent Lipid Panel    Component Value Date/Time   CHOL 185 12/14/2017 1326   TRIG 86.0 12/14/2017 1326   HDL 81.60 12/14/2017 1326   CHOLHDL  2 12/14/2017 1326   VLDL 17.2 12/14/2017 1326   LDLCALC 86 12/14/2017 1326   LDLDIRECT 83.2 11/09/2010 1244    Physical Exam:    VS:  BP 112/70   Pulse (!) 50   Ht 5' 3.5" (1.613 m)   Wt 125 lb 9.6 oz (57 kg)   SpO2 97%   BMI 21.90 kg/m     Wt Readings from Last 3 Encounters:  07/31/20 125 lb 9.6 oz (57 kg)  08/20/19 121 lb 8 oz (55.1 kg)  06/12/19 125 lb (56.7 kg)     GEN: Healthy appearin. No acute distress HEENT: Normal NECK: No JVD. LYMPHATICS: No lymphadenopathy CARDIAC: 2-3 over 6 right upper sternal systolic murmur. RRR no gallop, or edema. VASCULAR:  Normal Pulses. No bruits. RESPIRATORY:  Clear to auscultation without rales, wheezing or rhonchi  ABDOMEN: Soft, non-tender, non-distended, No pulsatile mass, MUSCULOSKELETAL: No deformity  SKIN: Warm and dry NEUROLOGIC:  Alert and oriented x 3 PSYCHIATRIC:  Normal affect   ASSESSMENT:    1. Hx of CABG   2. Essential hypertension   3. Mixed hyperlipidemia   4. Left bundle branch block   5. Systolic murmur    PLAN:    In order of problems listed above:  Secondary prevention discussed.  Laboratory data including ACE comprehensive metabolic panel, CBC, lipid panel, hemoglobin A1c will be obtained.  She is not getting the equivalent of 150 minutes of moderate activity per week.   This was strongly encouraged. Continue the current medication regimen which is providing good blood pressure control.  This includes metoprolol tartrate 50 mg twice daily spironolactone 25 mg daily and Diovan HCTZ 320/25 mg/day.  Comprehensive metabolic panel will be obtained. Continue Zetia and Lipitor 80 mg/day.  Lipid panel will be obtained. Not present on today's tracing no was a present on the 2021 tracing. Systolic murmur is audible.  There were no obvious structural abnormalities of the aortic valve when an echo was performed in 2020.  Clinical observation for now.   Medication Adjustments/Labs and Tests Ordered: Current medicines are reviewed at length with the patient today.  Concerns regarding medicines are outlined above.  Orders Placed This Encounter  Procedures   Lipid panel   Hepatic function panel   Basic metabolic panel   HgB A1c   CBC   EKG 12-Lead   No orders of the defined types were placed in this encounter.   Patient Instructions  Medication Instructions:  Your physician recommends that you continue on your current medications as directed. Please refer to the Current Medication list given to you today.  *If you need a refill on your cardiac medications before your next appointment, please call your pharmacy*   Lab Work: BMET, Liver, Lipid, A1C and CBC when you are fasting (nothing to eat or drink after midnight except water and black coffee).   If you have labs (blood work) drawn today and your tests are completely normal, you will receive your results only by: MyChart Message (if you have MyChart) OR A paper copy in the mail If you have any lab test that is abnormal or we need to change your treatment, we will call you to review the results.   Testing/Procedures: None   Follow-Up: At [facility], you and your health needs are our priority.  As part of our continuing mission to provide you with exceptional heart care, we have created designated  Provider Care Teams.  These Care Teams include your primary Cardiologist (physician) and Advanced  Practice Providers (APPs -  Physician Assistants and Nurse Practitioners) who all work together to provide you with the care you need, when you need it.  We recommend signing up for the patient portal called "MyChart".  Sign up information is provided on this After Visit Summary.  MyChart is used to connect with patients for Virtual Visits (Telemedicine).  Patients are able to view lab/test results, encounter notes, upcoming appointments, etc.  Non-urgent messages can be sent to your provider as well.   To learn more about what you can do with MyChart, go to [email or web address].    Your next appointment:   1 year(s)  The format for your next appointment:   In Person  Provider:   You may see Lesleigh Noe, MD or one of the following Advanced Practice Providers on your designated Care Team:   Nada Boozer, NP    Other Instructions     Signed, Lesleigh Noe, MD  07/31/2020 5:03 PM    [facility]

## 2020-07-31 ENCOUNTER — Encounter: Payer: Self-pay | Admitting: Interventional Cardiology

## 2020-07-31 ENCOUNTER — Ambulatory Visit (INDEPENDENT_AMBULATORY_CARE_PROVIDER_SITE_OTHER): Payer: Medicare Other | Admitting: Interventional Cardiology

## 2020-07-31 ENCOUNTER — Other Ambulatory Visit: Payer: Self-pay

## 2020-07-31 VITALS — BP 112/70 | HR 50 | Ht 63.5 in | Wt 125.6 lb

## 2020-07-31 DIAGNOSIS — I10 Essential (primary) hypertension: Secondary | ICD-10-CM

## 2020-07-31 DIAGNOSIS — Z951 Presence of aortocoronary bypass graft: Secondary | ICD-10-CM

## 2020-07-31 DIAGNOSIS — I447 Left bundle-branch block, unspecified: Secondary | ICD-10-CM

## 2020-07-31 DIAGNOSIS — E782 Mixed hyperlipidemia: Secondary | ICD-10-CM

## 2020-07-31 DIAGNOSIS — R011 Cardiac murmur, unspecified: Secondary | ICD-10-CM

## 2020-07-31 NOTE — Patient Instructions (Signed)
Medication Instructions:  Your physician recommends that you continue on your current medications as directed. Please refer to the Current Medication list given to you today.  *If you need a refill on your cardiac medications before your next appointment, please call your pharmacy*   Lab Work: BMET, Liver, Lipid, A1C and CBC when you are fasting (nothing to eat or drink after midnight except water and black coffee).   If you have labs (blood work) drawn today and your tests are completely normal, you will receive your results only by: MyChart Message (if you have MyChart) OR A paper copy in the mail If you have any lab test that is abnormal or we need to change your treatment, we will call you to review the results.   Testing/Procedures: None   Follow-Up: At [facility], you and your health needs are our priority.  As part of our continuing mission to provide you with exceptional heart care, we have created designated Provider Care Teams.  These Care Teams include your primary Cardiologist (physician) and Advanced Practice Providers (APPs -  Physician Assistants and Nurse Practitioners) who all work together to provide you with the care you need, when you need it.  We recommend signing up for the patient portal called "MyChart".  Sign up information is provided on this After Visit Summary.  MyChart is used to connect with patients for Virtual Visits (Telemedicine).  Patients are able to view lab/test results, encounter notes, upcoming appointments, etc.  Non-urgent messages can be sent to your provider as well.   To learn more about what you can do with MyChart, go to [email or web address].    Your next appointment:   1 year(s)  The format for your next appointment:   In Person  Provider:   You may see Lesleigh Noe, MD or one of the following Advanced Practice Providers on your designated Care Team:   Nada Boozer, NP    Other Instructions

## 2020-08-15 ENCOUNTER — Other Ambulatory Visit: Payer: Self-pay | Admitting: Interventional Cardiology

## 2020-08-15 ENCOUNTER — Other Ambulatory Visit: Payer: Self-pay | Admitting: Family Medicine

## 2020-08-15 DIAGNOSIS — Z951 Presence of aortocoronary bypass graft: Secondary | ICD-10-CM

## 2020-08-15 DIAGNOSIS — I10 Essential (primary) hypertension: Secondary | ICD-10-CM

## 2020-08-15 DIAGNOSIS — I2510 Atherosclerotic heart disease of native coronary artery without angina pectoris: Secondary | ICD-10-CM

## 2020-08-15 DIAGNOSIS — E782 Mixed hyperlipidemia: Secondary | ICD-10-CM

## 2020-08-18 ENCOUNTER — Other Ambulatory Visit: Payer: Self-pay

## 2020-08-18 ENCOUNTER — Other Ambulatory Visit: Payer: Medicare Other

## 2020-08-18 DIAGNOSIS — I447 Left bundle-branch block, unspecified: Secondary | ICD-10-CM

## 2020-08-18 DIAGNOSIS — I10 Essential (primary) hypertension: Secondary | ICD-10-CM

## 2020-08-18 DIAGNOSIS — Z951 Presence of aortocoronary bypass graft: Secondary | ICD-10-CM

## 2020-08-18 DIAGNOSIS — R011 Cardiac murmur, unspecified: Secondary | ICD-10-CM

## 2020-08-18 DIAGNOSIS — E782 Mixed hyperlipidemia: Secondary | ICD-10-CM

## 2020-08-18 LAB — CBC
Hematocrit: 28.9 % — ABNORMAL LOW (ref 34.0–46.6)
Hemoglobin: 10 g/dL — ABNORMAL LOW (ref 11.1–15.9)
MCH: 30.9 pg (ref 26.6–33.0)
MCHC: 34.6 g/dL (ref 31.5–35.7)
MCV: 89 fL (ref 79–97)
Platelets: 114 10*3/uL — ABNORMAL LOW (ref 150–450)
RBC: 3.24 x10E6/uL — ABNORMAL LOW (ref 3.77–5.28)
RDW: 12.4 % (ref 11.7–15.4)
WBC: 1.8 10*3/uL — CL (ref 3.4–10.8)

## 2020-08-18 LAB — BASIC METABOLIC PANEL
BUN/Creatinine Ratio: 18 (ref 12–28)
BUN: 22 mg/dL (ref 8–27)
CO2: 23 mmol/L (ref 20–29)
Calcium: 9.5 mg/dL (ref 8.7–10.3)
Chloride: 102 mmol/L (ref 96–106)
Creatinine, Ser: 1.23 mg/dL — ABNORMAL HIGH (ref 0.57–1.00)
Glucose: 100 mg/dL — ABNORMAL HIGH (ref 65–99)
Potassium: 4.3 mmol/L (ref 3.5–5.2)
Sodium: 140 mmol/L (ref 134–144)
eGFR: 48 mL/min/{1.73_m2} — ABNORMAL LOW (ref >59)

## 2020-08-18 LAB — HEMOGLOBIN A1C
Est. average glucose Bld gHb Est-mCnc: 120 mg/dL
Hgb A1c MFr Bld: 5.8 % — ABNORMAL HIGH (ref 4.8–5.6)

## 2020-08-18 LAB — HEPATIC FUNCTION PANEL
ALT: 21 IU/L (ref 0–32)
AST: 25 IU/L (ref 0–40)
Albumin: 4.6 g/dL (ref 3.8–4.8)
Alkaline Phosphatase: 83 IU/L (ref 44–121)
Bilirubin Total: <0.2 mg/dL (ref 0.0–1.2)
Bilirubin, Direct: <0.1 mg/dL (ref 0.00–0.40)
Total Protein: 6.3 g/dL (ref 6.0–8.5)

## 2020-08-18 LAB — LIPID PANEL
Chol/HDL Ratio: 2.4 ratio (ref 0.0–4.4)
Cholesterol, Total: 160 mg/dL (ref 100–199)
HDL: 67 mg/dL (ref >39)
LDL Chol Calc (NIH): 80 mg/dL (ref 0–99)
Triglycerides: 66 mg/dL (ref 0–149)
VLDL Cholesterol Cal: 13 mg/dL (ref 5–40)

## 2020-08-25 LAB — HM MAMMOGRAPHY: HM Mammogram: NORMAL (ref 0–4)

## 2020-09-24 ENCOUNTER — Other Ambulatory Visit: Payer: Self-pay | Admitting: Interventional Cardiology

## 2020-09-24 ENCOUNTER — Other Ambulatory Visit: Payer: Self-pay | Admitting: Family Medicine

## 2020-10-26 ENCOUNTER — Other Ambulatory Visit: Payer: Self-pay | Admitting: Interventional Cardiology

## 2020-10-27 LAB — HEPATIC FUNCTION PANEL
ALT: 19 (ref 7–35)
AST: 25 (ref 13–35)
Alkaline Phosphatase: 87 (ref 25–125)

## 2020-10-27 LAB — BASIC METABOLIC PANEL
Creatinine: 1.2 — AB (ref <=1.1)
Glucose: 88
Potassium: 9.3 — AB (ref 3.4–5.3)
Sodium: 5 — AB (ref 137–147)

## 2020-10-27 LAB — TSH: TSH: 1.8 (ref <=5.90)

## 2020-10-27 LAB — CBC AND DIFFERENTIAL
Hemoglobin: 10.3 — AB (ref 12.0–16.0)
WBC: 1.8

## 2020-10-27 LAB — COMPREHENSIVE METABOLIC PANEL: Albumin: 4.4 (ref 3.5–5.0)

## 2020-10-29 ENCOUNTER — Other Ambulatory Visit: Payer: Self-pay | Admitting: Family Medicine

## 2020-10-29 NOTE — Telephone Encounter (Signed)
Patient is due for yearly visit. Please call to schedule.

## 2020-11-10 ENCOUNTER — Encounter: Payer: Self-pay | Admitting: Family Medicine

## 2020-11-10 ENCOUNTER — Telehealth (INDEPENDENT_AMBULATORY_CARE_PROVIDER_SITE_OTHER): Payer: Medicare Other | Admitting: Nurse Practitioner

## 2020-11-10 VITALS — BP 151/100 | HR 53

## 2020-11-10 DIAGNOSIS — U071 COVID-19: Secondary | ICD-10-CM | POA: Diagnosis not present

## 2020-11-10 MED ORDER — MOLNUPIRAVIR EUA 200MG CAPSULE: 4.0000 | ORAL_CAPSULE | Freq: Two times a day (BID) | ORAL | 0 refills | Status: AC

## 2020-11-10 NOTE — Progress Notes (Signed)
Patient ID: Karen Saunders, female    DOB: [birth year], 65 y.o.   MRN: [id number]  Virtual visit completed through [employer], a video enabled telemedicine application. Due to national recommendations of social distancing due to COVID-19, a virtual visit is felt to be most appropriate for this patient at this time. Reviewed limitations, risks, security and privacy concerns of performing a virtual visit and the availability of in person appointments. I also reviewed that there may be a patient responsible charge related to this service. The patient agreed to proceed.   Patient location: home Provider location:  at [facility], office Persons participating in this virtual visit: patient, provider   If any vitals were documented, they were collected by patient at home unless specified below.    BP (!) 151/100   Pulse (!) 53    CC: Covid 19 Subjective:   HPI: Karen Saunders is a 65 y.o. female presenting on 11/10/2020 for Covid Positive (On 11/09/20, sx started on 11/08/20-Patient has had nausea, HA, sore throat, cough, fatigue and head congestion.)   Symptoms on 11/08/2020 Tested at [facility], was positive  Vaccinated pfizer x2 Treid tylenol cold and flu, mucinex, and [facility] cold medicine. Not much releif      Relevant past medical, surgical, family and social history reviewed and updated as indicated. Interim medical history since our last visit reviewed. Allergies and medications reviewed and updated. Outpatient Medications Prior to Visit  Medication Sig Dispense Refill   amLODipine (NORVASC) 5 MG tablet TAKE 1-2 TABLETS (5-10 MG TOTAL) BY MOUTH DAILY. 60 tablet 0   aspirin 81 MG tablet Take 81 mg by mouth daily.     atorvastatin (LIPITOR) 80 MG tablet TAKE 1 TABLET BY MOUTH EVERY DAY 90 tablet 1   carbamazepine (TEGRETOL) 200 MG tablet carbamazepine 200 mg tablet  TAKE 1 TABLET IN THE MORNING AND 2 TABLETS AT NIGHT     dexlansoprazole (DEXILANT) 60 MG capsule Take  60 mg by mouth daily.     ezetimibe (ZETIA) 10 MG tablet TAKE 1 TABLET BY MOUTH EVERY DAY 30 tablet 11   levocetirizine (XYZAL) 5 MG tablet TAKE 1 TABLET BY MOUTH EVERY DAY 90 tablet 0   Melatonin 3 MG TABS Take 1 tablet (3 mg total) by mouth at bedtime as needed.     metoprolol tartrate (LOPRESSOR) 50 MG tablet TAKE 1 TABLET BY MOUTH TWICE A DAY 180 tablet 3   spironolactone (ALDACTONE) 25 MG tablet TAKE 1 TABLET BY MOUTH EVERY DAY 90 tablet 3   valsartan-hydrochlorothiazide (DIOVAN-HCT) 320-25 MG tablet TAKE 1 TABLET BY MOUTH EVERY DAY 90 tablet 3   fluticasone (FLONASE) 50 MCG/ACT nasal spray Place 2 sprays into both nostrils daily. (Patient not taking: Reported on 07/31/2020)     nitrofurantoin, macrocrystal-monohydrate, (MACROBID) 100 MG capsule Take 1 capsule (100 mg total) by mouth 2 (two) times daily. (Patient not taking: Reported on 07/31/2020) 14 capsule 0   RESTASIS 0.05 % ophthalmic emulsion 1 drop 2 (two) times daily.     No facility-administered medications prior to visit.     Per HPI unless specifically indicated in ROS section below Review of Systems  Constitutional:  Positive for fatigue. Negative for chills and fever.  HENT:  Positive for congestion, rhinorrhea and sinus pressure. Negative for postnasal drip and sore throat.   Respiratory:  Positive for cough. Negative for shortness of breath.   Cardiovascular:  Negative for chest pain.  Gastrointestinal:  Positive for nausea. Negative for  abdominal pain, diarrhea and vomiting.  Musculoskeletal:  Negative for arthralgias and myalgias.  Objective:  BP (!) 151/100   Pulse (!) 53   Wt Readings from Last 3 Encounters:  07/31/20 125 lb 9.6 oz (57 kg)  08/20/19 121 lb 8 oz (55.1 kg)  06/12/19 125 lb (56.7 kg)       Physical exam: Gen: alert, NAD, not ill appearing Pulm: speaks in complete sentences without increased work of breathing Psych: normal mood, normal thought content      Results for orders placed or performed  in visit on 08/18/20  Lipid panel  Result Value Ref Range   Cholesterol, Total 160 100 - 199 mg/dL   Triglycerides 66 0 - 149 mg/dL   HDL 67 >39 mg/dL   VLDL Cholesterol Cal 13 5 - 40 mg/dL   LDL Chol Calc (NIH) 80 0 - 99 mg/dL   Chol/HDL Ratio 2.4 0.0 - 4.4 ratio  Hepatic function panel  Result Value Ref Range   Total Protein 6.3 6.0 - 8.5 g/dL   Albumin 4.6 3.8 - 4.8 g/dL   Bilirubin Total <0.2 0.0 - 1.2 mg/dL   Bilirubin, Direct <0.10 0.00 - 0.40 mg/dL   Alkaline Phosphatase 83 44 - 121 IU/L   AST 25 0 - 40 IU/L   ALT 21 0 - 32 IU/L  Basic metabolic panel  Result Value Ref Range   Glucose 100 (H) 65 - 99 mg/dL   BUN 22 8 - 27 mg/dL   Creatinine, Ser 1.23 (H) 0.57 - 1.00 mg/dL   eGFR 48 (L) >59 mL/min/1.73   BUN/Creatinine Ratio 18 12 - 28   Sodium 140 134 - 144 mmol/L   Potassium 4.3 3.5 - 5.2 mmol/L   Chloride 102 96 - 106 mmol/L   CO2 23 20 - 29 mmol/L   Calcium 9.5 8.7 - 10.3 mg/dL  HgB A1c  Result Value Ref Range   Hgb A1c MFr Bld 5.8 (H) 4.8 - 5.6 %   Est. average glucose Bld gHb Est-mCnc 120 mg/dL  CBC  Result Value Ref Range   WBC 1.8 (LL) 3.4 - 10.8 x10E3/uL   RBC 3.24 (L) 3.77 - 5.28 x10E6/uL   Hemoglobin 10.0 (L) 11.1 - 15.9 g/dL   Hematocrit 28.9 (L) 34.0 - 46.6 %   MCV 89 79 - 97 fL   MCH 30.9 26.6 - 33.0 pg   MCHC 34.6 31.5 - 35.7 g/dL   RDW 12.4 11.7 - 15.4 %   Platelets 114 (L) 150 - 450 x10E3/uL   Assessment & Plan:   Problem List Items Addressed This Visit       Other   COVID-19 - Primary    Was tested at her place of employment.  Came back positive for COVID.  Giving age comorbidities will elect treatment.  Did discuss treatment options with patient and that they are emergency use authorized only.  After discussion and joint decision-making we will pursue molnupiravir.  Patient agreed with that did discuss side effects inclusive of GI upset, taste disturbance, and fertility issues.  Did discuss CDC guidelines regarding quarantine.  Also  discussed signs and symptoms as when she needs to be seen urgently or emergently.  Patient acknowledged all this. Start soon molnupiravir as possible      Relevant Medications   molnupiravir EUA (LAGEVRIO) 200 mg CAPS capsule     No orders of the defined types were placed in this encounter.  No orders of the defined types were placed  in this encounter.   I discussed the assessment and treatment plan with the patient. The patient was provided an opportunity to ask questions and all were answered. The patient agreed with the plan and demonstrated an understanding of the instructions. The patient was advised to call back or seek an in-person evaluation if the symptoms worsen or if the condition fails to improve as anticipated.  Follow up plan: No follow-ups on file.  Romilda Garret, NP

## 2020-11-10 NOTE — Telephone Encounter (Signed)
Called and spoke with patient. Walk in clinic would not treat patient but did see her and do covid test which was positive yesterday. I advised patient would need to do VV in order for medication to be prescribed if needed. Patient has had nausea, HA, sore throat, cough, fatigue and congestion since Sunday. She has been taking otc cold and flu medications for sx. Scheduled VV with Audria Nine, NP today at 11:00 pm.

## 2020-11-10 NOTE — Assessment & Plan Note (Signed)
Was tested at her place of employment.  Came back positive for COVID.  Giving age comorbidities will elect treatment.  Did discuss treatment options with patient and that they are emergency use authorized only.  After discussion and joint decision-making we will pursue molnupiravir.  Patient agreed with that did discuss side effects inclusive of GI upset, taste disturbance, and fertility issues.  Did discuss CDC guidelines regarding quarantine.  Also discussed signs and symptoms as when she needs to be seen urgently or emergently.  Patient acknowledged all this. Start soon molnupiravir as possible

## 2020-11-11 ENCOUNTER — Encounter: Payer: Self-pay | Admitting: Nurse Practitioner

## 2020-11-11 ENCOUNTER — Other Ambulatory Visit: Payer: Self-pay | Admitting: Nurse Practitioner

## 2020-11-11 DIAGNOSIS — R051 Acute cough: Secondary | ICD-10-CM

## 2020-11-11 MED ORDER — GUAIFENESIN-CODEINE 100-10 MG/5ML PO SOLN: 5.0000 mL | Freq: Two times a day (BID) | ORAL | 0 refills | Status: AC | PRN

## 2020-11-12 ENCOUNTER — Other Ambulatory Visit: Payer: Self-pay | Admitting: Nurse Practitioner

## 2020-11-12 DIAGNOSIS — R112 Nausea with vomiting, unspecified: Secondary | ICD-10-CM

## 2020-11-12 MED ORDER — ONDANSETRON 4 MG PO TBDP: 4.0000 mg | ORAL_TABLET | Freq: Two times a day (BID) | ORAL | 0 refills | Status: AC | PRN

## 2020-11-12 NOTE — Telephone Encounter (Signed)
Sent some zofran into her pharmacy

## 2020-11-12 NOTE — Telephone Encounter (Signed)
Spoke with patient to get more information. Patient states started to vomit today, had 2 episodes of this. Last episode was about 15 to 20 minutes ago but not sure when the first time it was this morning. She is not eating anything heavy, drinks fluids-gingerale. She is able to keep some fluid down. When she did vomit described consistency as yellow liquid. She does have IBS. She had more frequent bowel movements yesterday but that is normal for her but no diarrhea. She has some abdominal cramping from vomiting. Patient denies any of the following symptoms chills, body aches, fever, vomiting, blurred vision or dizziness.

## 2020-11-17 LAB — BASIC METABOLIC PANEL
Potassium: 4.5 (ref 3.4–5.3)
Sodium: 141 (ref 137–147)

## 2020-11-18 ENCOUNTER — Other Ambulatory Visit: Payer: Self-pay | Admitting: Family Medicine

## 2020-11-25 ENCOUNTER — Other Ambulatory Visit: Payer: Self-pay | Admitting: Family Medicine

## 2020-12-07 ENCOUNTER — Other Ambulatory Visit: Payer: Self-pay

## 2020-12-07 ENCOUNTER — Ambulatory Visit (INDEPENDENT_AMBULATORY_CARE_PROVIDER_SITE_OTHER): Payer: Medicare Other | Admitting: Family Medicine

## 2020-12-07 ENCOUNTER — Encounter: Payer: Self-pay | Admitting: Family Medicine

## 2020-12-07 VITALS — BP 136/80 | HR 55 | Temp 98.3°F | Ht 63.5 in | Wt 123.0 lb

## 2020-12-07 DIAGNOSIS — E782 Mixed hyperlipidemia: Secondary | ICD-10-CM

## 2020-12-07 DIAGNOSIS — Z0000 Encounter for general adult medical examination without abnormal findings: Secondary | ICD-10-CM | POA: Diagnosis not present

## 2020-12-07 DIAGNOSIS — I2510 Atherosclerotic heart disease of native coronary artery without angina pectoris: Secondary | ICD-10-CM

## 2020-12-07 DIAGNOSIS — M810 Age-related osteoporosis without current pathological fracture: Secondary | ICD-10-CM

## 2020-12-07 DIAGNOSIS — Z23 Encounter for immunization: Secondary | ICD-10-CM

## 2020-12-07 DIAGNOSIS — H9319 Tinnitus, unspecified ear: Secondary | ICD-10-CM

## 2020-12-07 DIAGNOSIS — Z7189 Other specified counseling: Secondary | ICD-10-CM

## 2020-12-07 LAB — VITAMIN D 25 HYDROXY (VIT D DEFICIENCY, FRACTURES): VITD: 67.52 ng/mL (ref 30.00–100.00)

## 2020-12-07 MED ORDER — LEVOCETIRIZINE DIHYDROCHLORIDE 5 MG PO TABS: 5.0000 mg | ORAL_TABLET | Freq: Every day | ORAL | 12 refills | Status: DC

## 2020-12-07 MED ORDER — AMLODIPINE BESYLATE 5 MG PO TABS: 5.0000 mg | ORAL_TABLET | Freq: Every day | ORAL | 12 refills | Status: DC

## 2020-12-07 NOTE — Progress Notes (Signed)
This visit occurred during the SARS-CoV-2 public health emergency.  Safety protocols were in place, including screening questions prior to the visit, additional usage of staff PPE, and extensive cleaning of exam room while observing appropriate contact time as indicated for disinfecting solutions.  I have personally reviewed the Medicare Annual Wellness questionnaire and have noted 1. The patient's medical and social history 2. Their use of alcohol, tobacco or illicit drugs 3. Their current medications and supplements 4. The patient's functional ability including ADL's, fall risks, home safety risks and hearing or visual             impairment. 5. Diet and physical activities 6. Evidence for depression or mood disorders  The patients weight, height, BMI have been recorded in the chart and visual acuity is per eye clinic.  I have made referrals, counseling and provided education to the patient based review of the above and I have provided the pt with a written personalized care plan for preventive services.  Provider list updated- see scanned forms.  Routine anticipatory guidance given to patient.  See health maintenance. The possibility exists that previously documented standard health maintenance information may have been brought forward from a previous encounter into this note.  If needed, that same information has been updated to reflect the current situation based on today's encounter.    Flu 2022 Shingles discussed with patient PNA updated 2022 Tetanus 2010, discussed with patient COVID-vaccine previously done Colonoscopy 2013 Mammogram 2022. Bone density test deferred until recheck vitamin D Living will d/w pt.  Would have her sister Lura Em and her son Casimiro Needle equally designated if patient were incapacitated.  Cognitive function addressed- see scanned forms- and if abnormal then additional documentation follows.   In addition to [facility] Wellness, follow up visit for the below  conditions:  Hypertension/CAD.  Using medication without problems or lightheadedness: yes Chest pain with exertion:no Edema:no Short of breath:no  Elevated Cholesterol: Using medications without problems: yes Muscle aches: no Diet compliance: yes Exercise: yes, walking.    H/o ear clicks on carbamazepine.  It helped.    Her mother has dementia and pt is still caring for her, her sisters are helping out.  Support offered.  PMH and SH reviewed  Meds, vitals, and allergies reviewed.   ROS: Per HPI.  Unless specifically indicated otherwise in HPI, the patient denies:  General: fever. Eyes: acute vision changes ENT: sore throat Cardiovascular: chest pain Respiratory: SOB GI: vomiting GU: dysuria Musculoskeletal: acute back pain Derm: acute rash Neuro: acute motor dysfunction Psych: worsening mood Endocrine: polydipsia Heme: bleeding Allergy: hayfever  GEN: nad, alert and oriented HEENT: ncat NECK: supple w/o LA CV: rrr. PULM: ctab, no inc wob ABD: soft, +bs EXT: no edema SKIN: no acute rash

## 2020-12-07 NOTE — Patient Instructions (Addendum)
Go to the lab on the way out.   If you have mychart we'll likely use that to update you.    Take care.  Glad to see you. 

## 2020-12-09 NOTE — Assessment & Plan Note (Signed)
Continue Lipitor and Zetia.  Previous labs discussed with patient.

## 2020-12-09 NOTE — Assessment & Plan Note (Signed)
Living will d/w pt.  Would have her sister Karen Saunders and her son Karen Saunders equally designated if patient were incapacitated.

## 2020-12-09 NOTE — Assessment & Plan Note (Signed)
Improved on carbamazepine per ENT.  I will defer.  She agrees.

## 2020-12-09 NOTE — Assessment & Plan Note (Signed)
Continue amlodipine aspirin Lipitor and Zetia.  Continue metoprolol spironolactone and valsartan hydrochlorothiazide.  Previous labs discussed with patient.

## 2020-12-09 NOTE — Assessment & Plan Note (Signed)
Flu 2022 Shingles discussed with patient PNA updated 2022 Tetanus 2010, discussed with patient COVID-vaccine previously done Colonoscopy 2013 Mammogram 2022. Bone density test deferred until recheck vitamin D Living will d/w pt.  Would have her sister Lura Em and her son Casimiro Needle equally designated if patient were incapacitated.  Cognitive function addressed- see scanned forms- and if abnormal then additional documentation follows.

## 2020-12-13 ENCOUNTER — Other Ambulatory Visit: Payer: Self-pay | Admitting: Family Medicine

## 2020-12-13 DIAGNOSIS — M810 Age-related osteoporosis without current pathological fracture: Secondary | ICD-10-CM

## 2020-12-21 ENCOUNTER — Other Ambulatory Visit: Payer: Self-pay | Admitting: Family Medicine

## 2021-01-10 ENCOUNTER — Other Ambulatory Visit: Payer: Self-pay | Admitting: Interventional Cardiology

## 2021-01-26 ENCOUNTER — Other Ambulatory Visit: Payer: Self-pay | Admitting: Interventional Cardiology

## 2021-05-07 ENCOUNTER — Telehealth: Payer: Self-pay

## 2021-05-07 ENCOUNTER — Emergency Department (HOSPITAL_BASED_OUTPATIENT_CLINIC_OR_DEPARTMENT_OTHER): Payer: Medicare Other

## 2021-05-07 ENCOUNTER — Encounter (HOSPITAL_BASED_OUTPATIENT_CLINIC_OR_DEPARTMENT_OTHER): Payer: Self-pay | Admitting: Emergency Medicine

## 2021-05-07 ENCOUNTER — Other Ambulatory Visit: Payer: Self-pay

## 2021-05-07 ENCOUNTER — Emergency Department (HOSPITAL_BASED_OUTPATIENT_CLINIC_OR_DEPARTMENT_OTHER)
Admission: EM | Admit: 2021-05-07 | Discharge: 2021-05-07 | Disposition: A | Discharge status: Home / Self Care | Payer: Medicare Other | Attending: Emergency Medicine | Admitting: Emergency Medicine

## 2021-05-07 DIAGNOSIS — R1033 Periumbilical pain: Secondary | ICD-10-CM | POA: Diagnosis present

## 2021-05-07 DIAGNOSIS — K529 Noninfective gastroenteritis and colitis, unspecified: Secondary | ICD-10-CM | POA: Diagnosis not present

## 2021-05-07 DIAGNOSIS — Z7982 Long term (current) use of aspirin: Secondary | ICD-10-CM | POA: Insufficient documentation

## 2021-05-07 DIAGNOSIS — Z79899 Other long term (current) drug therapy: Secondary | ICD-10-CM | POA: Diagnosis not present

## 2021-05-07 LAB — COMPREHENSIVE METABOLIC PANEL
ALT: 21 U/L (ref 0–44)
AST: 24 U/L (ref 15–41)
Albumin: 4.6 g/dL (ref 3.5–5.0)
Alkaline Phosphatase: 80 U/L (ref 38–126)
Anion gap: 10 (ref 5–15)
BUN: 19 mg/dL (ref 8–23)
CO2: 23 mmol/L (ref 22–32)
Calcium: 10 mg/dL (ref 8.9–10.3)
Chloride: 97 mmol/L — ABNORMAL LOW (ref 98–111)
Creatinine, Ser: 0.97 mg/dL (ref 0.44–1.00)
GFR, Estimated: >60 mL/min (ref >60)
Glucose, Bld: 106 mg/dL — ABNORMAL HIGH (ref 70–99)
Potassium: 4.5 mmol/L (ref 3.5–5.1)
Sodium: 130 mmol/L — ABNORMAL LOW (ref 135–145)
Total Bilirubin: 0.3 mg/dL (ref 0.3–1.2)
Total Protein: 7.1 g/dL (ref 6.5–8.1)

## 2021-05-07 LAB — URINALYSIS, ROUTINE W REFLEX MICROSCOPIC
Bilirubin Urine: NEGATIVE
Glucose, UA: NEGATIVE mg/dL
Hgb urine dipstick: NEGATIVE
Ketones, ur: NEGATIVE mg/dL
Leukocytes,Ua: NEGATIVE
Nitrite: NEGATIVE
Protein, ur: 30 mg/dL — AB
Specific Gravity, Urine: 1.02 (ref 1.005–1.030)
pH: 6 (ref 5.0–8.0)

## 2021-05-07 LAB — URINALYSIS, MICROSCOPIC (REFLEX)
Bacteria, UA: NONE SEEN
RBC / HPF: NONE SEEN RBC/hpf (ref 0–5)
WBC, UA: NONE SEEN WBC/hpf (ref 0–5)

## 2021-05-07 LAB — OCCULT BLOOD X 1 CARD TO LAB, STOOL: Fecal Occult Bld: NEGATIVE

## 2021-05-07 LAB — CBC
HCT: 31.2 % — ABNORMAL LOW (ref 36.0–46.0)
Hemoglobin: 10.8 g/dL — ABNORMAL LOW (ref 12.0–15.0)
MCH: 30.8 pg (ref 26.0–34.0)
MCHC: 34.6 g/dL (ref 30.0–36.0)
MCV: 88.9 fL (ref 80.0–100.0)
Platelets: 142 10*3/uL — ABNORMAL LOW (ref 150–400)
RBC: 3.51 MIL/uL — ABNORMAL LOW (ref 3.87–5.11)
RDW: 12.4 % (ref 11.5–15.5)
WBC: 3.2 10*3/uL — ABNORMAL LOW (ref 4.0–10.5)
nRBC: 0 % (ref 0.0–0.2)

## 2021-05-07 LAB — LIPASE, BLOOD: Lipase: 34 U/L (ref 11–51)

## 2021-05-07 MED ORDER — AZITHROMYCIN 500 MG PO TABS
ORAL_TABLET | ORAL | 0 refills | Status: DC
Start: 2021-05-07 — End: 2021-05-08

## 2021-05-07 MED ORDER — ONDANSETRON HCL 4 MG PO TABS: 4.0000 mg | ORAL_TABLET | Freq: Four times a day (QID) | ORAL | 0 refills | Status: AC

## 2021-05-07 MED ORDER — SODIUM CHLORIDE 0.9 % IV BOLUS
1000.0000 mL | Freq: Once | INTRAVENOUS | Status: AC
  Administered 2021-05-07: 1000 mL via INTRAVENOUS

## 2021-05-07 MED ORDER — OXYCODONE-ACETAMINOPHEN 5-325 MG PO TABS: 1.0000 | ORAL_TABLET | Freq: Four times a day (QID) | ORAL | 0 refills | Status: DC | PRN

## 2021-05-07 MED ORDER — MORPHINE SULFATE (PF) 4 MG/ML IV SOLN
4.0000 mg | Freq: Once | INTRAVENOUS | Status: AC
  Administered 2021-05-07: 4 mg via INTRAVENOUS
  Filled 2021-05-07: qty 1

## 2021-05-07 MED ORDER — ONDANSETRON HCL 4 MG/2ML IJ SOLN
4.0000 mg | Freq: Once | INTRAMUSCULAR | Status: AC
  Administered 2021-05-07: 4 mg via INTRAVENOUS
  Filled 2021-05-07: qty 2

## 2021-05-07 MED ORDER — DICYCLOMINE HCL 10 MG PO CAPS
10.0000 mg | ORAL_CAPSULE | Freq: Once | ORAL | Status: AC
  Administered 2021-05-07: 10 mg via ORAL
  Filled 2021-05-07: qty 1

## 2021-05-07 MED ORDER — DICYCLOMINE HCL 20 MG PO TABS: 20.0000 mg | ORAL_TABLET | Freq: Four times a day (QID) | ORAL | 0 refills | Status: DC | PRN

## 2021-05-07 MED ORDER — ONDANSETRON HCL 4 MG/2ML IJ SOLN
4.0000 mg | Freq: Once | INTRAMUSCULAR | Status: AC
Start: 2021-05-07 — End: 2021-05-07
  Administered 2021-05-07: 4 mg via INTRAVENOUS
  Filled 2021-05-07: qty 2

## 2021-05-07 NOTE — Telephone Encounter (Signed)
Agree with ER eval.  Thanks.  

## 2021-05-07 NOTE — Telephone Encounter (Signed)
[facility] Day - Client ?TELEPHONE ADVICE RECORD ?AccessNurse? ?Patient ?Name: ?Karen BUR ?Saunders ?Gender: Female ?DOB: 06-Aug-1955 ?Age: 66 Y 6 M 19 D ?Return ?Phone ?Number: ?[id number] ?(Primary) ?Address: ?City/ ?State/ ?Zip: ?[place] ? [address] ?Client [facility] Day - Client ?Client Site [facility] - Day ?Provider Raechel Ache - MD ?Contact Type Call ?Who Is Calling Patient / Member / Family / Caregiver ?Call Type Triage / Clinical ?Relationship To Patient Self ?Return Phone Number [phone] (Primary) ?Chief Complaint Blood In Stool ?Reason for Call Symptomatic / Request for Health Information ?Initial Comment Caller states she is having frequent bowel ?movements with blood in her stool and abdominal ?pain. ?Translation No ?Nurse Assessment ?Nurse: Risa Grill, RN, Lambert Mody Date/Time Lamount Cohen Time): 05/07/2021 12:31:43 PM ?Confirm and document reason for call. If ?symptomatic, describe symptoms. ?---Caller states she is having frequent bowel ?movements with blood in her stool and abdominal ?cramping, vomiting at 0300. ?Does the patient have any new or worsening ?symptoms? ---Yes ?Will a triage be completed? ---Yes ?Related visit to physician within the last 2 weeks? ---No ?Does the PT have any chronic conditions? (i.e. ?diabetes, asthma, this includes High risk factors for ?pregnancy, etc.) ?---Yes ?List chronic conditions. ---IBS, HTN ?Is this a behavioral health or substance abuse call? ---No ?Guidelines ?Guideline Title Affirmed Question Affirmed Notes Nurse Date/Time (Eastern ?Time) ?Rectal Bleeding SEVERE rectal ?bleeding (large blood ?clots; constant or on ?and off bleeding) ?Risa Grill, RN, Tracie 05/07/2021 12:33:04 ?PM ?Disp. Time (Eastern ?Time) Disposition Final User ?05/07/2021 12:33:49 PM Go to ED Now Yes Risa Grill, RN, Lambert Mody ?PLEASE NOTE: All timestamps contained within this report are represented as [place] Standard  Time. ?CONFIDENTIALTY NOTICE: This fax transmission is intended only for the addressee. It contains information that is legally privileged, confidential or ?otherwise protected from use or disclosure. If you are not the intended recipient, you are strictly prohibited from reviewing, disclosing, copying using ?or disseminating any of this information or taking any action in reliance on or regarding this information. If you have received this fax in error, please ?notify us immediately by telephone so that we can arrange for its return to [place]. Phone: [phone], Toll-Free: [phone], Fax: [phone] ?Page: 2 of 2 ?Call Id: [id number] ?Caller Disagree/Comply Comply ?Caller Understands Yes ?PreDisposition InappropriateToAsk ?Care Advice Given Per Guideline ?GO TO ED NOW: * You need to be seen in the Emergency Department. * Go to the ED at ___________ Hospital. * Another adult ?should drive. NOTE TO TRIAGER - DRIVING: * Leave now. Drive carefully. BRING MEDICINES: * Bring a list of your current ?medicines when you go to the Emergency Department (ER). CARE ADVICE given per Rectal Bleeding (Adult) guideline. ?Referrals ?Med [facility] - E ?

## 2021-05-07 NOTE — Telephone Encounter (Signed)
I spoke with pt; pt having upper abd pain and pain level now is 10; passed a lot of blood since started on 05/07/21 at 5 AM with abd pain, vomiting and 2 hrs later pt was cramping and was no BM just bright red blood.pts friend is there now and pt declines 911. Pt is going now to [place] at [facility]. Pt just went to rest room, no BM but bright red blood in commode water. Advised pt if worsens while enroute to ED pull over and call 911.  Sending note to Dr [name] and Janett Billow CMA and will teams [facility]. ?

## 2021-05-07 NOTE — Discharge Instructions (Addendum)
I have sent you in some medications that should help with your symptoms while your body fights off this infection. You will take the antibiotic once daily for 5 days. I have also sent in Zofran for nausea, and Bentyl for cramping. If pain is really severe, you may take one of the pain tablets, but please attempt the other medications first. Please also drink plenty of fluids to help flush out infection.  ? ?If your symptoms worsen, please do not hesitate to return to the ED. You can also follow up with your PCP ?

## 2021-05-07 NOTE — ED Provider Notes (Signed)
?[facility] EMERGENCY DEPT ?Provider Note ? ? ?CSN: [id number] ?Arrival date & time: 05/07/21  1421 ? ?  ? ?History ? ?Chief Complaint  ?Patient presents with  ? Abdominal Pain  ? ? ?Karen Saunders is a 66 y.o. female for abdominal pain and bloody diarrhea that began this morning.  Patient has a history of IBS and woke up with her typical abdominal cramping with mild nausea and vomiting symptoms.  Abdominal pain is located umbilically.  She went to the bathroom to have a bowel movement which usually improves her symptoms, however shortly after her bowel movement, symptoms returned.  Since then she has been having loose bowel movements with bright red blood and some clots.  Denies rectal pain.  She also endorses some nausea.  Denies chest pain, shortness of breath, fevers, chills, dizziness, fatigue, headache. ? ? ?Abdominal Pain ? ?  ? ?Home Medications ?Prior to Admission medications   ?Medication Sig Start Date End Date Taking? Authorizing Provider  ?azithromycin (ZITHROMAX) 500 MG tablet Take 1 tablet once daily for 5 days 05/07/21  Yes Kathe Becton R, PA-C  ?dicyclomine (BENTYL) 20 MG tablet Take 1 tablet (20 mg total) by mouth every 6 (six) hours as needed for spasms. 05/07/21  Yes Kathe Becton R, PA-C  ?ondansetron (ZOFRAN) 4 MG tablet Take 1 tablet (4 mg total) by mouth every 6 (six) hours for 6 days. 05/07/21 05/13/21 Yes Kathe Becton R, PA-C  ?oxyCODONE-acetaminophen (PERCOCET/ROXICET) 5-325 MG tablet Take 1 tablet by mouth every 6 (six) hours as needed for severe pain. 05/07/21  Yes Kathe Becton R, PA-C  ?amLODipine (NORVASC) 5 MG tablet Take 1 tablet (5 mg total) by mouth daily. 12/07/20   Tonia Ghent, MD  ?aspirin 81 MG tablet Take 81 mg by mouth daily.    [provider]  ?atorvastatin (LIPITOR) 80 MG tablet TAKE 1 TABLET BY MOUTH EVERY DAY 01/26/21   Belva Crome, MD  ?carbamazepine (TEGRETOL) 200 MG tablet carbamazepine 200 mg tablet ? TAKE 1 TABLET IN THE MORNING AND 2  TABLETS AT NIGHT 08/17/19   [provider]  ?dexlansoprazole (DEXILANT) 60 MG capsule Take 60 mg by mouth daily.    [provider]  ?ezetimibe (ZETIA) 10 MG tablet Take 1 tablet (10 mg total) by mouth daily. Pt needs to make appt with provider for further refills 01/12/21   Belva Crome, MD  ?levocetirizine (XYZAL) 5 MG tablet Take 1 tablet (5 mg total) by mouth daily. 12/07/20   Tonia Ghent, MD  ?Melatonin 3 MG TABS Take 1 tablet (3 mg total) by mouth at bedtime as needed. 12/13/18   Tonia Ghent, MD  ?metoprolol tartrate (LOPRESSOR) 50 MG tablet TAKE 1 TABLET BY MOUTH TWICE A DAY 09/25/20   Belva Crome, MD  ?spironolactone (ALDACTONE) 25 MG tablet TAKE 1 TABLET BY MOUTH EVERY DAY 08/17/20   Belva Crome, MD  ?valsartan-hydrochlorothiazide (DIOVAN-HCT) 320-25 MG tablet TAKE 1 TABLET BY MOUTH EVERY DAY 08/17/20   Belva Crome, MD  ?   ? ?Allergies    ?Amoxicillin-pot clavulanate, Azithromycin, Doxycycline, Ibandronate sodium, Iohexol, Raloxifene, Sulfonamide derivatives, and Zoledronic acid   ? ?Review of Systems   ?Review of Systems  ?Gastrointestinal:  Positive for abdominal pain.  ? ?Physical Exam ?Updated Vital Signs ?BP (!) 162/90 (BP Location: Left Arm)   Pulse (!) 59 Comment: Simultaneous filing. User may not have seen previous data.  Temp 97.8 ?F (36.6 ?C) (Oral)   Resp 20  SpO2 96%  ?Physical Exam ?Vitals and nursing note reviewed. Exam conducted with a chaperone present.  ?Constitutional:   ?   General: She is not in acute distress. ?   Appearance: She is not ill-appearing.  ?HENT:  ?   Head: Atraumatic.  ?Eyes:  ?   Conjunctiva/sclera: Conjunctivae normal.  ?Cardiovascular:  ?   Rate and Rhythm: Normal rate and regular rhythm.  ?   Pulses: Normal pulses.  ?   Heart sounds: No murmur heard. ?Pulmonary:  ?   Effort: Pulmonary effort is normal. No respiratory distress.  ?   Breath sounds: Normal breath sounds.  ?Abdominal:  ?   General: Abdomen is flat. There is no  distension.  ?   Palpations: Abdomen is soft.  ?   Tenderness: There is abdominal tenderness in the periumbilical area. There is no right CVA tenderness or left CVA tenderness. Negative signs include Murphy's sign and McBurney's sign.  ?Genitourinary: ?   Rectum: Tenderness present. No external hemorrhoid or internal hemorrhoid. Normal anal tone.  ?Musculoskeletal:     ?   General: Normal range of motion.  ?   Cervical back: Normal range of motion.  ?Skin: ?   General: Skin is warm and dry.  ?   Capillary Refill: Capillary refill takes less than 2 seconds.  ?Neurological:  ?   General: No focal deficit present.  ?   Mental Status: She is alert.  ?Psychiatric:     ?   Mood and Affect: Mood normal.  ? ? ?ED Results / Procedures / Treatments   ?Labs ?(all labs ordered are listed, but only abnormal results are displayed) ?Labs Reviewed  ?COMPREHENSIVE METABOLIC PANEL - Abnormal; Notable for the following components:  ?    Result Value  ? Sodium 130 (*)   ? Chloride 97 (*)   ? Glucose, Bld 106 (*)   ? All other components within normal limits  ?CBC - Abnormal; Notable for the following components:  ? WBC 3.2 (*)   ? RBC 3.51 (*)   ? Hemoglobin 10.8 (*)   ? HCT 31.2 (*)   ? Platelets 142 (*)   ? All other components within normal limits  ?URINALYSIS, ROUTINE W REFLEX MICROSCOPIC - Abnormal; Notable for the following components:  ? Color, Urine STRAW (*)   ? Protein, ur 30 (*)   ? All other components within normal limits  ?LIPASE, BLOOD  ?URINALYSIS, MICROSCOPIC (REFLEX)  ?OCCULT BLOOD X 1 CARD TO LAB, STOOL  ? ? ?EKG ?EKG Interpretation ? ?Date/Time:  Friday May 07 2021 14:36:03 EDT ?Ventricular Rate:  55 ?PR Interval:  144 ?QRS Duration: 96 ?QT Interval:  407 ?QTC Calculation: 390 ?R Axis:   56 ?Text Interpretation: Sinus rhythm Biatrial enlargement Abnormal R-wave progression, early transition Borderline T wave abnormalities Confirmed by Virgina Norfolk [phone]) on 05/07/2021 2:38:53 PM ? ?Radiology ?CT ABDOMEN PELVIS WO  CONTRAST ? ?Result Date: 05/07/2021 ?CLINICAL DATA:  Left lower quadrant abdominal pain. EXAM: CT ABDOMEN AND PELVIS WITHOUT CONTRAST TECHNIQUE: Multidetector CT imaging of the abdomen and pelvis was performed following the standard protocol without IV contrast. RADIATION DOSE REDUCTION: This exam was performed according to the departmental dose-optimization program which includes automated exposure control, adjustment of the mA and/or kV according to patient size and/or use of iterative reconstruction technique. COMPARISON:  February 05, 2015. FINDINGS: Lower chest: No acute abnormality. Hepatobiliary: No focal liver abnormality is seen. No gallstones, gallbladder wall thickening, or biliary dilatation. Pancreas: Unremarkable. No pancreatic ductal  dilatation or surrounding inflammatory changes. Spleen: Normal in size without focal abnormality. Adrenals/Urinary Tract: Adrenal glands appear normal. Vascular calcifications seen involving both kidneys. Possible small nonobstructive right renal calculi are noted. No hydronephrosis or renal obstruction is noted. Urinary bladder is unremarkable. Stomach/Bowel: The stomach is unremarkable. The appendix appears normal. Wall thickening of transverse and descending colon is noted concerning for possible infectious or inflammatory colitis. Mild sigmoid diverticulosis is noted. Vascular/Lymphatic: Aortic atherosclerosis. No enlarged abdominal or pelvic lymph nodes. Reproductive: Status post hysterectomy. No adnexal masses. Other: No abdominal wall hernia or abnormality. No abdominopelvic ascites. Musculoskeletal: No acute or significant osseous findings. IMPRESSION: Wall thickening of transverse and descending colon is noted concerning for possible infectious or inflammatory colitis. Mild sigmoid diverticulosis is noted. Possible nonobstructive right nephrolithiasis. Aortic Atherosclerosis (ICD10-I70.0). Electronically Signed   By: Marijo Conception M.D.   On: 05/07/2021 16:30    ? ?Procedures ?Procedures  ? ? ?Medications Ordered in ED ?Medications  ?ondansetron (ZOFRAN) injection 4 mg (4 mg Intravenous Given 05/07/21 1550)  ?sodium chloride 0.9 % bolus 1,000 mL (0 mLs Intravenous Stopped 4/28

## 2021-05-07 NOTE — ED Notes (Signed)
Patient verbalizes understanding of discharge instructions. Opportunity for questioning and answers were provided. Patient discharged from ED.  °

## 2021-05-07 NOTE — ED Triage Notes (Signed)
Pt h/o IBS and awoke this morning with her typical symptoms of abdominal cramping &  nausea/vomiting - states her symptoms normally resolve after a bowel movement. Today her symptoms have not resolved per her normal pathway and that she also has observed blood in her stools which is not typical for her.   ?

## 2021-05-08 ENCOUNTER — Telehealth (HOSPITAL_BASED_OUTPATIENT_CLINIC_OR_DEPARTMENT_OTHER): Payer: Self-pay | Admitting: Emergency Medicine

## 2021-05-08 MED ORDER — CIPROFLOXACIN HCL 500 MG PO TABS: 500.0000 mg | ORAL_TABLET | Freq: Two times a day (BID) | ORAL | 0 refills | Status: AC

## 2021-05-08 NOTE — Telephone Encounter (Signed)
Patient states that the azithromycin is causing diarrhea requesting different medication.  Provided Cipro for 3 days.  Advised follow-up with her doctor this week. ?

## 2021-05-09 ENCOUNTER — Telehealth: Payer: Self-pay | Admitting: Family Medicine

## 2021-05-09 NOTE — Telephone Encounter (Signed)
Please get update on patient after ER eval, dx'd with colitis.  Thanks.  ?

## 2021-05-10 ENCOUNTER — Ambulatory Visit
Admission: RE | Admit: 2021-05-10 | Discharge: 2021-05-10 | Disposition: A | Discharge status: Home / Self Care | Payer: Medicare Other | Source: Ambulatory Visit | Attending: Family Medicine | Admitting: Family Medicine

## 2021-05-10 DIAGNOSIS — M810 Age-related osteoporosis without current pathological fracture: Secondary | ICD-10-CM

## 2021-05-11 NOTE — Telephone Encounter (Signed)
Spoke with patient and she states she is doing much better. Still has a little stomach pain but most other sx are gone. Has some nausea off and on and takes the medication and that helps that go away. Patient thanks [place] for checking in on her.  ?

## 2021-05-12 NOTE — Telephone Encounter (Signed)
Noted. Thanks.

## 2021-05-19 ENCOUNTER — Encounter: Payer: Self-pay | Admitting: Family Medicine

## 2021-05-19 DIAGNOSIS — M8580 Other specified disorders of bone density and structure, unspecified site: Secondary | ICD-10-CM | POA: Insufficient documentation

## 2021-05-31 DIAGNOSIS — H73892 Other specified disorders of tympanic membrane, left ear: Secondary | ICD-10-CM | POA: Insufficient documentation

## 2021-05-31 DIAGNOSIS — H903 Sensorineural hearing loss, bilateral: Secondary | ICD-10-CM | POA: Insufficient documentation

## 2021-06-22 ENCOUNTER — Ambulatory Visit: Payer: Medicare Other | Admitting: Family Medicine

## 2021-06-25 ENCOUNTER — Ambulatory Visit (INDEPENDENT_AMBULATORY_CARE_PROVIDER_SITE_OTHER): Payer: Medicare Other | Admitting: Family Medicine

## 2021-06-25 ENCOUNTER — Encounter: Payer: Self-pay | Admitting: Family Medicine

## 2021-06-25 ENCOUNTER — Ambulatory Visit (INDEPENDENT_AMBULATORY_CARE_PROVIDER_SITE_OTHER)
Admission: RE | Admit: 2021-06-25 | Discharge: 2021-06-25 | Disposition: A | Discharge status: Home / Self Care | Payer: Medicare Other | Source: Ambulatory Visit | Attending: Family Medicine | Admitting: Family Medicine

## 2021-06-25 VITALS — BP 118/70 | HR 48 | Temp 97.6°F | Ht 63.5 in | Wt 121.0 lb

## 2021-06-25 DIAGNOSIS — R059 Cough, unspecified: Secondary | ICD-10-CM

## 2021-06-25 DIAGNOSIS — R001 Bradycardia, unspecified: Secondary | ICD-10-CM

## 2021-06-25 DIAGNOSIS — R058 Other specified cough: Secondary | ICD-10-CM | POA: Diagnosis not present

## 2021-06-25 MED ORDER — METOPROLOL TARTRATE 50 MG PO TABS: 25.0000 mg | ORAL_TABLET | Freq: Two times a day (BID) | ORAL | 3 refills | Status: DC

## 2021-06-25 MED ORDER — CARBAMAZEPINE 200 MG PO TABS: ORAL_TABLET | ORAL | Status: DC

## 2021-06-25 NOTE — Patient Instructions (Addendum)
Go to the lab on the way out.   If you have mychart we'll likely use that to update you.    Try taking metoprolol 25mg  (1/2 tab) twice a day and let me know if your pulse is still below 60 next week.  Take care.  Glad to see you.  Try dexilant at night and try to keep your head propped up at night.

## 2021-06-25 NOTE — Progress Notes (Unsigned)
Cough worse at night x several weeks. Patient has been taking mucinex; also trying to gargle warm salt water at night. Cough has been dry.  No fevers.  Cough isn't worse but isn't better.  Nights are clearly worse than days.  No wheeze.  Can still take a deep breath.  No much post nasal gtt.  Already taking xyzal.  Tessalon didn't help.  Already taking dexilant.    Bradycardia noted.  On metoprolol 50mg  BID.    She is taking dexilant in the AM and xyzal at night.    She is still caring for her mother.    She has GI f/u pending re: colitis and nausea.  Has colonoscopy pending.

## 2021-06-27 DIAGNOSIS — R001 Bradycardia, unspecified: Secondary | ICD-10-CM | POA: Insufficient documentation

## 2021-06-27 NOTE — Assessment & Plan Note (Signed)
Cough is worse at night after laying down.  Lungs are clear.  Check chest x-ray.  Discussed trying Dexilant at night to see if that would make any difference, discussed elevating the head of the bed.  See after visit summary.

## 2021-06-27 NOTE — Assessment & Plan Note (Signed)
Discussed decreasing metoprolol to 25 mg twice a day, down from 50 mg twice a day.  She can update me if she still has persistent pulse below 60.

## 2021-06-29 ENCOUNTER — Encounter: Payer: Self-pay | Admitting: Family Medicine

## 2021-07-01 ENCOUNTER — Other Ambulatory Visit: Payer: Self-pay | Admitting: Family Medicine

## 2021-07-01 MED ORDER — HYDROCODONE BIT-HOMATROP MBR 5-1.5 MG/5ML PO SOLN: 5.0000 mL | Freq: Four times a day (QID) | ORAL | 0 refills | Status: DC | PRN

## 2021-07-06 ENCOUNTER — Other Ambulatory Visit: Payer: Self-pay | Admitting: Family Medicine

## 2021-07-06 DIAGNOSIS — R059 Cough, unspecified: Secondary | ICD-10-CM

## 2021-07-08 ENCOUNTER — Other Ambulatory Visit: Payer: Self-pay | Admitting: Family Medicine

## 2021-07-08 ENCOUNTER — Other Ambulatory Visit: Payer: Self-pay | Admitting: Interventional Cardiology

## 2021-07-08 MED ORDER — PREDNISONE 10 MG PO TABS: ORAL_TABLET | ORAL | 0 refills | Status: DC

## 2021-07-09 ENCOUNTER — Other Ambulatory Visit: Payer: Self-pay | Admitting: Family Medicine

## 2021-07-09 MED ORDER — FAMOTIDINE 20 MG PO TABS: 20.0000 mg | ORAL_TABLET | Freq: Two times a day (BID) | ORAL | Status: DC

## 2021-07-09 MED ORDER — ALBUTEROL SULFATE HFA 108 (90 BASE) MCG/ACT IN AERS
1.0000 | INHALATION_SPRAY | Freq: Four times a day (QID) | RESPIRATORY_TRACT | 1 refills | Status: DC | PRN
Start: 2021-07-09 — End: 2021-07-22

## 2021-07-14 NOTE — Telephone Encounter (Signed)
Noted. Thanks.

## 2021-07-14 NOTE — Telephone Encounter (Signed)
Called and spoke with patient and scheduled her an appt to come in tomorrow at 2:30 pm. FYI for her appt tomorrow

## 2021-07-15 ENCOUNTER — Ambulatory Visit (INDEPENDENT_AMBULATORY_CARE_PROVIDER_SITE_OTHER): Payer: Medicare Other | Admitting: Family Medicine

## 2021-07-15 ENCOUNTER — Encounter: Payer: Self-pay | Admitting: Family Medicine

## 2021-07-15 VITALS — BP 116/72 | HR 63 | Temp 97.8°F | Ht 63.5 in | Wt 116.0 lb

## 2021-07-15 DIAGNOSIS — R059 Cough, unspecified: Secondary | ICD-10-CM | POA: Diagnosis not present

## 2021-07-15 MED ORDER — AMLODIPINE BESYLATE 5 MG PO TABS: 5.0000 mg | ORAL_TABLET | Freq: Every day | ORAL | 12 refills | Status: DC

## 2021-07-15 MED ORDER — VALSARTAN-HYDROCHLOROTHIAZIDE 320-25 MG PO TABS: ORAL_TABLET | ORAL | 3 refills | Status: DC

## 2021-07-15 MED ORDER — HYDROCHLOROTHIAZIDE 25 MG PO TABS: 25.0000 mg | ORAL_TABLET | Freq: Every day | ORAL | 3 refills | Status: DC

## 2021-07-15 NOTE — Patient Instructions (Addendum)
Let me see about getting you in with ENT soon.   Keep the pulmonary appointment.   Stop valsartan HCTZ.   Take plain HCTZ tomorrow.  If BP is elevated above 140/90, then okay to take an extra dose of amlodipine if needed.   Take care.  Glad to see you.

## 2021-07-15 NOTE — Progress Notes (Signed)
BP improved today, d/w pt.    Cough.  Waking up with cough at night.  She feels something in the upper airway at the time.  No improvement with gargling with salt water.  Cough is worse at night but still going on the day.  Cough is worse when she lays down, it occ happens sitting up but is worse supine.  She tried elevated the head of her bed, dexilant in the PM, tessalon, pepcid, prednisone, hycodan, delsym, albuterol.    Meds, vitals, and allergies reviewed.   ROS: Per HPI unless specifically indicated in ROS section   GEN: nad, alert and oriented HEENT: mucous membranes moist NECK: supple w/o LA CV: rrr.  PULM: ctab, no inc wob ABD: soft, +bs EXT: no edema SKIN: Well-perfused.

## 2021-07-16 ENCOUNTER — Other Ambulatory Visit: Payer: Self-pay | Admitting: Family Medicine

## 2021-07-16 MED ORDER — PROMETHAZINE HCL 12.5 MG PO TABS: 12.5000 mg | ORAL_TABLET | Freq: Every evening | ORAL | 0 refills | Status: DC | PRN

## 2021-07-16 NOTE — Telephone Encounter (Signed)
Patient states that she has had increased cough. It is still dry not productive at all. Denies any other or new symptoms.

## 2021-07-16 NOTE — Telephone Encounter (Signed)
Pt called in about her cough, stated she's had it for about a month now and needs a medication for it to help. She did see Para March yesterday (7.6.2023) for it, but would like to speak with the nurse for options. Please advise when possible, thank you.  Callback #: [id number]

## 2021-07-16 NOTE — Telephone Encounter (Signed)
I don't know about options at this point other than than seeing ENT/pulmonary and changing the BP medicine.    Did she have new sx today?  Please let me know.  Thanks.

## 2021-07-16 NOTE — Telephone Encounter (Signed)
Not sure what all you talked to her about. Any options?

## 2021-07-18 NOTE — Assessment & Plan Note (Signed)
Discussed options.  Refer to ENT Keep the pulmonary appointment.  Stop valsartan HCTZ.   Take plain HCTZ tomorrow.  If BP is elevated above 140/90, then okay to take an extra dose of amlodipine if needed.

## 2021-07-22 ENCOUNTER — Ambulatory Visit (INDEPENDENT_AMBULATORY_CARE_PROVIDER_SITE_OTHER): Payer: Medicare Other | Admitting: Internal Medicine

## 2021-07-22 ENCOUNTER — Ambulatory Visit: Payer: Medicare Other

## 2021-07-22 ENCOUNTER — Encounter: Payer: Self-pay | Admitting: Internal Medicine

## 2021-07-22 DIAGNOSIS — R058 Other specified cough: Secondary | ICD-10-CM | POA: Insufficient documentation

## 2021-07-22 MED ORDER — BENZONATATE 200 MG PO CAPS: 200.0000 mg | ORAL_CAPSULE | Freq: Three times a day (TID) | ORAL | 1 refills | Status: DC | PRN

## 2021-07-22 MED ORDER — PREDNISONE 10 MG PO TABS: ORAL_TABLET | ORAL | 0 refills | Status: DC

## 2021-07-22 NOTE — Progress Notes (Signed)
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  Pt c/o dry cough for the past month, worse at night when lies down and wakes up coughing.   Dyspnea:  Not limited by breathing from desired activities   Cough: much worse immediately when supine now @  45 degrees / no production Sleep: at least once around 2 am every night s fail  SABA use: none  Abx/pred/gerd rx no better  Freq throat clearing / globus sensation but no overt sinus symptoms  No obvious other patterns in day to day or daytime  variability or assoc excess/ purulent sputum or mucus plugs or hemoptysis or cp or chest tightness, subjective wheeze or overt   hb symptoms on just dexilant q day.   Also denies any obvious fluctuation of symptoms with weather or environmental changes or other aggravating or alleviating factors except as outlined above   No unusual exposure hx or h/o childhood pna/ asthma or knowledge of premature birth.  Current Allergies, Complete Past Medical History, Past Surgical History, Family History, and Social History were reviewed in Karen Saunders record.  ROS  The following are not active complaints unless bolded Hoarseness, sore throat, dysphagia, dental problems, itching, sneezing,  nasal congestion or discharge of excess  mucus or purulent secretions, ear ache,   fever, chills, sweats, unintended wt loss or wt gain, classically pleuritic or exertional cp,  orthopnea pnd or arm/hand swelling  or leg swelling, presyncope, palpitations, abdominal pain, anorexia, nausea, vomiting, diarrhea  or change in bowel habits or change in bladder habits, change in stools or change in urine, dysuria, hematuria,  rash, arthralgias, visual complaints, headache, numbness, weakness or ataxia or problems with walking or coordination,  change in mood or  memory.           Past Medical History:  Diagnosis Date   Anemia    with prev unremarkable heme eval ~2011   CAD (coronary artery disease)    Karen. Donnie Saunders with Cards   History of gastric ulcer    2005    Hyperlipidemia    Hypertension    IBS (irritable bowel syndrome)    Leukopenia    with prev unremarkable heme eval ~2011   Migraine    Unsp w/o intract w/o status migrainosus   Osteoporosis 11/18/2004   Shingles    Trigeminal neuralgia 03/05/2012    Outpatient Medications Prior to Visit  Medication Sig Dispense Refill   albuterol (VENTOLIN HFA) 108 (90 Base) MCG/ACT inhaler Inhale 1-2 puffs into the lungs every 6 (six) hours as needed (for cough). 8 g 1   amLODipine (NORVASC) 5 MG tablet Take 1-2 tablets (5-10 mg total) by mouth daily. 30 tablet 12  aspirin 81 MG tablet Take 81 mg by mouth daily.     atorvastatin (LIPITOR) 80 MG tablet TAKE 1 TABLET BY MOUTH EVERY DAY 90 tablet 1   carbamazepine (TEGRETOL) 200 MG tablet TAKE 2 TABLETS IN THE MORNING AND 2 TABLETS AT NIGHT     dexlansoprazole (DEXILANT) 60 MG capsule Take 60 mg by mouth daily.     ezetimibe (ZETIA) 10 MG tablet Take 1 tablet (10 mg total) by mouth daily. Pt needs to make appt with provider for further refills Thanks 1st attempt 30 tablet 0   famotidine (PEPCID) 20 MG tablet Take 1 tablet (20 mg total) by mouth 2 (two) times daily.     hydrochlorothiazide (HYDRODIURIL) 25 MG tablet Take 1 tablet (25 mg total)  by mouth daily. 90 tablet 3   levocetirizine (XYZAL) 5 MG tablet Take 1 tablet (5 mg total) by mouth daily. 30 tablet 12   Melatonin 3 MG TABS Take 1 tablet (3 mg total) by mouth at bedtime as needed.     metoprolol tartrate (LOPRESSOR) 50 MG tablet Take 0.5 tablets (25 mg total) by mouth 2 (two) times daily. 180 tablet 3   ondansetron (ZOFRAN) 4 MG tablet Take 4 mg by mouth every 8 (eight) hours as needed for nausea or vomiting.     promethazine (PHENERGAN) 12.5 MG tablet Take 1 tablet (12.5 mg total) by mouth at bedtime as needed (for cough). 30 tablet 0   spironolactone (ALDACTONE) 25 MG tablet TAKE 1 TABLET BY MOUTH EVERY DAY 90 tablet 3   dicyclomine (BENTYL) 20 MG tablet Take 1 tablet (20 mg total) by mouth every 6 (six) hours as needed for spasms. 20 tablet 0       0   valsartan-hydrochlorothiazide (DIOVAN-HCT) 320-25 MG tablet Held as of 07/15/21 90 tablet 3   No facility-administered medications prior to visit.     Objective:     BP 124/68 (BP Location: Left Arm, Cuff Size: Normal)   Pulse (!) 59   Temp 98.2 F (36.8 C) (Oral)   Ht 5\' 2"  (1.575 m)   Wt 117 lb (53.1 kg)   SpO2 99% Comment: on RA  BMI 21.40 kg/m   SpO2: 99 % (on RA)  Somber bf with incessant thorat clearing      HEENT : Oropharynx  clear/  full dentures     Nasal turbinates nl    NECK :  without  apparent JVD/ palpable Nodes/TM    LUNGS: no acc muscle use,  Nl contour chest which is clear to A and P bilaterally without cough on insp or exp maneuvers   CV:  RRR  no s3 or murmur or increase in P2, and no edema   ABD:  soft and nontender with nl inspiratory excursion in the supine position. No bruits or organomegaly appreciated   MS:  Nl gait/ ext warm without deformities Or obvious joint restrictions  calf tenderness, cyanosis or clubbing    SKIN: warm and dry without lesions    NEURO:  alert, approp, nl sensorium with  no motor or cerebellar deficits apparent.    I personally reviewed images  and agree with radiology impression as follows:  CXR:   pa and lateral  06/25/21  No active cardiopulmonary disease.    Assessment   Upper airway cough syndrome Onset ?  Decades with prior ENT / Allergy evaluation and maint on xyzal and dexilant at 1st pulmonary eval  -  Sinus ct 07/22/2021  -  cylcical  cough with tessalon 200 mg  -  Allergy screen 07/22/2021 >  Eos 0. /  IgE   - 07/22/2021 cyclical cough rx with tessalon to start >>>  The most common causes of chronic cough in immunocompetent adults include the following: upper airway cough syndrome (UACS), previously referred to as postnasal drip syndrome (PNDS), which is caused by variety of rhinosinus conditions; (2) asthma; (3) GERD; (4) chronic bronchitis from cigarette smoking or other inhaled environmental irritants; (5) nonasthmatic eosinophilic bronchitis; and (6) bronchiectasis.   These conditions, singly or in combination, have accounted for up to 94% of the causes of chronic cough in prospective studies.   Other conditions have constituted no >6% of the causes in prospective studies These have included bronchogenic carcinoma, chronic interstitial pneumonia, sarcoidosis, left ventricular failure, ACEI-induced cough, and aspiration from a condition associated with pharyngeal dysfunction.    Chronic cough is often simultaneously caused by more than one condition. A single cause has been found from 38 to 82% of the time, multiple causes from 18 to 62%. Multiply caused cough has been the result of three diseases up to 42% of the time.      Of the three most common causes of  Sub-acute / recurrent or chronic cough, only one (GERD, which she clearly has)  can actually contribute to/ trigger  the other two (asthma and post nasal drip syndrome)  and perpetuate the cylce of cough.  While not intuitively obvious, many patients with chronic low grade reflux do not cough until there is a primary insult that disturbs the protective epithelial  barrier and exposes sensitive nerve endings.   This is typically viral but can due to PNDS and  either may apply here.     >>>>   The point is that once this occurs, it is difficult to eliminate the cycle  using anything but a maximally effective acid suppression regimen at least in the short run, accompanied by an appropriate diet to address non acid GERD and control / eliminate the cough itself for to the extent possible with tessalon/ hard rock candy and eliminate pnds, esp hs, with  H2 at hs plus 1st gen H1 blockers per guidelines >>> also so added 6 day taper off  Prednisone starting at 40 mg per day in case of component of Th-2 driven upper or lower airways inflammation (if cough responds short term only to relapse before return while will on full rx for uacs (as above), then  that would point to allergic rhinitis/ asthma or eos bronchitis as alternative dx)   The standardized cough guidelines published in Chest by [place] in 2006 are still the best available and consist of a multiple step process (up to 12!) , not a single office visit,  and are intended  to address this problem logically,  with an alogrithm dependent on response to empiric treatment at  each progressive step  to determine a specific diagnosis with  minimal addtional testing needed. Therefore if adherence is an issue or can't be accurately verified,  it's very unlikely the standard evaluation and treatment will be successful here.    Furthermore, response to therapy (other than acute cough suppression, which should only be used short term with avoidance of narcotic containing cough syrups if possible), can be a gradual process for which the patient is not likely to  perceive immediate benefit.  Unlike going to an eye doctor where the best perscription is almost always the first one and is immediately effective,  this is almost never the case in the management of chronic cough syndromes. Therefore the patient needs to commit up  front to consistently adhere to recommendations  for up to 4- 6 weeks of therapy directed at the likely underlying problem(s) before the response can be reasonably evaluated.    rec f/u in 4 weeks with all meds in hand using a trust but verify approach to confirm accurate Medication  Reconciliation The principal here is that until we are certain that the  patients are doing what we've asked, it makes no sense to ask them to do more.           Each maintenance medication was reviewed in detail including emphasizing most importantly the difference between maintenance and prns and under what circumstances the prns are to be triggered using an action plan format where appropriate.  Total time for H and P, chart review, counseling, and generating customized AVS unique to this new pt office visit / same day charting  > 60 min for chronic refractory respiratory  symptoms of uncertain etiology            Christinia Gully, MD 07/22/2021

## 2021-07-22 NOTE — Patient Instructions (Addendum)
For cough cough take tessalon 200 mg up to 3 x daily   Prednisone 10 mg take  4 each am x 2 days,   2 each am x 2 days,  1 each am x 2 days and stop   Stop the xyzal and For drainage / throat tickle try take CHLORPHENIRAMINE  4 mg  ("Allergy Relief" 4mg   at [facility] should be easiest to find in the blue box usually on bottom shelf)  take one every 4 hours as needed - extremely effective and inexpensive over the counter- may cause drowsiness so start with just a dose or two an hour before bedtime and see how you tolerate it before trying in daytime.   Add pepcid 20 mg at hour before  bed when you take your chlorpheniramine  Continue dexilant 60 mg Take 30-60 min before first meal of the day   GERD (REFLUX)  is an extremely common cause of respiratory symptoms just like yours , many times with no obvious heartburn at all.    It can be treated with medication, but also with lifestyle changes including elevation of the head of your bed (ideally with 6 -8inch blocks under the headboard of your bed),  Smoking cessation, avoidance of late meals, excessive alcohol, and avoid fatty foods, chocolate, peppermint, colas, red wine, and acidic juices such as orange juice.  NO MINT OR MENTHOL PRODUCTS SO NO COUGH DROPS  USE SUGARLESS CANDY INSTEAD (Jolley ranchers or Stover's or Life Savers or Ludens) or even ice chips will also do - the key is to swallow to prevent all throat clearing. NO OIL BASED VITAMINS - use powdered substitutes.  Avoid fish oil when coughing.   Please remember to go to the lab department   for your tests - we will call you with the results when they are available.      Please schedule a follow up office visit in 4 weeks, sooner if needed  with all medications /inhalers/ solutions in hand so we can verify exactly what you are taking. This includes all medications from all doctors and over the counters

## 2021-07-23 ENCOUNTER — Encounter: Payer: Self-pay | Admitting: Internal Medicine

## 2021-07-23 LAB — CBC WITH DIFFERENTIAL/PLATELET
Basophils Absolute: 0 10*3/uL (ref 0.0–0.1)
Basophils Relative: 1 % (ref 0.0–3.0)
Eosinophils Absolute: 0 10*3/uL (ref 0.0–0.7)
Eosinophils Relative: 0.5 % (ref 0.0–5.0)
HCT: 29.9 % — ABNORMAL LOW (ref 36.0–46.0)
Hemoglobin: 10.4 g/dL — ABNORMAL LOW (ref 12.0–15.0)
Lymphocytes Relative: 49.7 % — ABNORMAL HIGH (ref 12.0–46.0)
Lymphs Abs: 1.1 10*3/uL (ref 0.7–4.0)
MCHC: 34.6 g/dL (ref 30.0–36.0)
MCV: 92.4 fl (ref 78.0–100.0)
Monocytes Absolute: 0.2 10*3/uL (ref 0.1–1.0)
Monocytes Relative: 10.8 % (ref 3.0–12.0)
Neutro Abs: 0.8 10*3/uL — ABNORMAL LOW (ref 1.4–7.7)
Neutrophils Relative %: 38 % — ABNORMAL LOW (ref 43.0–77.0)
Platelets: 140 10*3/uL — ABNORMAL LOW (ref 150.0–400.0)
RBC: 3.24 Mil/uL — ABNORMAL LOW (ref 3.87–5.11)
RDW: 13.8 % (ref 11.5–15.5)
WBC: 2.1 10*3/uL — ABNORMAL LOW (ref 4.0–10.5)

## 2021-07-23 NOTE — Assessment & Plan Note (Addendum)
Onset ?  Decades with prior ENT / Allergy evaluation and maint on xyzal and dexilant at 1st pulmonary eval  -  Sinus ct 07/22/2021  -  cylcical cough with tessalon 200 mg  -  Allergy screen 07/22/2021 >  Eos 0. /  IgE   - 07/22/2021 cyclical cough rx with tessalon to start >>>  The most common causes of chronic cough in immunocompetent adults include the following: upper airway cough syndrome (UACS), previously referred to as postnasal drip syndrome (PNDS), which is caused by variety of rhinosinus conditions; (2) asthma; (3) GERD; (4) chronic bronchitis from cigarette smoking or other inhaled environmental irritants; (5) nonasthmatic eosinophilic bronchitis; and (6) bronchiectasis.   These conditions, singly or in combination, have accounted for up to 94% of the causes of chronic cough in prospective studies.   Other conditions have constituted no >6% of the causes in prospective studies These have included bronchogenic carcinoma, chronic interstitial pneumonia, sarcoidosis, left ventricular failure, ACEI-induced cough, and aspiration from a condition associated with pharyngeal dysfunction.    Chronic cough is often simultaneously caused by more than one condition. A single cause has been found from 38 to 82% of the time, multiple causes from 18 to 62%. Multiply caused cough has been the result of three diseases up to 42% of the time.      Of the three most common causes of  Sub-acute / recurrent or chronic cough, only one (GERD, which she clearly has)  can actually contribute to/ trigger  the other two (asthma and post nasal drip syndrome)  and perpetuate the cylce of cough.  While not intuitively obvious, many patients with chronic low grade reflux do not cough until there is a primary insult that disturbs the protective epithelial barrier and exposes sensitive nerve endings.   This is typically viral but can due to PNDS and  either may apply here.     >>>>   The point is that once this occurs, it  is difficult to eliminate the cycle  using anything but a maximally effective acid suppression regimen at least in the short run, accompanied by an appropriate diet to address non acid GERD and control / eliminate the cough itself for to the extent possible with tessalon/ hard rock candy and eliminate pnds, esp hs, with  H2 at hs plus 1st gen H1 blockers per guidelines >>> also so added 6 day taper off  Prednisone starting at 40 mg per day in case of component of Th-2 driven upper or lower airways inflammation (if cough responds short term only to relapse before return while will on full rx for uacs (as above), then  that would point to allergic rhinitis/ asthma or eos bronchitis as alternative dx)   The standardized cough guidelines published in Chest by [place] in 2006 are still the best available and consist of a multiple step process (up to 12!) , not a single office visit,  and are intended  to address this problem logically,  with an alogrithm dependent on response to empiric treatment at  each progressive step  to determine a specific diagnosis with  minimal addtional testing needed. Therefore if adherence is an issue or can't be accurately verified,  it's very unlikely the standard evaluation and treatment will be successful here.    Furthermore, response to therapy (other than acute cough suppression, which should only be used short term with avoidance of narcotic containing cough syrups if possible), can be a gradual process for which the  patient is not likely to  perceive immediate benefit.  Unlike going to an eye doctor where the best perscription is almost always the first one and is immediately effective, this is almost never the case in the management of chronic cough syndromes. Therefore the patient needs to commit up front to consistently adhere to recommendations  for up to 4- 6 weeks of therapy directed at the likely underlying problem(s) before the response can be reasonably  evaluated.    rec f/u in 4 weeks with all meds in hand using a trust but verify approach to confirm accurate Medication  Reconciliation The principal here is that until we are certain that the  patients are doing what we've asked, it makes no sense to ask them to do more.           Each maintenance medication was reviewed in detail including emphasizing most importantly the difference between maintenance and prns and under what circumstances the prns are to be triggered using an action plan format where appropriate.  Total time for H and P, chart review, counseling, and generating customized AVS unique to this new pt office visit / same day charting  > 60 min for chronic refractory respiratory  symptoms of uncertain etiology

## 2021-07-24 LAB — IGE: IgE (Immunoglobulin E), Serum: <2 kU/L (ref <=114)

## 2021-07-28 ENCOUNTER — Ambulatory Visit (HOSPITAL_BASED_OUTPATIENT_CLINIC_OR_DEPARTMENT_OTHER)
Admission: RE | Admit: 2021-07-28 | Discharge: 2021-07-28 | Disposition: A | Discharge status: Home / Self Care | Payer: Medicare Other | Source: Ambulatory Visit | Attending: Internal Medicine | Admitting: Internal Medicine

## 2021-07-28 DIAGNOSIS — R058 Other specified cough: Secondary | ICD-10-CM | POA: Diagnosis present

## 2021-07-30 ENCOUNTER — Telehealth: Payer: Self-pay | Admitting: *Deleted

## 2021-07-30 NOTE — Telephone Encounter (Signed)
Primary Cardiologist:Henry Malissa Hippo III, MD  Chart reviewed as part of pre-operative protocol coverage. Because of Tylyn L Ofallon's past medical history and time since last visit, he/she will require a follow-up visit in order to better assess preoperative cardiovascular risk.  Pre-op covering staff: - Please schedule appointment and call patient to inform them. - Please contact requesting surgeon's office via preferred method (i.e, phone, fax) to inform them of need for appointment prior to surgery.  If applicable, this message will also be routed to pharmacy pool and/or primary cardiologist for input on holding anticoagulant/antiplatelet agent as requested below so that this information is available at time of patient's appointment.   Levi Aland, NP-C    07/30/2021, 4:35 PM  [facility] [address], [address] Office [phone] Fax [phone]

## 2021-07-30 NOTE — Telephone Encounter (Signed)
   Pre-operative Risk Assessment    Patient Name: Karen Saunders  DOB: Mar 09, 1955 MRN: [id number]      Request for Surgical Clearance    Procedure:   COLONOSCOPY  Date of Surgery:  Clearance 08/23/21                                 Surgeon:  DR. MANN Surgeon's Group or Practice Name:  [facility] Phone number:  [phone] Fax number:  [phone]   Type of Clearance Requested:   - Medical  - Pharmacy:  Hold Aspirin NOT INDICATED   Type of Anesthesia:   PROPOFOL   Additional requests/questions:    Wilhemina Cash   07/30/2021, 9:39 AM

## 2021-08-02 NOTE — Telephone Encounter (Signed)
Pt agreeable to plan of care for in office appt. Pt has been scheduled to see Eligha Bridegroom, NP 08/09/21 @ 1:55. Notes have been forwarded to NP for upcoming appt. Will send FYI to requesting office the pt has appt 08/09/21. Once cleared our office will be sure to fax notes.

## 2021-08-03 ENCOUNTER — Telehealth: Payer: Self-pay | Admitting: Internal Medicine

## 2021-08-03 ENCOUNTER — Other Ambulatory Visit: Payer: Self-pay | Admitting: Interventional Cardiology

## 2021-08-05 NOTE — Telephone Encounter (Signed)
Called and spoke with patient. She confirmed that she is taking the chlorpheniramine and famotidine at night. She is even elevating the head of her bed at night and she has not noticed any relief. She also confirmed that that cough is non-productive.   I offered to move her appointment but she stated that she was just here on the 07/23/21.

## 2021-08-05 NOTE — Telephone Encounter (Signed)
Be sure taking chlorpheniramine 4 mg one hour before bed and if not better then take 2   Keep f/u appt with all meds in hand, ok to move up if not responding to chlorpheniramine

## 2021-08-05 NOTE — Telephone Encounter (Signed)
Called and left voicemail for patient to call office back in regards to her on going cough

## 2021-08-05 NOTE — Telephone Encounter (Signed)
Sorry to hear the meds aren't working and all I can do is offer to see her next available or see if we can expedite referral to ent which was the plan

## 2021-08-06 NOTE — Progress Notes (Unsigned)
Cardiology Office Note:    Date:  08/09/2021   ID:  Karen Saunders, DOB Mar 08, 1955, MRN [id number]  PCP:  Joaquim Nam, MD   [facility] Providers Cardiologist:  Lesleigh Noe, MD     Referring MD: Joaquim Nam, MD   Chief Complaint: preoperative cardiac clearance  History of Present Illness:    Karen Saunders is a pleasant 66 y.o. female with a hx of CAD s/p CABG 2002, hypertension, chronic anemia, hyperlipidemia, left bundle branch block, and systolic murmur.   Previous patient of Dr. Donnie Aho and Dr. Tomie China, she established care with Dr. Katrinka Blazing in 2020. Low risk nuclear stress test 2016. She had an echo with LVEF 55-60%, normal wall motion, mildly increased LV hypertrophy, impaired relaxation pattern of LV diastolic filling, normal RV function, no significant valve disease.   She was last seen in our office on 07/31/20 by Dr. Katrinka Blazing. She is now pending colonoscopy.  Today, she is here for preoperative cardiac evaluation for upcoming colonoscopy. Currently bother by nonproductive cough x 1 month, frustrated that it has not improved with treatment options provided by pulmonology. Prior to cough, no concerns for dyspnea.  She is going back for another appointment in 3 days. Walks for exercise and works part-time at [employer]. She denies chest pain, shortness of breath, lower extremity edema, fatigue, palpitations, melena, hematuria, hemoptysis, diaphoresis, weakness, presyncope, syncope, orthopnea, and PND.   Past Medical History:  Diagnosis Date   Anemia    with prev unremarkable heme eval ~2011   CAD (coronary artery disease)    Dr. Donnie Aho with Cards   History of gastric ulcer    2005    Hyperlipidemia    Hypertension    IBS (irritable bowel syndrome)    Leukopenia    with prev unremarkable heme eval ~2011   Migraine    Unsp w/o intract w/o status migrainosus   Osteoporosis 11/18/2004   Shingles    Trigeminal neuralgia 03/05/2012    Past Surgical  History:  Procedure Laterality Date   ABDOMINAL HYSTERECTOMY  2002   CORONARY ARTERY BYPASS GRAFT  2002   LUMBAR SPINE SURGERY     Stress cardiolite  09/11/2008   Probably normal with breast attenuation noted but no evidence of ischemia   TONSILLECTOMY  ~ 1965    Current Medications: Current Meds  Medication Sig   amLODipine (NORVASC) 5 MG tablet Take 1-2 tablets (5-10 mg total) by mouth daily.   aspirin 81 MG tablet Take 81 mg by mouth daily.   atorvastatin (LIPITOR) 80 MG tablet TAKE 1 TABLET BY MOUTH EVERY DAY   benzonatate (TESSALON) 200 MG capsule Take 1 capsule (200 mg total) by mouth 3 (three) times daily as needed for cough.   carbamazepine (TEGRETOL) 200 MG tablet TAKE 2 TABLETS IN THE MORNING AND 2 TABLETS AT NIGHT   dexlansoprazole (DEXILANT) 60 MG capsule Take 60 mg by mouth daily.   ezetimibe (ZETIA) 10 MG tablet Take 1 tablet (10 mg total) by mouth daily. Pt needs to make appt with provider for further refills Thanks 1st attempt   famotidine (PEPCID) 20 MG tablet Take 1 tablet (20 mg total) by mouth 2 (two) times daily.   hydrochlorothiazide (HYDRODIURIL) 25 MG tablet Take 1 tablet (25 mg total) by mouth daily.   Melatonin 3 MG TABS Take 1 tablet (3 mg total) by mouth at bedtime as needed.   metoprolol tartrate (LOPRESSOR) 50 MG tablet Take 0.5 tablets (25 mg total)  by mouth 2 (two) times daily.   ondansetron (ZOFRAN) 4 MG tablet Take 4 mg by mouth every 8 (eight) hours as needed for nausea or vomiting.   promethazine (PHENERGAN) 12.5 MG tablet Take 1 tablet (12.5 mg total) by mouth at bedtime as needed (for cough).   spironolactone (ALDACTONE) 25 MG tablet TAKE 1 TABLET BY MOUTH EVERY DAY   [DISCONTINUED] predniSONE (DELTASONE) 10 MG tablet Take  4 each am x 2 days,   2 each am x 2 days,  1 each am x 2 days and stop     Allergies:   Amoxicillin-pot clavulanate, Azithromycin, Delsym [dextromethorphan], Doxycycline, Ibandronate sodium, Iohexol, Raloxifene, Sulfonamide  derivatives, Zoledronic acid, and Iodinated contrast media   Social History   Socioeconomic History   Marital status: Divorced    Spouse name: Not on file   Number of children: 1   Years of education: Not on file   Highest education level: Not on file  Occupational History   Occupation: MEDICAID SPEC    Employer: [employer]   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     Works at [employer]   Social Determinants of Corporate investment banker Strain: Not on [employer] Insecurity: Not on file  Transportation Needs: Not on file  Physical Activity: Not on file  Stress: Not on file  Social Connections: Not on file     Family History: The patient's family history includes Arthritis in her mother; Dementia in her mother; Diabetes in her father and mother; Heart disease in her father; Hypertension in her father and mother; Stroke in her father. There is no history of Colon cancer or Breast cancer.  ROS:   Please see the history of present illness.    + cough All other systems reviewed and are negative.  Labs/Other Studies Reviewed:    The following studies were reviewed today:  Echo 11/02/2018   1. Left ventricular ejection fraction, by visual estimation, is 55 to  60%. The left ventricle has normal function. Normal left ventricular size.  Mildly increased left ventricular posterior wall thickness. There is  mildly increased left ventricular  hypertrophy. Normal wall  motion.   2. Left ventricular diastolic Doppler parameters are consistent with  impaired relaxation pattern of LV diastolic filling.   3. Global right ventricle has normal systolic function.The right  ventricular size is normal. No increase in right ventricular wall  thickness.   4. Left atrial size was normal.   5. Right atrial size was normal.   6. The mitral valve is normal in structure. No evidence of mitral valve  regurgitation. No evidence of mitral stenosis.   7. The tricuspid valve is normal in structure. Tricuspid valve  regurgitation is trivial.   8. The aortic valve is tricuspid Aortic valve regurgitation was not  visualized by color flow Doppler. Structurally normal aortic valve,  with  no evidence of sclerosis or stenosis.   9. The inferior vena cava is normal in size with greater than 50%  respiratory variability, suggesting right atrial pressure of 3 mmHg.  10. The tricuspid regurgitant velocity is 2.36 m/s, and with an assumed  right atrial pressure of 3 mmHg, the estimated right ventricular systolic  pressure is normal at 25.3 mmHg.   Nuclear Stress Test 01/2014  1. No reversible ischemia or infarction.   2. Normal left ventricular wall motion.   3. Left ventricular ejection fraction 78%   4. Low-risk stress test findings*.   Recent Labs: 10/27/2020: TSH 1.80 05/07/2021: ALT 21; BUN 19; Creatinine, Ser 0.97; Potassium 4.5; Sodium 130 07/22/2021: Hemoglobin 10.4; Platelets 140.0  Recent Lipid Panel    Component Value Date/Time   CHOL 160 08/18/2020 0933   TRIG 66 08/18/2020 0933   HDL 67 08/18/2020 0933   CHOLHDL 2.4 08/18/2020 0933   CHOLHDL 2 12/14/2017 1326   VLDL 17.2 12/14/2017 1326   LDLCALC 80 08/18/2020 0933   LDLDIRECT 83.2 11/09/2010 1244     Risk Assessment/Calculations:         Physical Exam:    VS:  BP 140/90   Pulse (!) 57   Ht 5\' 5"  (1.651 m)   Wt 122 lb 3.2 oz (55.4 kg)   BMI 20.34 kg/m     Wt Readings from Last 3 Encounters:   08/09/21 122 lb 3.2 oz (55.4 kg)  07/22/21 117 lb (53.1 kg)  07/15/21 116 lb (52.6 kg)     GEN:  Well nourished, well developed in no acute distress HEENT: Normal NECK: No JVD; No carotid bruits CARDIAC: RRR, 2/6 systolic murmur. No rubs, gallops RESPIRATORY:  Clear to auscultation without rales, wheezing or rhonchi  ABDOMEN: Soft, non-tender, non-distended MUSCULOSKELETAL:  No edema; No deformity. 2+ pedal pulses, equal bilaterally SKIN: Warm and dry NEUROLOGIC:  Alert and oriented x 3 PSYCHIATRIC:  Normal affect   EKG:  EKG is ordered today.  The ekg ordered today demonstrates sinus bradycardia at 57 bpm, biatrial enlargement, TWI anterolateral leads, no acute change from previous  Diagnoses:    1. Preoperative cardiovascular examination   2. Systolic murmur   3. Left bundle branch block   4. Hx of CABG   5. Coronary artery disease involving native coronary artery of native heart without angina pectoris   6. Subacute cough   7. Essential hypertension   8. Hyperlipidemia LDL goal <70    Assessment and Plan:     Preoperative cardiac clearance: The patient is doing well from a cardiac perspective. Therefore, based on ACC/AHA guidelines, the patient would be at acceptable risk for the planned procedure without further cardiovascular testing. The patient was advised that if he develops new symptoms prior to surgery to contact our office to arrange for a follow-up visit, and he verbalized understanding. According to the Revised Cardiac Risk Index (RCRI), her Perioperative Risk of Major Cardiac Event is (%): 0.4. Her Functional Capacity in METs is: 6.82 according to the Duke Activity Status Index (DASI).  We will get echocardiogram to follow-up on systolic murmur, however this does not have to be completed prior to procedure  CAD without angina: History of CABG 2002. Low risk nuclear stress test 2016. She denies chest pain, dyspnea, or other symptoms concerning for angina.  No  indication for further ischemic evaluation at this time.  No bleeding problems on aspirin. Continue aspirin, amlodipine, metoprolol, atorvastatin, ezetimibe.   Systolic murmur: History of  systolic heart murmur.  No evidence of mitral or aortic valve stenosis or insufficiency by echo 10/2018. Will repeat echo to evaluate for structural heart disease.   LBBB: LVEF 55 to 60% by echo 10/2018.  We will reassess as noted above and additionally to evaluate EF.   Cough: Nonproductive cough for 1 month. No evidence of volume overload on exam. Is very frustrated that cough persists despite various treatment options. Has follow-up with pulmonology in 3 days.   Hypertension: Not sleeping well, thinks elevated BP is related. PCP recently reduced metoprolol, likely due to sinus bradycardia. Amlodipine dose has varied from 5 mg to 10 mg, she is unsure of what she is currently taking. Will have her take amlodipine 10 mg daily on a consistent basis and continue to monitor BP. Asked her to notify us if BP remains consistently > 130/80.   Hyperlipidemia LDL goal < 70: LDL 80 on 08/18/20. We will recheck at the time of her echocardiogram. Continue atorvastatin.   Disposition:   6 months with Dr. Katrinka BlazingSmith   Medication Adjustments/Labs and Tests Ordered: Current medicines are reviewed at length with the patient today.  Concerns regarding medicines are outlined above.  Orders Placed This Encounter  Procedures   EKG 12-Lead   ECHOCARDIOGRAM COMPLETE   No orders of the defined types were placed in this encounter.   Patient Instructions  Medication Instructions:   Your physician recommends that you continue on your current medications as directed. Please refer to the Current Medication list given to you today.   *If you need a refill on your cardiac medications before your next appointment, please call your pharmacy*   Lab Work:  None ordered.  If you have labs (blood work) drawn today and your tests are  completely normal, you will receive your results only by: MyChart Message (if you have MyChart) OR A paper copy in the mail If you have any lab test that is abnormal or we need to change your treatment, we will call you to review the results.   Testing/Procedures:  Your physician has requested that you have an echocardiogram. Echocardiography is a painless test that uses sound waves to create images of your heart. It provides your doctor with information about the size and shape of your heart and how well your heart's chambers and valves are working. This procedure takes approximately one hour. There are no restrictions for this procedure.    Follow-Up: At [facility], you and your health needs are our priority.  As part of our continuing mission to provide you with exceptional heart care, we have created designated Provider Care Teams.  These Care Teams include your primary Cardiologist (physician) and Advanced Practice Providers (APPs -  Physician Assistants and Nurse Practitioners) who all work together to provide you with the care you need, when you need it.  We recommend signing up for the patient portal called "MyChart".  Sign up information is provided on this After Visit Summary.  MyChart is used to connect with patients for Virtual Visits (Telemedicine).  Patients are able to view lab/test results, encounter notes, upcoming appointments, etc.  Non-urgent messages can be sent to your provider as well.   To learn more about what you can do with MyChart, go to [email or web address].    Your next appointment:   6 month(s)  The format for your next appointment:   In Person  Provider:   Lesleigh NoeHenry W Smith III, MD     Other Instructions  Your physician wants  you to follow-up in: 6 month with Dr. Katrinka Blazing.  You will receive a reminder letter in the mail two months in advance. If you don't receive a letter, please call our office to schedule the follow-up appointment.   Important  Information About Sugar         Signed, Levi Aland, NP  08/09/2021 4:36 PM    [facility]

## 2021-08-09 ENCOUNTER — Encounter: Payer: Self-pay | Admitting: Nurse Practitioner

## 2021-08-09 ENCOUNTER — Ambulatory Visit (INDEPENDENT_AMBULATORY_CARE_PROVIDER_SITE_OTHER): Payer: Medicare Other | Admitting: Nurse Practitioner

## 2021-08-09 ENCOUNTER — Telehealth: Payer: Self-pay | Admitting: Internal Medicine

## 2021-08-09 VITALS — BP 140/90 | HR 57 | Ht 65.0 in | Wt 122.2 lb

## 2021-08-09 DIAGNOSIS — Z951 Presence of aortocoronary bypass graft: Secondary | ICD-10-CM | POA: Diagnosis not present

## 2021-08-09 DIAGNOSIS — I447 Left bundle-branch block, unspecified: Secondary | ICD-10-CM | POA: Diagnosis not present

## 2021-08-09 DIAGNOSIS — R011 Cardiac murmur, unspecified: Secondary | ICD-10-CM

## 2021-08-09 DIAGNOSIS — I2510 Atherosclerotic heart disease of native coronary artery without angina pectoris: Secondary | ICD-10-CM

## 2021-08-09 DIAGNOSIS — E785 Hyperlipidemia, unspecified: Secondary | ICD-10-CM

## 2021-08-09 DIAGNOSIS — R052 Subacute cough: Secondary | ICD-10-CM

## 2021-08-09 DIAGNOSIS — Z01810 Encounter for preprocedural cardiovascular examination: Secondary | ICD-10-CM

## 2021-08-09 DIAGNOSIS — I10 Essential (primary) hypertension: Secondary | ICD-10-CM

## 2021-08-09 NOTE — Telephone Encounter (Signed)
Scheduled pt for acute OV with Dr. Celine Mans on 08/12/21 at 4pm. Nothing further needed at this time.

## 2021-08-09 NOTE — Telephone Encounter (Signed)
Needs to be seen this week in [place] clinic with all meds in hand if can't get to see ent first

## 2021-08-09 NOTE — Patient Instructions (Signed)
Medication Instructions:   Your physician recommends that you continue on your current medications as directed. Please refer to the Current Medication list given to you today.   *If you need a refill on your cardiac medications before your next appointment, please call your pharmacy*   Lab Work:  None ordered.  If you have labs (blood work) drawn today and your tests are completely normal, you will receive your results only by: MyChart Message (if you have MyChart) OR A paper copy in the mail If you have any lab test that is abnormal or we need to change your treatment, we will call you to review the results.   Testing/Procedures:  Your physician has requested that you have an echocardiogram. Echocardiography is a painless test that uses sound waves to create images of your heart. It provides your doctor with information about the size and shape of your heart and how well your heart's chambers and valves are working. This procedure takes approximately one hour. There are no restrictions for this procedure.    Follow-Up: At [facility], you and your health needs are our priority.  As part of our continuing mission to provide you with exceptional heart care, we have created designated Provider Care Teams.  These Care Teams include your primary Cardiologist (physician) and Advanced Practice Providers (APPs -  Physician Assistants and Nurse Practitioners) who all work together to provide you with the care you need, when you need it.  We recommend signing up for the patient portal called "MyChart".  Sign up information is provided on this After Visit Summary.  MyChart is used to connect with patients for Virtual Visits (Telemedicine).  Patients are able to view lab/test results, encounter notes, upcoming appointments, etc.  Non-urgent messages can be sent to your provider as well.   To learn more about what you can do with MyChart, go to [email or web address].    Your next appointment:    6 month(s)  The format for your next appointment:   In Person  Provider:   Lesleigh Noe, MD     Other Instructions  Your physician wants you to follow-up in: 6 month with Dr. Katrinka Blazing.  You will receive a reminder letter in the mail two months in advance. If you don't receive a letter, please call our office to schedule the follow-up appointment.   Important Information About Sugar

## 2021-08-09 NOTE — Telephone Encounter (Signed)
Called patient and she states that she is worse. She states that her cough is getting worse and she is having mucus production now that is green. She states her head is starting to hurt more.    Please advise

## 2021-08-10 ENCOUNTER — Telehealth: Payer: Self-pay | Admitting: *Deleted

## 2021-08-10 ENCOUNTER — Other Ambulatory Visit: Payer: Self-pay | Admitting: *Deleted

## 2021-08-10 DIAGNOSIS — I10 Essential (primary) hypertension: Secondary | ICD-10-CM

## 2021-08-10 DIAGNOSIS — E785 Hyperlipidemia, unspecified: Secondary | ICD-10-CM

## 2021-08-10 DIAGNOSIS — Z79899 Other long term (current) drug therapy: Secondary | ICD-10-CM

## 2021-08-10 MED ORDER — AMLODIPINE BESYLATE 5 MG PO TABS: 10.0000 mg | ORAL_TABLET | Freq: Every day | ORAL | 11 refills | Status: DC

## 2021-08-10 NOTE — Telephone Encounter (Signed)
S/w pt is aware of recommendations. Pt will stay on Amlodipine ( 10 mg ) daily, medication list updated. Pt will come in for fasting lipid/cmet on day of echo, orders placed in system, appt made and linked.

## 2021-08-10 NOTE — Telephone Encounter (Signed)
-----   Message from Levi Aland, NP sent at 08/09/2021  4:13 PM EDT ----- Please schedule lipid and cmet on same day as her echo and advise her to take amlodipine 10 mg daily. I told her these things but then got distracted with having to go talk to Dr. Katrinka Blazing.  Thanks! Marcelino Duster

## 2021-08-11 LAB — HM COLONOSCOPY

## 2021-08-12 ENCOUNTER — Encounter: Payer: Self-pay | Admitting: Internal Medicine

## 2021-08-12 ENCOUNTER — Ambulatory Visit (INDEPENDENT_AMBULATORY_CARE_PROVIDER_SITE_OTHER): Payer: Medicare Other | Admitting: Internal Medicine

## 2021-08-12 ENCOUNTER — Encounter: Payer: Self-pay | Admitting: Family Medicine

## 2021-08-12 VITALS — BP 130/80 | HR 62 | Temp 98.2°F | Ht 62.0 in | Wt 116.6 lb

## 2021-08-12 DIAGNOSIS — R053 Chronic cough: Secondary | ICD-10-CM | POA: Diagnosis not present

## 2021-08-12 DIAGNOSIS — K219 Gastro-esophageal reflux disease without esophagitis: Secondary | ICD-10-CM | POA: Diagnosis not present

## 2021-08-12 DIAGNOSIS — I774 Celiac artery compression syndrome: Secondary | ICD-10-CM

## 2021-08-12 MED ORDER — HYDROCODONE BIT-HOMATROP MBR 5-1.5 MG/5ML PO SOLN: 5.0000 mL | Freq: Four times a day (QID) | ORAL | 0 refills | Status: DC | PRN

## 2021-08-12 NOTE — Patient Instructions (Signed)
Follow up with Dr. Sherene Sires as previously instructed.   Take dexilant at night before dinner take pepcid in the morning. If chlorpheniramine is not helping you can stop it.  I suspect the majority of your symptoms are from reflux so it's a lot of lifestyle modification.  Seeing the ENT doctor should help confirm this diagnosis.    What is GERD? Gastroesophageal reflux disease (GERD) is gastroesophageal reflux diseasewhich occurs when the lower esophageal sphincter (LES) opens spontaneously, for varying periods of time, or does not close properly and stomach contents rise up into the esophagus. GER is also called acid reflux or acid regurgitation, because digestive juices--called acids--rise up with the food. The esophagus is the tube that carries food from the mouth to the stomach. The LES is a ring of muscle at the bottom of the esophagus that acts like a valve between the esophagus and stomach.  When acid reflux occurs, food or fluid can be tasted in the back of the mouth. When refluxed stomach acid touches the lining of the esophagus it may cause a burning sensation in the chest or throat called heartburn or acid indigestion. Occasional reflux is common. Persistent reflux that occurs more than twice a week is considered GERD, and it can eventually lead to more serious health problems. People of all ages can have GERD. Studies have shown that GERD may worsen or contribute to asthma, chronic cough, and pulmonary fibrosis.   What are the symptoms of GERD? The main symptom of GERD in adults is frequent heartburn, also called acid indigestion--burning-type pain in the lower part of the mid-chest, behind the breast bone, and in the mid-abdomen.  Not all reflux is acidic in nature, and many patients don't have heart burn at all. Sometimes it feels like a cough (either dry or with mucus), choking sensation, asthma, shortness of breath, waking up at night, frequent throat clearing, or trouble swallowing.     What causes GERD? The reason some people develop GERD is still unclear. However, research shows that in people with GERD, the LES relaxes while the rest of the esophagus is working. Anatomical abnormalities such as a hiatal hernia may also contribute to GERD. A hiatal hernia occurs when the upper part of the stomach and the LES move above the diaphragm, the muscle wall that separates the stomach from the chest. Normally, the diaphragm helps the LES keep acid from rising up into the esophagus. When a hiatal hernia is present, acid reflux can occur more easily. A hiatal hernia can occur in people of any age and is most often a normal finding in otherwise healthy people over age 51. Most of the time, a hiatal hernia produces no symptoms.   Other factors that may contribute to GERD include - Obesity or recent weight gain - Pregnancy  - Smoking  - Diet - Certain medications  Common foods that can worsen reflux symptoms include: - carbonated beverages - artificial sweeteners - citrus fruits  - chocolate  - drinks with caffeine or alcohol  - fatty and fried foods  - garlic and onions  - mint flavorings  - spicy foods  - tomato-based foods, like spaghetti sauce, salsa, chili, and pizza   Lifestyle Changes If you smoke, stop.  Avoid foods and beverages that worsen symptoms (see above.) Lose weight if needed.  Eat small, frequent meals.  Wear loose-fitting clothes.  Avoid lying down for 3 hours after a meal.  Raise the head of your bed 6 to 8 inches by  securing wood blocks under the bedposts. Just using extra pillows will not help, but using a wedge-shaped pillow may be helpful.  Medications  H2 blockers, such as cimetidine (Tagamet HB), famotidine (Pepcid AC), nizatidine (Axid AR), and ranitidine (Zantac 75), decrease acid production. They are available in prescription strength and over-the-counter strength. These drugs provide short-term relief and are effective for about half of those  who have GERD symptoms.  Proton pump inhibitors include omeprazole (Prilosec, Zegerid), lansoprazole (Prevacid), pantoprazole (Protonix), rabeprazole (Aciphex), and esomeprazole (Nexium), which are available by prescription. Prilosec is also available in over-the-counter strength. Proton pump inhibitors are more effective than H2 blockers and can relieve symptoms and heal the esophageal lining in almost everyone who has GERD.  Because drugs work in different ways, combinations of medications may help control symptoms. People who get heartburn after eating may take both antacids and H2 blockers. The antacids work first to neutralize the acid in the stomach, and then the H2 blockers act on acid production. By the time the antacid stops working, the H2 blocker will have stopped acid production. Your health care provider is the best source of information about how to use medications for GERD.   Points to Remember 1. You can have GERD without having heartburn. Your symptoms could include a dry cough, asthma symptoms, or trouble swallowing.  2. Taking medications daily as prescribed is important in controlling you symptoms.  Sometimes it can take up to 8 weeks to fully achieve the effects of the medications prescribed.  3. Coughing related to GERD can be difficult to treat and is very frustrating!  However, it is important to stick with these medications and lifestyle modifications before pursuing more aggressive or invasive test and treatments.

## 2021-08-12 NOTE — Progress Notes (Signed)
Karen Saunders    [id number]    22-[name]-1955  Primary Care Physician:Duncan, Dwana Curd, MD Date of Appointment: 08/12/2021 Established Patient Visit  Chief complaint:   Chief Complaint  Patient presents with   Acute Visit    Acute: Cough, congestion     HPI: Karen Saunders is a 66 y.o.a woman with chronic cough who sees Dr. Sherene Sires.   Interval Updates: She established with Dr. Sherene Sires two weeks ago and is now here for acute visit. Was diagnosed with upper airway cough syndrome. Symptoms have been going on for over a month.  A ct sinus was ordered which showed septal deviation to the right, minimal mucosal thickening with patent airways. Chest xray shows no acute cardiopulmonary disease.   Was taking tessalon pearls, pepcid, xyzal, dexilant. Was also started on chlorpheniramine 4 mg when she called in last week. She tried chlorpheniramine and this didn't help.  Tessalon pearls aren't helping.   Previously had tried phenergan with PCP for cough suppression which did not help.   A referral to ENT is pending. She sees ENT for tinnitus at [facility] but wants to see someone locally quickly for her cough.   She has not yet had PFTs.   She has elevated the head of her bed to help with reflux.   She denies shortness of breath or prodromal viral infection.   She works in a Industrial/product designer.   Denies post nasal drainage, reflux. Having headaches from cough.  Cough is actually worse at night. She is sleeping upright.    I have reviewed the patient's family social and past medical history and updated as appropriate.   Past Medical History:  Diagnosis Date   Anemia    with prev unremarkable heme eval ~2011   CAD (coronary artery disease)    Dr. Donnie Aho with Cards   History of gastric ulcer    2005    Hyperlipidemia    Hypertension    IBS (irritable bowel syndrome)    Leukopenia    with prev unremarkable heme eval ~2011   Migraine    Unsp w/o intract w/o status migrainosus    Osteoporosis 11/18/2004   Shingles    Trigeminal neuralgia 03/05/2012    Past Surgical History:  Procedure Laterality Date   ABDOMINAL HYSTERECTOMY  2002   CORONARY ARTERY BYPASS GRAFT  2002   LUMBAR SPINE SURGERY     Stress cardiolite  09/11/2008   Probably normal with breast attenuation noted but no evidence of ischemia   TONSILLECTOMY  ~ 1965    Family History  Problem Relation Age of Onset   Arthritis Mother    Diabetes Mother    Hypertension Mother    Dementia Mother    Stroke Father    Heart disease Father        MI   Diabetes Father    Hypertension Father    Colon cancer Neg Hx    Breast cancer Neg Hx     Social History   Occupational History   Occupation: MEDICAID SPEC    Employer: [employer]   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  Gen:      No acute distress, frequent dry cough and throat clearing.  ENT:  no nasal polyps, mucus membranes moist Lungs:    No increased respiratory effort, symmetric chest wall excursion, clear to auscultation bilaterally, no wheezes or crackles CV:         Regular rate and rhythm; no murmurs, rubs, or gallops.  No pedal edema   Data Reviewed: Imaging: I have personally reviewed the chest xrayJune 2023 - no acute process CT sinuses -July 2023 - mild sinus disease.   PFTs:  Labs:  Immunization status: Immunization History  Administered Date(s) Administered   Influenza Whole 10/10/2009, 10/11/2011, 10/17/2012   Influenza,inj,Quad PF,6+ Mos 10/26/2016, 11/09/2019,  11/04/2020   Influenza,inj,Quad PF,6-35 Mos 11/10/2015, 10/24/2017, 10/17/2018   Influenza,inj,quad, With Preservative 11/08/2013, 11/05/2014   Influenza-Unspecified 11/08/2013, 10/29/2014, 11/05/2015, 10/10/2017   PFIZER(Purple Top)SARS-COV-2 Vaccination 02/14/2019, 03/08/2019, 01/28/2020   PNEUMOCOCCAL CONJUGATE-20 12/07/2020   Pneumococcal Polysaccharide-23 11/09/2010   Td 01/11/2008    External Records Personally Reviewed: pulmonary, pcp  Assessment:  Chronic cough GERD UACS  Plan/Recommendations: Hycodan cough syrup for night time use to help with sleep for the short term only.  Take dexilant at night before dinner take pepcid in the morning. If chlorpheniramine is not helping you can stop it.  I suspect the majority of your symptoms are from reflux so it's a lot of lifestyle modification.  Seeing the ENT doctor should help confirm this diagnosis.    Return to Care: Follow up with Dr. 03/10/2008 as previously instructed.    Sherene Sires, MD Pulmonary and Critical Care Medicine [facility] Office:[phone]

## 2021-08-13 ENCOUNTER — Telehealth: Payer: Self-pay | Admitting: Internal Medicine

## 2021-08-13 MED ORDER — HYDROCODONE BIT-HOMATROP MBR 5-1.5 MG/5ML PO SOLN: 5.0000 mL | Freq: Four times a day (QID) | ORAL | 0 refills | Status: DC | PRN

## 2021-08-13 NOTE — Telephone Encounter (Signed)
Called [facility] on [address]. Pharmacy tech stated that she could not transfer it because of the type of medication it is. She stated that she could delete the order they had and the doctor [facility] [address]   ND, please advise.

## 2021-08-13 NOTE — Telephone Encounter (Signed)
Called and spoke with patient. Patient verbalized understanding. Nothing further needed.  

## 2021-08-13 NOTE — Telephone Encounter (Signed)
Patient is calling and stating that the [facility] [address] is out of the Hycodan cough syrup. But she is asking if you could send the prescription to [facility] [address] instead.  Please advise

## 2021-08-16 ENCOUNTER — Other Ambulatory Visit: Payer: Self-pay | Admitting: Interventional Cardiology

## 2021-08-17 ENCOUNTER — Other Ambulatory Visit: Payer: Medicare Other

## 2021-08-19 ENCOUNTER — Other Ambulatory Visit: Payer: Self-pay | Admitting: Interventional Cardiology

## 2021-08-19 DIAGNOSIS — Z951 Presence of aortocoronary bypass graft: Secondary | ICD-10-CM

## 2021-08-19 DIAGNOSIS — E782 Mixed hyperlipidemia: Secondary | ICD-10-CM

## 2021-08-19 DIAGNOSIS — I10 Essential (primary) hypertension: Secondary | ICD-10-CM

## 2021-08-19 DIAGNOSIS — I2510 Atherosclerotic heart disease of native coronary artery without angina pectoris: Secondary | ICD-10-CM

## 2021-08-24 ENCOUNTER — Other Ambulatory Visit: Payer: Medicare Other

## 2021-08-24 ENCOUNTER — Ambulatory Visit (HOSPITAL_COMMUNITY): Payer: Medicare Other | Attending: Cardiovascular Disease

## 2021-08-24 DIAGNOSIS — R011 Cardiac murmur, unspecified: Secondary | ICD-10-CM | POA: Insufficient documentation

## 2021-08-24 DIAGNOSIS — Z01810 Encounter for preprocedural cardiovascular examination: Secondary | ICD-10-CM | POA: Diagnosis not present

## 2021-08-24 DIAGNOSIS — I10 Essential (primary) hypertension: Secondary | ICD-10-CM

## 2021-08-24 DIAGNOSIS — Z79899 Other long term (current) drug therapy: Secondary | ICD-10-CM

## 2021-08-24 DIAGNOSIS — E785 Hyperlipidemia, unspecified: Secondary | ICD-10-CM

## 2021-08-24 LAB — COMPREHENSIVE METABOLIC PANEL
ALT: 27 IU/L (ref 0–32)
AST: 36 IU/L (ref 0–40)
Albumin/Globulin Ratio: 2 (ref 1.2–2.2)
Albumin: 4.5 g/dL (ref 3.9–4.9)
Alkaline Phosphatase: 101 IU/L (ref 44–121)
BUN/Creatinine Ratio: 19 (ref 12–28)
BUN: 20 mg/dL (ref 8–27)
Bilirubin Total: 0.3 mg/dL (ref 0.0–1.2)
CO2: 25 mmol/L (ref 20–29)
Calcium: 9.4 mg/dL (ref 8.7–10.3)
Chloride: 88 mmol/L — ABNORMAL LOW (ref 96–106)
Creatinine, Ser: 1.07 mg/dL — ABNORMAL HIGH (ref 0.57–1.00)
Globulin, Total: 2.2 g/dL (ref 1.5–4.5)
Glucose: 95 mg/dL (ref 70–99)
Potassium: 3.9 mmol/L (ref 3.5–5.2)
Sodium: 129 mmol/L — ABNORMAL LOW (ref 134–144)
Total Protein: 6.7 g/dL (ref 6.0–8.5)
eGFR: 57 mL/min/{1.73_m2} — ABNORMAL LOW (ref >59)

## 2021-08-24 LAB — ECHOCARDIOGRAM COMPLETE
Area-P 1/2: 2.17 cm2
S' Lateral: 2.5 cm

## 2021-08-24 LAB — LIPID PANEL
Chol/HDL Ratio: 2.5 ratio (ref 0.0–4.4)
Cholesterol, Total: 205 mg/dL — ABNORMAL HIGH (ref 100–199)
HDL: 81 mg/dL (ref >39)
LDL Chol Calc (NIH): 110 mg/dL — ABNORMAL HIGH (ref 0–99)
Triglycerides: 76 mg/dL (ref 0–149)
VLDL Cholesterol Cal: 14 mg/dL (ref 5–40)

## 2021-08-25 ENCOUNTER — Encounter: Payer: Self-pay | Admitting: Family Medicine

## 2021-08-25 NOTE — Telephone Encounter (Signed)
Please see your MyChart message about valsartan/hydrochlorothiazide versus hydrochlorothiazide and advise.  Thanks.

## 2021-08-27 ENCOUNTER — Other Ambulatory Visit: Payer: Self-pay | Admitting: *Deleted

## 2021-08-27 ENCOUNTER — Telehealth: Payer: Self-pay | Admitting: Internal Medicine

## 2021-08-27 DIAGNOSIS — R058 Other specified cough: Secondary | ICD-10-CM

## 2021-08-27 DIAGNOSIS — E785 Hyperlipidemia, unspecified: Secondary | ICD-10-CM

## 2021-08-27 MED ORDER — BENZONATATE 200 MG PO CAPS: 200.0000 mg | ORAL_CAPSULE | Freq: Three times a day (TID) | ORAL | 1 refills | Status: DC | PRN

## 2021-08-27 MED ORDER — ROSUVASTATIN CALCIUM 40 MG PO TABS: 40.0000 mg | ORAL_TABLET | Freq: Every day | ORAL | 11 refills | Status: DC

## 2021-08-27 NOTE — Telephone Encounter (Signed)
Spoke with pt who states she believes Hycodan is making her sick. She said the 1st and 2nd time she took medication she was fine but the last couple of times after taking the Hycodan she vomited. Pt states that she tried taking medication with food to see if that helped but it did not improve her outcome. Pt states she is still coughing at night. Dr. Celine Mans please advise.

## 2021-08-27 NOTE — Telephone Encounter (Signed)
ATC LVMTCB x 1  

## 2021-08-27 NOTE — Telephone Encounter (Signed)
It is an opioid cough suppressant so it does have the potential to cause nausea in some patients. If taking with food is not helpful she should probably stop taking it. Any opioid cough suppressant will cause this. She can keep taking the tessalon capsules until she sees Dr. Sherene Sires since she has a listed allergy to dextromethorphan.

## 2021-08-27 NOTE — Telephone Encounter (Signed)
Called and spoke with pt letting her know recs per ND and she verbalized understanding.nothing further needed.

## 2021-08-28 NOTE — Progress Notes (Unsigned)
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  Pt c/o dry cough for the past month, worse at night when lies down and wakes up coughing.   Dyspnea:  Not limited by breathing from desired activities   Cough: much worse immediately when supine now @  45 degrees / no production Sleep: at least once around 2 am every night s fail  SABA use: none  Abx/pred/gerd rx no better Rec For cough cough take tessalon 200 mg up to 3 x daily  Prednisone 10 mg take  4 each am x 2 days,   2 each am x 2 days,  1 each am x 2 days and stop Stop the xyzal and For drainage / throat tickle try take CHLORPHENIRAMINE  4 mg  Add pepcid 20 mg at hour before  bed when you take your chlorpheniramine Continue dexilant 60 mg Take 30-60 min before first meal of the day  GERD diet  Please remember to go to the lab department     PANCYTOPENIA. Please schedule a follow up office visit in 4 weeks, sooner if needed  with all medications /inhalers/ solutions in hand     08/30/2021  f/u ov/Karen Saunders re: may 2023  maint on tessalon 200 mg tid   f/u PANCYTOPENIA/ did not take h1's as rec  Chief Complaint  Patient presents with   Follow-up    Cough is unchanged. Cough is non prod and wakes her up every night.   Dyspnea:  Not limited by breathing from desired activities    Cough: some during day not productive and worse immediately on lying down or shortly thereafter  Sleeping: bed blocks/ 2 pillows  SABA use: no  02: none      No obvious day to day or daytime variability or assoc excess/ purulent sputum or mucus plugs or hemoptysis or cp or chest tightness, subjective wheeze or overt sinus or hb symptoms.   *** without nocturnal  or early am exacerbation  of respiratory  c/o's or need for noct saba. Also denies any obvious fluctuation of symptoms with weather or environmental changes or other aggravating or alleviating factors except as outlined above   No unusual exposure hx or h/o childhood pna/ asthma or knowledge of premature birth.  Current Allergies, Complete Past Medical History, Past Surgical History, Family History, and Social History were reviewed in [employer] record.  ROS  The following are not active complaints unless bolded Hoarseness, sore throat, dysphagia, dental problems, itching, sneezing,  nasal congestion or discharge of excess mucus or purulent secretions, ear ache,   fever, chills, sweats, unintended wt loss or wt gain, classically pleuritic or exertional cp,  orthopnea pnd or arm/hand swelling  or leg swelling, presyncope, palpitations, abdominal pain, anorexia, nausea, vomiting, diarrhea  or change in bowel habits or change in bladder habits, change in stools or change in urine, dysuria, hematuria,  rash, arthralgias, visual complaints, headache, numbness, weakness or ataxia or problems with walking or coordination,  change in mood or  memory.        Current Meds  Medication Sig   amLODipine (NORVASC) 5 MG tablet Take 2 tablets (10 mg total) by mouth daily.   aspirin 81 MG tablet Take 81 mg by mouth daily.   benzonatate (TESSALON) 200 MG capsule Take 1 capsule (200 mg total) by mouth 3 (three) times daily as needed for cough.   carbamazepine (TEGRETOL) 200 MG tablet TAKE 2 TABLETS IN THE MORNING AND 2 TABLETS  AT NIGHT   dexlansoprazole (DEXILANT) 60 MG capsule Take 60 mg by mouth daily.   ezetimibe (ZETIA) 10 MG tablet TAKE 1 TABLET EVERY DAY   famotidine (PEPCID) 20 MG tablet Take 1 tablet (20 mg total) by mouth 2 (two) times daily.   hydrochlorothiazide (HYDRODIURIL) 25 MG tablet Take 1 tablet (25 mg total) by mouth daily.   HYDROcodone bit-homatropine (HYCODAN) 5-1.5 MG/5ML syrup Take 5 mLs by mouth every 6 (six) hours as needed for cough.   levocetirizine (XYZAL) 5 MG tablet Take 5 mg by mouth daily.   Melatonin 3 MG TABS Take 1 tablet (3 mg total) by mouth at bedtime as needed.   metoprolol tartrate (LOPRESSOR) 50 MG tablet Take 0.5 tablets (25 mg total) by mouth 2 (two) times daily.   ondansetron (ZOFRAN) 4 MG tablet Take 4 mg by mouth every 8 (eight) hours as needed for nausea or vomiting.   rosuvastatin (CRESTOR) 40 MG tablet Take 1 tablet (40 mg total) by mouth daily.   spironolactone (ALDACTONE) 25 MG tablet TAKE 1 TABLET BY MOUTH EVERY DAY         Past Medical History:  Diagnosis Date   Anemia    with prev unremarkable heme eval ~2011   CAD (coronary artery disease)    Dr. Donnie Aho with Cards   History of gastric ulcer    2005    Hyperlipidemia    Hypertension    IBS (irritable bowel syndrome)    Leukopenia    with prev unremarkable heme eval ~2011   Migraine    Unsp w/o intract w/o status migrainosus   Osteoporosis 11/18/2004   Shingles    Trigeminal neuralgia 03/05/2012     Objective:     08/30/2021        ***   08/12/21 116 lb 9.6 oz (52.9 kg)  08/09/21 122 lb 3.2 oz (55.4 kg)  07/22/21 117 lb (53.1 kg)      Vital signs reviewed  08/30/2021  - Note at rest 02 sats  ***% on ***   General appearance:    ***      Assessment

## 2021-08-30 ENCOUNTER — Encounter: Payer: Self-pay | Admitting: Internal Medicine

## 2021-08-30 ENCOUNTER — Ambulatory Visit (INDEPENDENT_AMBULATORY_CARE_PROVIDER_SITE_OTHER): Payer: Medicare Other | Admitting: Internal Medicine

## 2021-08-30 DIAGNOSIS — R058 Other specified cough: Secondary | ICD-10-CM | POA: Diagnosis not present

## 2021-08-30 NOTE — Patient Instructions (Addendum)
For drainage / throat tickle try take CHLORPHENIRAMINE  4 mg  ("Allergy Relief" 4mg   at [facility] should be easiest to find in the blue box usually on bottom shelf)  take one every 4 hours as needed - extremely effective and inexpensive over the counter- may cause drowsiness so start with just a dose or two an hour before bedtime and see how you tolerate it before trying in daytime.   Keep your appt to see your ENT doctor and use the jolley ranchers during the day  [facility] is the  [facility] expert for your type of cough -  if it remains elusive in terms of responding to treatments recommended, please call for referral.

## 2021-08-31 ENCOUNTER — Encounter: Payer: Self-pay | Admitting: Internal Medicine

## 2021-08-31 ENCOUNTER — Other Ambulatory Visit: Payer: Self-pay

## 2021-08-31 MED ORDER — AMLODIPINE BESYLATE 5 MG PO TABS: 10.0000 mg | ORAL_TABLET | Freq: Every day | ORAL | 11 refills | Status: DC

## 2021-08-31 NOTE — Assessment & Plan Note (Addendum)
Onset ?  Decades with prior ENT / Allergy evaluation and maint on xyzal and dexilant at 1st pulmonary eval  07/22/21  -  Sinus ct  07/28/21  Minimal mucosal thickening within the bilateral ethmoid and maxillary sinuses. Mild mucosal thickening within the bilateral nasal passages.Rightward deviation of the nasal septum, anteriorly. -  Allergy screen 07/22/2021 >  Eos 0.0 /  IgE  < 2  - 07/22/2021 cyclical cough rx with max gerd rx/ pred x 6 d and  tessalon to start >> returned 08/30/2021 with mostly noct cough and did not follow instructions on rx with 1st gen H1 so rec 1st gen H1 blockers per guidelines  X 2 at hs and repeat as needed   Agree with ent eval but if not fruitful would rever to the voice center at [facility], Dr Harriette Ohara, who has a special interest in the pattern of coughing and typically recs gabapentin trial which I did review with pt today.   Elavil at hs can also be helpful for noct cough usually starting at 10 mg and titrating up   Pulmonary f/u can be prn          Each maintenance medication was reviewed in detail including emphasizing most importantly the difference between maintenance and prns and under what circumstances the prns are to be triggered using an action plan format where appropriate.  Total time for H and P, chart review, counseling, reviewing options for rx  and generating customized AVS unique to this office visit / same day charting  > 30 min summary final pulmonary f/u

## 2021-09-09 DIAGNOSIS — H9313 Tinnitus, bilateral: Secondary | ICD-10-CM | POA: Insufficient documentation

## 2021-09-17 ENCOUNTER — Encounter: Payer: Self-pay | Admitting: Family Medicine

## 2021-09-20 ENCOUNTER — Telehealth: Payer: Self-pay | Admitting: Family Medicine

## 2021-09-20 DIAGNOSIS — R059 Cough, unspecified: Secondary | ICD-10-CM

## 2021-09-20 NOTE — Telephone Encounter (Signed)
What is the status on her ENT referral?  Please let me know.  Thanks.

## 2021-09-20 NOTE — Telephone Encounter (Signed)
Please see referral notes in Epic.  Referral was handled by another referral coordinator 07/19/2021 Referral was processed and faxed to Dr Rosine Door (pt request) and patient was notified of where to call to schedule her appt.  Referral Coordinator personally contacted the patient and advised her of where her referral was sent (Provider of her request) and she was to call to schedule her appt.

## 2021-09-20 NOTE — Telephone Encounter (Signed)
Dr. Tama Headings need your input about this patient.  She continues to cough and I would appreciate your advice.

## 2021-09-20 NOTE — Telephone Encounter (Signed)
See message to patient - referral refaxed since it was last faxed in July.

## 2021-09-20 NOTE — Telephone Encounter (Signed)
She has a refactory upper airway cough (not a pulmonary problem that I can detect)  that is best eval at this point by local ent and if they are not helpful:   would refer to the voice center at [facility], Dr Harriette Ohara, who has a special interest in the pattern of coughing and typically recs gabapentin trial which I did review with pt today.   Elavil at hs can also be helpful for noct cough usually starting at 10 mg and titrating up   No other thoughts at this point - happy to arrange referral to [facility] if you/ she desire

## 2021-09-20 NOTE — Telephone Encounter (Signed)
Noted. Thanks.

## 2021-09-21 NOTE — Telephone Encounter (Signed)
See below.   My understanding is that the referral coordinators have been in contact with patient.  Please check with patient to see if she has been able to get scheduled with ENT in the meantime.  Thanks.

## 2021-09-21 NOTE — Telephone Encounter (Signed)
Contacted patient and she has not called the ENT office yet since she just got the message yesterday. She is planning on calling them today.

## 2021-09-21 NOTE — Telephone Encounter (Signed)
Noted. Thanks.

## 2021-09-21 NOTE — Telephone Encounter (Signed)
Pt called in stated she call to set up appointment with Dr. Betsey Amen. And was told he dose not see pt for cough. Wants to know next steps. Please Advise [phone]

## 2021-09-22 ENCOUNTER — Other Ambulatory Visit: Payer: Self-pay | Admitting: Interventional Cardiology

## 2021-09-22 NOTE — Telephone Encounter (Signed)
Looks like Dr Betsey Amen (ENT) denied patient referral d/t diagnosis  R05.9 (ICD-10-CM) - Cough, unspecified type

## 2021-09-22 NOTE — Addendum Note (Signed)
Addended by: Joaquim Nam on: 09/22/2021 01:20 PM   Modules accepted: Orders

## 2021-09-22 NOTE — Telephone Encounter (Signed)
Per Dr. Sherene Sires- would refer to the voice center at [facility], Dr Harriette Ohara, who has a special interest in the pattern of coughing.    I put in that referral.  Thanks.

## 2021-09-23 NOTE — Telephone Encounter (Signed)
Noted  

## 2021-10-08 ENCOUNTER — Encounter: Payer: Self-pay | Admitting: Family Medicine

## 2021-10-08 ENCOUNTER — Other Ambulatory Visit: Payer: Self-pay | Admitting: Family Medicine

## 2021-10-08 MED ORDER — TRAZODONE HCL 50 MG PO TABS: 25.0000 mg | ORAL_TABLET | Freq: Every evening | ORAL | 3 refills | Status: DC | PRN

## 2021-10-08 NOTE — Telephone Encounter (Signed)
Patient called and stated that the Dr. Carol Ada office has not received a referral. Call back number [phone].

## 2021-10-14 NOTE — Telephone Encounter (Signed)
Noted. Will refax the referral.

## 2021-10-18 ENCOUNTER — Ambulatory Visit: Payer: Medicare Other | Admitting: Family Medicine

## 2021-10-20 ENCOUNTER — Encounter: Payer: Self-pay | Admitting: Family Medicine

## 2021-10-20 ENCOUNTER — Ambulatory Visit (INDEPENDENT_AMBULATORY_CARE_PROVIDER_SITE_OTHER): Payer: Medicare Other | Admitting: Family Medicine

## 2021-10-20 ENCOUNTER — Telehealth: Payer: Self-pay | Admitting: Family Medicine

## 2021-10-20 VITALS — BP 122/78 | HR 53 | Temp 98.0°F | Ht 62.0 in | Wt 115.0 lb

## 2021-10-20 DIAGNOSIS — Z23 Encounter for immunization: Secondary | ICD-10-CM

## 2021-10-20 DIAGNOSIS — E871 Hypo-osmolality and hyponatremia: Secondary | ICD-10-CM

## 2021-10-20 DIAGNOSIS — R350 Frequency of micturition: Secondary | ICD-10-CM | POA: Diagnosis not present

## 2021-10-20 LAB — BASIC METABOLIC PANEL
BUN: 12 mg/dL (ref 6–23)
CO2: 31 mEq/L (ref 19–32)
Calcium: 9.5 mg/dL (ref 8.4–10.5)
Chloride: 95 mEq/L — ABNORMAL LOW (ref 96–112)
Creatinine, Ser: 0.96 mg/dL (ref 0.40–1.20)
GFR: 61.01 mL/min (ref >60.00)
Glucose, Bld: 88 mg/dL (ref 70–99)
Potassium: 3.9 mEq/L (ref 3.5–5.1)
Sodium: 134 mEq/L — ABNORMAL LOW (ref 135–145)

## 2021-10-20 LAB — POC URINALSYSI DIPSTICK (AUTOMATED)
Bilirubin, UA: NEGATIVE
Blood, UA: NEGATIVE
Glucose, UA: NEGATIVE
Ketones, UA: NEGATIVE
Leukocytes, UA: NEGATIVE
Nitrite, UA: NEGATIVE
Protein, UA: NEGATIVE
Spec Grav, UA: 1.015 (ref 1.010–1.025)
Urobilinogen, UA: 0.2 E.U./dL
pH, UA: 6 (ref 5.0–8.0)

## 2021-10-20 NOTE — Progress Notes (Signed)
Patient ID: Karen Saunders, female    DOB: [birth year], 66 y.o.   MRN: [id number]  This visit was conducted in person.  BP 122/78   Pulse (!) 53   Temp 98 F (36.7 C) (Temporal)   Ht 5\' 2"  (1.575 m)   Wt 115 lb (52.2 kg)   SpO2 97%   BMI 21.03 kg/m    CC:  Chief Complaint  Patient presents with   Urinary Frequency    X 3 weeks, no other Sx. Un-related cough X 3 months     Subjective:   HPI: Karen Saunders is a 66 y.o. female presenting on 10/20/2021 for Urinary Frequency (X 3 weeks, no other Sx. Un-related cough X 3 months )   Urinary Frequency  This is a new problem. The current episode started 1 to 4 weeks ago. The problem has been unchanged. The patient is experiencing no pain. There has been no fever. She is Not sexually active. There is No history of pyelonephritis. Associated symptoms include frequency and urgency. Pertinent negatives include no chills, discharge, flank pain, hematuria, nausea, sweats or vomiting. Associated symptoms comments: Nocturia.Marland Kitchen three times a night. She has tried increased fluids for the symptoms. The treatment provided no relief.    No new  medications. She has been on HCTZ and spironolactone for awhile for a while.   No  new changes in diet.. minimal acdic foods.  No ETOH, soda.. has stopped sweet tea and changed to cranberry.  Not taking NSAIDs.  Drinking a lot of water.    In review of past labs... noted low sodium without any further check... may in part be due to HCTZ.  Sodium was low 129 in 08/2021... needs re-eval.   Relevant past medical, surgical, family and social history reviewed and updated as indicated. Interim medical history since our last visit reviewed. Allergies and medications reviewed and updated. Outpatient Medications Prior to Visit  Medication Sig Dispense Refill   amLODipine (NORVASC) 5 MG tablet Take 2 tablets (10 mg total) by mouth daily. 30 tablet 11   aspirin 81 MG tablet Take 81 mg by mouth daily.      benzonatate (TESSALON) 200 MG capsule Take 1 capsule (200 mg total) by mouth 3 (three) times daily as needed for cough. 30 capsule 1   carbamazepine (TEGRETOL) 200 MG tablet TAKE 2 TABLETS IN THE MORNING AND 2 TABLETS AT NIGHT     dexlansoprazole (DEXILANT) 60 MG capsule Take 60 mg by mouth daily.     ezetimibe (ZETIA) 10 MG tablet TAKE 1 TABLET EVERY DAY 90 tablet 3   famotidine (PEPCID) 20 MG tablet Take 1 tablet (20 mg total) by mouth 2 (two) times daily.     hydrochlorothiazide (HYDRODIURIL) 25 MG tablet Take 1 tablet (25 mg total) by mouth daily. 90 tablet 3   HYDROcodone bit-homatropine (HYCODAN) 5-1.5 MG/5ML syrup Take 5 mLs by mouth every 6 (six) hours as needed for cough. (Patient not taking: Reported on 10/20/2021)     levocetirizine (XYZAL) 5 MG tablet Take 5 mg by mouth daily.     Melatonin 3 MG TABS Take 1 tablet (3 mg total) by mouth at bedtime as needed. (Patient not taking: Reported on 10/20/2021)     metoprolol tartrate (LOPRESSOR) 50 MG tablet TAKE 1 TABLET BY MOUTH TWICE A DAY 180 tablet 1   ondansetron (ZOFRAN) 4 MG tablet Take 4 mg by mouth every 8 (eight) hours as needed for nausea or vomiting. (Patient not taking:  Reported on 10/20/2021)     rosuvastatin (CRESTOR) 40 MG tablet Take 1 tablet (40 mg total) by mouth daily. 30 tablet 11   spironolactone (ALDACTONE) 25 MG tablet TAKE 1 TABLET BY MOUTH EVERY DAY 90 tablet 3   traZODone (DESYREL) 50 MG tablet Take 0.5-1 tablets (25-50 mg total) by mouth at bedtime as needed for sleep. 30 tablet 3   No facility-administered medications prior to visit.     Per HPI unless specifically indicated in ROS section below Review of Systems  Constitutional:  Negative for chills.  Gastrointestinal:  Negative for nausea and vomiting.  Genitourinary:  Positive for frequency and urgency. Negative for flank pain and hematuria.   Objective:  BP 122/78   Pulse (!) 53   Temp 98 F (36.7 C) (Temporal)   Ht 5\' 2"  (1.575 m)   Wt 115 lb (52.2  kg)   SpO2 97%   BMI 21.03 kg/m   Wt Readings from Last 3 Encounters:  10/20/21 115 lb (52.2 kg)  08/30/21 114 lb (51.7 kg)  08/12/21 116 lb 9.6 oz (52.9 kg)      Physical Exam Constitutional:      General: She is not in acute distress.    Appearance: Normal appearance. She is well-developed. She is not ill-appearing or toxic-appearing.  HENT:     Head: Normocephalic.     Right Ear: Hearing, tympanic membrane, ear canal and external ear normal. Tympanic membrane is not erythematous, retracted or bulging.     Left Ear: Hearing, tympanic membrane, ear canal and external ear normal. Tympanic membrane is not erythematous, retracted or bulging.     Nose: No mucosal edema or rhinorrhea.     Right Sinus: No maxillary sinus tenderness or frontal sinus tenderness.     Left Sinus: No maxillary sinus tenderness or frontal sinus tenderness.     Mouth/Throat:     Pharynx: Uvula midline.  Eyes:     General: Lids are normal. Lids are everted, no foreign bodies appreciated.     Conjunctiva/sclera: Conjunctivae normal.     Pupils: Pupils are equal, round, and reactive to light.  Neck:     Thyroid: No thyroid mass or thyromegaly.     Vascular: No carotid bruit.     Trachea: Trachea normal.  Cardiovascular:     Rate and Rhythm: Normal rate and regular rhythm.     Pulses: Normal pulses.     Heart sounds: Normal heart sounds, S1 normal and S2 normal. No murmur heard.    No friction rub. No gallop.  Pulmonary:     Effort: Pulmonary effort is normal. No tachypnea or respiratory distress.     Breath sounds: Normal breath sounds. No decreased breath sounds, wheezing, rhonchi or rales.  Abdominal:     General: Bowel sounds are normal.     Palpations: Abdomen is soft.     Tenderness: There is no abdominal tenderness.  Musculoskeletal:     Cervical back: Normal range of motion and neck supple.  Skin:    General: Skin is warm and dry.     Findings: No rash.  Neurological:     Mental Status: She  is alert.  Psychiatric:        Mood and Affect: Mood is not anxious or depressed.        Speech: Speech normal.        Behavior: Behavior normal. Behavior is cooperative.        Thought Content: Thought content normal.  Judgment: Judgment normal.       Results for orders placed or performed in visit on 10/20/21  POCT Urinalysis Dipstick (Automated)  Result Value Ref Range   Color, UA Yellow    Clarity, UA Clear    Glucose, UA Negative Negative   Bilirubin, UA Negative    Ketones, UA Negative    Spec Grav, UA 1.015 1.010 - 1.025   Blood, UA Negative    pH, UA 6.0 5.0 - 8.0   Protein, UA Negative Negative   Urobilinogen, UA 0.2 0.2 or 1.0 E.U./dL   Nitrite, UA Negative    Leukocytes, UA Negative Negative     COVID 19 screen:  No recent travel or known exposure to COVID19 The patient denies respiratory symptoms of COVID 19 at this time. The importance of social distancing was discussed today.   Assessment and Plan Problem List Items Addressed This Visit     Hyponatremia    Acute noted in August labs done at outside MD. No clear follow-up.  She was told to decrease water intake. I will recheck her sodium today.  This could be another reason to potentially alter her HCTZ and spironolactone.      Relevant Orders   Basic Metabolic Panel   Urinary frequency - Primary    Acute Urinalysis is clear: No sign of infection, blood or glucose No clear change in diet or excessive acidic food, alcohol or soda. She is on both HCTZ and spironolactone which could be contributing to her symptoms although there is no clear reason why it is worse in the last month. We discussed possible overactive bladder, I offered a medication to treat this but she is hesitant.      Relevant Orders   POCT Urinalysis Dipstick (Automated) (Completed)       Eliezer Lofts, MD

## 2021-10-20 NOTE — Telephone Encounter (Signed)
Please check with patient.  I saw her most recent office visit note.  When is she going to have a follow-up with outside clinic about her cough?  What is the status with that?  Please let me know.  Thanks.

## 2021-10-20 NOTE — Assessment & Plan Note (Signed)
Acute noted in August labs done at outside MD. No clear follow-up.  She was told to decrease water intake. I will recheck her sodium today.  This could be another reason to potentially alter her HCTZ and spironolactone.

## 2021-10-20 NOTE — Patient Instructions (Addendum)
Avoid bladder irritants like citrus, tomatoes, soda, caffeine.  If symptoms are not improving.. consider talking with Dr. [name] about adjusting diuretic medications or possible a medication for over active bladder.

## 2021-10-20 NOTE — Assessment & Plan Note (Signed)
Acute Urinalysis is clear: No sign of infection, blood or glucose No clear change in diet or excessive acidic food, alcohol or soda. She is on both HCTZ and spironolactone which could be contributing to her symptoms although there is no clear reason why it is worse in the last month. We discussed possible overactive bladder, I offered a medication to treat this but she is hesitant.

## 2021-10-21 NOTE — Telephone Encounter (Signed)
Called and spoke with patient about her referral. She stated that she called Dr. Ileana Roup office yesterday and they told her his scheduler would be calling her within a week to get her scheduled with him.

## 2021-10-22 NOTE — Telephone Encounter (Signed)
Noted. Thanks.

## 2021-11-01 NOTE — Telephone Encounter (Signed)
Spoke to [facility] who told me they DON'T take Fax referrals, he transferred me to the referral line.  Referral line  # is [phone], LM for a call back so the referral can be taken care of...  ELEA

## 2021-11-02 NOTE — Telephone Encounter (Signed)
See note from EL below and please update patient.  Thanks.

## 2021-11-09 ENCOUNTER — Ambulatory Visit: Payer: Medicare Other | Attending: Nurse Practitioner

## 2021-11-09 DIAGNOSIS — E785 Hyperlipidemia, unspecified: Secondary | ICD-10-CM

## 2021-11-09 LAB — LIPID PANEL
Chol/HDL Ratio: 1.9 ratio (ref 0.0–4.4)
Cholesterol, Total: 163 mg/dL (ref 100–199)
HDL: 84 mg/dL (ref >39)
LDL Chol Calc (NIH): 65 mg/dL (ref 0–99)
Triglycerides: 71 mg/dL (ref 0–149)
VLDL Cholesterol Cal: 14 mg/dL (ref 5–40)

## 2021-11-09 LAB — HEPATIC FUNCTION PANEL
ALT: 19 IU/L (ref 0–32)
AST: 27 IU/L (ref 0–40)
Albumin: 4.9 g/dL (ref 3.9–4.9)
Alkaline Phosphatase: 81 IU/L (ref 44–121)
Bilirubin Total: 0.4 mg/dL (ref 0.0–1.2)
Bilirubin, Direct: 0.17 mg/dL (ref 0.00–0.40)
Total Protein: 6.9 g/dL (ref 6.0–8.5)

## 2021-11-12 NOTE — Telephone Encounter (Signed)
Patient seen by [facility]  09/09/2021 9:00 AM EDT Office Visit ENT/Head & Neck Surgery - [place]  [address]  [place], [place] [address]  [phone]  Bertram Millard, PA-C

## 2021-11-15 ENCOUNTER — Encounter: Payer: Self-pay | Admitting: Family Medicine

## 2021-11-15 ENCOUNTER — Ambulatory Visit (INDEPENDENT_AMBULATORY_CARE_PROVIDER_SITE_OTHER): Payer: Medicare Other | Admitting: Family Medicine

## 2021-11-15 VITALS — BP 122/80 | HR 60 | Temp 97.4°F | Ht 62.0 in | Wt 115.0 lb

## 2021-11-15 DIAGNOSIS — R052 Subacute cough: Secondary | ICD-10-CM

## 2021-11-15 MED ORDER — METOPROLOL TARTRATE 50 MG PO TABS: 25.0000 mg | ORAL_TABLET | Freq: Two times a day (BID) | ORAL | Status: DC

## 2021-11-15 MED ORDER — GABAPENTIN 100 MG PO CAPS: 100.0000 mg | ORAL_CAPSULE | Freq: Every day | ORAL | 3 refills | Status: DC | PRN

## 2021-11-15 NOTE — Progress Notes (Unsigned)
Cough.  Is still worse at night.  Taking pepcid at night, QD.  CHLORPHENIRAMINE didn't help.  No sputum.  No wheeze.  No FCNAVD.  She doesn't feel ill but is exhausted from cough.  She has tried non mint lozenges.  Using a mask at work.  No treatment so far has made a significant difference.  We talked about her referral to ENT.  According to the EMR the referral has previously been sent.  The referral department at the ENT clinic has been called.  Patient has not been able to get an appointment set up.  She is still caring for her mother.  Meds, vitals, and allergies reviewed.   ROS: Per HPI unless specifically indicated in ROS section   GEN: nad, alert and oriented HEENT: mucous membranes moist NECK: supple w/o LA CV: rrr.  no murmur PULM: ctab, no wheeze, no focal decrease in breath sounds ABD: soft, +bs EXT: no edema SKIN: well perfused.

## 2021-11-15 NOTE — Patient Instructions (Signed)
Try gabapentin for the cough and I'll check on the referral in the meantime.  Try taking pepcid twice a day.  Take care.  Glad to see you.

## 2021-11-17 ENCOUNTER — Telehealth: Payer: Self-pay | Admitting: Family Medicine

## 2021-11-17 DIAGNOSIS — R059 Cough, unspecified: Secondary | ICD-10-CM

## 2021-11-17 NOTE — Telephone Encounter (Signed)
In this situation, if she cannot be seen in the very near future by Dr. Delford Field, I think it makes a lot of sense to refer to [facility].  However, I will defer to the patient on that.  Let me know if she wants the referral done locally.    Please let me know if gabapentin is helping her cough.  Thanks.

## 2021-11-17 NOTE — Telephone Encounter (Signed)
Amy from the ENT dept @ [facility] called requesting to speak to our referral coordinator regarding the pt. Amy requested a call back @ [phone]

## 2021-11-17 NOTE — Assessment & Plan Note (Signed)
I would like to get the patient set up with Dr. Delford Field at [facility] based on input from our pulmonary department.  I have called the referral line and left a message.  I am awaiting callback.  If this continues to be fruitless endeavor, then we can refer to another ENT clinic.  In the meantime, would increase Pepcid to twice a day and add on gabapentin with routine cautions.

## 2021-11-17 NOTE — Telephone Encounter (Signed)
Spoke pt scheduled is scheduled for 02/15/22 with Dr. Delford Field and has been put on a waiting list. I told her I can try [facility] if she would like to me to, so we can see if they can get her in sooner. I have sent an email to their referral coordinator to help. Pt is aware that I will call her back once I hear back.   Karen Saunders

## 2021-11-17 NOTE — Telephone Encounter (Signed)
Jessica-please let patient know that I called Dr Lynelle Doctor ENT office twice.  I listed my phone line as the callback number to try to get this going quicker.  I did not get a call back.  Apparently they called back to the clinic here asking to talk to our referral department.  I am going to forward this note to our referral department.  If the patient does not get an appointment in the very near future then please have her let us know.  Let me know if gabapentin is helping/tolerated.  Thanks.

## 2021-11-21 NOTE — Telephone Encounter (Signed)
See below and please check with patient.  Make sure she is aware.  Let me know if gabapentin is helping her cough in the meantime.  Thanks.  ============================   Saunders, Karen Oiler  You; Wilburn Cornelia, CMA3 days ago   EL Just a heads up the appt has been moved to 12/27/21 at [facility], pt is aware of this information per office.

## 2021-11-22 NOTE — Telephone Encounter (Signed)
Called and spoke with patient, she is aware of her appointment at [facility] on 12/27/21. She states the gabapentin has helped her cough, she hasn't been coughing near as much.

## 2021-11-23 NOTE — Telephone Encounter (Signed)
Noted.  Thanks.  Glad to hear.  ? ?

## 2021-12-08 ENCOUNTER — Ambulatory Visit (INDEPENDENT_AMBULATORY_CARE_PROVIDER_SITE_OTHER): Payer: Medicare Other

## 2021-12-08 VITALS — Ht 62.0 in | Wt 115.0 lb

## 2021-12-08 DIAGNOSIS — Z0000 Encounter for general adult medical examination without abnormal findings: Secondary | ICD-10-CM | POA: Diagnosis not present

## 2021-12-08 NOTE — Patient Instructions (Addendum)
Karen Saunders , Thank you for taking time to come for your Medicare Wellness Visit. I appreciate your ongoing commitment to your health goals. Please review the following plan we discussed and let me know if I can assist you in the future.   These are the goals we discussed:  Goals       Patient Stated     I would like to gain weight (pt-stated)      Increase physical activity Eat more things like ice cream and protein        This is a list of the screening recommended for you and due dates:  Health Maintenance  Topic Date Due   COVID-19 Vaccine (4 - 2023-24 season) 12/24/2021*   Zoster (Shingles) Vaccine (1 of 2) 03/10/2022*   Mammogram  08/26/2022   Medicare Annual Wellness Visit  12/09/2022   Colon Cancer Screening  08/12/2031   Pneumonia Vaccine  Completed   Flu Shot  Completed   DEXA scan (bone density measurement)  Completed   Hepatitis C Screening: USPSTF Recommendation to screen - Ages 33-79 yo.  Completed   HPV Vaccine  Aged Out  *Topic was postponed. The date shown is not the original due date.    Advanced directives: End of life planning; Advance aging; Advanced directives discussed.  Copy of current HCPOA/Living Will requested.    Conditions/risks identified: none new.  Next appointment: Follow up in one year for your annual wellness visit    Preventive Care 65 Years and Older, Female Preventive care refers to lifestyle choices and visits with your health care provider that can promote health and wellness. What does preventive care include? A yearly physical exam. This is also called an annual well check. Dental exams once or twice a year. Routine eye exams. Ask your health care provider how often you should have your eyes checked. Personal lifestyle choices, including: Daily care of your teeth and gums. Regular physical activity. Eating a healthy diet. Avoiding tobacco and drug use. Limiting alcohol use. Practicing safe sex. Taking low-dose aspirin every  day. Taking vitamin and mineral supplements as recommended by your health care provider. What happens during an annual well check? The services and screenings done by your health care provider during your annual well check will depend on your age, overall health, lifestyle risk factors, and family history of disease. Counseling  Your health care provider may ask you questions about your: Alcohol use. Tobacco use. Drug use. Emotional well-being. Home and relationship well-being. Sexual activity. Eating habits. History of falls. Memory and ability to understand (cognition). Work and work Astronomer. Reproductive health. Screening  You may have the following tests or measurements: Height, weight, and BMI. Blood pressure. Lipid and cholesterol levels. These may be checked every 5 years, or more frequently if you are over 48 years old. Skin check. Lung cancer screening. You may have this screening every year starting at age 19 if you have a 30-pack-year history of smoking and currently smoke or have quit within the past 15 years. Fecal occult blood test (FOBT) of the stool. You may have this test every year starting at age 25. Flexible sigmoidoscopy or colonoscopy. You may have a sigmoidoscopy every 5 years or a colonoscopy every 10 years starting at age 14. Hepatitis C blood test. Hepatitis B blood test. Sexually transmitted disease (STD) testing. Diabetes screening. This is done by checking your blood sugar (glucose) after you have not eaten for a while (fasting). You may have this done every 1-3 years.  Bone density scan. This is done to screen for osteoporosis. You may have this done starting at age 68. Mammogram. This may be done every 1-2 years. Talk to your health care provider about how often you should have regular mammograms. Talk with your health care provider about your test results, treatment options, and if necessary, the need for more tests. Vaccines  Your health care  provider may recommend certain vaccines, such as: Influenza vaccine. This is recommended every year. Tetanus, diphtheria, and acellular pertussis (Tdap, Td) vaccine. You may need a Td booster every 10 years. Zoster vaccine. You may need this after age 42. Pneumococcal 13-valent conjugate (PCV13) vaccine. One dose is recommended after age 22. Pneumococcal polysaccharide (PPSV23) vaccine. One dose is recommended after age 30. Talk to your health care provider about which screenings and vaccines you need and how often you need them. This information is not intended to replace advice given to you by your health care provider. Make sure you discuss any questions you have with your health care provider. Document Released: 01/23/2015 Document Revised: 09/16/2015 Document Reviewed: 10/28/2014 Elsevier Interactive Patient Education  2017 [place] Prevention in the Home Falls can cause injuries. They can happen to people of all ages. There are many things you can do to make your home safe and to help prevent falls. What can I do on the outside of my home? Regularly fix the edges of walkways and driveways and fix any cracks. Remove anything that might make you trip as you walk through a door, such as a raised step or threshold. Trim any bushes or trees on the path to your home. Use bright outdoor lighting. Clear any walking paths of anything that might make someone trip, such as rocks or tools. Regularly check to see if handrails are loose or broken. Make sure that both sides of any steps have handrails. Any raised decks and porches should have guardrails on the edges. Have any leaves, snow, or ice cleared regularly. Use sand or salt on walking paths during winter. Clean up any spills in your garage right away. This includes oil or grease spills. What can I do in the bathroom? Use night lights. Install grab bars by the toilet and in the tub and shower. Do not use towel bars as grab  bars. Use non-skid mats or decals in the tub or shower. If you need to sit down in the shower, use a plastic, non-slip stool. Keep the floor dry. Clean up any water that spills on the floor as soon as it happens. Remove soap buildup in the tub or shower regularly. Attach bath mats securely with double-sided non-slip rug tape. Do not have throw rugs and other things on the floor that can make you trip. What can I do in the bedroom? Use night lights. Make sure that you have a light by your bed that is easy to reach. Do not use any sheets or blankets that are too big for your bed. They should not hang down onto the floor. Have a firm chair that has side arms. You can use this for support while you get dressed. Do not have throw rugs and other things on the floor that can make you trip. What can I do in the kitchen? Clean up any spills right away. Avoid walking on wet floors. Keep items that you use a lot in easy-to-reach places. If you need to reach something above you, use a strong step stool that has a grab bar.  Keep electrical cords out of the way. Do not use floor polish or wax that makes floors slippery. If you must use wax, use non-skid floor wax. Do not have throw rugs and other things on the floor that can make you trip. What can I do with my stairs? Do not leave any items on the stairs. Make sure that there are handrails on both sides of the stairs and use them. Fix handrails that are broken or loose. Make sure that handrails are as long as the stairways. Check any carpeting to make sure that it is firmly attached to the stairs. Fix any carpet that is loose or worn. Avoid having throw rugs at the top or bottom of the stairs. If you do have throw rugs, attach them to the floor with carpet tape. Make sure that you have a light switch at the top of the stairs and the bottom of the stairs. If you do not have them, ask someone to add them for you. What else can I do to help prevent  falls? Wear shoes that: Do not have high heels. Have rubber bottoms. Are comfortable and fit you well. Are closed at the toe. Do not wear sandals. If you use a stepladder: Make sure that it is fully opened. Do not climb a closed stepladder. Make sure that both sides of the stepladder are locked into place. Ask someone to hold it for you, if possible. Clearly mark and make sure that you can see: Any grab bars or handrails. First and last steps. Where the edge of each step is. Use tools that help you move around (mobility aids) if they are needed. These include: Canes. Walkers. Scooters. Crutches. Turn on the lights when you go into a dark area. Replace any light bulbs as soon as they burn out. Set up your furniture so you have a clear path. Avoid moving your furniture around. If any of your floors are uneven, fix them. If there are any pets around you, be aware of where they are. Review your medicines with your doctor. Some medicines can make you feel dizzy. This can increase your chance of falling. Ask your doctor what other things that you can do to help prevent falls. This information is not intended to replace advice given to you by your health care provider. Make sure you discuss any questions you have with your health care provider. Document Released: 10/23/2008 Document Revised: 06/04/2015 Document Reviewed: 01/31/2014 Elsevier Interactive Patient Education  2017 [employer].

## 2021-12-08 NOTE — Progress Notes (Signed)
Subjective:   Karen Saunders is a 66 y.o. female who presents for Medicare Annual (Subsequent) preventive examination.  Review of Systems    No ROS.  Medicare Wellness Virtual Visit.  Visual/audio telehealth visit, UTA vital signs.   See social history for additional risk factors.   Cardiac Risk Factors include: advanced age (>6655men, 36>65 women);hypertension     Objective:    Today's Vitals   12/08/21 0902  Weight: 115 lb (52.2 kg)  Height: 5\' 2"  (1.575 m)   Body mass index is 21.03 kg/m.     12/08/2021    9:09 AM 05/12/2016    1:34 PM 02/05/2015   11:44 AM 02/07/2014    3:30 AM 02/06/2014   10:15 PM  Advanced Directives  Does Patient Have a Medical Advance Directive? Yes No No Yes No  Type of Advance Directive Healthcare Power of [employer]   Healthcare Power of Attorney   Does patient want to make changes to medical advance directive? No - Patient declined   No - Patient declined   Copy of Healthcare Power of Attorney in Chart? No - copy requested   No - copy requested   Would patient like information on creating a medical advance directive?  No - Patient declined       Current Medications (verified) Outpatient Encounter Medications as of 12/08/2021  Medication Sig   amLODipine (NORVASC) 5 MG tablet Take 2 tablets (10 mg total) by mouth daily.   aspirin 81 MG tablet Take 81 mg by mouth daily.   carbamazepine (TEGRETOL) 200 MG tablet TAKE 2 TABLETS IN THE MORNING AND 2 TABLETS AT NIGHT   dexlansoprazole (DEXILANT) 60 MG capsule Take 60 mg by mouth daily.   ezetimibe (ZETIA) 10 MG tablet TAKE 1 TABLET EVERY DAY   famotidine (PEPCID) 20 MG tablet Take 1 tablet (20 mg total) by mouth 2 (two) times daily.   gabapentin (NEURONTIN) 100 MG capsule Take 1 capsule (100 mg total) by mouth daily as needed (for cough).   hydrochlorothiazide (HYDRODIURIL) 25 MG tablet Take 1 tablet (25 mg total) by mouth daily.   metoprolol tartrate (LOPRESSOR) 50 MG tablet Take 0.5 tablets (25 mg  total) by mouth 2 (two) times daily.   rosuvastatin (CRESTOR) 40 MG tablet Take 1 tablet (40 mg total) by mouth daily.   spironolactone (ALDACTONE) 25 MG tablet TAKE 1 TABLET BY MOUTH EVERY DAY   traZODone (DESYREL) 50 MG tablet Take 0.5-1 tablets (25-50 mg total) by mouth at bedtime as needed for sleep.   No facility-administered encounter medications on file as of 12/08/2021.    Allergies (verified) Amoxicillin-pot clavulanate, Azithromycin, Delsym [dextromethorphan], Doxycycline, Ibandronate sodium, Iohexol, Raloxifene, Sulfonamide derivatives, Zoledronic acid, and Iodinated contrast media   History: Past Medical History:  Diagnosis Date   Anemia    with prev unremarkable heme eval ~2011   CAD (coronary artery disease)    Dr. Donnie Ahoilley with Cards   History of gastric ulcer    2005    Hyperlipidemia    Hypertension    IBS (irritable bowel syndrome)    Leukopenia    with prev unremarkable heme eval ~2011   Migraine    Unsp w/o intract w/o status migrainosus   Osteoporosis 11/18/2004   Shingles    Trigeminal neuralgia 03/05/2012   Past Surgical History:  Procedure Laterality Date   ABDOMINAL HYSTERECTOMY  2002   CORONARY ARTERY BYPASS GRAFT  2002   LUMBAR SPINE SURGERY     Stress cardiolite  09/11/2008  Probably normal with breast attenuation noted but no evidence of ischemia   TONSILLECTOMY  ~ 1965   Family History  Problem Relation Age of Onset   Arthritis Mother    Diabetes Mother    Hypertension Mother    Dementia Mother    Stroke Father    Heart disease Father        MI   Diabetes Father    Hypertension Father    Colon cancer Neg Hx    Breast cancer Neg Hx    Social History   Socioeconomic History   Marital status: Divorced    Spouse name: Not on file   Number of children: 1   Years of education: Not on file   Highest education level: Not on file  Occupational History   Occupation: MEDICAID SPEC    Employer: [employer] worker   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     Works at [employer]   Social Determinants of Health   Financial Resource Strain: Low Risk  (12/08/2021)   Overall Financial Resource Strain (CARDIA)    Difficulty of Paying Living Expenses: Not hard at all  Food Insecurity: No Food Insecurity (12/08/2021)   Hunger Vital Sign    Worried About Running Out of Food in the Last Year: Never true    Ran Out of Food in the Last Year: Never true  Transportation Needs: No Transportation Needs (12/08/2021)   PRAPARE - Administrator, Civil Service (Medical): No    Lack of Transportation (Non-Medical): No  Physical Activity: Not on file  Stress: No Stress Concern Present (12/08/2021)   [employer] of Occupational Health - Occupational Stress Questionnaire    Feeling of Stress : Not at all  Social Connections: Unknown (12/08/2021)   Social Connection and Isolation Panel [NHANES]    Frequency of Communication with Friends and Family: More than three times a week    Frequency of Social Gatherings with Friends and Family: More than three times a week    Attends Religious Services: More than 4 times per year    Active Member of [employer] or Organizations: Not on file    Attends Engineer, structural: More than 4 times per year    Marital Status: Not on file    Tobacco Counseling Counseling given: Not Answered   Clinical Intake:  Pre-visit preparation completed: Yes        Diabetes: No  How often  do you need to have someone help you when you read instructions, pamphlets, or other written materials from your doctor or pharmacy?: 1 - Never   Interpreter Needed?: No      Activities of Daily Living    12/08/2021    9:10 AM  In your present state of health, do you have any difficulty performing the following activities:  Hearing? 0  Vision? 0  Difficulty concentrating or making decisions? 0  Walking or climbing stairs? 0  Dressing or bathing? 0  Doing errands, shopping? 0  Preparing Food and eating ? N  Using the Toilet? N  In the past six months, have you accidently leaked urine? N  Do you have problems with loss of bowel control? N  Managing your Medications? N  Managing your Finances? N  Housekeeping or managing your Housekeeping? N    Patient Care Team: Joaquim Nam, MD as PCP - General Katrinka Blazing Barry Dienes, MD as PCP - Cardiology (Cardiology) Charna Elizabeth, MD as Consulting Physician (Gastroenterology) Othella Boyer, MD as Consulting Physician (Cardiology)  Indicate any recent Medical Services you may have received from other than [facility] providers in the past year (date may be approximate).     Assessment:   This is a routine wellness examination for Smantha.  I connected with  Karen Gosling on 12/08/21 by a audio enabled telemedicine application and verified that I am speaking with the correct person using two identifiers.  Patient Location: Home  Provider Location: Office/Clinic  I discussed the limitations of evaluation and management by telemedicine. The patient expressed understanding and agreed to proceed.   Hearing/Vision screen Hearing Screening - Comments:: Patient is able to hear conversational tones without difficulty.  No issues reported.   Vision Screening - Comments:: Followed by Dr. Alden Hipp, [facility] Wears reader lenses Cataract extraction, bilateral They have seen their ophthalmologist in the last 12 months.    Dietary issues and  exercise activities discussed: Current Exercise Habits: Home exercise routine, Type of exercise: walking, Intensity: Mild Regular diet Dairy free diet Good water intake   Goals Addressed               This Visit's Progress     Patient Stated     I would like to gain weight (pt-stated)        Increase physical activity Eat more things like ice cream and protein       Depression Screen    12/08/2021    9:08 AM 12/07/2020   10:31 AM 12/14/2017   12:48 PM 11/04/2016    9:44 AM 03/01/2015    8:43 AM 02/08/2015    8:31 AM 07/31/2014    9:18 AM  PHQ 2/9 Scores  PHQ - 2 Score 0 0 0 0 0 0 0    Fall Risk    12/08/2021    9:10 AM 12/07/2020   10:31 AM  Fall Risk   Falls in the past year? 0 0  Number falls in past yr: 0 0  Injury with Fall? 0 0  Risk for fall due to : No Fall Risks No Fall Risks  Follow up Falls evaluation completed Falls evaluation completed    FALL RISK PREVENTION PERTAINING TO THE HOME: Home free of loose throw rugs in walkways, pet beds, electrical cords, etc? Yes  Adequate lighting in your home to reduce risk of falls? Yes   ASSISTIVE DEVICES UTILIZED TO PREVENT FALLS: Life alert? No  Use of a cane, walker or w/c? No  Grab bars in the bathroom? No  Shower chair or bench in shower? No  Elevated toilet seat or a handicapped toilet? No   TIMED UP AND GO: Was the test performed? No .   Cognitive Function:        12/08/2021    9:10 AM  6CIT Screen  What Year? 0 points  What month? 0 points  What time?  0 points  Count back from 20 0 points  Months in reverse 0 points  Repeat phrase 0 points  Total Score 0 points    Immunizations Immunization History  Administered Date(s) Administered   Fluad Quad(high Dose 65+) 10/20/2021   Influenza Whole 10/10/2009, 10/11/2011, 10/17/2012   Influenza,inj,Quad PF,6+ Mos 10/26/2016, 11/09/2019, 11/04/2020   Influenza,inj,Quad PF,6-35 Mos 11/10/2015, 10/24/2017, 10/17/2018   Influenza,inj,quad, With  Preservative 11/08/2013, 11/05/2014   Influenza-Unspecified 11/08/2013, 10/29/2014, 11/05/2015, 10/10/2017   PFIZER(Purple Top)SARS-COV-2 Vaccination 02/14/2019, 03/08/2019, 01/28/2020   PNEUMOCOCCAL CONJUGATE-20 12/07/2020   Pneumococcal Polysaccharide-23 11/09/2010   Td 01/11/2008   Covid-19 vaccine status: Completed vaccines x3.   Shingrix Completed?: No.    Education has been provided regarding the importance of this vaccine. Patient has been advised to call insurance company to determine out of pocket expense if they have not yet received this vaccine. Advised may also receive vaccine at local pharmacy or Health Dept. Verbalized acceptance and understanding.  Screening Tests Health Maintenance  Topic Date Due   COVID-19 Vaccine (4 - 2023-24 season) 12/24/2021 (Originally 09/10/2021)   Zoster Vaccines- Shingrix (1 of 2) 03/10/2022 (Originally 10/18/1974)   MAMMOGRAM  08/26/2022   Medicare Annual Wellness (AWV)  12/09/2022   COLONOSCOPY (Pts 45-32yrs Insurance coverage will need to be confirmed)  08/12/2031   Pneumonia Vaccine 70+ Years old  Completed   INFLUENZA VACCINE  Completed   DEXA SCAN  Completed   Hepatitis C Screening  Completed   HPV VACCINES  Aged Out   Health Maintenance There are no preventive care reminders to display for this patient.  Lung Cancer Screening: (Low Dose CT Chest recommended if Age 50-80 years, 30 pack-year currently smoking OR have quit w/in 15years.) does not qualify.   Hepatitis C Screening: Completed 2019.  Vision Screening: Recommended annual ophthalmology exams for early detection of glaucoma and other disorders of the eye.  Dental Screening: Recommended annual dental exams for proper oral hygiene.  Dentures.  Community Resource Referral / Chronic Care Management: CRR required this visit?  No   CCM required this visit?  No      Plan:     I have personally reviewed and noted the following in the patient's chart:   Medical and social  history Use of alcohol, tobacco or illicit drugs  Current medications and supplements including opioid prescriptions. Patient is not currently taking opioid prescriptions. Functional ability and status Nutritional status Physical activity Advanced directives List of other physicians Hospitalizations, surgeries, and ER visits in previous 12 months Vitals Screenings to include cognitive, depression, and falls Referrals and appointments  In addition, I have reviewed and discussed with patient certain preventive protocols, quality metrics, and best practice recommendations. A written personalized care plan for preventive services as well as general preventive health recommendations were provided to patient.     Cathey Endow, LPN   [id number]

## 2021-12-09 ENCOUNTER — Other Ambulatory Visit: Payer: Self-pay | Admitting: Family Medicine

## 2021-12-28 ENCOUNTER — Telehealth (INDEPENDENT_AMBULATORY_CARE_PROVIDER_SITE_OTHER): Payer: Medicare Other | Admitting: Family Medicine

## 2021-12-28 VITALS — Wt 115.0 lb

## 2021-12-28 DIAGNOSIS — U071 COVID-19: Secondary | ICD-10-CM | POA: Diagnosis not present

## 2021-12-28 MED ORDER — MOLNUPIRAVIR EUA 200MG CAPSULE: 4.0000 | ORAL_CAPSULE | Freq: Two times a day (BID) | ORAL | 0 refills | Status: AC

## 2021-12-28 MED ORDER — BENZONATATE 100 MG PO CAPS: ORAL_CAPSULE | ORAL | 0 refills | Status: DC

## 2021-12-28 NOTE — Patient Instructions (Addendum)
HOME CARE TIPS:   -I sent the medication(s) we discussed to your pharmacy: Meds ordered this encounter  Medications   molnupiravir EUA (LAGEVRIO) 200 mg CAPS capsule    Sig: Take 4 capsules (800 mg total) by mouth 2 (two) times daily for 5 days.    Dispense:  40 capsule    Refill:  0   benzonatate (TESSALON PERLES) 100 MG capsule    Sig: 1-2 capsules up to twice daily as needed for cough.    Dispense:  30 capsule    Refill:  0     -I sent in the [place] treatment or referral you requested per our discussion. Please see the information provided below and discuss further with the pharmacist/treatment team.   -there is a chance of rebound illness with covid after improving. This can happen whether or not you take an antiviral treatment. If you become sick again with covid after getting better, please schedule a follow up virtual visit and isolate again.  -can use tylenol  if needed for fevers, aches and pains per instructions  -nasal saline sinus rinses twice daily  -stay hydrated, drink plenty of fluids and eat small healthy meals - avoid dairy  -follow up with your doctor in 2-3 days unless improving and feeling better  -stay home while sick, except to seek medical care. If you have COVID19, you will likely be contagious for 7-10 days. Flu or Influenza is likely contagious for about 7 days. Other respiratory viral infections remain contagious for 5-10+ days depending on the virus and many other factors. Wear a good mask that fits snugly (such as N95 or KN95) if around others to reduce the risk of transmission.  It was nice to meet you today, and I really hope you are feeling better soon. I help [facility] out with telemedicine visits on Tuesdays and Thursdays and am happy to help if you need a follow up virtual visit on those days. Otherwise, if you have any concerns or questions following this visit please schedule a follow up visit with your Primary Care doctor or seek care at a  local urgent care clinic to avoid delays in care.    Seek in person care or schedule a follow up video visit promptly if your symptoms worsen, new concerns arise or you are not improving with treatment. Call 911 and/or seek emergency care if your symptoms are severe or life threatening.  PLEASE SEE THE FOLLOWING LINK FOR THE MOST UPDATED INFORMATION ABOUT LAGEVRIO:  [email or web address]      Fact Sheet for Patients And Caregivers Emergency Use Authorization (EUA) Of LAGEVRIOT (molnupiravir) capsules For Coronavirus Disease 2019 (COVID-19)  What is the most important information I should know about LAGEVRIO? LAGEVRIO may cause serious side effects, including: ? LAGEVRIO may cause harm to your unborn baby. It is not known if LAGEVRIO will harm your baby if you take LAGEVRIO during pregnancy. o LAGEVRIO is not recommended for use in pregnancy. o LAGEVRIO has not been studied in pregnancy. LAGEVRIO was studied in pregnant animals only. When LAGEVRIO was given to pregnant animals, LAGEVRIO caused harm to their unborn babies. o You and your healthcare provider may decide that you should take LAGEVRIO during pregnancy if there are no other COVID-19 treatment options approved or authorized by the FDA that are accessible or clinically appropriate for you. o If you and your healthcare provider decide that you should take LAGEVRIO during pregnancy, you and your healthcare provider should discuss the known and potential benefits and  the potential risks of taking LAGEVRIO during pregnancy. For individuals who are able to become pregnant: ? You should use a reliable method of birth control (contraception) consistently and correctly during treatment with LAGEVRIO and for 4 days after the last dose of LAGEVRIO. Talk to your healthcare provider about reliable birth control methods. ? Before starting treatment with [facility] your healthcare provider may do a pregnancy test to see if you are  pregnant before starting treatment with LAGEVRIO. ? Tell your healthcare provider right away if you become pregnant or think you may be pregnant during treatment with LAGEVRIO. Pregnancy Surveillance Program: ? There is a pregnancy surveillance program for individuals who take LAGEVRIO during pregnancy. The purpose of this program is to collect information about the health of you and your baby. Talk to your healthcare provider about how to take part in this program. ? If you take LAGEVRIO during pregnancy and you agree to participate in the pregnancy surveillance program and allow your healthcare provider to share your information with [place], then your healthcare provider will report your use of [place] during pregnancy to [place]. by calling [phone] or [email or web address]. For individuals who are sexually active with partners who are able to become pregnant: ? It is not known if LAGEVRIO can affect sperm. While the risk is regarded as low, animal studies to fully assess the potential for LAGEVRIO to affect the babies of males treated with LAGEVRIO have not been completed. A reliable method of birth control (contraception) should be used consistently and correctly during treatment with LAGEVRIO and for at least 3 months after the last dose. The risk to sperm beyond 3 months is not known. Studies to understand the risk to sperm beyond 3 months are ongoing. Talk to your healthcare provider about reliable birth control methods. Talk to your healthcare provider if you have questions or concerns about how LAGEVRIO may affect sperm. You are being given this fact sheet because your healthcare provider believes it is necessary to provide you with LAGEVRIO for the treatment of adults with mild-to-moderate coronavirus disease 2019 (COVID-19) with positive results of direct SARS-CoV-2 viral testing, and who are at high risk for progression to severe COVID-19  including hospitalization or death, and for whom other COVID-19 treatment options approved or authorized by the FDA are not accessible or clinically appropriate. The U.S. Food and Drug Administration (FDA) has issued an Emergency Use Authorization (EUA) to make LAGEVRIO available during the COVID-19 pandemic (for more details about an EUA please see "What is an Emergency Use Authorization?" at the end of this document). LAGEVRIO is not an FDA-approved medicine in the [place]. Read this Fact Sheet for information about LAGEVRIO. Talk to your healthcare provider about your options if you have any questions. It is your choice to take LAGEVRIO.  What is COVID-19? COVID-19 is caused by a virus called a coronavirus. You can get COVID-19 through close contact with another person who has the virus. COVID-19 illnesses have ranged from very mild-to-severe, including illness resulting in death. While information so far suggests that most COVID-19 illness is mild, serious illness can happen and may cause some of your other medical conditions to become worse. Older people and people of all ages with severe, long lasting (chronic) medical conditions like heart disease, lung disease and diabetes, for example seem to be at higher risk of being hospitalized for COVID-19.  What is LAGEVRIO? LAGEVRIO is an investigational medicine used to treat mild-to-moderate COVID-19 in  adults: ? with positive results of direct SARS-CoV-2 viral testing, and ? who are at high risk for progression to severe COVID-19 including hospitalization or death, and for whom other COVID-19 treatment options approved or authorized by the FDA are not accessible or clinically appropriate. The FDA has authorized the emergency use of LAGEVRIO for the treatment of mild-tomoderate COVID-19 in adults under an EUA. For more information on EUA, see the "What is an Emergency Use Authorization (EUA)?" section at the end of this Fact  Sheet. LAGEVRIO is not authorized: ? for use in people less than 74 years of age. ? for prevention of COVID-19. ? for people needing hospitalization for COVID-19. ? for use for longer than 5 consecutive days.  What should I tell my healthcare provider before I take LAGEVRIO? Tell your healthcare provider if you: ? Have any allergies ? Are breastfeeding or plan to breastfeed ? Have any serious illnesses ? Are taking any medicines (prescription, over-the-counter, vitamins, or herbal products).  How do I take LAGEVRIO? ? Take LAGEVRIO exactly as your healthcare provider tells you to take it. ? Take 4 capsules of LAGEVRIO every 12 hours (for example, at 8 am and at 8 pm) ? Take LAGEVRIO for 5 days. It is important that you complete the full 5 days of treatment with LAGEVRIO. Do not stop taking LAGEVRIO before you complete the full 5 days of treatment, even if you feel better. ? Take LAGEVRIO with or without food. ? You should stay in isolation for as long as your healthcare provider tells you to. Talk to your healthcare provider if you are not sure about how to properly isolate while you have COVID-19. ? Swallow LAGEVRIO capsules whole. Do not open, break, or crush the capsules. If you cannot swallow capsules whole, tell your healthcare provider. ? What to do if you miss a dose: o If it has been less than 10 hours since the missed dose, take it as soon as you remember o If it has been more than 10 hours since the missed dose, skip the missed dose and take your dose at the next scheduled time. ? Do not double the dose of LAGEVRIO to make up for a missed dose.  What are the important possible side effects of LAGEVRIO? ? See, "What is the most important information I should know about LAGEVRIO?" ? Allergic Reactions. Allergic reactions can happen in people taking LAGEVRIO, even after only 1 dose. Stop taking LAGEVRIO and call your healthcare provider right away if you get any of the  following symptoms of an allergic reaction: o hives o rapid heartbeat o trouble swallowing or breathing o swelling of the mouth, lips, or face o throat tightness o hoarseness o skin rash The most common side effects of LAGEVRIO are: ? diarrhea ? nausea ? dizziness These are not all the possible side effects of LAGEVRIO. Not many people have taken LAGEVRIO. Serious and unexpected side effects may happen. This medicine is still being studied, so it is possible that all of the risks are not known at this time.  What other treatment choices are there?  Veklury (remdesivir) is FDA-approved as an intravenous (IV) infusion for the treatment of mildto-moderate YSAYT-01 in certain adults and children. Talk with your doctor to see if Marijean Heath is appropriate for you. Like LAGEVRIO, FDA may also allow for the emergency use of other medicines to treat people with COVID-19. Go to LacrosseProperties.si for more information. It is your choice to be treated or not to  be treated with LAGEVRIO. Should you decide not to take it, it will not change your standard medical care.  What if I am breastfeeding? Breastfeeding is not recommended during treatment with LAGEVRIO and for 4 days after the last dose of LAGEVRIO. If you are breastfeeding or plan to breastfeed, talk to your healthcare provider about your options and specific situation before taking LAGEVRIO.  How do I report side effects with LAGEVRIO? Contact your healthcare provider if you have any side effects that bother you or do not go away. Report side effects to FDA MedWatch at SmoothHits.hu or call 1-800-FDA-1088 (1- 534-675-2976).  How should I store [place]? ? Store LAGEVRIO capsules at room temperature between 60F to 19F (20C to 25C). ? Keep LAGEVRIO and all medicines out of the reach of children and pets. How can I learn more  about COVID-19? ? Ask your healthcare provider. ? Visit SeekRooms.co.uk ? Contact your local or state public health department. ? Call [place] at [phone] (toll free in the [place] ? Visit [email or web address].com  What Is an Emergency Use Authorization (EUA)? The [place] FDA has made [place] available under an emergency access mechanism called an Emergency Use Authorization (EUA) The EUA is supported by a Presenter, broadcasting Health and Human Service (HHS) declaration that circumstances exist to justify emergency use of drugs and biological products during the COVID-19 pandemic. LAGEVRIO for the treatment of mild-to-moderate COVID-19 in adults with positive results of direct SARS-CoV-2 viral testing, who are at high risk for progression to severe COVID-19, including hospitalization or death, and for whom alternative COVID-19 treatment options approved or authorized by FDA are not accessible or clinically appropriate, has not undergone the same type of review as an FDA-approved product. In issuing an EUA under the MOLMB-86 public health emergency, the FDA has determined, among other things, that based on the total amount of scientific evidence available including data from adequate and well-controlled clinical trials, if available, it is reasonable to believe that the product may be effective for diagnosing, treating, or preventing COVID-19, or a serious or life-threatening disease or condition caused by COVID-19; that the known and potential benefits of the product, when used to diagnose, treat, or prevent such disease or condition, outweigh the known and potential risks of such product; and that there are no adequate, approved, and available alternatives.  All of these criteria must be met to allow for the product to be used in the treatment of patients during the COVID-19 pandemic. The EUA for LAGEVRIO is in effect for the duration of the COVID-19 declaration justifying  emergency use of LAGEVRIO, unless terminated or revoked (after which LAGEVRIO may no longer be used under the EUA). For patent information: [email or web address].[email or web address] Copyright  2021-2022 [place] [place], [place] and its affiliates. All rights reserved. [id number] Revised: March 2022

## 2021-12-28 NOTE — Progress Notes (Signed)
Virtual Visit via Video Note  I connected with   on 12/28/21 at  3:40 PM EST by a video enabled telemedicine application and verified that I am speaking with the correct person using two identifiers.  Location patient: [place] Location provider:work or home office Persons participating in the virtual visit: patient, provider  I discussed the limitations and requested verbal permission for telemedicine visit. The patient expressed understanding and agreed to proceed.   HPI:  Acute telemedicine visit for Covid: -Onset:3 days ago, home test positive yesterday -Symptoms include: congestion, cough, sore throat -Denies:fever, CP, SOB, NVD, inability to tol oral intake -interested in antiviral, but she does not want to take Paxlovid as heard bad things about it -Has tried: musinex -Pertinent past medical history: see below, had covid last year -Pertinent medication allergies: Allergies  Allergen Reactions   Amoxicillin-Pot Clavulanate     REACTION: Rash on tongue   Azithromycin Other (See Comments)    diarrhea   Delsym [Dextromethorphan]     GI upset.   Doxycycline     GI intolerance   Ibandronate Sodium     REACTION: GI side effects   Iohexol      Desc: HIVES,NASAL CONGESTION DURING A CARDIAC CATH. REQUIRES PRE-MEDS.    Raloxifene     REACTION: GI upset   Sulfonamide Derivatives     REACTION: itching   Zoledronic Acid     REACTION: unable to take this as she was intolerant of pre-tx calcium and vitamin D   Iodinated Contrast Media Rash   -COVID-19 vaccine status: has not had the most recent booster Immunization History  Administered Date(s) Administered   Fluad Quad(high Dose 65+) 10/20/2021   Influenza Whole 10/10/2009, 10/11/2011, 10/17/2012   Influenza,inj,Quad PF,6+ Mos 10/26/2016, 11/09/2019, 11/04/2020   Influenza,inj,Quad PF,6-35 Mos 11/10/2015, 10/24/2017, 10/17/2018   Influenza,inj,quad, With Preservative 11/08/2013, 11/05/2014   Influenza-Unspecified 11/08/2013,  10/29/2014, 11/05/2015, 10/10/2017   PFIZER(Purple Top)SARS-COV-2 Vaccination 02/14/2019, 03/08/2019, 01/28/2020   PNEUMOCOCCAL CONJUGATE-20 12/07/2020   Pneumococcal Polysaccharide-23 11/09/2010   Td 01/11/2008     ROS: See pertinent positives and negatives per HPI.  Past Medical History:  Diagnosis Date   Anemia    with prev unremarkable heme eval ~2011   CAD (coronary artery disease)    Dr. Donnie Aho with Cards   History of gastric ulcer    2005    Hyperlipidemia    Hypertension    IBS (irritable bowel syndrome)    Leukopenia    with prev unremarkable heme eval ~2011   Migraine    Unsp w/o intract w/o status migrainosus   Osteoporosis 11/18/2004   Shingles    Trigeminal neuralgia 03/05/2012    Past Surgical History:  Procedure Laterality Date   ABDOMINAL HYSTERECTOMY  2002   CORONARY ARTERY BYPASS GRAFT  2002   LUMBAR SPINE SURGERY     Stress cardiolite  09/11/2008   Probably normal with breast attenuation noted but no evidence of ischemia   TONSILLECTOMY  ~ 1965     Current Outpatient Medications:    amLODipine (NORVASC) 5 MG tablet, Take 2 tablets (10 mg total) by mouth daily., Disp: 30 tablet, Rfl: 11   aspirin 81 MG tablet, Take 81 mg by mouth daily., Disp: , Rfl:    benzonatate (TESSALON PERLES) 100 MG capsule, 1-2 capsules up to twice daily as needed for cough., Disp: 30 capsule, Rfl: 0   carbamazepine (TEGRETOL) 200 MG tablet, TAKE 2 TABLETS IN THE MORNING AND 2 TABLETS AT NIGHT, Disp: , Rfl:  dexlansoprazole (DEXILANT) 60 MG capsule, Take 60 mg by mouth daily., Disp: , Rfl:    ezetimibe (ZETIA) 10 MG tablet, TAKE 1 TABLET EVERY DAY, Disp: 90 tablet, Rfl: 3   famotidine (PEPCID) 20 MG tablet, Take 1 tablet (20 mg total) by mouth 2 (two) times daily., Disp: , Rfl:    gabapentin (NEURONTIN) 100 MG capsule, Take 1 capsule (100 mg total) by mouth daily as needed (for cough)., Disp: 90 capsule, Rfl: 3   hydrochlorothiazide (HYDRODIURIL) 25 MG tablet, Take 1 tablet  (25 mg total) by mouth daily., Disp: 90 tablet, Rfl: 3   metoprolol tartrate (LOPRESSOR) 50 MG tablet, Take 0.5 tablets (25 mg total) by mouth 2 (two) times daily., Disp: , Rfl:    molnupiravir EUA (LAGEVRIO) 200 mg CAPS capsule, Take 4 capsules (800 mg total) by mouth 2 (two) times daily for 5 days., Disp: 40 capsule, Rfl: 0   rosuvastatin (CRESTOR) 40 MG tablet, Take 1 tablet (40 mg total) by mouth daily., Disp: 30 tablet, Rfl: 11   spironolactone (ALDACTONE) 25 MG tablet, TAKE 1 TABLET BY MOUTH EVERY DAY, Disp: 90 tablet, Rfl: 3   traZODone (DESYREL) 50 MG tablet, Take 0.5-1 tablets (25-50 mg total) by mouth at bedtime as needed for sleep., Disp: 30 tablet, Rfl: 3  EXAM:  VITALS per patient if applicable:  GENERAL: alert, oriented, appears well and in no acute distress  HEENT: atraumatic, conjunttiva clear, no obvious abnormalities on inspection of external nose and ears  NECK: normal movements of the head and neck  LUNGS: on inspection no signs of respiratory distress, breathing rate appears normal, no obvious gross SOB, gasping or wheezing  CV: no obvious cyanosis  MS: moves all visible extremities without noticeable abnormality  PSYCH/NEURO: pleasant and cooperative, no obvious depression or anxiety, speech and thought processing grossly intact  ASSESSMENT AND PLAN:  Discussed the following assessment and plan:  COVID-19  -we discussed possible serious and likely etiologies, options for evaluation and workup, limitations of telemedicine visit vs in  Discussed treatment options, side effect and risk of drug interactions, ideal treatment window, potential complications, isolation and precautions for COVID-19.  After lengthy discussion, the patient opted for treatment with Legevrio due to being higher risk for complications of covid or severe disease and other factors. Discussed EUA status of this drug and the fact that there is preliminary limited knowledge of  risks/interactions/side effects per EUA document vs possible benefits and precautions. This information was shared with patient during the visit and also was provided in patient instructions. Also, advised that patient discuss risks/interactions and use with pharmacist/treatment team as well.  The patient did want a prescription for cough, Tessalon Rx sent.  Other symptomatic care measures summarized in patient instructions. Work/School slipped offered:  declined Advised to seek prompt virtual visit or in person care if worsening, new symptoms arise, or if is not improving with treatment as expected per our conversation of expected course. Discussed options for follow up care. Did let this patient know that I do telemedicine on Tuesdays and Thursdays for [facility] and those are the days I am logged into the system. Advised to schedule follow up visit with PCP, [place] virtual visits or [facility] if any further questions or concerns to avoid delays in care.   I discussed the assessment and treatment plan with the patient. The patient was provided an opportunity to ask questions and all were answered. The patient agreed with the plan and demonstrated an understanding of the instructions.  Karen Kern, DO

## 2022-01-04 ENCOUNTER — Encounter: Payer: Self-pay | Admitting: Family Medicine

## 2022-01-04 NOTE — Telephone Encounter (Signed)
[facility] Night - Client TELEPHONE ADVICE RECORD [facility] Patient Name: Karen Saunders [place] Gender: Female DOB: Apr 27, 1955 Age: 65 Y 2 M 13 D Return Phone Number: [phone] (Primary) Address: City/ State/ Zip: [place]  [address] Client [facility] Night - Client Client Site [facility] - Night Provider Raechel Ache - MD Contact Type Call Who Is Calling Patient / Member / Family / Caregiver Call Type Triage / Clinical Relationship To Patient Self Return Phone Number [phone] (Primary) Chief Complaint Vomiting Reason for Call Symptomatic / Request for Health Information Initial Comment Caller says she had a virtual visit on Monday. She says she is vomiting non stop per caller. Translation No Nurse Assessment Nurse: Clare Gandy, RN, Dahlia Client Date/Time (Eastern Time): 12/31/2021 1:14:20 PM Confirm and document reason for call. If symptomatic, describe symptoms. ---Caller reports having vomiting x2 started last night. Denies fever Does the patient have any new or worsening symptoms? ---Yes Will a triage be completed? ---Yes Related visit to physician within the last 2 weeks? ---No Does the PT have any chronic conditions? (i.e. diabetes, asthma, this includes High risk factors for pregnancy, etc.) ---Yes List chronic conditions. ---hear disease, HTN Is this a behavioral health or substance abuse call? ---No Guidelines Guideline Title Affirmed Question Affirmed Notes Nurse Date/Time (Eastern Time) COVID-19 - Diagnosed or Suspected [1] COVID-19 diagnosed AND [2] has mild nausea, vomiting or diarrhea Debby Freiberg 12/31/2021 1:19:08 PM Disp. Time Lamount Cohen Time) Disposition Final User 12/31/2021 1:22:58 PM Home Care Yes Clare Gandy, RN, Dahlia Client Final Disposition 12/31/2021 1:22:58 PM Home Care Yes Riddle, RN, Dahlia Client PLEASE NOTE: All timestamps contained within this report are represented as  [place] Standard Time. CONFIDENTIALTY NOTICE: This fax transmission is intended only for the addressee. It contains information that is legally privileged, confidential or otherwise protected from use or disclosure. If you are not the intended recipient, you are strictly prohibited from reviewing, disclosing, copying using or disseminating any of this information or taking any action in reliance on or regarding this information. If you have received this fax in error, please notify us immediately by telephone so that we can arrange for its return to [place]. Phone: [phone], Toll-Free: [phone], Fax: [phone] Page: 2 of 2 Call Id: [id number] Caller Disagree/Comply Comply Caller Understands Yes PreDisposition Call Doctor Care Advice Given Per Guideline HOME CARE: * You should be able to treat this at home. * MILD NAUSEA OR VOMITING: Sip small amounts (1 tablespoon or 15 ml) of water or half-strength sports drink every 5 minutes. After 4 hours with no vomiting, slowly increase the amount. After no vomiting for 8 hours, slowly add in bland foods - saltine crackers, white bread, rice, mashed potatoes. * Vomiting lasts more than 2 days (48 hours) CALL BACK IF: * Vomit contains bile (green color) * Signs of dehydration (e.g., no urination over 12 hours, very dry mouth, very lightheaded) * You become worse * Constant stomach pain lasting more than 2 hour

## 2022-01-04 NOTE — Telephone Encounter (Signed)
I spoke with Karen Saunders; Karen Saunders said that vomiting has stopped. Karen Saunders went to [facility] UC and med given for vomiting.Karen Saunders does have scratchy throat with a dry cough. Benzonatate is not helping cough and Karen Saunders does not do codeine due to causing nausea. Karen Saunders is with her mom due to someone stays all the time due to pts mom dementia. Karen Saunders will leave pts mom home in [place] today at 7 pm and Karen Saunders said she knows an UC and she will go to UC today for someone to listen to her lungs and CXR if needed. UC & ED precautions given and Karen Saunders voiced understanding. Asked Karen Saunders to give our office cb with update on how she is doing. Sending note to Dr Para March who is out of office, DR G who is in office and [place] pool.

## 2022-01-04 NOTE — Telephone Encounter (Signed)
ENT appt for chronic cough was cancelled due to COVID (diagnosed last week).  Would offer hydrocodone cough syrup (tussionex) to try if better tolerated than codeine.  Rec OV with PCP for f/u when he's back in office.

## 2022-01-07 ENCOUNTER — Emergency Department (HOSPITAL_BASED_OUTPATIENT_CLINIC_OR_DEPARTMENT_OTHER)
Admission: EM | Admit: 2022-01-07 | Discharge: 2022-01-07 | Disposition: A | Discharge status: Home / Self Care | Payer: Medicare Other | Attending: Emergency Medicine | Admitting: Emergency Medicine

## 2022-01-07 ENCOUNTER — Other Ambulatory Visit: Payer: Self-pay

## 2022-01-07 ENCOUNTER — Emergency Department (HOSPITAL_BASED_OUTPATIENT_CLINIC_OR_DEPARTMENT_OTHER): Payer: Medicare Other | Admitting: Radiology

## 2022-01-07 DIAGNOSIS — I10 Essential (primary) hypertension: Secondary | ICD-10-CM | POA: Insufficient documentation

## 2022-01-07 DIAGNOSIS — R059 Cough, unspecified: Secondary | ICD-10-CM | POA: Diagnosis present

## 2022-01-07 DIAGNOSIS — Z79899 Other long term (current) drug therapy: Secondary | ICD-10-CM | POA: Insufficient documentation

## 2022-01-07 DIAGNOSIS — I2510 Atherosclerotic heart disease of native coronary artery without angina pectoris: Secondary | ICD-10-CM | POA: Diagnosis not present

## 2022-01-07 DIAGNOSIS — U071 COVID-19: Secondary | ICD-10-CM | POA: Insufficient documentation

## 2022-01-07 DIAGNOSIS — Z7982 Long term (current) use of aspirin: Secondary | ICD-10-CM | POA: Insufficient documentation

## 2022-01-07 DIAGNOSIS — R051 Acute cough: Secondary | ICD-10-CM

## 2022-01-07 LAB — RESP PANEL BY RT-PCR (RSV, FLU A&B, COVID)  RVPGX2
Influenza A by PCR: NEGATIVE
Influenza B by PCR: NEGATIVE
Resp Syncytial Virus by PCR: NEGATIVE
SARS Coronavirus 2 by RT PCR: POSITIVE — AB

## 2022-01-07 LAB — GROUP A STREP BY PCR: Group A Strep by PCR: NOT DETECTED

## 2022-01-07 MED ORDER — LIDOCAINE HCL 2 % IJ SOLN
5.0000 mL | Freq: Once | INTRAMUSCULAR | Status: AC
  Administered 2022-01-07: 100 mg
  Filled 2022-01-07: qty 20

## 2022-01-07 MED ORDER — HYDROCODONE-ACETAMINOPHEN 7.5-325 MG/15ML PO SOLN: 10.0000 mL | Freq: Four times a day (QID) | ORAL | 0 refills | Status: DC | PRN

## 2022-01-07 NOTE — ED Notes (Signed)
Patient coughing forcefully and having trouble sitting still.  Will monitor BP when brought back to room. Patient also states she has not taken her blood pressure medication in a couple of days.

## 2022-01-07 NOTE — ED Notes (Signed)
Pt given discharge instructions and reviewed prescriptions. Opportunities given for questions. Pt verbalizes understanding. Wylee Dorantes R, RN 

## 2022-01-07 NOTE — ED Notes (Signed)
Patient transported to X-ray 

## 2022-01-07 NOTE — ED Provider Notes (Signed)
[facility] EMERGENCY DEPT Provider Note   CSN: [id number] Arrival date & time: 01/07/22  0865     History  Chief Complaint  Patient presents with   Cough    Karen Saunders is a 66 y.o. female.   Cough Patient with cough.  States has had difficulty sleeping due to it.  No definite sick contacts.  Had been diagnosed with COVID on the 19th of this month.  States over the last couple days the cough is in her throat.  States she has had a chronic cough in her chest but this is different.  States she is feeling better from a COVID infection otherwise. Has seen ENT and pulmonology for cough previously.    Past Medical History:  Diagnosis Date   Anemia    with prev unremarkable heme eval ~2011   CAD (coronary artery disease)    Dr. Donnie Aho with Cards   History of gastric ulcer    2005    Hyperlipidemia    Hypertension    IBS (irritable bowel syndrome)    Leukopenia    with prev unremarkable heme eval ~2011   Migraine    Unsp w/o intract w/o status migrainosus   Osteoporosis 11/18/2004   Shingles    Trigeminal neuralgia 03/05/2012    Home Medications Prior to Admission medications   Medication Sig Start Date End Date Taking? Authorizing Provider  HYDROcodone-acetaminophen (HYCET) 7.5-325 mg/15 ml solution Take 10 mLs by mouth every 6 (six) hours as needed (cough). 01/07/22 01/07/23 Yes Benjiman Core, MD  amLODipine (NORVASC) 5 MG tablet Take 2 tablets (10 mg total) by mouth daily. 08/31/21   Joaquim Nam, MD  aspirin 81 MG tablet Take 81 mg by mouth daily.    [provider]  benzonatate (TESSALON PERLES) 100 MG capsule 1-2 capsules up to twice daily as needed for cough. 12/28/21   Terressa Koyanagi, DO  carbamazepine (TEGRETOL) 200 MG tablet TAKE 2 TABLETS IN THE MORNING AND 2 TABLETS AT NIGHT 06/25/21   Joaquim Nam, MD  dexlansoprazole (DEXILANT) 60 MG capsule Take 60 mg by mouth daily.    [provider]  ezetimibe (ZETIA) 10 MG  tablet TAKE 1 TABLET EVERY DAY 08/18/21   Lyn Records, MD  famotidine (PEPCID) 20 MG tablet Take 1 tablet (20 mg total) by mouth 2 (two) times daily. 07/09/21   Joaquim Nam, MD  gabapentin (NEURONTIN) 100 MG capsule Take 1 capsule (100 mg total) by mouth daily as needed (for cough). 11/15/21   Joaquim Nam, MD  hydrochlorothiazide (HYDRODIURIL) 25 MG tablet Take 1 tablet (25 mg total) by mouth daily. 07/15/21   Joaquim Nam, MD  methylPREDNISolone (MEDROL DOSEPAK) 4 MG TBPK tablet TAKE 6 TABLETS ON DAY 1 AS DIRECTED ON PACKAGE AND DECREASE BY 1 TAB EACH DAY FOR A TOTAL OF 6 DAYS 12/17/21   [provider]  metoprolol tartrate (LOPRESSOR) 50 MG tablet Take 0.5 tablets (25 mg total) by mouth 2 (two) times daily. 11/15/21   Joaquim Nam, MD  ondansetron (ZOFRAN) 8 MG tablet Take 8 mg by mouth 3 (three) times daily as needed. 01/01/22   [provider]  promethazine (PHENERGAN) 12.5 MG suppository Place 12.5 mg rectally every 6 (six) hours as needed. 01/01/22   [provider]  rosuvastatin (CRESTOR) 40 MG tablet Take 1 tablet (40 mg total) by mouth daily. 08/27/21 08/28/22  Swinyer, Zachary George, NP  spironolactone (ALDACTONE) 25 MG tablet TAKE 1 TABLET  BY MOUTH EVERY DAY 08/19/21   Lyn Records, MD  traZODone (DESYREL) 50 MG tablet Take 0.5-1 tablets (25-50 mg total) by mouth at bedtime as needed for sleep. 10/08/21   Joaquim Nam, MD      Allergies    Amoxicillin-pot clavulanate, Azithromycin, Codeine, Delsym [dextromethorphan], Doxycycline, Ibandronate sodium, Iohexol, Raloxifene, Sulfonamide derivatives, Zoledronic acid, and Iodinated contrast media    Review of Systems   Review of Systems  Respiratory:  Positive for cough.     Physical Exam Updated Vital Signs BP (!) 181/97   Pulse (!) 52   Temp 98.8 F (37.1 C) (Oral)   Resp 18   Wt 49.9 kg   SpO2 99%   BMI 20.12 kg/m  Physical Exam Vitals and nursing note reviewed.  HENT:     Mouth/Throat:      Pharynx: Posterior oropharyngeal erythema present.     Comments: Mild posterior pharyngeal erythema without exudate. Cardiovascular:     Rate and Rhythm: Normal rate.  Pulmonary:     Breath sounds: No wheezing or rhonchi.  Abdominal:     Tenderness: There is no abdominal tenderness.  Musculoskeletal:        General: No tenderness.  Skin:    General: Skin is warm.  Neurological:     Mental Status: She is alert and oriented to person, place, and time.     ED Results / Procedures / Treatments   Labs (all labs ordered are listed, but only abnormal results are displayed) Labs Reviewed  RESP PANEL BY RT-PCR (RSV, FLU A&B, COVID)  RVPGX2 - Abnormal; Notable for the following components:      Result Value   SARS Coronavirus 2 by RT PCR POSITIVE (*)    All other components within normal limits  GROUP A STREP BY PCR    EKG None  Radiology DG Neck Soft Tissue  Result Date: 01/07/2022 CLINICAL DATA:  Cough. EXAM: NECK SOFT TISSUES - 1+ VIEW COMPARISON:  None Available. FINDINGS: There is no evidence of retropharyngeal soft tissue swelling or epiglottic enlargement. The cervical airway is unremarkable and no radio-opaque foreign body identified. IMPRESSION: Negative. Electronically Signed   By: Lupita Raider M.D.   On: 01/07/2022 10:51    Procedures Procedures    Medications Ordered in ED Medications  lidocaine (XYLOCAINE) 2 % (with pres) injection 100 mg (100 mg Other Given 01/07/22 1046)    ED Course/ Medical Decision Making/ A&P                           Medical Decision Making Amount and/or Complexity of Data Reviewed Radiology: ordered.  Risk Prescription drug management.   Patient with cough.  Has had chronic cough for a while but states this 1 is different.  Now more up in her throat.  No real nasal congestion.  No relief with bends need at home.  Recent COVID infection.  Will get x-ray of the neck and will nebulize lidocaine to see if it will help.  Also  will check strep since does have some throat erythema.  Acute on chronic cough.  Workup reassuring.  Negative viral testing except for COVID which she has been positive for almost 2 weeks now.  Potentially is a cause of the worsening cough however.  Pharyngeal x-ray reassuring.  Will discharge home.        Final Clinical Impression(s) / ED Diagnoses Final diagnoses:  Acute cough  COVID-19  Rx / DC Orders ED Discharge Orders          Ordered    HYDROcodone-acetaminophen (HYCET) 7.5-325 mg/15 ml solution  Every 6 hours PRN        01/07/22 1214              Benjiman Core, MD 01/07/22 1542

## 2022-01-07 NOTE — ED Triage Notes (Addendum)
Diagnosed with covid on 12/19 and presents today persistent dry cough and nausea.  She was given benzonatate by PCP without relief. Has tried gargling with salt water.  Otherwise felling better from the 19th, no fever or chills.   BP is elevated in triage - coughing during measurement, also has not taken BP medication today

## 2022-01-07 NOTE — ED Notes (Signed)
RT note: Pt was unable to tolerate nebulized Lidacaine after X1-2 breaths, had strong/non/productive dry cough, (+)COVID resulted.

## 2022-01-18 ENCOUNTER — Inpatient Hospital Stay (HOSPITAL_COMMUNITY)
Admission: EM | Admit: 2022-01-18 | Discharge: 2022-01-28 | DRG: 086 | Disposition: A | Discharge status: Home Health | Payer: Medicare Other | Attending: Neurosurgery | Admitting: Neurosurgery

## 2022-01-18 ENCOUNTER — Emergency Department (HOSPITAL_COMMUNITY): Payer: Medicare Other

## 2022-01-18 ENCOUNTER — Other Ambulatory Visit: Payer: Self-pay

## 2022-01-18 ENCOUNTER — Encounter (HOSPITAL_COMMUNITY): Payer: Self-pay

## 2022-01-18 DIAGNOSIS — D696 Thrombocytopenia, unspecified: Secondary | ICD-10-CM | POA: Diagnosis not present

## 2022-01-18 DIAGNOSIS — Z833 Family history of diabetes mellitus: Secondary | ICD-10-CM

## 2022-01-18 DIAGNOSIS — Z1152 Encounter for screening for COVID-19: Secondary | ICD-10-CM

## 2022-01-18 DIAGNOSIS — K224 Dyskinesia of esophagus: Secondary | ICD-10-CM | POA: Diagnosis not present

## 2022-01-18 DIAGNOSIS — I10 Essential (primary) hypertension: Secondary | ICD-10-CM | POA: Diagnosis present

## 2022-01-18 DIAGNOSIS — D709 Neutropenia, unspecified: Secondary | ICD-10-CM | POA: Diagnosis present

## 2022-01-18 DIAGNOSIS — Y92009 Unspecified place in unspecified non-institutional (private) residence as the place of occurrence of the external cause: Secondary | ICD-10-CM | POA: Diagnosis not present

## 2022-01-18 DIAGNOSIS — Z818 Family history of other mental and behavioral disorders: Secondary | ICD-10-CM

## 2022-01-18 DIAGNOSIS — Z880 Allergy status to penicillin: Secondary | ICD-10-CM

## 2022-01-18 DIAGNOSIS — G43909 Migraine, unspecified, not intractable, without status migrainosus: Secondary | ICD-10-CM | POA: Diagnosis present

## 2022-01-18 DIAGNOSIS — E876 Hypokalemia: Secondary | ICD-10-CM | POA: Diagnosis not present

## 2022-01-18 DIAGNOSIS — M810 Age-related osteoporosis without current pathological fracture: Secondary | ICD-10-CM | POA: Diagnosis present

## 2022-01-18 DIAGNOSIS — I2510 Atherosclerotic heart disease of native coronary artery without angina pectoris: Secondary | ICD-10-CM | POA: Diagnosis present

## 2022-01-18 DIAGNOSIS — K219 Gastro-esophageal reflux disease without esophagitis: Secondary | ICD-10-CM | POA: Diagnosis present

## 2022-01-18 DIAGNOSIS — I447 Left bundle-branch block, unspecified: Secondary | ICD-10-CM | POA: Diagnosis present

## 2022-01-18 DIAGNOSIS — K5900 Constipation, unspecified: Secondary | ICD-10-CM | POA: Diagnosis not present

## 2022-01-18 DIAGNOSIS — S066X1A Traumatic subarachnoid hemorrhage with loss of consciousness of 30 minutes or less, initial encounter: Secondary | ICD-10-CM | POA: Diagnosis present

## 2022-01-18 DIAGNOSIS — Z888 Allergy status to other drugs, medicaments and biological substances status: Secondary | ICD-10-CM

## 2022-01-18 DIAGNOSIS — Z8711 Personal history of peptic ulcer disease: Secondary | ICD-10-CM

## 2022-01-18 DIAGNOSIS — E785 Hyperlipidemia, unspecified: Secondary | ICD-10-CM | POA: Diagnosis present

## 2022-01-18 DIAGNOSIS — Z823 Family history of stroke: Secondary | ICD-10-CM | POA: Diagnosis not present

## 2022-01-18 DIAGNOSIS — R296 Repeated falls: Secondary | ICD-10-CM | POA: Diagnosis present

## 2022-01-18 DIAGNOSIS — Z881 Allergy status to other antibiotic agents status: Secondary | ICD-10-CM

## 2022-01-18 DIAGNOSIS — E871 Hypo-osmolality and hyponatremia: Secondary | ICD-10-CM | POA: Diagnosis not present

## 2022-01-18 DIAGNOSIS — Z8261 Family history of arthritis: Secondary | ICD-10-CM

## 2022-01-18 DIAGNOSIS — K580 Irritable bowel syndrome with diarrhea: Secondary | ICD-10-CM | POA: Diagnosis present

## 2022-01-18 DIAGNOSIS — T1490XA Injury, unspecified, initial encounter: Secondary | ICD-10-CM | POA: Diagnosis present

## 2022-01-18 DIAGNOSIS — Z8249 Family history of ischemic heart disease and other diseases of the circulatory system: Secondary | ICD-10-CM

## 2022-01-18 DIAGNOSIS — Z8616 Personal history of COVID-19: Secondary | ICD-10-CM

## 2022-01-18 DIAGNOSIS — I1A0 Resistant hypertension: Secondary | ICD-10-CM | POA: Diagnosis present

## 2022-01-18 DIAGNOSIS — I608 Other nontraumatic subarachnoid hemorrhage: Secondary | ICD-10-CM | POA: Diagnosis present

## 2022-01-18 DIAGNOSIS — Z9181 History of falling: Secondary | ICD-10-CM

## 2022-01-18 DIAGNOSIS — R112 Nausea with vomiting, unspecified: Secondary | ICD-10-CM | POA: Diagnosis not present

## 2022-01-18 DIAGNOSIS — Z951 Presence of aortocoronary bypass graft: Secondary | ICD-10-CM

## 2022-01-18 DIAGNOSIS — I160 Hypertensive urgency: Secondary | ICD-10-CM | POA: Diagnosis present

## 2022-01-18 DIAGNOSIS — Z882 Allergy status to sulfonamides status: Secondary | ICD-10-CM

## 2022-01-18 DIAGNOSIS — Z79899 Other long term (current) drug therapy: Secondary | ICD-10-CM

## 2022-01-18 DIAGNOSIS — E860 Dehydration: Secondary | ICD-10-CM | POA: Diagnosis present

## 2022-01-18 DIAGNOSIS — W19XXXA Unspecified fall, initial encounter: Secondary | ICD-10-CM | POA: Diagnosis present

## 2022-01-18 DIAGNOSIS — Z91041 Radiographic dye allergy status: Secondary | ICD-10-CM

## 2022-01-18 DIAGNOSIS — Z885 Allergy status to narcotic agent status: Secondary | ICD-10-CM

## 2022-01-18 LAB — CBC WITH DIFFERENTIAL/PLATELET
Abs Immature Granulocytes: 0.02 10*3/uL (ref 0.00–0.07)
Basophils Absolute: 0 10*3/uL (ref 0.0–0.1)
Basophils Relative: 0 %
Eosinophils Absolute: 0 10*3/uL (ref 0.0–0.5)
Eosinophils Relative: 0 %
HCT: 31.4 % — ABNORMAL LOW (ref 36.0–46.0)
Hemoglobin: 11.1 g/dL — ABNORMAL LOW (ref 12.0–15.0)
Immature Granulocytes: 0 %
Lymphocytes Relative: 6 %
Lymphs Abs: 0.3 10*3/uL — ABNORMAL LOW (ref 0.7–4.0)
MCH: 31.4 pg (ref 26.0–34.0)
MCHC: 35.4 g/dL (ref 30.0–36.0)
MCV: 88.7 fL (ref 80.0–100.0)
Monocytes Absolute: 0.2 10*3/uL (ref 0.1–1.0)
Monocytes Relative: 4 %
Neutro Abs: 4.7 10*3/uL (ref 1.7–7.7)
Neutrophils Relative %: 90 %
Platelets: 133 10*3/uL — ABNORMAL LOW (ref 150–400)
RBC: 3.54 MIL/uL — ABNORMAL LOW (ref 3.87–5.11)
RDW: 11.8 % (ref 11.5–15.5)
WBC: 5.2 10*3/uL (ref 4.0–10.5)
nRBC: 0 % (ref 0.0–0.2)

## 2022-01-18 LAB — COMPREHENSIVE METABOLIC PANEL
ALT: 26 U/L (ref 0–44)
AST: 40 U/L (ref 15–41)
Albumin: 4.1 g/dL (ref 3.5–5.0)
Alkaline Phosphatase: 71 U/L (ref 38–126)
Anion gap: 13 (ref 5–15)
BUN: 17 mg/dL (ref 8–23)
CO2: 25 mmol/L (ref 22–32)
Calcium: 9.2 mg/dL (ref 8.9–10.3)
Chloride: 91 mmol/L — ABNORMAL LOW (ref 98–111)
Creatinine, Ser: 0.91 mg/dL (ref 0.44–1.00)
GFR, Estimated: >60 mL/min (ref >60)
Glucose, Bld: 131 mg/dL — ABNORMAL HIGH (ref 70–99)
Potassium: 3.9 mmol/L (ref 3.5–5.1)
Sodium: 129 mmol/L — ABNORMAL LOW (ref 135–145)
Total Bilirubin: 0.4 mg/dL (ref 0.3–1.2)
Total Protein: 7.1 g/dL (ref 6.5–8.1)

## 2022-01-18 LAB — LIPASE, BLOOD: Lipase: 38 U/L (ref 11–51)

## 2022-01-18 LAB — RESP PANEL BY RT-PCR (RSV, FLU A&B, COVID)  RVPGX2
Influenza A by PCR: NEGATIVE
Influenza B by PCR: NEGATIVE
Resp Syncytial Virus by PCR: NEGATIVE
SARS Coronavirus 2 by RT PCR: NEGATIVE

## 2022-01-18 LAB — CBG MONITORING, ED: Glucose-Capillary: 131 mg/dL — ABNORMAL HIGH (ref 70–99)

## 2022-01-18 LAB — PROTIME-INR
INR: 1 (ref 0.8–1.2)
Prothrombin Time: 13.4 seconds (ref 11.4–15.2)

## 2022-01-18 MED ORDER — LACTATED RINGERS IV BOLUS
1000.0000 mL | Freq: Once | INTRAVENOUS | Status: AC
  Administered 2022-01-18: 1000 mL via INTRAVENOUS

## 2022-01-18 MED ORDER — DIPHENHYDRAMINE HCL 50 MG/ML IJ SOLN: 50.0000 mg | Freq: Once | INTRAMUSCULAR | Status: DC

## 2022-01-18 MED ORDER — MORPHINE SULFATE (PF) 4 MG/ML IV SOLN
4.0000 mg | Freq: Once | INTRAVENOUS | Status: AC
  Administered 2022-01-18: 4 mg via INTRAVENOUS
  Filled 2022-01-18: qty 1

## 2022-01-18 MED ORDER — ONDANSETRON HCL 4 MG/2ML IJ SOLN
4.0000 mg | Freq: Once | INTRAMUSCULAR | Status: AC
  Administered 2022-01-18: 4 mg via INTRAVENOUS
  Filled 2022-01-18: qty 2

## 2022-01-18 MED ORDER — DIPHENHYDRAMINE HCL 25 MG PO CAPS: 50.0000 mg | ORAL_CAPSULE | Freq: Once | ORAL | Status: DC

## 2022-01-18 MED ORDER — ONDANSETRON 4 MG PO TBDP
8.0000 mg | ORAL_TABLET | Freq: Once | ORAL | Status: DC
  Filled 2022-01-18: qty 1

## 2022-01-18 MED ORDER — METHYLPREDNISOLONE SODIUM SUCC 40 MG IJ SOLR
40.0000 mg | Freq: Once | INTRAMUSCULAR | Status: AC
  Administered 2022-01-18: 40 mg via INTRAVENOUS
  Filled 2022-01-18: qty 1

## 2022-01-18 MED ORDER — NICARDIPINE HCL IN NACL 20-0.86 MG/200ML-% IV SOLN
3.0000 mg/h | INTRAVENOUS | Status: DC
  Administered 2022-01-18 – 2022-01-20 (×3): 5 mg/h via INTRAVENOUS
  Administered 2022-01-20: 3 mg/h via INTRAVENOUS
  Administered 2022-01-21: 5 mg/h via INTRAVENOUS
  Administered 2022-01-21: 7.5 mg/h via INTRAVENOUS
  Administered 2022-01-21: 5 mg/h via INTRAVENOUS
  Filled 2022-01-18 (×10): qty 200

## 2022-01-18 NOTE — ED Triage Notes (Signed)
Patient BIB [facility] from home. Nausea, vomiting, diarrhea since 3pm.

## 2022-01-18 NOTE — ED Provider Triage Note (Signed)
Emergency Medicine Provider Triage Evaluation Note  Karen Saunders , a 67 y.o. female  was evaluated in triage.  Pt complains of nausea and vomiting.  Patient also reports that she woke up this morning with a headache that has been persistent throughout the entire day.  She denies any bloody vomit.  Reports some associated nausea.  Denies any known sick contacts, or any questionable food that she has eaten recently.  History of heart disease.  Review of Systems  Positive: As above Negative: As above  Physical Exam  BP (!) 198/114   Pulse 75   Temp 97.7 F (36.5 C) (Axillary)   Resp 20   SpO2 97%  Gen:   Awake, generally uncomfortable Resp:  Normal effort  MSK:   Moves extremities without difficulty  Other:  No identifiable location of abdominal pain  Medical Decision Making  Medically screening exam initiated at 7:09 PM.  Appropriate orders placed.  CLARINDA OBI was informed that the remainder of the evaluation will be completed by another provider, this initial triage assessment does not replace that evaluation, and the importance of remaining in the ED until their evaluation is complete.     Luvenia Heller, PA-C 01/18/22 1911

## 2022-01-18 NOTE — ED Provider Notes (Signed)
[facility] DEPT Provider Note   CSN: [id number] Arrival date & time: 01/18/22  1850     History Chief Complaint  Patient presents with   Emesis    HPI Karen Saunders is a 67 y.o. female presenting for 2 chief complaints.  She endorses a day of diarrhea, dehydration, vomiting.  However she has fallen multiple times today trying to take care of her symptoms.  She has started to have a severe headache that is diffuse.  She is brought in by a friend who states that she is confused.  Patient is GCS 15 but occasionally disoriented to situation. Patient denies any blood thinner use.  Patient's recorded medical, surgical, social, medication list and allergies were reviewed in the Snapshot window as part of the initial history.   Review of Systems   Review of Systems  Constitutional:  Negative for chills and fever.  HENT:  Negative for ear pain and sore throat.   Eyes:  Negative for pain and visual disturbance.  Respiratory:  Negative for cough and shortness of breath.   Cardiovascular:  Negative for chest pain and palpitations.  Gastrointestinal:  Positive for diarrhea, nausea and vomiting. Negative for abdominal pain.  Genitourinary:  Negative for dysuria and hematuria.  Musculoskeletal:  Negative for arthralgias and back pain.  Skin:  Negative for color change and rash.  Neurological:  Positive for headaches. Negative for seizures and syncope.  Psychiatric/Behavioral:  Positive for confusion.   All other systems reviewed and are negative.   Physical Exam Updated Vital Signs BP (!) 197/94   Pulse 65   Temp 97.9 F (36.6 C)   Resp 18   SpO2 95%  Physical Exam Vitals and nursing note reviewed.  Constitutional:      General: She is not in acute distress.    Appearance: She is well-developed.  HENT:     Head: Normocephalic and atraumatic.  Eyes:     Conjunctiva/sclera: Conjunctivae normal.  Cardiovascular:     Rate and Rhythm: Normal rate and  regular rhythm.     Heart sounds: No murmur heard. Pulmonary:     Effort: Pulmonary effort is normal. No respiratory distress.     Breath sounds: Normal breath sounds.  Abdominal:     General: There is no distension.     Palpations: Abdomen is soft.     Tenderness: There is abdominal tenderness. There is no right CVA tenderness, left CVA tenderness or guarding.  Musculoskeletal:        General: No swelling or tenderness. Normal range of motion.     Cervical back: Neck supple.  Skin:    General: Skin is warm and dry.  Neurological:     General: No focal deficit present.     Mental Status: She is alert and oriented to person, place, and time. Mental status is at baseline.     Cranial Nerves: No cranial nerve deficit.      ED Course/ Medical Decision Making/ A&P    Procedures .Critical Care  Performed by: Tretha Sciara, MD Authorized by: Tretha Sciara, MD   Critical care provider statement:    Critical care time (minutes):  30   Critical care was necessary to treat or prevent imminent or life-threatening deterioration of the following conditions:  CNS failure or compromise and trauma   Critical care was time spent personally by me on the following activities:  Development of treatment plan with patient or surrogate, discussions with consultants, evaluation of patient's response to  treatment, examination of patient, ordering and review of laboratory studies, ordering and review of radiographic studies, ordering and performing treatments and interventions, pulse oximetry, re-evaluation of patient's condition and review of old charts   I assumed direction of critical care for this patient from another provider in my specialty: no      Medications Ordered in ED Medications  ondansetron (ZOFRAN-ODT) disintegrating tablet 8 mg (8 mg Oral Not Given 01/18/22 2138)  diphenhydrAMINE (BENADRYL) capsule 50 mg (has no administration in time range)    Or  diphenhydrAMINE (BENADRYL)  injection 50 mg (has no administration in time range)  nicardipine (CARDENE) 20mg  in 0.86% saline 261ml IV infusion (0.1 mg/ml) (5 mg/hr Intravenous New Bag/Given 01/18/22 2250)  morphine (PF) 4 MG/ML injection 4 mg (has no administration in time range)  lactated ringers bolus 1,000 mL (0 mLs Intravenous Stopped 01/18/22 2227)  ondansetron (ZOFRAN) injection 4 mg (4 mg Intravenous Given 01/18/22 2137)  morphine (PF) 4 MG/ML injection 4 mg (4 mg Intravenous Given 01/18/22 2137)  methylPREDNISolone sodium succinate (SOLU-MEDROL) 40 mg/mL injection 40 mg (40 mg Intravenous Given 01/18/22 2246)    Medical Decision Making:    Karen Saunders is a 67 y.o. female who presented to the ED today with multiple complaints detailed above.     Patient's presentation is complicated by their history of advanced age.  Patient placed on continuous vitals and telemetry monitoring while in ED which was reviewed periodically.   Complete initial physical exam performed, notably the patient  was hemodynamically stable in no acute distress.  She isGrossly hypertensive.  She has diffuse abdominal tenderness without any abdominal guarding.  She answers some questions about her situation incorrectly and requires repeat prompting for questions.      Reviewed and confirmed nursing documentation for past medical history, family history, social history.    Initial Assessment:   With the patient's presentation of headache, nausea, vomiting, multiple falls, most likely diagnosis is gastroenteritis likely developing dehydration and near syncopal events. Other diagnoses were considered including (but not limited to) critical anemia, small bowel obstruction, gastroenteritis. These are considered less likely due to history of present illness and physical exam findings.   This is most consistent with an acute life/limb threatening illness complicated by underlying chronic conditions.  Initial Plan:  CT head to evaluate for structural  intracranial abnormality CT abdomen pelvis without contrast to evaluate for structural intra-abdominal etiology of patient's nausea vomiting diarrhea Screening labs including CBC and Metabolic panel to evaluate for infectious or metabolic etiology of disease.  Urinalysis with reflex culture ordered to evaluate for UTI or relevant urologic/nephrologic pathology.  CXR to evaluate for structural/infectious intrathoracic pathology.  EKG to evaluate for cardiac pathology. Objective evaluation as below reviewed with plan for close reassessment  Initial Study Results:   Laboratory  All laboratory results reviewed without evidence of clinically relevant pathology.     EKG EKG was reviewed independently. Rate, rhythm, axis, intervals all examined and without medically relevant abnormality. ST segments without concerns for elevations.    Radiology  All images reviewed independently. Agree with radiology report at this time.   CT HEAD WO CONTRAST (5MM)  Result Date: 01/18/2022 CLINICAL DATA:  Initial evaluation for acute headache, nausea, vomiting. EXAM: CT HEAD WITHOUT CONTRAST TECHNIQUE: Contiguous axial images were obtained from the base of the skull through the vertex without intravenous contrast. RADIATION DOSE REDUCTION: This exam was performed according to the departmental dose-optimization program which includes automated exposure control, adjustment of the mA  and/or kV according to patient size and/or use of iterative reconstruction technique. COMPARISON:  None Available. FINDINGS: Brain: Cerebral volume within normal limits. Mild chronic microvascular ischemic disease. Scattered acute subarachnoid hemorrhage seen involving the bilateral cerebral hemispheres, most pronounced at the frontal lobes near the vertex. Subarachnoid blood seen within the left sylvian fissure. Possible small extra-axial component overlies the left frontotemporal convexity, measuring up to 2 mm in thickness (series 4, image  30). Trace acute subdural hemorrhage seen along the right tentorium, measuring 3 mm in maximal thickness without mass effect. No intraparenchymal or intraventricular hemorrhage. No acute large vessel territory infarct. No mass lesion or midline shift. No hydrocephalus. Vascular: No abnormal hyperdense vessel. Scattered vascular calcifications noted within the carotid siphons. Skull: Scalp soft tissues demonstrate no acute finding. Calvarium intact. Sinuses/Orbits: Globes orbital soft tissues within normal limits. Paranasal sinuses and mastoid air cells are clear. Other: None. IMPRESSION: 1. Scattered acute subarachnoid hemorrhage involving the bilateral cerebral hemispheres, most pronounced at the frontal lobes near the vertex. 2. Trace acute subdural hemorrhage along the right tentorium, measuring 3 mm in maximal thickness. Additional possible trace extra-axial component overlying the left cerebral convexity. No mass effect. 3. Underlying mild chronic microvascular ischemic disease. Critical Value/emergent results were called by telephone at the time of interpretation on 01/18/2022 at 10:30 pm to provider [facility] , who verbally acknowledged these results. Electronically Signed   By: Jeannine Boga M.D.   On: 01/18/2022 22:33   CT ABDOMEN PELVIS WO CONTRAST  Result Date: 01/18/2022 CLINICAL DATA:  Abdominal pain, acute, nonlocalized EXAM: CT ABDOMEN AND PELVIS WITHOUT CONTRAST TECHNIQUE: Multidetector CT imaging of the abdomen and pelvis was performed following the standard protocol without IV contrast. RADIATION DOSE REDUCTION: This exam was performed according to the departmental dose-optimization program which includes automated exposure control, adjustment of the mA and/or kV according to patient size and/or use of iterative reconstruction technique. COMPARISON:  05/07/2021 FINDINGS: Lower chest: Median sternotomy has been performed. Coronary artery calcifications noted. Global cardiac size  within normal limits. Visualized lung bases are clear. Hepatobiliary: No focal liver abnormality is seen. No gallstones, gallbladder wall thickening, or biliary dilatation. Pancreas: Unremarkable Spleen: Unremarkable Adrenals/Urinary Tract: The adrenal glands are unremarkable. The kidneys are normal in size and position. Vascular calcifications are noted within the renal hila bilaterally, right greater than left. No urinary renal or ureteral calculi. No hydronephrosis. No perinephric inflammatory stranding or fluid collections are seen. The bladder is unremarkable. Stomach/Bowel: Mild sigmoid diverticulosis without superimposed acute inflammatory change. The stomach, small bowel, and large bowel are otherwise unremarkable. Appendix normal. No free intraperitoneal gas or fluid. Vascular/Lymphatic: Mild aortoiliac atherosclerotic calcification. No aortic aneurysm. No pathologic adenopathy within the abdomen and pelvis. Reproductive: Status post hysterectomy. No adnexal masses. Other: Small fat containing umbilical hernia. Musculoskeletal: Osseous structures are age-appropriate. No acute bone abnormality. No lytic or blastic bone lesion. IMPRESSION: 1. No acute intra-abdominal pathology identified. No definite radiographic explanation for the patient's reported symptoms. 2. Coronary artery calcifications. 3. Mild sigmoid diverticulosis without superimposed acute inflammatory change. Aortic Atherosclerosis (ICD10-I70.0). Electronically Signed   By: Fidela Salisbury M.D.   On: 01/18/2022 22:03     Consults:  Case discussed with neurosurgery on-call.   Final Assessment and Plan:   I was called with a critical result of intracranial hemorrhage.  They recommended proceeding to CT angiography due to multifocal intracranial hemorrhage.  Likely etiology of patient's headache. Patient has a history of allergy to contrast per bedside friend.  She does  not remember the context. Started patient on Cardene drip due to gross  hypertensin, CT angiography ordered and neurosurgery was consulted.  They recommended admission to main campus for further care and management.  They do not feel the need to continue with CT angiography.  Disposition:   Based on the above findings, I believe this patient is stable for admission.    Patient/family educated about specific findings on our evaluation and explained exact reasons for admission.  Patient/family educated about clinical situation and time was allowed to answer questions.   Admission team communicated with and agreed with need for admission. Patient admitted. Patient  ready to move at this time.     Emergency Department Medication Summary:   Medications  ondansetron (ZOFRAN-ODT) disintegrating tablet 8 mg (8 mg Oral Not Given 01/18/22 2138)  diphenhydrAMINE (BENADRYL) capsule 50 mg (has no administration in time range)    Or  diphenhydrAMINE (BENADRYL) injection 50 mg (has no administration in time range)  nicardipine (CARDENE) 20mg  in 0.86% saline IV infusion (0.1 mg/ml) (5 mg/hr Intravenous New Bag/Given 01/18/22 2250)  morphine (PF) 4 MG/ML injection 4 mg (has no administration in time range)  lactated ringers bolus 1,000 mL (0 mLs Intravenous Stopped 01/18/22 2227)  ondansetron (ZOFRAN) injection 4 mg (4 mg Intravenous Given 01/18/22 2137)  morphine (PF) 4 MG/ML injection 4 mg (4 mg Intravenous Given 01/18/22 2137)  methylPREDNISolone sodium succinate (SOLU-MEDROL) 40 mg/mL injection 40 mg (40 mg Intravenous Given 01/18/22 2246)           Clinical Impression:  1. Nausea and vomiting, unspecified vomiting type      Admit   Final Clinical Impression(s) / ED Diagnoses Final diagnoses:  Nausea and vomiting, unspecified vomiting type    Rx / DC Orders ED Discharge Orders     None         03/19/22, MD 01/18/22 2327

## 2022-01-19 DIAGNOSIS — S066X1A Traumatic subarachnoid hemorrhage with loss of consciousness of 30 minutes or less, initial encounter: Secondary | ICD-10-CM | POA: Diagnosis present

## 2022-01-19 MED ORDER — ONDANSETRON HCL 4 MG/2ML IJ SOLN
4.0000 mg | Freq: Four times a day (QID) | INTRAMUSCULAR | Status: DC | PRN
  Administered 2022-01-19 – 2022-01-28 (×12): 4 mg via INTRAVENOUS
  Filled 2022-01-19 (×14): qty 2

## 2022-01-19 MED ORDER — TRAZODONE HCL 50 MG PO TABS
50.0000 mg | ORAL_TABLET | Freq: Every day | ORAL | Status: DC
  Administered 2022-01-21 – 2022-01-27 (×7): 50 mg via ORAL
  Filled 2022-01-19 (×8): qty 1

## 2022-01-19 MED ORDER — ACETAMINOPHEN 650 MG RE SUPP: 650.0000 mg | Freq: Four times a day (QID) | RECTAL | Status: DC | PRN

## 2022-01-19 MED ORDER — HYDROCODONE-ACETAMINOPHEN 5-325 MG PO TABS
1.0000 | ORAL_TABLET | ORAL | Status: DC | PRN
  Administered 2022-01-19: 2 via ORAL
  Administered 2022-01-20: 1 via ORAL
  Administered 2022-01-20: 2 via ORAL
  Administered 2022-01-21 (×2): 1 via ORAL
  Administered 2022-01-22 (×2): 2 via ORAL
  Administered 2022-01-24 (×2): 1 via ORAL
  Filled 2022-01-19: qty 1
  Filled 2022-01-19 (×3): qty 2
  Filled 2022-01-19 (×2): qty 1
  Filled 2022-01-19 (×3): qty 2
  Filled 2022-01-19: qty 1

## 2022-01-19 MED ORDER — DEXTROSE-NACL 5-0.9 % IV SOLN: INTRAVENOUS | Status: DC

## 2022-01-19 MED ORDER — EZETIMIBE 10 MG PO TABS
10.0000 mg | ORAL_TABLET | Freq: Every day | ORAL | Status: DC
  Administered 2022-01-19 – 2022-01-28 (×9): 10 mg via ORAL
  Filled 2022-01-19 (×9): qty 1

## 2022-01-19 MED ORDER — METOPROLOL TARTRATE 25 MG PO TABS
25.0000 mg | ORAL_TABLET | Freq: Two times a day (BID) | ORAL | Status: DC
  Administered 2022-01-19 – 2022-01-28 (×16): 25 mg via ORAL
  Filled 2022-01-19 (×18): qty 1

## 2022-01-19 MED ORDER — AMLODIPINE BESYLATE 5 MG PO TABS
5.0000 mg | ORAL_TABLET | Freq: Every day | ORAL | Status: DC
  Administered 2022-01-19 – 2022-01-24 (×6): 5 mg via ORAL
  Filled 2022-01-19 (×6): qty 1

## 2022-01-19 MED ORDER — FAMOTIDINE 20 MG PO TABS
20.0000 mg | ORAL_TABLET | Freq: Two times a day (BID) | ORAL | Status: DC
  Administered 2022-01-19 – 2022-01-28 (×16): 20 mg via ORAL
  Filled 2022-01-19 (×17): qty 1

## 2022-01-19 MED ORDER — LORATADINE 10 MG PO TABS
10.0000 mg | ORAL_TABLET | Freq: Every day | ORAL | Status: DC
  Administered 2022-01-19 – 2022-01-28 (×9): 10 mg via ORAL
  Filled 2022-01-19 (×9): qty 1

## 2022-01-19 MED ORDER — ROSUVASTATIN CALCIUM 20 MG PO TABS
40.0000 mg | ORAL_TABLET | Freq: Every day | ORAL | Status: DC
  Administered 2022-01-19 – 2022-01-28 (×9): 40 mg via ORAL
  Filled 2022-01-19 (×9): qty 2

## 2022-01-19 MED ORDER — ONDANSETRON HCL 4 MG PO TABS
4.0000 mg | ORAL_TABLET | Freq: Four times a day (QID) | ORAL | Status: DC | PRN
  Administered 2022-01-21: 4 mg via ORAL

## 2022-01-19 MED ORDER — SPIRONOLACTONE 25 MG PO TABS
25.0000 mg | ORAL_TABLET | Freq: Every day | ORAL | Status: DC
  Administered 2022-01-19 – 2022-01-26 (×7): 25 mg via ORAL
  Filled 2022-01-19 (×8): qty 1

## 2022-01-19 MED ORDER — GABAPENTIN 100 MG PO CAPS
100.0000 mg | ORAL_CAPSULE | Freq: Every day | ORAL | Status: DC | PRN
  Administered 2022-01-25: 100 mg via ORAL
  Filled 2022-01-19: qty 1

## 2022-01-19 MED ORDER — HYDROCHLOROTHIAZIDE 25 MG PO TABS
25.0000 mg | ORAL_TABLET | Freq: Every day | ORAL | Status: DC
  Administered 2022-01-19 – 2022-01-26 (×7): 25 mg via ORAL
  Filled 2022-01-19: qty 1
  Filled 2022-01-19: qty 2
  Filled 2022-01-19 (×2): qty 1
  Filled 2022-01-19: qty 2
  Filled 2022-01-19 (×3): qty 1

## 2022-01-19 MED ORDER — CARBAMAZEPINE 200 MG PO TABS
400.0000 mg | ORAL_TABLET | Freq: Two times a day (BID) | ORAL | Status: DC
  Administered 2022-01-19 – 2022-01-28 (×16): 400 mg via ORAL
  Filled 2022-01-19 (×18): qty 2

## 2022-01-19 MED ORDER — HYDROMORPHONE HCL 1 MG/ML IJ SOLN
0.5000 mg | INTRAMUSCULAR | Status: DC | PRN
  Administered 2022-01-19: 0.5 mg via INTRAVENOUS
  Administered 2022-01-19 – 2022-01-28 (×17): 1 mg via INTRAVENOUS
  Filled 2022-01-19 (×18): qty 1

## 2022-01-19 MED ORDER — PANTOPRAZOLE SODIUM 40 MG PO TBEC
40.0000 mg | DELAYED_RELEASE_TABLET | Freq: Every day | ORAL | Status: DC
  Administered 2022-01-19 – 2022-01-22 (×4): 40 mg via ORAL
  Filled 2022-01-19 (×4): qty 1

## 2022-01-19 MED ORDER — SENNOSIDES-DOCUSATE SODIUM 8.6-50 MG PO TABS: 1.0000 | ORAL_TABLET | Freq: Every evening | ORAL | Status: DC | PRN

## 2022-01-19 MED ORDER — PROMETHAZINE HCL 12.5 MG RE SUPP
12.5000 mg | Freq: Four times a day (QID) | RECTAL | Status: DC | PRN
  Administered 2022-01-19 – 2022-01-23 (×3): 12.5 mg via RECTAL
  Filled 2022-01-19 (×7): qty 1

## 2022-01-19 MED ORDER — ONDANSETRON HCL 4 MG/2ML IJ SOLN
4.0000 mg | Freq: Once | INTRAMUSCULAR | Status: AC
  Administered 2022-01-19: 4 mg via INTRAVENOUS
  Filled 2022-01-19: qty 2

## 2022-01-19 MED ORDER — ACETAMINOPHEN 325 MG PO TABS
650.0000 mg | ORAL_TABLET | Freq: Four times a day (QID) | ORAL | Status: DC | PRN
  Administered 2022-01-25 – 2022-01-27 (×3): 650 mg via ORAL
  Filled 2022-01-19 (×3): qty 2

## 2022-01-19 NOTE — H&P (Addendum)
Karen Saunders is an 67 y.o. female.   Chief Complaint: Traumatic subarachnoid hemorrhage HPI: 67 year old female status post multiple falls.  Patient with history of diarrheal illness with probable dehydration who suffered a number of falls at home.  Patient has developed severe headache with some associated nausea and vomiting.  She is having no speech or language difficulties.  She is having no seizures.  She is having no numbness paresthesias or weakness.  She complains of severe headache.  She denies other areas of pain.  Specifically she denies neck pain chest or abdominal pain.  Past Medical History:  Diagnosis Date   Anemia    with prev unremarkable heme eval ~2011   CAD (coronary artery disease)    Dr. Wynonia Lawman with Cards   History of gastric ulcer    2005    Hyperlipidemia    Hypertension    IBS (irritable bowel syndrome)    Leukopenia    with prev unremarkable heme eval ~2011   Migraine    Unsp w/o intract w/o status migrainosus   Osteoporosis 11/18/2004   Shingles    Trigeminal neuralgia 03/05/2012    Past Surgical History:  Procedure Laterality Date   ABDOMINAL HYSTERECTOMY  2002   CORONARY ARTERY BYPASS GRAFT  2002   LUMBAR SPINE SURGERY     Stress cardiolite  09/11/2008   Probably normal with breast attenuation noted but no evidence of ischemia   TONSILLECTOMY  ~ 1965    Family History  Problem Relation Age of Onset   Arthritis Mother    Diabetes Mother    Hypertension Mother    Dementia Mother    Stroke Father    Heart disease Father        MI   Diabetes Father    Hypertension Father    Colon cancer Neg Hx    Breast cancer Neg Hx    Social History:  reports that she has never smoked. She has never used smokeless tobacco. She reports that she does not drink alcohol and does not use drugs.  Allergies:  Allergies  Allergen Reactions   Amoxicillin-Pot Clavulanate     REACTION: Rash on tongue   Azithromycin Other (See Comments)    diarrhea   Codeine  Nausea Only   Delsym [Dextromethorphan]     GI upset.   Doxycycline     GI intolerance   Ibandronate Sodium     REACTION: GI side effects   Iohexol      Desc: HIVES,NASAL CONGESTION DURING A CARDIAC CATH. REQUIRES PRE-MEDS.    Raloxifene     REACTION: GI upset   Sulfonamide Derivatives     REACTION: itching   Zoledronic Acid     REACTION: unable to take this as she was intolerant of pre-tx calcium and vitamin D   Iodinated Contrast Media Rash    (Not in a hospital admission)   Results for orders placed or performed during the hospital encounter of 01/18/22 (from the past 48 hour(s))  Resp panel by RT-PCR (RSV, Flu A&B, Covid) Anterior Nasal Swab     Status: None   Collection Time: 01/18/22  7:12 PM   Specimen: Anterior Nasal Swab  Result Value Ref Range   SARS Coronavirus 2 by RT PCR NEGATIVE NEGATIVE    Comment: (NOTE) SARS-CoV-2 target nucleic acids are NOT DETECTED.  The SARS-CoV-2 RNA is generally detectable in upper respiratory specimens during the acute phase of infection. The lowest concentration of SARS-CoV-2 viral copies this assay can detect  is 138 copies/mL. A negative result does not preclude SARS-Cov-2 infection and should not be used as the sole basis for treatment or other patient management decisions. A negative result may occur with  improper specimen collection/handling, submission of specimen other than nasopharyngeal swab, presence of viral mutation(s) within the areas targeted by this assay, and inadequate number of viral copies(<138 copies/mL). A negative result must be combined with clinical observations, patient history, and epidemiological information. The expected result is Negative.  Fact Sheet for Patients:  BloggerCourse.com  Fact Sheet for Healthcare Providers:  SeriousBroker.it  This test is no t yet approved or cleared by the [place] FDA and  has been authorized for detection  and/or diagnosis of SARS-CoV-2 by FDA under an Emergency Use Authorization (EUA). This EUA will remain  in effect (meaning this test can be used) for the duration of the COVID-19 declaration under Section 564(b)(1) of the Act, 21 U.S.C.section 360bbb-3(b)(1), unless the authorization is terminated  or revoked sooner.       Influenza A by PCR NEGATIVE NEGATIVE   Influenza B by PCR NEGATIVE NEGATIVE    Comment: (NOTE) The Xpert Xpress SARS-CoV-2/FLU/RSV plus assay is intended as an aid in the diagnosis of influenza from Nasopharyngeal swab specimens and should not be used as a sole basis for treatment. Nasal washings and aspirates are unacceptable for Xpert Xpress SARS-CoV-2/FLU/RSV testing.  Fact Sheet for Patients: BloggerCourse.com  Fact Sheet for Healthcare Providers: SeriousBroker.it  This test is not yet approved or cleared by the [place] FDA and has been authorized for detection and/or diagnosis of SARS-CoV-2 by FDA under an Emergency Use Authorization (EUA). This EUA will remain in effect (meaning this test can be used) for the duration of the COVID-19 declaration under Section 564(b)(1) of the Act, 21 U.S.C. section 360bbb-3(b)(1), unless the authorization is terminated or revoked.     Resp Syncytial Virus by PCR NEGATIVE NEGATIVE    Comment: (NOTE) Fact Sheet for Patients: BloggerCourse.com  Fact Sheet for Healthcare Providers: SeriousBroker.it  This test is not yet approved or cleared by the [place] FDA and has been authorized for detection and/or diagnosis of SARS-CoV-2 by FDA under an Emergency Use Authorization (EUA). This EUA will remain in effect (meaning this test can be used) for the duration of the COVID-19 declaration under Section 564(b)(1) of the Act, 21 U.S.C. section 360bbb-3(b)(1), unless the authorization is terminated  or revoked.  Performed at [facility], [address], [place], [place] [address]   POC CBG, ED     Status: Abnormal   Collection Time: 01/18/22  7:18 PM  Result Value Ref Range   Glucose-Capillary 131 (H) 70 - 99 mg/dL    Comment: Glucose reference range applies only to samples taken after fasting for at least 8 hours.  Comprehensive metabolic panel     Status: Abnormal   Collection Time: 01/18/22  7:28 PM  Result Value Ref Range   Sodium 129 (L) 135 - 145 mmol/L   Potassium 3.9 3.5 - 5.1 mmol/L   Chloride 91 (L) 98 - 111 mmol/L   CO2 25 22 - 32 mmol/L   Glucose, Bld 131 (H) 70 - 99 mg/dL    Comment: Glucose reference range applies only to samples taken after fasting for at least 8 hours.   BUN 17 8 - 23 mg/dL   Creatinine, Ser 6.60 0.44 - 1.00 mg/dL   Calcium 9.2 8.9 - 63.0 mg/dL   Total Protein 7.1 6.5 - 8.1 g/dL  Albumin 4.1 3.5 - 5.0 g/dL   AST 40 15 - 41 U/L   ALT 26 0 - 44 U/L   Alkaline Phosphatase 71 38 - 126 U/L   Total Bilirubin 0.4 0.3 - 1.2 mg/dL   GFR, Estimated >61 >44 mL/min    Comment: (NOTE) Calculated using the CKD-EPI Creatinine Equation (2021)    Anion gap 13 5 - 15    Comment: Performed at [facility], [address] [place], [place] [address]  CBC with Differential     Status: Abnormal   Collection Time: 01/18/22  7:28 PM  Result Value Ref Range   WBC 5.2 4.0 - 10.5 K/uL   RBC 3.54 (L) 3.87 - 5.11 MIL/uL   Hemoglobin 11.1 (L) 12.0 - 15.0 g/dL   HCT 08.6 (L) 76.1 - 95.0 %   MCV 88.7 80.0 - 100.0 fL   MCH 31.4 26.0 - 34.0 pg   MCHC 35.4 30.0 - 36.0 g/dL   RDW 93.2 67.1 - 24.5 %   Platelets 133 (L) 150 - 400 K/uL    Comment: SPECIMEN CHECKED FOR CLOTS REPEATED TO VERIFY    nRBC 0.0 0.0 - 0.2 %   Neutrophils Relative % 90 %   Neutro Abs 4.7 1.7 - 7.7 K/uL   Lymphocytes Relative 6 %   Lymphs Abs 0.3 (L) 0.7 - 4.0 K/uL   Monocytes Relative 4 %   Monocytes Absolute 0.2 0.1 - 1.0 K/uL   Eosinophils  Relative 0 %   Eosinophils Absolute 0.0 0.0 - 0.5 K/uL   Basophils Relative 0 %   Basophils Absolute 0.0 0.0 - 0.1 K/uL   Immature Granulocytes 0 %   Abs Immature Granulocytes 0.02 0.00 - 0.07 K/uL    Comment: Performed at [facility], [address], [place], [place] [address]  Lipase, blood     Status: None   Collection Time: 01/18/22  7:28 PM  Result Value Ref Range   Lipase 38 11 - 51 U/L    Comment: Performed at [facility], [address], [place], [place] [address]  Protime-INR     Status: None   Collection Time: 01/18/22 11:01 PM  Result Value Ref Range   Prothrombin Time 13.4 11.4 - 15.2 seconds   INR 1.0 0.8 - 1.2    Comment: (NOTE) INR goal varies based on device and disease states. Performed at [facility], [address] [place], [place] [address]    CT HEAD WO CONTRAST ( )  Result Date: 01/18/2022 CLINICAL DATA:  Initial evaluation for acute headache, nausea, vomiting. EXAM: CT HEAD WITHOUT CONTRAST TECHNIQUE: Contiguous axial images were obtained from the base of the skull through the vertex without intravenous contrast. RADIATION DOSE REDUCTION: This exam was performed according to the departmental dose-optimization program which includes automated exposure control, adjustment of the mA and/or kV according to patient size and/or use of iterative reconstruction technique. COMPARISON:  None Available. FINDINGS: Brain: Cerebral volume within normal limits. Mild chronic microvascular ischemic disease. Scattered acute subarachnoid hemorrhage seen involving the bilateral cerebral hemispheres, most pronounced at the frontal lobes near the vertex. Subarachnoid blood seen within the left sylvian fissure. Possible small extra-axial component overlies the left frontotemporal convexity, measuring up to 2 mm in thickness (series 4, image 30). Trace acute subdural hemorrhage seen along the right tentorium, measuring 3 mm in  maximal thickness without mass effect. No intraparenchymal or intraventricular hemorrhage. No acute large vessel territory infarct. No mass lesion  or midline shift. No hydrocephalus. Vascular: No abnormal hyperdense vessel. Scattered vascular calcifications noted within the carotid siphons. Skull: Scalp soft tissues demonstrate no acute finding. Calvarium intact. Sinuses/Orbits: Globes orbital soft tissues within normal limits. Paranasal sinuses and mastoid air cells are clear. Other: None. IMPRESSION: 1. Scattered acute subarachnoid hemorrhage involving the bilateral cerebral hemispheres, most pronounced at the frontal lobes near the vertex. 2. Trace acute subdural hemorrhage along the right tentorium, measuring 3 mm in maximal thickness. Additional possible trace extra-axial component overlying the left cerebral convexity. No mass effect. 3. Underlying mild chronic microvascular ischemic disease. Critical Value/emergent results were called by telephone at the time of interpretation on 01/18/2022 at 10:30 pm to provider [facility] , who verbally acknowledged these results. Electronically Signed   By: Jeannine Boga M.D.   On: 01/18/2022 22:33   CT ABDOMEN PELVIS WO CONTRAST  Result Date: 01/18/2022 CLINICAL DATA:  Abdominal pain, acute, nonlocalized EXAM: CT ABDOMEN AND PELVIS WITHOUT CONTRAST TECHNIQUE: Multidetector CT imaging of the abdomen and pelvis was performed following the standard protocol without IV contrast. RADIATION DOSE REDUCTION: This exam was performed according to the departmental dose-optimization program which includes automated exposure control, adjustment of the mA and/or kV according to patient size and/or use of iterative reconstruction technique. COMPARISON:  05/07/2021 FINDINGS: Lower chest: Median sternotomy has been performed. Coronary artery calcifications noted. Global cardiac size within normal limits. Visualized lung bases are clear. Hepatobiliary: No focal liver  abnormality is seen. No gallstones, gallbladder wall thickening, or biliary dilatation. Pancreas: Unremarkable Spleen: Unremarkable Adrenals/Urinary Tract: The adrenal glands are unremarkable. The kidneys are normal in size and position. Vascular calcifications are noted within the renal hila bilaterally, right greater than left. No urinary renal or ureteral calculi. No hydronephrosis. No perinephric inflammatory stranding or fluid collections are seen. The bladder is unremarkable. Stomach/Bowel: Mild sigmoid diverticulosis without superimposed acute inflammatory change. The stomach, small bowel, and large bowel are otherwise unremarkable. Appendix normal. No free intraperitoneal gas or fluid. Vascular/Lymphatic: Mild aortoiliac atherosclerotic calcification. No aortic aneurysm. No pathologic adenopathy within the abdomen and pelvis. Reproductive: Status post hysterectomy. No adnexal masses. Other: Small fat containing umbilical hernia. Musculoskeletal: Osseous structures are age-appropriate. No acute bone abnormality. No lytic or blastic bone lesion. IMPRESSION: 1. No acute intra-abdominal pathology identified. No definite radiographic explanation for the patient's reported symptoms. 2. Coronary artery calcifications. 3. Mild sigmoid diverticulosis without superimposed acute inflammatory change. Aortic Atherosclerosis (ICD10-I70.0). Electronically Signed   By: Fidela Salisbury M.D.   On: 01/18/2022 22:03    Pertinent items noted in HPI and remainder of comprehensive ROS otherwise negative.  Blood pressure 127/84, pulse 77, temperature 98.6 F (37 C), temperature source Oral, resp. rate (!) 24, SpO2 98 %.  Patient is awake and alert.  She is oriented and appropriate.  She is obviously quite uncomfortable.  Examination of her head ears eyes nose and throat demonstrates some mild tenderness around the posterior aspects of her scalp without evidence of laceration or significant bony abnormality.  Oropharynx  nasopharynx and external auditory canals are clear.  Neck is supple.  Cervical spine nontender.  Airway midline.  Pulses normal.  Chest is atraumatic.  Breath sounds are equal bilaterally.  Abdomen soft.  Extremities are free from injury or deformity.  Cranial nerve function normal bilaterally.  Motor examination 5/5 bilaterally.  No pronator drift.  Sensory examination nonfocal.  Reflexes normal. Assessment/Plan Status post fall with traumatic subarachnoid hemorrhage.  Plan hospital admission for hydration and observation.  Work at  pain control.  Karen Saunders 01/19/2022, 8:49 AM

## 2022-01-20 DIAGNOSIS — I608 Other nontraumatic subarachnoid hemorrhage: Secondary | ICD-10-CM | POA: Diagnosis present

## 2022-01-20 DIAGNOSIS — I609 Nontraumatic subarachnoid hemorrhage, unspecified: Secondary | ICD-10-CM | POA: Insufficient documentation

## 2022-01-20 LAB — HIV ANTIBODY (ROUTINE TESTING W REFLEX): HIV Screen 4th Generation wRfx: NONREACTIVE

## 2022-01-20 LAB — BASIC METABOLIC PANEL
Anion gap: 7 (ref 5–15)
BUN: 12 mg/dL (ref 8–23)
CO2: 26 mmol/L (ref 22–32)
Calcium: 8.7 mg/dL — ABNORMAL LOW (ref 8.9–10.3)
Chloride: 106 mmol/L (ref 98–111)
Creatinine, Ser: 0.91 mg/dL (ref 0.44–1.00)
GFR, Estimated: >60 mL/min (ref >60)
Glucose, Bld: 107 mg/dL — ABNORMAL HIGH (ref 70–99)
Potassium: 3.8 mmol/L (ref 3.5–5.1)
Sodium: 139 mmol/L (ref 135–145)

## 2022-01-20 LAB — CBC
HCT: 27.7 % — ABNORMAL LOW (ref 36.0–46.0)
HCT: 26.1 % — ABNORMAL LOW (ref 36.0–46.0)
Hemoglobin: 9.5 g/dL — ABNORMAL LOW (ref 12.0–15.0)
Hemoglobin: 8.9 g/dL — ABNORMAL LOW (ref 12.0–15.0)
MCH: 31.1 pg (ref 26.0–34.0)
MCH: 31.6 pg (ref 26.0–34.0)
MCHC: 34.3 g/dL (ref 30.0–36.0)
MCHC: 34.1 g/dL (ref 30.0–36.0)
MCV: 90.8 fL (ref 80.0–100.0)
MCV: 92.6 fL (ref 80.0–100.0)
Platelets: 103 10*3/uL — ABNORMAL LOW (ref 150–400)
Platelets: 96 10*3/uL — ABNORMAL LOW (ref 150–400)
RBC: 3.05 MIL/uL — ABNORMAL LOW (ref 3.87–5.11)
RBC: 2.82 MIL/uL — ABNORMAL LOW (ref 3.87–5.11)
RDW: 12.2 % (ref 11.5–15.5)
RDW: 12.3 % (ref 11.5–15.5)
WBC: 1.8 10*3/uL — ABNORMAL LOW (ref 4.0–10.5)
WBC: 1.5 10*3/uL — ABNORMAL LOW (ref 4.0–10.5)
nRBC: 0 % (ref 0.0–0.2)
nRBC: 0 % (ref 0.0–0.2)

## 2022-01-20 NOTE — ED Notes (Signed)
Pt. Was asking for me to come into the room, once in the room the patient had complaints of shaking in her left arm. Upon assessment the patient was shaking bilaterally. No weakness noted at this time. I spoke with Dr. Leonette Monarch to have the pt assessed as she is awaiting admission to [facility]

## 2022-01-20 NOTE — Progress Notes (Signed)
Providing Compassionate, Quality Care - Together   Subjective: Patient reports dull headache still. Nausea is improving.  Objective: Vital signs in last 24 hours: Temp:  [97.6 F (36.4 C)-98.4 F (36.9 C)] 98.2 F (36.8 C) (01/11 0714) Pulse Rate:  [51-93] 51 (01/11 0830) Resp:  [10-24] 18 (01/11 0830) BP: (129-189)/(70-103) 163/89 (01/11 0830) SpO2:  [93 %-100 %] 98 % (01/11 0830) Weight:  [49 kg] 49 kg (01/10 2131)  Intake/Output from previous day: 01/10 0701 - 01/11 0700 In: 579.3 [I.V.:579.3] Out: -  Intake/Output this shift: No intake/output data recorded.  Alert and oriented x 4 PERRLA, EOMI Speech clear CN II-XII grossly intact MAE, Strength and sensation intact    Lab Results: Recent Labs    01/18/22 1928 01/20/22 0557  WBC 5.2 1.8*  HGB 11.1* 9.5*  HCT 31.4* 27.7*  PLT 133* 103*   BMET Recent Labs    01/18/22 1928 01/20/22 0557  NA 129* 139  K 3.9 3.8  CL 91* 106  CO2 25 26  GLUCOSE 131* 107*  BUN 17 12  CREATININE 0.91 0.91  CALCIUM 9.2 8.7*    Studies/Results: CT HEAD WO CONTRAST (5MM)  Result Date: 01/18/2022 CLINICAL DATA:  Initial evaluation for acute headache, nausea, vomiting. EXAM: CT HEAD WITHOUT CONTRAST TECHNIQUE: Contiguous axial images were obtained from the base of the skull through the vertex without intravenous contrast. RADIATION DOSE REDUCTION: This exam was performed according to the departmental dose-optimization program which includes automated exposure control, adjustment of the mA and/or kV according to patient size and/or use of iterative reconstruction technique. COMPARISON:  None Available. FINDINGS: Brain: Cerebral volume within normal limits. Mild chronic microvascular ischemic disease. Scattered acute subarachnoid hemorrhage seen involving the bilateral cerebral hemispheres, most pronounced at the frontal lobes near the vertex. Subarachnoid blood seen within the left sylvian fissure. Possible small extra-axial  component overlies the left frontotemporal convexity, measuring up to 2 mm in thickness (series 4, image 30). Trace acute subdural hemorrhage seen along the right tentorium, measuring 3 mm in maximal thickness without mass effect. No intraparenchymal or intraventricular hemorrhage. No acute large vessel territory infarct. No mass lesion or midline shift. No hydrocephalus. Vascular: No abnormal hyperdense vessel. Scattered vascular calcifications noted within the carotid siphons. Skull: Scalp soft tissues demonstrate no acute finding. Calvarium intact. Sinuses/Orbits: Globes orbital soft tissues within normal limits. Paranasal sinuses and mastoid air cells are clear. Other: None. IMPRESSION: 1. Scattered acute subarachnoid hemorrhage involving the bilateral cerebral hemispheres, most pronounced at the frontal lobes near the vertex. 2. Trace acute subdural hemorrhage along the right tentorium, measuring 3 mm in maximal thickness. Additional possible trace extra-axial component overlying the left cerebral convexity. No mass effect. 3. Underlying mild chronic microvascular ischemic disease. Critical Value/emergent results were called by telephone at the time of interpretation on 01/18/2022 at 10:30 pm to provider [facility] , who verbally acknowledged these results. Electronically Signed   By: Jeannine Boga M.D.   On: 01/18/2022 22:33   CT ABDOMEN PELVIS WO CONTRAST  Result Date: 01/18/2022 CLINICAL DATA:  Abdominal pain, acute, nonlocalized EXAM: CT ABDOMEN AND PELVIS WITHOUT CONTRAST TECHNIQUE: Multidetector CT imaging of the abdomen and pelvis was performed following the standard protocol without IV contrast. RADIATION DOSE REDUCTION: This exam was performed according to the departmental dose-optimization program which includes automated exposure control, adjustment of the mA and/or kV according to patient size and/or use of iterative reconstruction technique. COMPARISON:  05/07/2021 FINDINGS: Lower  chest: Median sternotomy has been performed. Coronary  artery calcifications noted. Global cardiac size within normal limits. Visualized lung bases are clear. Hepatobiliary: No focal liver abnormality is seen. No gallstones, gallbladder wall thickening, or biliary dilatation. Pancreas: Unremarkable Spleen: Unremarkable Adrenals/Urinary Tract: The adrenal glands are unremarkable. The kidneys are normal in size and position. Vascular calcifications are noted within the renal hila bilaterally, right greater than left. No urinary renal or ureteral calculi. No hydronephrosis. No perinephric inflammatory stranding or fluid collections are seen. The bladder is unremarkable. Stomach/Bowel: Mild sigmoid diverticulosis without superimposed acute inflammatory change. The stomach, small bowel, and large bowel are otherwise unremarkable. Appendix normal. No free intraperitoneal gas or fluid. Vascular/Lymphatic: Mild aortoiliac atherosclerotic calcification. No aortic aneurysm. No pathologic adenopathy within the abdomen and pelvis. Reproductive: Status post hysterectomy. No adnexal masses. Other: Small fat containing umbilical hernia. Musculoskeletal: Osseous structures are age-appropriate. No acute bone abnormality. No lytic or blastic bone lesion. IMPRESSION: 1. No acute intra-abdominal pathology identified. No definite radiographic explanation for the patient's reported symptoms. 2. Coronary artery calcifications. 3. Mild sigmoid diverticulosis without superimposed acute inflammatory change. Aortic Atherosclerosis (ICD10-I70.0). Electronically Signed   By: Fidela Salisbury M.D.   On: 01/18/2022 22:03    Assessment/Plan: Patient with traumatic subarachnoid hemorrhage. She would like to work with PT/OT before going home. Will hopefully discharge home tomorrow.   LOS: 2 days   -PT/OT ordered -Will order a follow up CBC to verify WBC (though has been neutropenic in the past).    Viona Gilmore, DNP, AGNP-C Nurse  Practitioner  [facility] [address], [place], [place] [address] P: [phone]    F: [phone]  01/20/2022, 11:08 AM

## 2022-01-20 NOTE — ED Notes (Signed)
This RN has spoke with Dr. Ellene Route who will be changing the level of care to ICU due to the patient needing the Cardene drip.

## 2022-01-20 NOTE — ED Notes (Signed)
Pt. States that she feels much better now

## 2022-01-20 NOTE — ED Notes (Signed)
Pt is reporting nausea - will HOLD all PO meds at this time. Phenergan suppository given as ordered.

## 2022-01-20 NOTE — ED Notes (Signed)
Pt tolerating crackers with no nausea or vomiting

## 2022-01-20 NOTE — ED Notes (Signed)
Pt continues to report nausea. This RN has called pharmacy to please send Phenergan suppository.

## 2022-01-20 NOTE — ED Notes (Signed)
Neurosurgery paged

## 2022-01-20 NOTE — ED Notes (Signed)
The dinner tray was delivered and the patient attempted to eat her dinner. She became nauseous and complained of headache. PRN Zofran and Dilaudid given as ordered.

## 2022-01-20 NOTE — ED Notes (Signed)
[facility] HAS BEEN CALLED

## 2022-01-21 DIAGNOSIS — I1A0 Resistant hypertension: Secondary | ICD-10-CM | POA: Diagnosis present

## 2022-01-21 LAB — TECHNOLOGIST SMEAR REVIEW: Plt Morphology: NORMAL

## 2022-01-21 LAB — MRSA NEXT GEN BY PCR, NASAL: MRSA by PCR Next Gen: NOT DETECTED

## 2022-01-21 MED ORDER — SODIUM CHLORIDE 0.9 % IV SOLN: INTRAVENOUS | Status: DC

## 2022-01-21 MED ORDER — CHLORHEXIDINE GLUCONATE CLOTH 2 % EX PADS
6.0000 | MEDICATED_PAD | Freq: Every day | CUTANEOUS | Status: DC
  Administered 2022-01-21 – 2022-01-26 (×5): 6 via TOPICAL

## 2022-01-21 MED ORDER — ORAL CARE MOUTH RINSE: 15.0000 mL | OROMUCOSAL | Status: DC | PRN

## 2022-01-21 NOTE — Progress Notes (Signed)
  Transition of Care [facility]) Screening Note   Patient Details  Name: Karen Saunders Date of Birth: December 12, 1955   Transition of Care [facility]) CM/SW Contact:    Benard Halsted, LCSW Phone Number: 01/21/2022, 9:56 AM    Transition of Care Department [facility]) has reviewed patient. We will continue to monitor patient advancement through interdisciplinary progression rounds. If new patient transition needs arise, please place a TOC consult.

## 2022-01-21 NOTE — Consult Note (Signed)
NAME:  Karen Saunders, MRN:  [id number], DOB:  11/08/1955, LOS: 3 ADMISSION DATE:  01/18/2022, CONSULTATION DATE: 01/21/2022 REFERRING MD: Neurosurgery, CHIEF COMPLAINT: Hypertension in the setting of subarachnoid hemorrhage  History of Present Illness:  67 year old female who presented with several days of nausea vomiting diarrhea and multiple falls while trying to get to the bathroom.  She was evaluated [facility] emergency department found to have a diffuse subarachnoid hemorrhage that did not require invasive interventions.  She was offered p.o. antihypertensives for several days and started on a Cardene drip for systolic blood pressure greater than 160.  She transferred to [facility] to the neurosurgical ICU unit and pulmonary critical care was asked to evaluate for hypertensive.  She has resumed her oral antihypertensive and her Cardene drip is weaning to off at this time.  Pulmonary critical care will continue to follow-up until she is off the Cardene drip.  Pertinent  Medical History   Past Medical History:  Diagnosis Date   Anemia    with prev unremarkable heme eval ~2011   CAD (coronary artery disease)    Dr. Wynonia Lawman with Cards   History of gastric ulcer    2005    Hyperlipidemia    Hypertension    IBS (irritable bowel syndrome)    Leukopenia    with prev unremarkable heme eval ~2011   Migraine    Unsp w/o intract w/o status migrainosus   Osteoporosis 11/18/2004   Shingles    Trigeminal neuralgia 03/05/2012     Significant Hospital Events: Including procedures, antibiotic start and stop dates in addition to other pertinent events   01/21/2022 critical care consult  Interim History / Subjective:  Currently on Cardene drip for hypertension but has been off p.o. medications for multiple days.  Objective   Blood pressure (!) 151/91, pulse 66, temperature 98.2 F (36.8 C), temperature source Oral, resp. rate 15, height 5\' 2"  (1.575 m), weight 49.7 kg, SpO2 95  %.        Intake/Output Summary (Last 24 hours) at 01/21/2022 1032 Last data filed at 01/21/2022 0900 Gross per 24 hour  Intake 4747.58 ml  Output 900 ml  Net 3847.58 ml   Filed Weights   01/19/22 2131 01/21/22 0108  Weight: 49 kg 49.7 kg    Examination: General: Well-nourished well-developed female HENT: No JVD or lymphadenopathy is appreciated Lungs: Decreased breath sounds at the bases Cardiovascular: Heart sounds are regular Abdomen: Soft nontender positive bowel sounds Extremities: Warm without edema Neuro: Grossly intact without focal defect GU: Voids  Resolved Hospital Problem list     Assessment & Plan:  Hypertension requiring Cardene drip.  Patient is has known hypertension on multiple p.o. medications for hypertension but has had nausea and vomiting and has been p.o. medications for approximately 72 hours with the institution of Cardene drip to keep her systolic blood pressure lower than 160 in the setting of a subarachnoid hemorrhage from fall.  Resume oral antihypertensives Wean Cardene drip off Treat nausea with Zofran Continue to monitor in the ICU for 24 hours  Diffuse subarachnoid hemorrhage that does not require surgical intervention Per neurosurgery  Chronic anemia and thrombocytopenia Recent Labs    01/20/22 0557 01/20/22 1313  HGB 9.5* 8.9*  Plts 103  Monitor  Hyponatremia  Recent Labs  Lab 01/18/22 1928 01/20/22 0557  NA 129* 139    History of COVID-19 in December 2023 COVID negative this admission      Best Practice (right click and "  Reselect all SmartList Selections" daily)   Diet/type: clear liquids DVT prophylaxis: not indicated GI prophylaxis: PPI Lines: N/A Foley:  N/A Code Status:  full code Last date of multidisciplinary goals of care discussion [tbd]  Labs   CBC: Recent Labs  Lab 01/18/22 1928 01/20/22 0557 01/20/22 1313  WBC 5.2 1.8* 1.5*  NEUTROABS 4.7  --   --   HGB 11.1* 9.5* 8.9*  HCT 31.4* 27.7*  26.1*  MCV 88.7 90.8 92.6  PLT 133* 103* 96*    Basic Metabolic Panel: Recent Labs  Lab 01/18/22 1928 01/20/22 0557  NA 129* 139  K 3.9 3.8  CL 91* 106  CO2 25 26  GLUCOSE 131* 107*  BUN 17 12  CREATININE 0.91 0.91  CALCIUM 9.2 8.7*   GFR: Estimated Creatinine Clearance: 46.4 mL/min (by C-G formula based on SCr of 0.91 mg/dL). Recent Labs  Lab 01/18/22 1928 01/20/22 0557 01/20/22 1313  WBC 5.2 1.8* 1.5*    Liver Function Tests: Recent Labs  Lab 01/18/22 1928  AST 40  ALT 26  ALKPHOS 71  BILITOT 0.4  PROT 7.1  ALBUMIN 4.1   Recent Labs  Lab 01/18/22 1928  LIPASE 38   No results for input(s): "AMMONIA" in the last 168 hours.  ABG No results found for: "PHART", "PCO2ART", "PO2ART", "HCO3", "TCO2", "ACIDBASEDEF", "O2SAT"   Coagulation Profile: Recent Labs  Lab 01/18/22 2301  INR 1.0    Cardiac Enzymes: No results for input(s): "CKTOTAL", "CKMB", "CKMBINDEX", "TROPONINI" in the last 168 hours.  HbA1C: Hgb A1c MFr Bld  Date/Time Value Ref Range Status  08/18/2020 09:33 AM 5.8 (H) 4.8 - 5.6 % Final    Comment:             Prediabetes: 5.7 - 6.4          Diabetes: >6.4          Glycemic control for adults with diabetes: <7.0     CBG: Recent Labs  Lab 01/18/22 1918  GLUCAP 131*    Review of Systems:   10 point review of system taken, please see HPI for positives and negatives.  Positive for headaches, positive for nausea vomiting diarrhea.  Past Medical History:  She,  has a past medical history of Anemia, CAD (coronary artery disease), History of gastric ulcer, Hyperlipidemia, Hypertension, IBS (irritable bowel syndrome), Leukopenia, Migraine, Osteoporosis (11/18/2004), Shingles, and Trigeminal neuralgia (03/05/2012).   Surgical History:   Past Surgical History:  Procedure Laterality Date   ABDOMINAL HYSTERECTOMY  2002   CORONARY ARTERY BYPASS GRAFT  2002   LUMBAR SPINE SURGERY     Stress cardiolite  09/11/2008   Probably normal with  breast attenuation noted but no evidence of ischemia   TONSILLECTOMY  ~ 1965     Social History:   reports that she has never smoked. She has never used smokeless tobacco. She reports that she does not drink alcohol and does not use drugs.   Family History:  Her family history includes Arthritis in her mother; Dementia in her mother; Diabetes in her father and mother; Heart disease in her father; Hypertension in her father and mother; Stroke in her father. There is no history of Colon cancer or Breast cancer.   Allergies Allergies  Allergen Reactions   Amoxicillin-Pot Clavulanate     REACTION: Rash on tongue   Azithromycin Other (See Comments)    diarrhea   Codeine Nausea Only   Delsym [Dextromethorphan]     GI upset.   Doxycycline  GI intolerance   Ibandronate Sodium     REACTION: GI side effects   Iohexol      Desc: HIVES,NASAL CONGESTION DURING A CARDIAC CATH. REQUIRES PRE-MEDS.    Raloxifene     REACTION: GI upset   Sulfonamide Derivatives     REACTION: itching   Zoledronic Acid     REACTION: unable to take this as she was intolerant of pre-tx calcium and vitamin D   Iodinated Contrast Media Rash     Home Medications  Prior to Admission medications   Medication Sig Start Date End Date Taking? Authorizing Provider  amLODipine (NORVASC) 5 MG tablet Take 2 tablets (10 mg total) by mouth daily. Patient taking differently: Take 5 mg by mouth daily. 08/31/21  Yes Tonia Ghent, MD  aspirin 81 MG tablet Take 81 mg by mouth daily.   Yes [provider]  carbamazepine (TEGRETOL) 200 MG tablet TAKE 2 TABLETS IN THE MORNING AND 2 TABLETS AT NIGHT 06/25/21  Yes Tonia Ghent, MD  dexlansoprazole (DEXILANT) 60 MG capsule Take 60 mg by mouth daily.   Yes [provider]  ezetimibe (ZETIA) 10 MG tablet TAKE 1 TABLET EVERY DAY 08/18/21  Yes Belva Crome, MD  famotidine (PEPCID) 20 MG tablet Take 1 tablet (20 mg total) by mouth 2 (two) times daily. 07/09/21   Yes Tonia Ghent, MD  gabapentin (NEURONTIN) 100 MG capsule Take 1 capsule (100 mg total) by mouth daily as needed (for cough). 11/15/21  Yes Tonia Ghent, MD  hydrochlorothiazide (HYDRODIURIL) 25 MG tablet Take 1 tablet (25 mg total) by mouth daily. 07/15/21  Yes Tonia Ghent, MD  levocetirizine (XYZAL) 5 MG tablet Take 5 mg by mouth daily.   Yes [provider]  metoprolol tartrate (LOPRESSOR) 50 MG tablet Take 0.5 tablets (25 mg total) by mouth 2 (two) times daily. 11/15/21  Yes Tonia Ghent, MD  ondansetron (ZOFRAN) 8 MG tablet Take 8 mg by mouth 3 (three) times daily as needed for nausea or vomiting. 01/01/22  Yes [provider]  promethazine (PHENERGAN) 12.5 MG suppository Place 12.5 mg rectally every 6 (six) hours as needed for nausea or vomiting. 01/01/22  Yes [provider]  rosuvastatin (CRESTOR) 40 MG tablet Take 1 tablet (40 mg total) by mouth daily. 08/27/21 08/28/22 Yes Swinyer, Lanice Schwab, NP  spironolactone (ALDACTONE) 25 MG tablet TAKE 1 TABLET BY MOUTH EVERY DAY 08/19/21  Yes Belva Crome, MD  traZODone (DESYREL) 50 MG tablet Take 0.5-1 tablets (25-50 mg total) by mouth at bedtime as needed for sleep. Patient taking differently: Take 50 mg by mouth at bedtime. 10/08/21  Yes Tonia Ghent, MD  benzonatate (TESSALON PERLES) 100 MG capsule 1-2 capsules up to twice daily as needed for cough. Patient not taking: Reported on 01/18/2022 12/28/21   Lucretia Kern, DO  HYDROcodone-acetaminophen (HYCET) 7.5-325 mg/15 ml solution Take 10 mLs by mouth every 6 (six) hours as needed (cough). Patient not taking: Reported on 01/18/2022 01/07/22 01/07/23  Davonna Belling, MD     Critical care time: 29 min     Richardson Landry Hennie Gosa ACNP Acute Care Nurse Practitioner [place] Please consult Amion 01/21/2022, 10:33 AM

## 2022-01-21 NOTE — Progress Notes (Signed)
   Providing Compassionate, Quality Care - Together   Subjective: Patient reports nausea is a little better. Required some Zofran recently. Cardene was started last night requiring patient's level of care to be escalated to ICU. Nurse tells me patient was unsteady when assisted to standing with PT. She was unable to ambulate. PT recommending CIR at discharge.  Objective: Vital signs in last 24 hours: Temp:  [97.7 F (36.5 C)-98.4 F (36.9 C)] 98.1 F (36.7 C) (01/12 0800) Pulse Rate:  [54-84] 54 (01/12 1200) Resp:  [11-38] 15 (01/12 1200) BP: (129-176)/(71-144) 147/89 (01/12 1200) SpO2:  [92 %-98 %] 96 % (01/12 1200) Weight:  [49.7 kg] 49.7 kg (01/12 0108)  Intake/Output from previous day: 01/11 0701 - 01/12 0700 In: 4747.6 [I.V.:4747.6] Out: 400 [Urine:400] Intake/Output this shift: Total I/O In: -  Out: 1150 [Urine:1150]  Alert and oriented x 4 PERRLA Speech clear and fluent CN II-XII grossly intact MAE, Strength and sensation intact    Lab Results: Recent Labs    01/20/22 0557 01/20/22 1313  WBC 1.8* 1.5*  HGB 9.5* 8.9*  HCT 27.7* 26.1*  PLT 103* 96*   BMET Recent Labs    01/18/22 1928 01/20/22 0557  NA 129* 139  K 3.9 3.8  CL 91* 106  CO2 25 26  GLUCOSE 131* 107*  BUN 17 12  CREATININE 0.91 0.91  CALCIUM 9.2 8.7*    Studies/Results: No results found.  Assessment/Plan: Patient with traumatic SAH. N/V has been present for several days and it's not clear if it is related to the [facility]. Continue to encourage patient to sit upright in bed or chair. Encourage mobilization.     LOS: 3 days     Viona Gilmore, DNP, AGNP-C Nurse Practitioner  [facility] [place] [address], [place], [place] [address] P: [phone]    F: [phone]  01/21/2022, 1:27 PM

## 2022-01-21 NOTE — Evaluation (Signed)
Physical Therapy Evaluation Patient Details Name: CASSADEE VANZANDT MRN: [id number] DOB: Jul 18, 1955 Today's Date: 01/21/2022  History of Present Illness  67 yo female admitted 1/9 with traumatic SAH s/p falls PMH anemia, CAD, gastic ulcer HLD HTN IBS leukopenia, migraines Osteoporosis, trigeminal neuralgia  Clinical Impression  Pt admitted with/for SAH, traumatic due to fall.  Today session limited by high BP, but pt did need minimal assist for stability during standing..  Pt currently limited functionally due to the problems listed. ( See problems list.)   Pt will benefit from PT to maximize function and safety in order to get ready for next venue listed below.        Recommendations for follow up therapy are one component of a multi-disciplinary discharge planning process, led by the attending physician.  Recommendations may be updated based on patient status, additional functional criteria and insurance authorization.  Follow Up Recommendations Acute inpatient rehab (3hours/day) (unless progresses faster than expected)      Assistance Recommended at Discharge Intermittent Supervision/Assistance  Patient can return home with the following  A little help with walking and/or transfers;A little help with bathing/dressing/bathroom;Assistance with cooking/housework;Assist for transportation;Help with stairs or ramp for entrance    Equipment Recommendations None recommended by PT;Other (comment) (TBD)  Recommendations for Other Services  Rehab consult    Functional Status Assessment Patient has had a recent decline in their functional status and demonstrates the ability to make significant improvements in function in a reasonable and predictable amount of time.     Precautions / Restrictions Precautions Precautions: Fall Precaution Comments: watch SBP <160      Mobility  Bed Mobility Overal bed mobility: Needs Assistance Bed Mobility: Rolling, Supine to Sit, Sit to Supine Rolling:  Supervision   Supine to sit: Supervision Sit to supine: Min guard   General bed mobility comments: pt able to progress with increased time and HOB elevated 45 degrees to eob.    Transfers Overall transfer level: Needs assistance Equipment used: 2 person hand held assist Transfers: Sit to/from Stand Sit to Stand: Min assist           General transfer comment: pt with immediate lob posterior. pt is unaware of posterior bias. pt with heel weight shift and needs cues to anterior shift with poor sustaining task    Ambulation/Gait                  Stairs            Wheelchair Mobility    Modified Rankin (Stroke Patients Only)       Balance Overall balance assessment: Needs assistance Sitting-balance support: Bilateral upper extremity supported, Feet supported Sitting balance-Leahy Scale: Fair     Standing balance support: Bilateral upper extremity supported, During functional activity, Reliant on assistive device for balance Standing balance-Leahy Scale: Poor Standing balance comment: posterior bias. pt statis standing with toes off the ground and all her weight on her heels                             Pertinent Vitals/Pain Pain Assessment Pain Assessment: 0-10 Pain Score: 2  Pain Location: L eye area Pain Descriptors / Indicators: Headache Pain Intervention(s): Monitored during session    Home Living Family/patient expects to be discharged to:: Private residence Living Arrangements: Alone Available Help at Discharge: Family;Friend(s);Available PRN/intermittently ([employer] family as needed) Type of Home: House Home Access: Level entry     Alternate Level Stairs-Number  of Steps: flight of stairs Home Layout: Two level;Bed/bath upstairs Home Equipment: Grab bars - tub/shower      Prior Function Prior Level of Function : Independent/Modified Independent;Driving               ADLs Comments: 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 and states "i work as much as i want toGeneticist, molecular Dominance   Dominant Hand: Right    Extremity/Trunk Assessment   Upper Extremity Assessment Upper Extremity Assessment: Overall WFL for tasks assessed    Lower Extremity Assessment Lower Extremity Assessment: Overall WFL for tasks assessed    Cervical / Trunk Assessment Cervical / Trunk Assessment: Normal  Communication   Communication: No difficulties  Cognition Arousal/Alertness: Awake/alert Behavior During Therapy: WFL for tasks assessed/performed Overall Cognitive Status: Impaired/Different from baseline Area of Impairment: Awareness                           Awareness: Anticipatory   General Comments: pt is able to answer questions about home and general activity. pt is not able to have awareness at this time to posterior lean and fall risk. pt tearful after session completing when pt was told that continued therapy would be needed monday and likley would not recommend home on monday from therapy stand point.        General Comments General comments (skin integrity, edema, etc.): Higher than allowed BP limited session to up standing then back to supine.    Exercises Other Exercises Other Exercises: warm up AROM with graded resistance bil x10   Assessment/Plan    PT Assessment Patient needs continued PT services  PT Problem List Decreased activity tolerance;Decreased balance;Decreased mobility;Decreased coordination;Decreased safety awareness       PT Treatment Interventions DME instruction;Gait training;Stair training;Functional mobility training;Therapeutic activities;Balance training;Patient/family education;Neuromuscular re-education    PT Goals (Current goals can be found in the Care Plan section)  Acute Rehab PT Goals Patient Stated Goal: ready to go home PT Goal Formulation: With patient Time For Goal Achievement: 02/04/22 Potential to Achieve Goals:  Good    Frequency Min 3X/week     Co-evaluation PT/OT/SLP Co-Evaluation/Treatment: Yes Reason for Co-Treatment: For patient/therapist safety;To address functional/ADL transfers PT goals addressed during session: Mobility/safety with mobility         AM-PAC PT "6 Clicks" Mobility  Outcome Measure Help needed turning from your back to your side while in a flat bed without using bedrails?: A Little Help needed moving from lying on your back to sitting on the side of a flat bed without using bedrails?: A Little Help needed moving to and from a bed to a chair (including a wheelchair)?: A Little Help needed standing up from a chair using your arms (e.g., wheelchair or bedside chair)?: A Little Help needed to walk in hospital room?: A Little Help needed climbing 3-5 steps with a railing? : A Lot 6 Click Score: 17    End of Session   Activity Tolerance: Patient tolerated treatment well Patient left: in bed;with call bell/phone within reach;with bed alarm set;with family/visitor present Nurse Communication: Mobility status PT Visit Diagnosis: Unsteadiness on feet (R26.81);Other symptoms and signs involving the nervous system (W29.937)    Time: 1696-7893 PT Time Calculation (min) (ACUTE ONLY): 26 min   Charges:   PT Evaluation $PT Eval Moderate Complexity: 1 Mod          01/21/2022  Jacinto Halim., PT Acute Rehabilitation Services [phone]  (office)  Eliseo Gum Corisa Montini 01/21/2022, 5:43 PM

## 2022-01-21 NOTE — Evaluation (Signed)
Occupational Therapy Evaluation Patient Details Name: Karen Saunders MRN: [id number] DOB: [birth year] Today's Date: 01/21/2022   History of Present Illness 67 yo female admitted with traumatic SAH s/p falls PMH anemia, CAD, gastic ulcer HLD HTN IBS leukopenia, migraines Osteoporosis, trigeminal neuralgia   Clinical Impression   PT admitted with SAH with x3 falls in 6 months. Pt currently with functional limitiations due to the deficits listed below (see OT problem list). 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 Pt currently with posterior bias and high fall risk with decreased awareness. Pt with SBP max 174 with movement this session and a sustained MAP 113 even when the SBP was below 160. Pt given increased time to rest between mobility movements due to BP elevation.  Pt will benefit from skilled OT to increase their independence and safety with adls and balance to allow discharge CIR pending progress. Pt lives alone and will need to be indep. Pt very much wants to d/c home alone and resume her daily life.        Recommendations for follow up therapy are one component of a multi-disciplinary discharge planning process, led by the attending physician.  Recommendations may be updated based on patient status, additional functional criteria and insurance authorization.   Follow Up Recommendations  Acute inpatient rehab (3hours/day)     Assistance Recommended at Discharge Intermittent Supervision/Assistance  Patient can return home with the following A little help with walking and/or transfers;A little help with bathing/dressing/bathroom    Functional Status Assessment  Patient has had a recent decline in their functional status and demonstrates the ability to make significant improvements in function in a reasonable and predictable amount of time.  Equipment Recommendations  Other (comment) (RW)    Recommendations for Other Services Rehab consult     Precautions /  Restrictions Precautions Precautions: Fall Precaution Comments: watch SBP <160      Mobility Bed Mobility Overal bed mobility: Needs Assistance Bed Mobility: Rolling, Supine to Sit, Sit to Supine Rolling: Supervision   Supine to sit: Supervision Sit to supine: Min guard   General bed mobility comments: pt able to progress with increased time and HOB elevated 45 degrees to eob.    Transfers Overall transfer level: Needs assistance Equipment used: 2 person hand held assist Transfers: Sit to/from Stand Sit to Stand: Min assist           General transfer comment: pt with immediate lob posterior. pt is unaware of posterior bias. pt with heel weight shift and needs cues to anterior shift with poor sustaining task      Balance Overall balance assessment: Needs assistance Sitting-balance support: Bilateral upper extremity supported, Feet supported Sitting balance-Leahy Scale: Fair     Standing balance support: Bilateral upper extremity supported, During functional activity, Reliant on assistive device for balance Standing balance-Leahy Scale: Poor Standing balance comment: posterior bias. pt statis standing with toes off the ground and all her weight on her heels                           ADL either performed or assessed with clinical judgement   ADL Overall ADL's : Needs assistance/impaired Eating/Feeding: Set up   Grooming: Set up;Sitting   Upper Body Bathing: Minimal assistance;Sitting   Lower Body Bathing: Moderate assistance;Sit to/from stand   Upper Body Dressing : Minimal assistance   Lower Body Dressing: Moderate assistance   Toilet Transfer: Moderate assistance (will need  to formally test with RW) Toilet Transfer Details (indicate cue type and reason): simulated with bed transfer to standing and side stepping           General ADL Comments: pt reports x3 falls in 6 months. Pt does not give any details regarding prior falls with specific  questions asked but rather reinforces that only due to sickness did this fall happen. pt states i only fall when i am getting sick. Pt was unable to give details for other falls     Vision Baseline Vision/History: 1 Wears glasses (reading) Patient Visual Report: No change from baseline       Perception     Praxis      Pertinent Vitals/Pain Pain Assessment Pain Assessment: 0-10 Pain Score: 2  Pain Location: L eye area Pain Descriptors / Indicators: Headache Pain Intervention(s): Monitored during session, Premedicated before session, Repositioned, Limited activity within patient's tolerance     Hand Dominance Right   Extremity/Trunk Assessment Upper Extremity Assessment Upper Extremity Assessment: Overall WFL for tasks assessed   Lower Extremity Assessment Lower Extremity Assessment: Overall WFL for tasks assessed   Cervical / Trunk Assessment Cervical / Trunk Assessment: Normal   Communication Communication Communication: No difficulties   Cognition Arousal/Alertness: Awake/alert Behavior During Therapy: WFL for tasks assessed/performed Overall Cognitive Status: Impaired/Different from baseline Area of Impairment: Awareness                           Awareness: Anticipatory   General Comments: pt is able to answer questions about home and general activity. pt is not able to have awareness at this time to posterior lean and fall risk. pt tearful after session completing when pt was told that continued therapy would be needed monday and likley would not recommend home on monday from therapy stand point.     General Comments       Exercises     Shoulder Instructions      Home Living Family/patient expects to be discharged to:: Private residence Living Arrangements: Alone Available Help at Discharge: Family;Friend(s);Available PRN/intermittently (Church family as needed) Type of Home: House Home Access: Level entry     Home Layout: Two level;Bed/bath  upstairs Alternate Level Stairs-Number of Steps: flight of stairs Alternate Level Stairs-Rails: Left Bathroom Shower/Tub: Producer, television/film/video: Standard     Home Equipment: Grab bars - tub/shower          Prior Functioning/Environment Prior Level of Function : Independent/Modified Independent;Driving               ADLs Comments: 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 [employer]- works PRN so hours vary and states "i work as much as i want to"        OT Problem List: Decreased activity tolerance;Impaired balance (sitting and/or standing);Decreased cognition;Decreased safety awareness      OT Treatment/Interventions: Self-care/ADL training;Therapeutic exercise;Energy conservation;DME and/or AE instruction;Therapeutic activities;Cognitive remediation/compensation;Patient/family education;Balance training    OT Goals(Current goals can be found in the care plan section) Acute Rehab OT Goals Patient Stated Goal: to go home monday OT Goal Formulation: With patient Time For Goal Achievement: 02/04/22 Potential to Achieve Goals: Good  OT Frequency: Min 2X/week    Co-evaluation PT/OT/SLP Co-Evaluation/Treatment: Yes Reason for Co-Treatment: For patient/therapist safety;To address functional/ADL transfers   OT goals addressed during session: ADL's and self-care      AM-PAC OT "6 Clicks" Daily Activity     Outcome Measure  Help from another person eating meals?: None Help from another person taking care of personal grooming?: None Help from another person toileting, which includes using toliet, bedpan, or urinal?: A Little Help from another person bathing (including washing, rinsing, drying)?: A Little Help from another person to put on and taking off regular upper body clothing?: A Little Help from another person to put on and taking off regular lower body clothing?: A Little 6 Click Score: 20   End of Session Nurse Communication: Mobility  status;Precautions  Activity Tolerance: Patient tolerated treatment well Patient left: in bed;with call bell/phone within reach;with bed alarm set;with family/visitor present  OT Visit Diagnosis: Unsteadiness on feet (R26.81);Muscle weakness (generalized) (M62.81)                Time: 3329-5188 OT Time Calculation (min): 24 min Charges:  OT General Charges $OT Visit: 1 Visit OT Evaluation $OT Eval Moderate Complexity: 1 Mod   Brynn, OTR/L  Acute Rehabilitation Services Office: [phone] .   Jeri Modena 01/21/2022, 3:00 PM

## 2022-01-21 NOTE — Progress Notes (Signed)
   Inpatient Rehab Admissions Coordinator :  Per therapy recommendations, patient was screened for CIR candidacy by Danne Baxter RN MSN.  At this time patient appears to be a potential candidate for CIR. I will place a rehab consult per protocol for full assessment. Please call me with any questions.  Danne Baxter RN MSN Admissions Coordinator [phone]

## 2022-01-21 NOTE — ED Notes (Signed)
[facility] at bedside 

## 2022-01-22 DIAGNOSIS — T1490XA Injury, unspecified, initial encounter: Secondary | ICD-10-CM

## 2022-01-22 MED ORDER — HYDRALAZINE HCL 20 MG/ML IJ SOLN
20.0000 mg | Freq: Four times a day (QID) | INTRAMUSCULAR | Status: DC | PRN
  Administered 2022-01-22: 20 mg via INTRAVENOUS
  Filled 2022-01-22: qty 1

## 2022-01-22 MED ORDER — HYDRALAZINE HCL 20 MG/ML IJ SOLN
10.0000 mg | Freq: Four times a day (QID) | INTRAMUSCULAR | Status: DC | PRN
  Administered 2022-01-23: 10 mg via INTRAVENOUS
  Filled 2022-01-22: qty 1

## 2022-01-22 MED ORDER — PROCHLORPERAZINE EDISYLATE 10 MG/2ML IJ SOLN
10.0000 mg | Freq: Once | INTRAMUSCULAR | Status: AC
  Administered 2022-01-22: 10 mg via INTRAVENOUS
  Filled 2022-01-22: qty 2

## 2022-01-22 NOTE — Progress Notes (Signed)
Report called to Al receiving Rn [id number]. Patient with no complaints at the current time. Will transfer via bad.

## 2022-01-22 NOTE — Progress Notes (Signed)
IP rehab admissions - I met with patient and her sister at the bedside.  I gave rehab booklets to them and explained rehab options.  Sister would like CIR and then home with sister providing help and supervision.  I will have my partner follow up on Monday for progress and plans.  Call for questions.  [phone]

## 2022-01-22 NOTE — PMR Pre-admission (Shared)
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 Date: ***     Deduct: ***      Out of Pocket Max: ***      Life Max: *** CIR: ***      SNF: *** Outpatient: ***     Co-Pay: *** Home Health: ***      Co-Pay: *** DME: ***     Co-Pay: *** Providers: in network  SECONDARY:       Policy#:      Phone#:   [phone]:       Phone#:   The Artist" for patients in Inpatient Rehabilitation Facilities with attached "Privacy Act Statement-Health Care Records" was provided and verbally reviewed with: Patient  Emergency Contact Information Contact Information     Name Relation Home Work Mobile   Rogers,Patsy Sister [phone]  [phone]   karoline, fleer   [place]       Current Medical History  Patient Admitting Diagnosis: Traumatic fall with SAH  History of Present Illness: A 67 year old female status post multiple falls. Patient with history of diarrheal illness with probable dehydration who suffered a number of falls at home. Patient has developed severe headache with some associated nausea and vomiting. She is having no speech or language difficulties. She is having no seizures. She is having no numbness paresthesias or weakness. She complains of severe headache. She denies other areas of pain. Specifically she denies neck pain chest or abdominal pain. She is brought in to 73 emergency room on 01/18/22 by a friend who states that she is confused. Patient is GCS 15 but  occasionally disoriented to situation. Patient was transferred and admitted to the ICU at [facility] on 01/21/22.  Patient continues with nausea and vomiting on 01/22/22.  Patient was evaluated by PT/OT with recommendations for acute inpatient rehab admission.  Complete NIHSS TOTAL: 0  Patient's medical record from [facility] has been reviewed by the rehabilitation admission coordinator and physician.  Past Medical History  Past Medical History:  Diagnosis Date   Anemia    with prev unremarkable heme eval ~2011   CAD (coronary artery disease)    Dr. [facility] with Cards   History of gastric ulcer    2005    Hyperlipidemia    Hypertension    IBS (irritable bowel syndrome)    Leukopenia    with prev unremarkable heme eval ~2011   Migraine    Unsp w/o intract w/o status migrainosus   Osteoporosis 11/18/2004   Shingles    Trigeminal neuralgia 03/05/2012    Has the patient had major surgery during 100 days prior to admission? No  Family History   family history includes Arthritis in her mother; Dementia in her mother; Diabetes in her father and mother; Heart disease in her father;  Hypertension in her father and mother; Stroke in her father.  Current Medications  Current Facility-Administered Medications:    0.9 %  sodium chloride infusion, , Intravenous, Continuous, Minor, Grace Bushy, NP, Last Rate: 50 mL/hr at 01/22/22 1000, Infusion Verify at 01/22/22 1000   acetaminophen (TYLENOL) tablet 650 mg, 650 mg, Oral, Q6H PRN **OR** acetaminophen (TYLENOL) suppository 650 mg, 650 mg, Rectal, Q6H PRN, Pool, Mallie Mussel, MD   amLODipine (NORVASC) tablet 5 mg, 5 mg, Oral, Daily, Pool, Henry, MD, 5 mg at 01/22/22 1129   carbamazepine (TEGRETOL) tablet 400 mg, 400 mg, Oral, BID, Pool, Mallie Mussel, MD, 400 mg at 01/22/22 1130   Chlorhexidine Gluconate Cloth 2 % PADS 6 each, 6 each, Topical, Daily, Earnie Larsson, MD, 6 each at 01/22/22 1131   ezetimibe (ZETIA) tablet 10 mg, 10 mg, Oral,  Daily, Pool, Mallie Mussel, MD, 10 mg at 01/22/22 1130   famotidine (PEPCID) tablet 20 mg, 20 mg, Oral, BID, Pool, Mallie Mussel, MD, 20 mg at 01/22/22 1130   gabapentin (NEURONTIN) capsule 100 mg, 100 mg, Oral, Daily PRN, Earnie Larsson, MD   hydrALAZINE (APRESOLINE) injection 20 mg, 20 mg, Intravenous, Q6H PRN, Dewaine Oats, Katalina M, NP, 20 mg at 01/22/22 0849   hydrochlorothiazide (HYDRODIURIL) tablet 25 mg, 25 mg, Oral, Daily, Pool, Mallie Mussel, MD, 25 mg at 01/22/22 1130   HYDROcodone-acetaminophen (NORCO/VICODIN) 5-325 MG per tablet 1-2 tablet, 1-2 tablet, Oral, Q4H PRN, Earnie Larsson, MD, 2 tablet at 01/22/22 0631   HYDROmorphone (DILAUDID) injection 0.5-1 mg, 0.5-1 mg, Intravenous, Q2H PRN, Earnie Larsson, MD, 1 mg at 01/21/22 2103   loratadine (CLARITIN) tablet 10 mg, 10 mg, Oral, Daily, Pool, Mallie Mussel, MD, 10 mg at 01/22/22 1130   metoprolol tartrate (LOPRESSOR) tablet 25 mg, 25 mg, Oral, BID, Pool, Mallie Mussel, MD, 25 mg at 01/22/22 1130   ondansetron (ZOFRAN) tablet 4 mg, 4 mg, Oral, Q6H PRN, 4 mg at 01/21/22 0117 **OR** ondansetron (ZOFRAN) injection 4 mg, 4 mg, Intravenous, Q6H PRN, Earnie Larsson, MD, 4 mg at 01/22/22 5366   Oral care mouth rinse, 15 mL, Mouth Rinse, PRN, Pool, Mallie Mussel, MD   pantoprazole (PROTONIX) EC tablet 40 mg, 40 mg, Oral, Daily, Pool, Mallie Mussel, MD, 40 mg at 01/22/22 1130   promethazine (PHENERGAN) suppository 12.5 mg, 12.5 mg, Rectal, Q6H PRN, Earnie Larsson, MD, 12.5 mg at 01/20/22 2201   rosuvastatin (CRESTOR) tablet 40 mg, 40 mg, Oral, Daily, Pool, Mallie Mussel, MD, 40 mg at 01/22/22 1130   senna-docusate (Senokot-S) tablet 1 tablet, 1 tablet, Oral, QHS PRN, Earnie Larsson, MD   spironolactone (ALDACTONE) tablet 25 mg, 25 mg, Oral, Daily, Pool, Mallie Mussel, MD, 25 mg at 01/22/22 1130   traZODone (DESYREL) tablet 50 mg, 50 mg, Oral, QHS, Pool, Mallie Mussel, MD, 50 mg at 01/21/22 2103  Patients Current Diet:  Diet Order             Diet regular Room service appropriate? Yes; Fluid consistency: Thin  Diet effective now                    Precautions / Restrictions Precautions Precautions: Fall Precaution Comments: watch SBP <160 Restrictions Weight Bearing Restrictions: No   Has the patient had 2 or more falls or a fall with injury in the past year? 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  Prior Functional Level Self Care: Did the patient need help bathing, dressing, using the toilet or eating? Independent  Indoor Mobility: Did the patient need assistance  with walking from room to room (with or without device)? Independent  Stairs: Did the patient need assistance with internal or external stairs (with or without device)? Independent  Functional Cognition: Did the patient need help planning regular tasks such as shopping or remembering to take medications? Independent  Patient Information Are you of Hispanic, Latino/a,or Spanish origin?: A. No, not of Hispanic, Latino/a, or Spanish origin What is your race?: B. Black or African American Do you need or want an interpreter to communicate with a doctor or health care staff?: 0. No  Patient's Response To:  Health Literacy and Transportation Is the patient able to respond to health literacy and transportation needs?: Yes Health Literacy - How often do you need to have someone help you when you read instructions, pamphlets, or other written material from your doctor or pharmacy?: Never In the past 12 months, has lack of transportation kept you from medical appointments or from getting medications?: No In the past 12 months, has lack of transportation kept you from meetings, work, or from getting things needed for daily living?: No  Home Assistive Devices / Equipment Home Equipment: Grab bars - tub/shower  Prior Device Use: Indicate devices/aids used by the patient prior to current illness, exacerbation or injury? None of the above  Current Functional Level Cognition  Overall  Cognitive Status: Impaired/Different from baseline Orientation Level: Oriented X4 General Comments: pt is able to answer questions about home and general activity. pt is not able to have awareness at this time to posterior lean and fall risk. pt tearful after session completing when pt was told that continued therapy would be needed monday and likley would not recommend home on monday from therapy stand point.    Extremity Assessment (includes Sensation/Coordination)  Upper Extremity Assessment: Overall WFL for tasks assessed  Lower Extremity Assessment: Overall WFL for tasks assessed    ADLs  Overall ADL's : Needs assistance/impaired Eating/Feeding: Set up Grooming: Set up, Sitting Upper Body Bathing: Minimal assistance, Sitting Lower Body Bathing: Moderate assistance, Sit to/from stand Upper Body Dressing : Minimal assistance Lower Body Dressing: Moderate assistance Toilet Transfer: Moderate assistance (will need to formally test with RW) Toilet Transfer Details (indicate cue type and reason): simulated with bed transfer to standing and side stepping General ADL Comments: pt reports x3 falls in 6 months. Pt does not give any details regarding prior falls with specific questions asked but rather reinforces that only due to sickness did this fall happen. pt states i only fall when i am getting sick. Pt was unable to give details for other falls    Mobility  Overal bed mobility: Needs Assistance Bed Mobility: Rolling, Supine to Sit, Sit to Supine Rolling: Supervision Supine to sit: Supervision Sit to supine: Min guard General bed mobility comments: pt able to progress with increased time and HOB elevated 45 degrees to eob.    Transfers  Overall transfer level: Needs assistance Equipment used: 2 person hand held assist Transfers: Sit to/from Stand Sit to Stand: Min assist General transfer comment: pt with immediate lob posterior. pt is unaware of posterior bias. pt with heel weight  shift and needs cues to anterior shift with poor sustaining task    Ambulation / Gait / Stairs / Wheelchair Mobility       Posture / Balance Balance Overall balance assessment: Needs assistance Sitting-balance support: Bilateral upper extremity supported, Feet supported Sitting balance-Leahy Scale: Fair Standing balance support: Bilateral upper extremity supported, During functional activity, Reliant on assistive device for balance  Standing balance-Leahy Scale: Poor Standing balance comment: posterior bias. pt statis standing with toes off the ground and all her weight on her heels    Special needs/care consideration Special service needs ***   Previous Home Environment (from acute therapy documentation) Living Arrangements: Alone Available Help at Discharge: Family, Friend(s), Available PRN/intermittently (Church family as needed) Type of Home: House Home Layout: Two level, Bed/bath upstairs Alternate Level Stairs-Rails: Left Alternate Level Stairs-Number of Steps: flight of stairs Home Access: Level entry Bathroom Shower/Tub: Health visitor: Standard  Discharge Living Setting Plans for Discharge Living Setting: Patient's home, House, Alone (Patient lives alone.  sister and family to stay with patient.  Lives in [place].) Type of Home at Discharge: House [facility]) Discharge Home Layout: Two level, 1/2 bath on main level, Bed/bath upstairs Alternate Level Stairs-Rails: Right Alternate Level Stairs-Number of Steps: At least 8 steps to 2nd level with bedroom and full bath room Discharge Home Access: Level entry Discharge Bathroom Shower/Tub: Tub/shower unit, Curtain Discharge Bathroom Toilet: Standard Discharge Bathroom Accessibility: Yes How Accessible: Accessible via walker  Social/Family/Support Systems Patient Roles: Other (Comment) (Has sister and church friends to assist.  Son lives out of state.) Contact Information: Arletha Pili - sister -  [phone] Anticipated Caregiver: sister and church friends Ability/Limitations of Caregiver: sister can assist and provide supervision Caregiver Availability: Other (Comment) (Sister understands the need for 24/7 supervision.) Discharge Plan Discussed with Primary Caregiver: Yes Is Caregiver In Agreement with Plan?: Yes Does Caregiver/Family have Issues with Lodging/Transportation while Pt is in Rehab?: No  Goals Patient/Family Goal for Rehab: PT/OT supervision goals Expected length of stay: 12-14 days Pt/Family Agrees to Admission and willing to participate: Yes Program Orientation Provided & Reviewed with Pt/Caregiver Including Roles  & Responsibilities: Yes  Decrease burden of Care through IP rehab admission: N/A  Possible need for SNF placement upon discharge: Not planned  Patient Condition: I have reviewed medical records from [facility], spoken with CM, and patient and family member. I met with patient at the bedside for inpatient rehabilitation assessment.  Patient will benefit from ongoing PT and OT, can actively participate in 3 hours of therapy a day 5 days of the week, and can make measurable gains during the admission.  Patient will also benefit from the coordinated team approach during an Inpatient Acute Rehabilitation admission.  The patient will receive intensive therapy as well as Rehabilitation physician, nursing, social worker, and care management interventions.  Due to bladder management, bowel management, safety, skin/wound care, disease management, medication administration, pain management, and patient education the patient requires 24 hour a day rehabilitation nursing.  The patient is currently *** with mobility and basic ADLs.  Discharge setting and therapy post discharge at home with home health is anticipated.  Patient has agreed to participate in the Acute Inpatient Rehabilitation Program and will admit {Time; today/tomorrow:10263}.  Preadmission Screen  Completed By:  Trish Mage, 01/22/2022 12:03 PM ______________________________________________________________________   Discussed status with Dr. Marland Kitchen on *** at *** and received approval for admission today.  Admission Coordinator:  Trish Mage, RN, time Marland KitchenDorna Bloom ***   Assessment/Plan: Diagnosis: Does the need for close, 24 hr/day Medical supervision in concert with the patient's rehab needs make it unreasonable for this patient to be served in a less intensive setting? {yes_no_potentially:3041433} Co-Morbidities requiring supervision/potential complications: *** Due to {due YW:7371062}, does the patient require 24 hr/day rehab nursing? {yes_no_potentially:3041433} Does the patient require coordinated care of a physician, rehab nurse, PT, OT, and SLP  to address physical and functional deficits in the context of the above medical diagnosis(es)? {yes_no_potentially:3041433} Addressing deficits in the following areas: {deficits:3041436} Can the patient actively participate in an intensive therapy program of at least 3 hrs of therapy 5 days a week? {yes_no_potentially:3041433} The potential for patient to make measurable gains while on inpatient rehab is {potential:3041437} Anticipated functional outcomes upon discharge from inpatient rehab: {functional outcomes:304600100} PT, {functional outcomes:304600100} OT, {functional outcomes:304600100} SLP Estimated rehab length of stay to reach the above functional goals is: *** Anticipated discharge destination: {anticipated dc setting:21604} 10. Overall Rehab/Functional Prognosis: {potential:3041437}   MD Signature: ***

## 2022-01-22 NOTE — Progress Notes (Signed)
NAME:  Karen Saunders, MRN:  [id number], DOB:  1955/10/27, LOS: 4 ADMISSION DATE:  01/18/2022, CONSULTATION DATE:  01/22/2022 REFERRING MD:  Dr. Annette Stable, CHIEF COMPLAINT:  SAH, Hypertensive    History of Present Illness:  67 year old female who presented with several days of nausea vomiting diarrhea and multiple falls while trying to get to the bathroom.  She was evaluated [facility] emergency department found to have a diffuse subarachnoid hemorrhage that did not require invasive interventions.  She was offered p.o. antihypertensives for several days and started on a Cardene drip for systolic blood pressure greater than 160.  She transferred to [facility] to the neurosurgical ICU unit and pulmonary critical care was asked to evaluate for hypertensive.  She has resumed her oral antihypertensive and her Cardene drip is weaning to off at this time.  Pulmonary critical care will continue to follow-up until she is off the Cardene drip.   Pertinent  Medical History  coronary artery disease, PUD, hypertension, IBS, known leukopenia (longstanding), migraines   Significant Hospital Events: Including procedures, antibiotic start and stop dates in addition to other pertinent events   1/12 PCCM Consulted for Cardene management.   Interim History / Subjective:  This AM with nausea requiring Zofran and Compazine. Cardene gtt off.   Objective   Blood pressure 127/69, pulse 69, temperature 98.3 F (36.8 C), temperature source Axillary, resp. rate 16, height 5\' 2"  (1.575 m), weight 49.7 kg, SpO2 100 %.        Intake/Output Summary (Last 24 hours) at 01/22/2022 1116 Last data filed at 01/22/2022 1000 Gross per 24 hour  Intake 2112.26 ml  Output 2550 ml  Net -437.74 ml   Filed Weights   01/19/22 2131 01/21/22 0108  Weight: 49 kg 49.7 kg    Examination: General: Ill appearing adult female, sitting in bed  HENT: Dry MM  Lungs: Clear breath sounds, no use of accessory muscles   Cardiovascular: RRR, HR 69, no mRG Abdomen: soft, non-tender, active bowel sounds  Extremities: -edema Neuro: Alert, oriented, follows commands, motor 5/5 throughout  GU: intact  Resolved Hospital Problem list     Assessment & Plan:   Hypertension requiring Cardene drip.   - Patient is has known hypertension on multiple p.o. medications for hypertension but has had nausea and vomiting  Plan - SBP goal <160 - D/C Cardene gtt  - Continue Norvasc, Hydrodiuril, Lopressor  Nausea/Vomiting Plan - Trend QTC  - Zofran PRN  - Given one dose of compazine this AM for refractory   Diffuse subarachnoid hemorrhage that does not require surgical intervention Per neurosurgery   Chronic anemia and thrombocytopenia - Trend CBC   Chronic Leukopenia  - Trend CBC   History of COVID-19 in December 2023 COVID negative this admission   Best Practice (right click and "Reselect all SmartList Selections" daily)   Diet/type: Regular consistency (see orders) DVT prophylaxis: SCD GI prophylaxis: H2B Lines: N/A Foley:  N/A Code Status:  full code Last date of multidisciplinary goals of care discussion [at bedside 1/13]  Labs   CBC: Recent Labs  Lab 01/18/22 1928 01/20/22 0557 01/20/22 1313  WBC 5.2 1.8* 1.5*  NEUTROABS 4.7  --   --   HGB 11.1* 9.5* 8.9*  HCT 31.4* 27.7* 26.1*  MCV 88.7 90.8 92.6  PLT 133* 103* 96*    Basic Metabolic Panel: Recent Labs  Lab 01/18/22 1928 01/20/22 0557  NA 129* 139  K 3.9 3.8  CL 91* 106  CO2  25 26  GLUCOSE 131* 107*  BUN 17 12  CREATININE 0.91 0.91  CALCIUM 9.2 8.7*   GFR: Estimated Creatinine Clearance: 46.4 mL/min (by C-G formula based on SCr of 0.91 mg/dL). Recent Labs  Lab 01/18/22 1928 01/20/22 0557 01/20/22 1313  WBC 5.2 1.8* 1.5*    Liver Function Tests: Recent Labs  Lab 01/18/22 1928  AST 40  ALT 26  ALKPHOS 71  BILITOT 0.4  PROT 7.1  ALBUMIN 4.1   Recent Labs  Lab 01/18/22 1928  LIPASE 38   No results  for input(s): "AMMONIA" in the last 168 hours.  ABG No results found for: "PHART", "PCO2ART", "PO2ART", "HCO3", "TCO2", "ACIDBASEDEF", "O2SAT"   Coagulation Profile: Recent Labs  Lab 01/18/22 2301  INR 1.0    Cardiac Enzymes: No results for input(s): "CKTOTAL", "CKMB", "CKMBINDEX", "TROPONINI" in the last 168 hours.  HbA1C: Hgb A1c MFr Bld  Date/Time Value Ref Range Status  08/18/2020 09:33 AM 5.8 (H) 4.8 - 5.6 % Final    Comment:             Prediabetes: 5.7 - 6.4          Diabetes: >6.4          Glycemic control for adults with diabetes: <7.0     CBG: Recent Labs  Lab 01/18/22 1918  GLUCAP 131*    Review of Systems:   +Headache, +Nausea/Vomiting    Time Spent: 55 minutes     Hayden Pedro, AGACNP-BC [facility]  PCCM Pgr: [phone]

## 2022-01-22 NOTE — Progress Notes (Signed)
Subjective: The patient is alert and pleasant.  She is in no apparent distress.  She has no complaints.  Objective: Vital signs in last 24 hours: Temp:  [97.7 F (36.5 C)-98.9 F (37.2 C)] 98.3 F (36.8 C) (01/13 0800) Pulse Rate:  [50-90] 80 (01/13 1200) Resp:  [10-30] 16 (01/13 1200) BP: (108-178)/(62-119) 126/66 (01/13 1200) SpO2:  [95 %-100 %] 99 % (01/13 1200) Estimated body mass index is 20.04 kg/m as calculated from the following:   Height as of this encounter: 5\' 2"  (1.575 m).   Weight as of this encounter: 49.7 kg.   Intake/Output from previous day: 01/12 0701 - 01/13 0700 In: 1963.1 [I.V.:1963.1] Out: 3050 [Urine:3050] Intake/Output this shift: Total I/O In: 149.2 [I.V.:149.2] Out: -   Physical exam the patient is alert and oriented.  She is moving all 4 extremities well.  Her speech is normal.  Lab Results: Recent Labs    01/20/22 0557 01/20/22 1313  WBC 1.8* 1.5*  HGB 9.5* 8.9*  HCT 27.7* 26.1*  PLT 103* 96*   BMET Recent Labs    01/20/22 0557  NA 139  K 3.8  CL 106  CO2 26  GLUCOSE 107*  BUN 12  CREATININE 0.91  CALCIUM 8.7*    Studies/Results: No results found.  Assessment/Plan: Traumatic subarachnoid hemorrhage: The patient is doing well.  I will move her to the floor.  Perhaps she can go home tomorrow.  LOS: 4 days     Ophelia Charter 01/22/2022, 12:48 PM     Patient ID: Karen Saunders, female   DOB: 1955/12/15, 67 y.o.   MRN: [id number]

## 2022-01-22 NOTE — Progress Notes (Signed)
PT Cancellation Note  Patient Details Name: Karen Saunders MRN: [id number] DOB: 16-Oct-1955   Cancelled Treatment:    Reason Eval/Treat Not Completed: Other (comment). Upon PT arriving pt dry heaving and stating "Help me. I'm so sick." Pt stated she began vomiting when she started eating. RN called and came to room to assess. PT to return as able, as appropriate to progress mobiltiy.  Kittie Plater, PT, DPT Acute Rehabilitation Services Secure chat preferred Office #: [phone]    Berline Lopes 01/22/2022, 9:07 AM

## 2022-01-23 DIAGNOSIS — R112 Nausea with vomiting, unspecified: Secondary | ICD-10-CM | POA: Diagnosis present

## 2022-01-23 DIAGNOSIS — S066X1A Traumatic subarachnoid hemorrhage with loss of consciousness of 30 minutes or less, initial encounter: Secondary | ICD-10-CM

## 2022-01-23 LAB — CBC WITH DIFFERENTIAL/PLATELET
Abs Immature Granulocytes: 0.01 10*3/uL (ref 0.00–0.07)
Basophils Absolute: 0 10*3/uL (ref 0.0–0.1)
Basophils Relative: 0 %
Eosinophils Absolute: 0 10*3/uL (ref 0.0–0.5)
Eosinophils Relative: 0 %
HCT: 29.6 % — ABNORMAL LOW (ref 36.0–46.0)
Hemoglobin: 10.5 g/dL — ABNORMAL LOW (ref 12.0–15.0)
Immature Granulocytes: 0 %
Lymphocytes Relative: 16 %
Lymphs Abs: 0.4 10*3/uL — ABNORMAL LOW (ref 0.7–4.0)
MCH: 31.3 pg (ref 26.0–34.0)
MCHC: 35.5 g/dL (ref 30.0–36.0)
MCV: 88.4 fL (ref 80.0–100.0)
Monocytes Absolute: 0.2 10*3/uL (ref 0.1–1.0)
Monocytes Relative: 7 %
Neutro Abs: 2.1 10*3/uL (ref 1.7–7.7)
Neutrophils Relative %: 77 %
Platelets: 111 10*3/uL — ABNORMAL LOW (ref 150–400)
RBC: 3.35 MIL/uL — ABNORMAL LOW (ref 3.87–5.11)
RDW: 11.9 % (ref 11.5–15.5)
WBC: 2.7 10*3/uL — ABNORMAL LOW (ref 4.0–10.5)
nRBC: 0 % (ref 0.0–0.2)

## 2022-01-23 LAB — COMPREHENSIVE METABOLIC PANEL
ALT: 29 U/L (ref 0–44)
AST: 60 U/L — ABNORMAL HIGH (ref 15–41)
Albumin: 3.6 g/dL (ref 3.5–5.0)
Alkaline Phosphatase: 72 U/L (ref 38–126)
Anion gap: 13 (ref 5–15)
BUN: 13 mg/dL (ref 8–23)
CO2: 23 mmol/L (ref 22–32)
Calcium: 9 mg/dL (ref 8.9–10.3)
Chloride: 99 mmol/L (ref 98–111)
Creatinine, Ser: 1.01 mg/dL — ABNORMAL HIGH (ref 0.44–1.00)
GFR, Estimated: >60 mL/min (ref >60)
Glucose, Bld: 83 mg/dL (ref 70–99)
Potassium: 3.3 mmol/L — ABNORMAL LOW (ref 3.5–5.1)
Sodium: 135 mmol/L (ref 135–145)
Total Bilirubin: 0.6 mg/dL (ref 0.3–1.2)
Total Protein: 6.4 g/dL — ABNORMAL LOW (ref 6.5–8.1)

## 2022-01-23 LAB — TROPONIN I (HIGH SENSITIVITY)
Troponin I (High Sensitivity): 83 ng/L — ABNORMAL HIGH (ref <18)
Troponin I (High Sensitivity): 86 ng/L — ABNORMAL HIGH (ref <18)

## 2022-01-23 MED ORDER — PROMETHAZINE HCL 12.5 MG RE SUPP: 12.5000 mg | Freq: Four times a day (QID) | RECTAL | Status: DC | PRN

## 2022-01-23 MED ORDER — METOPROLOL TARTRATE 5 MG/5ML IV SOLN: 2.5000 mg | Freq: Four times a day (QID) | INTRAVENOUS | Status: DC | PRN

## 2022-01-23 MED ORDER — PANTOPRAZOLE SODIUM 40 MG IV SOLR
40.0000 mg | Freq: Two times a day (BID) | INTRAVENOUS | Status: DC
  Administered 2022-01-23 (×2): 40 mg via INTRAVENOUS
  Filled 2022-01-23 (×3): qty 10

## 2022-01-23 MED ORDER — SUCRALFATE 1 G PO TABS
1.0000 g | ORAL_TABLET | Freq: Three times a day (TID) | ORAL | Status: DC
  Administered 2022-01-23 – 2022-01-28 (×16): 1 g via ORAL
  Filled 2022-01-23 (×20): qty 1

## 2022-01-23 MED ORDER — HYDRALAZINE HCL 20 MG/ML IJ SOLN
5.0000 mg | Freq: Four times a day (QID) | INTRAMUSCULAR | Status: DC | PRN
  Administered 2022-01-23: 5 mg via INTRAVENOUS
  Filled 2022-01-23: qty 1

## 2022-01-23 MED ORDER — PROCHLORPERAZINE EDISYLATE 10 MG/2ML IJ SOLN
10.0000 mg | INTRAMUSCULAR | Status: DC | PRN
  Administered 2022-01-23 – 2022-01-28 (×4): 10 mg via INTRAVENOUS
  Filled 2022-01-23 (×4): qty 2

## 2022-01-23 NOTE — Assessment & Plan Note (Signed)
-  She has had issues with HTN while hospitalized -Due to n/v, she has had difficulty tolerating PO medications -Home meds are continued - amlodipine 5 mg daily, HCTZ 25 mg daily, metoprolol 25 mg BID, spironolactone 25 mg daily -AM labs pending -Decrease IV hydralazine dose to 5 mg q6 prn

## 2022-01-23 NOTE — Plan of Care (Signed)

## 2022-01-23 NOTE — Assessment & Plan Note (Signed)
-  Continue Crestor, Zetia 

## 2022-01-23 NOTE — Assessment & Plan Note (Signed)
-  Present prior to admission -Unremarkable CT on presentation -Will change PO PPI to IV BID for now -Will add Carafate -Continue Zofran/Compazine as needed

## 2022-01-23 NOTE — Progress Notes (Signed)
Physical Therapy Treatment Patient Details Name: Karen Saunders MRN: [id number] DOB: 11/01/1955 Today's Date: 01/23/2022   History of Present Illness 67 yo female admitted with traumatic SAH s/p falls PMH anemia, CAD, gastic ulcer HLD HTN IBS leukopenia, migraines Osteoporosis, trigeminal neuralgia    PT Comments    Pt reporting feeling better today vs yesterday, eager to progress mobility to demonstrate ability to d/c home later this week. Pt ambulatory in hallway with use of RW and increased time, overall pt is supervision for mobility at this time and does need occasional cuing for correcting min posterior bias and trajectory in hallway. Pt states she can have assist of her sister at d/c, updated plan to reflect home.   Initially SBP >170, RN gave IV BP medication prior to mobility and by mid-session pt reporting feeling nauseous, shaky. BP 142/90 upon return to room, then 148/67 when back in supine. RN brought to room to examine pt.    Recommendations for follow up therapy are one component of a multi-disciplinary discharge planning process, led by the attending physician.  Recommendations may be updated based on patient status, additional functional criteria and insurance authorization.  Follow Up Recommendations  Home health PT (with sister support)     Assistance Recommended at Discharge Intermittent Supervision/Assistance  Patient can return home with the following A little help with walking and/or transfers;A little help with bathing/dressing/bathroom;Assistance with cooking/housework;Assist for transportation;Help with stairs or ramp for entrance   Equipment Recommendations  Rolling walker (2 wheels)    Recommendations for Other Services Rehab consult     Precautions / Restrictions Precautions Precautions: Fall Precaution Comments: watch SBP <160 Restrictions Weight Bearing Restrictions: No     Mobility  Bed Mobility Overal bed mobility: Needs Assistance Bed  Mobility: Sit to Supine       Sit to supine: Supervision, HOB elevated   General bed mobility comments: EOB upon PT arrival to room, supervision for return to supine    Transfers Overall transfer level: Needs assistance Equipment used: None Transfers: Sit to/from Stand Sit to Stand: Supervision           General transfer comment: for safety, cues for hand placement on second stand attempt when using RW. STS x3, from EOB and window seat in hallway x2    Ambulation/Gait Ambulation/Gait assistance: Supervision Gait Distance (Feet): 120 Feet Assistive device: Rolling walker (2 wheels) Gait Pattern/deviations: Step-through pattern, Decreased stride length, Trunk flexed Gait velocity: decr     General Gait Details: cues for upright posture, placement in RW. Attempted gait without UE support, pt with posterior bias and weaving of gait without AD   Stairs             Wheelchair Mobility    Modified Rankin (Stroke Patients Only)       Balance Overall balance assessment: Needs assistance Sitting-balance support: Bilateral upper extremity supported, Feet supported Sitting balance-Leahy Scale: Fair     Standing balance support: Bilateral upper extremity supported, During functional activity, Reliant on assistive device for balance Standing balance-Leahy Scale: Poor Standing balance comment: tendency for posterior bias, pt-corrected this session with use of AD                            Cognition Arousal/Alertness: Awake/alert Behavior During Therapy: WFL for tasks assessed/performed Overall Cognitive Status: Within Functional Limits for tasks assessed  Exercises      General Comments General comments (skin integrity, edema, etc.): Initially SBP >170, RN gave IV BP medication and by mid-session pt reporting feeling nauseous, shaky. BP 142/90 upon return to room, then 148/67 when back in  supine. RN brought to room to examine pt      Pertinent Vitals/Pain Pain Assessment Pain Assessment: Faces Faces Pain Scale: Hurts a little bit Pain Location: head Pain Descriptors / Indicators: Headache Pain Intervention(s): Limited activity within patient's tolerance, Monitored during session, Repositioned    Home Living                          Prior Function            PT Goals (current goals can now be found in the care plan section) Acute Rehab PT Goals Patient Stated Goal: ready to go home PT Goal Formulation: With patient Time For Goal Achievement: 02/04/22 Potential to Achieve Goals: Good Progress towards PT goals: Progressing toward goals    Frequency    Min 4X/week      PT Plan Discharge plan needs to be updated    Co-evaluation              AM-PAC PT "6 Clicks" Mobility   Outcome Measure  Help needed turning from your back to your side while in a flat bed without using bedrails?: A Little Help needed moving from lying on your back to sitting on the side of a flat bed without using bedrails?: A Little Help needed moving to and from a bed to a chair (including a wheelchair)?: A Little Help needed standing up from a chair using your arms (e.g., wheelchair or bedside chair)?: A Little Help needed to walk in hospital room?: A Little Help needed climbing 3-5 steps with a railing? : A Little 6 Click Score: 18    End of Session   Activity Tolerance: Patient tolerated treatment well;Other (comment) (limited by nausea) Patient left: in bed;with call bell/phone within reach;with bed alarm set;with family/visitor present Nurse Communication: Mobility status PT Visit Diagnosis: Unsteadiness on feet (R26.81);Other symptoms and signs involving the nervous system (R29.898)     Time: 0867-6195 PT Time Calculation (min) (ACUTE ONLY): 25 min  Charges:  $Gait Training: 8-22 mins $Therapeutic Activity: 8-22 mins                    Stacie Glaze, PT  DPT Acute Rehabilitation Services Pager [phone]  Office [phone]   [place] 01/23/2022, 10:53 AM

## 2022-01-23 NOTE — Progress Notes (Signed)
   Providing Compassionate, Quality Care - Together   Subjective: Patient reports she would like to plan for discharge home, but wants to work with therapy first.  Objective: Vital signs in last 24 hours: Temp:  [98.2 F (36.8 C)-99 F (37.2 C)] 98.2 F (36.8 C) (01/14 0554) Pulse Rate:  [59-90] 66 (01/14 0621) Resp:  [12-30] 17 (01/14 0554) BP: (108-173)/(62-99) 160/89 (01/14 0637) SpO2:  [98 %-100 %] 98 % (01/14 0621)  Intake/Output from previous day: 01/13 0701 - 01/14 0700 In: 149.2 [I.V.:149.2] Out: 200 [Urine:200] Intake/Output this shift: No intake/output data recorded.  Alert and oriented x 4 PERRLA Speech clear and fluent CN II-XII grossly intact MAE, Strength and sensation intact  Lab Results: Recent Labs    01/20/22 1313  WBC 1.5*  HGB 8.9*  HCT 26.1*  PLT 96*   BMET No results for input(s): "NA", "K", "CL", "CO2", "GLUCOSE", "BUN", "CREATININE", "CALCIUM" in the last 72 hours.  Studies/Results: No results found.  Assessment/Plan: Patient with traumatic SAH. N/V has been present for several days and it's not clear if it is related to the [facility]. Continue to encourage patient to sit upright in bed or chair. Encourage mobilization. Will see if PT can see the patient today in hopes of discharging home. Patient would like to work toward discharging home tomorrow.     LOS: 5 days      Viona Gilmore, DNP, AGNP-C Nurse Practitioner  [facility] [place] [address], [place], [place] [address] P: [phone]    F: [phone]  01/23/2022, 7:51 AM

## 2022-01-23 NOTE — Assessment & Plan Note (Signed)
-  No apparent chest pain -Continue ASA

## 2022-01-23 NOTE — Progress Notes (Signed)
Mobility Specialist Progress Note:   01/23/22 1357  Mobility  Activity Contraindicated/medical hold   Pt inappropriate for mobility specialist at this time as advised by RN team. RN advised that pt was still nauseated and dizzy from PT session earlier this morning. Mobility specialist to hold today, will continue to follow for readiness.   Andrey Campanile Mobility Specialist Please contact via SecureChat or  Rehab office at [phone]

## 2022-01-23 NOTE — Assessment & Plan Note (Addendum)
-  Traumatic fall leading to [facility] -Improving from this standpoint and nearing discharge readiness -On Tegretol

## 2022-01-23 NOTE — Consult Note (Signed)
Initial Consultation Note   Patient: Karen Saunders HCW:[id number] DOB: 1955/02/07 PCP: Tonia Ghent, MD DOA: 01/18/2022 DOS: the patient was seen and examined on 01/23/2022 Primary service: Earnie Larsson, MD  Referring physician: Pool/B[place]man Reason for consult: Admitted from [facility] - 2 weeks of n/v.  Fell with SAH, no intervention.  HTN at [facility], unable to tolerate PO and started on Cardene drip and admitted to ICU - now stopped.  N/V with standing, not orthostatic.  Also complained of CP, ordering EKG and troponins.  Labs ok other than chronic neutropenia.   Assessment and Plan: * Traumatic subarachnoid bleed with LOC of 30 minutes or less, initial encounter ([place]) -Traumatic fall leading to [facility] -Improving from this standpoint and nearing discharge readiness -On Tegretol  Resistant hypertension -She has had issues with HTN while hospitalized -Due to n/v, she has had difficulty tolerating PO medications -Home meds are continued - amlodipine 5 mg daily, HCTZ 25 mg daily, metoprolol 25 mg BID, spironolactone 25 mg daily -AM labs pending -Decrease IV hydralazine dose to 5 mg q6 prn   Nausea & vomiting -Present prior to admission -Unremarkable CT on presentation -Will change PO PPI to IV BID for now -Will add Carafate -Continue Zofran/Compazine as needed  Hx of CABG -No apparent chest pain -Continue ASA  HLD (hyperlipidemia) -Continue Crestor, Zetia      TRH will continue to follow the patient.  HPI: Karen Saunders is a 67 y.o. female with past medical history of CAD, HTN, and HLD who presented on 1/10 with a traumatic SAH s/p multiple falls.  She had recent COVID-19 infection and diarrheal illness prior.  COVID home test positive on 12/16 with symptoms at that time (congestion, cough, sore throat).   She was seen in the ER on 12/29 for acute on chronic cough, given hydrocodone cough syrup.  She reports that she developed n/v the day prior to admission; she denies having chronic  n/v.  No hematemesis. She denies diarrhea.  She has been having episodic symptoms since admission.  Her episodes yesterday and today lasted several hours; she believes these were associated with rapid drop in BP associated with IV hydralazine dosing.  She reports that her fall at home happened when she slipped on urine while trying to get to the bathroom in the setting of n/v and BM (does not think it was diarrhea).  On 1/11 chart notes indicate improved nausea.  She tolerated crackers that day but had nausea while trying to eat dinner.  She had persistent nausea through the night.  Nausea was reported to be a little better on 1/12 AM.  PT note from 1/13 noted patient vomiting when she started eating.  This AM, the patient was having n/v during most of my evaluation.  She reported onset during PT while being ambulated again shortly after receiving IV hydralazine.     Review of Systems: As mentioned in the history of present illness. All other systems reviewed and are negative. Past Medical History:  Diagnosis Date   Anemia    with prev unremarkable heme eval ~2011   CAD (coronary artery disease)    Dr. Wynonia Lawman with Cards   History of gastric ulcer    2005    Hyperlipidemia    Hypertension    IBS (irritable bowel syndrome)    Leukopenia    with prev unremarkable heme eval ~2011   Migraine    Unsp w/o intract w/o status migrainosus   Osteoporosis 11/18/2004   Shingles  Trigeminal neuralgia 03/05/2012   Past Surgical History:  Procedure Laterality Date   ABDOMINAL HYSTERECTOMY  2002   CORONARY ARTERY BYPASS GRAFT  2002   LUMBAR SPINE SURGERY     Stress cardiolite  09/11/2008   Probably normal with breast attenuation noted but no evidence of ischemia   TONSILLECTOMY  ~ 1965   Social History:  reports that she has never smoked. She has never used smokeless tobacco. She reports that she does not drink alcohol and does not use drugs.  Allergies  Allergen Reactions   Amoxicillin-Pot  Clavulanate     REACTION: Rash on tongue   Azithromycin Other (See Comments)    diarrhea   Codeine Nausea Only   Delsym [Dextromethorphan]     GI upset.   Doxycycline     GI intolerance   Ibandronate Sodium     REACTION: GI side effects   Iohexol      Desc: HIVES,NASAL CONGESTION DURING A CARDIAC CATH. REQUIRES PRE-MEDS.    Raloxifene     REACTION: GI upset   Sulfonamide Derivatives     REACTION: itching   Zoledronic Acid     REACTION: unable to take this as she was intolerant of pre-tx calcium and vitamin D   Iodinated Contrast Media Rash    Family History  Problem Relation Age of Onset   Arthritis Mother    Diabetes Mother    Hypertension Mother    Dementia Mother    Stroke Father    Heart disease Father        MI   Diabetes Father    Hypertension Father    Colon cancer Neg Hx    Breast cancer Neg Hx     Prior to Admission medications   Medication Sig Start Date End Date Taking? Authorizing Provider  amLODipine (NORVASC) 5 MG tablet Take 2 tablets (10 mg total) by mouth daily. Patient taking differently: Take 5 mg by mouth daily. 08/31/21  Yes Joaquim Nam, MD  aspirin 81 MG tablet Take 81 mg by mouth daily.   Yes [provider]  carbamazepine (TEGRETOL) 200 MG tablet TAKE 2 TABLETS IN THE MORNING AND 2 TABLETS AT NIGHT 06/25/21  Yes Joaquim Nam, MD  dexlansoprazole (DEXILANT) 60 MG capsule Take 60 mg by mouth daily.   Yes [provider]  ezetimibe (ZETIA) 10 MG tablet TAKE 1 TABLET EVERY DAY 08/18/21  Yes Lyn Records, MD  famotidine (PEPCID) 20 MG tablet Take 1 tablet (20 mg total) by mouth 2 (two) times daily. 07/09/21  Yes Joaquim Nam, MD  gabapentin (NEURONTIN) 100 MG capsule Take 1 capsule (100 mg total) by mouth daily as needed (for cough). 11/15/21  Yes Joaquim Nam, MD  hydrochlorothiazide (HYDRODIURIL) 25 MG tablet Take 1 tablet (25 mg total) by mouth daily. 07/15/21  Yes Joaquim Nam, MD  levocetirizine (XYZAL) 5 MG  tablet Take 5 mg by mouth daily.   Yes [provider]  metoprolol tartrate (LOPRESSOR) 50 MG tablet Take 0.5 tablets (25 mg total) by mouth 2 (two) times daily. 11/15/21  Yes Joaquim Nam, MD  ondansetron (ZOFRAN) 8 MG tablet Take 8 mg by mouth 3 (three) times daily as needed for nausea or vomiting. 01/01/22  Yes [provider]  promethazine (PHENERGAN) 12.5 MG suppository Place 12.5 mg rectally every 6 (six) hours as needed for nausea or vomiting. 01/01/22  Yes [provider]  rosuvastatin (CRESTOR) 40 MG tablet Take 1 tablet (40 mg  total) by mouth daily. 08/27/21 08/28/22 Yes Swinyer, Lanice Schwab, NP  spironolactone (ALDACTONE) 25 MG tablet TAKE 1 TABLET BY MOUTH EVERY DAY 08/19/21  Yes Belva Crome, MD  traZODone (DESYREL) 50 MG tablet Take 0.5-1 tablets (25-50 mg total) by mouth at bedtime as needed for sleep. Patient taking differently: Take 50 mg by mouth at bedtime. 10/08/21  Yes Tonia Ghent, MD  benzonatate (TESSALON PERLES) 100 MG capsule 1-2 capsules up to twice daily as needed for cough. Patient not taking: Reported on 01/18/2022 12/28/21   Lucretia Kern, DO  HYDROcodone-acetaminophen (HYCET) 7.5-325 mg/15 ml solution Take 10 mLs by mouth every 6 (six) hours as needed (cough). Patient not taking: Reported on 01/18/2022 01/07/22 01/07/23  Davonna Belling, MD    Physical Exam: Vitals:   01/23/22 0818 01/23/22 0828 01/23/22 0901 01/23/22 0915  BP: (!) 171/93 (!) 173/108 (!) 148/67 (!) 161/86  Pulse: 63  87 86  Resp: 17     Temp: 98.2 F (36.8 C)     TempSrc: Oral     SpO2: 97% 97%    Weight:      Height:       General:  Appears nauseated with recurrent emesis while I was in the room Eyes:   EOMI, normal lids, iris ENT:  grossly normal hearing, lips & tongue, mmm Neck:  no LAD, masses or thyromegaly Cardiovascular:  RRR, no m/r/g. No LE edema.  Respiratory:   CTA bilaterally with no wheezes/rales/rhonchi.  Normal respiratory effort. Abdomen:   soft, NT, ND Skin:  no rash or induration seen on limited exam Musculoskeletal:  grossly normal tone BUE/BLE, good ROM, no bony abnormality Psychiatric: blunted mood and affect, speech fluent and appropriate, AOx3 Neurologic:  CN 2-12 grossly intact, moves all extremities in coordinated fashion   Radiological Exams on Admission: Independently reviewed - see discussion in A/P where applicable  No results found.  EKG: Independently reviewed.  NSR with rate 88; LBBB with NSCSLT    Labs on Admission: I have personally reviewed the available labs and imaging studies at the time of the admission.  Pertinent labs:     Glucose 107 WBC 1.5 Hgb 8.9 Platelets 96   Family Communication: Sister was present throughout Biochemist, clinical team communication: I spoke with the neurosurgery NP at the time of consult request  Thank you very much for involving [place] in the care of your patient.  Author: Karmen Bongo, MD 01/23/2022 11:16 AM  For on call review [email or web address].

## 2022-01-23 NOTE — Progress Notes (Addendum)
Mrs Streater c/o dizzyness and nausea, onset during walk. she said the same thing happened yesterday because her BP dropped too fast. BP 143/75, hr 87. I gave Hydralazine 30 min ago for BP 173/108, HR 62. she still feels nauseated and dizzy and now c/o CP 5/10. Megan Burgman NPnotified.

## 2022-01-24 ENCOUNTER — Inpatient Hospital Stay (HOSPITAL_COMMUNITY): Payer: Medicare Other

## 2022-01-24 DIAGNOSIS — S066X1A Traumatic subarachnoid hemorrhage with loss of consciousness of 30 minutes or less, initial encounter: Secondary | ICD-10-CM | POA: Diagnosis not present

## 2022-01-24 LAB — GLUCOSE, CAPILLARY
Glucose-Capillary: 110 mg/dL — ABNORMAL HIGH (ref 70–99)
Glucose-Capillary: 134 mg/dL — ABNORMAL HIGH (ref 70–99)

## 2022-01-24 MED ORDER — HYDRALAZINE HCL 20 MG/ML IJ SOLN
10.0000 mg | INTRAMUSCULAR | Status: DC | PRN
  Filled 2022-01-24: qty 1

## 2022-01-24 MED ORDER — PANTOPRAZOLE SODIUM 40 MG PO TBEC
40.0000 mg | DELAYED_RELEASE_TABLET | Freq: Two times a day (BID) | ORAL | Status: DC
  Administered 2022-01-24 – 2022-01-28 (×9): 40 mg via ORAL
  Filled 2022-01-24 (×9): qty 1

## 2022-01-24 MED ORDER — DOCUSATE SODIUM 100 MG PO CAPS
100.0000 mg | ORAL_CAPSULE | Freq: Two times a day (BID) | ORAL | Status: DC
  Administered 2022-01-24 – 2022-01-28 (×6): 100 mg via ORAL
  Filled 2022-01-24 (×8): qty 1

## 2022-01-24 MED ORDER — POLYETHYLENE GLYCOL 3350 17 G PO PACK
17.0000 g | PACK | Freq: Every day | ORAL | Status: DC
  Administered 2022-01-24 – 2022-01-28 (×3): 17 g via ORAL
  Filled 2022-01-24 (×4): qty 1

## 2022-01-24 MED ORDER — METOPROLOL TARTRATE 5 MG/5ML IV SOLN: 5.0000 mg | INTRAVENOUS | Status: DC | PRN

## 2022-01-24 MED ORDER — AMLODIPINE BESYLATE 10 MG PO TABS
10.0000 mg | ORAL_TABLET | Freq: Every day | ORAL | Status: DC
  Administered 2022-01-25 – 2022-01-28 (×4): 10 mg via ORAL
  Filled 2022-01-24 (×3): qty 1

## 2022-01-24 MED ORDER — DEXTROSE-NACL 5-0.45 % IV SOLN: INTRAVENOUS | Status: AC

## 2022-01-24 NOTE — Progress Notes (Signed)
Overall somewhat better.  Still with headache but this is reasonably well-controlled.  With intermittent nausea vomiting.  Did have 1 small bowel movement today.  Oral intake continues to be marginal.  Afebrile.  Vital signs are reasonably stable.  Denies chest pain.  No shortness of breath.  Abdomen soft.  Awake and alert.  Oriented and appropriate.  Motor and sensory function intact.  Status post fall with significant traumatic subarachnoid hemorrhage.  Patient continues to slowly improve following her traumatic brain injury.  Patient also with significant nausea and vomiting which predates her fall.  This is slowly resolving.  Continue efforts at bowel regimen.  Continue efforts at mobilization.  Okay for rehab when bed available.

## 2022-01-24 NOTE — Progress Notes (Signed)
Inpatient Rehab Admissions Coordinator:  Saw pt and family at bedside. Discussed that pt is progressing well with PT. Noted pt was able to walk 120' with supervision. PT is recommneding [place] therapy. Do not feel pt warrants 3 hours of intensive rehab. Pt and family are in agreement. TOC made aware. AC will sign off.   Gayland Curry, [place], [place] Admissions Coordinator [phone]

## 2022-01-24 NOTE — TOC Initial Note (Signed)
Transition of Care [facility]) - Initial/Assessment Note    Patient Details  Name: Karen Saunders MRN: [id number] Date of Birth: 1955/01/14  [facility]) CM/SW Contact:    Bartholomew Crews, RN Phone Number: [phone] 01/24/2022, 12:20 PM  Clinical Narrative:                  Spoke with patient on her cell phone to discuss post acute transition. Patient agreeable to [facility] PT/OT. Offered choice of [place] agencies. No preference. Referral accepted by [facility]. Patient will need [facility] PT/OT Face to Face orders. TOC following for transition needs.   Expected Discharge Plan: [place] Barriers to Discharge: Continued Medical Work up   Patient Goals and CMS Choice Patient states their goals for this hospitalization and ongoing recovery are:: return home CMS Medicare.gov Compare Post Acute Care list provided to:: Patient Choice offered to / list presented to : Patient      Expected Discharge Plan and Services   Discharge Planning Services: CM Consult Post Acute Care Choice: [place] arrangements for the past 2 months: Single Family Home                 DME Arranged: N/A DME Agency: NA       HH Arranged: PT, OT [place] Agency: [place] (now known as [place]) Date [place]: 01/24/22 Time [place]: 1220 Representative spoke with at [place]: [place] Arrangements/Services Living arrangements for the past 2 months: [place] with:: Self Patient language and need for interpreter reviewed:: Yes Do you feel safe going back to the place where you live?: Yes      Need for Family Participation in Patient Care: Yes (Comment) Care giver support system in place?: Yes (comment)   Criminal Activity/Legal Involvement Pertinent to Current Situation/Hospitalization: No - Comment as needed  Activities of Daily Living      Permission Sought/Granted                  Emotional  Assessment Appearance:: Appears stated age Attitude/Demeanor/Rapport: Engaged Affect (typically observed): Accepting Orientation: : Oriented to Self, Oriented to Place, Oriented to  Time, Oriented to Situation Alcohol / Substance Use: Not Applicable Psych Involvement: No (comment)  Admission diagnosis:  Trauma [T14.90XA] Subarachnoid hemorrhage due to ruptured aneurysm (HCC) [I60.8] Traumatic subarachnoid bleed with LOC of 30 minutes or less, initial encounter ([place]) [S06.6X1A] Nausea and vomiting, unspecified vomiting type [R11.2] Patient Active Problem List   Diagnosis Date Noted   Nausea & vomiting 01/23/2022   Resistant hypertension 01/21/2022   Subarachnoid hemorrhage due to ruptured aneurysm ([place]) 01/20/2022   Traumatic subarachnoid bleed with LOC of 30 minutes or less, initial encounter ([place]) 01/19/2022   Trauma 01/18/2022   Urinary frequency 10/20/2021   Hyponatremia 10/20/2021   Upper airway cough syndrome 07/22/2021   Sinus bradycardia 06/27/2021   Osteopenia 05/19/2021   Insomnia 12/13/2018   Essential hypertension 01/30/2018   Hx of CABG 01/30/2018   Advance care planning 96/78/9381   Ear clicks 01/75/1025   Low back pain 07/23/2017   Occipital pain 04/20/2016   GERD (gastroesophageal reflux disease) 11/24/2015   Herniated nucleus pulposus, L3-4 right 11/24/2015   Lumbar spinal stenosis 11/24/2015   Vitiligo 01/23/2015   Right sided sciatica 08/04/2014   Sleep disorder 07/01/2014   Nocturnal cough 09/29/2013   Cough 09/13/2012   History of gastric ulcer    Cervical disc disease  Medicare annual wellness visit, initial 11/10/2010   Anemia 11/10/2010   Osteoporosis    Migraine headache    Hypertensive heart disease    Coronary atherosclerosis 01/12/2010   IBS 01/12/2010   HLD (hyperlipidemia)    PCP:  Tonia Ghent, MD Pharmacy:   [id number] - [place], [place] [address] Phone: [phone] Fax:  [phone]  [id number] [place], [address] [place] [address] Phone: [phone] Fax: [phone]  [facility] [place], [place] [address] [place] [address] Phone: [phone] Fax: [phone]  [place] [address] Phone: [phone] Fax: [phone]  [id number] [place], [address] [place] [address] Phone: [phone] Fax: [phone]     Social Determinants of Health ([place]) Social History: [place]: No Food Insecurity (12/08/2021)  Housing: Low Risk  (12/08/2021)  Transportation Needs: No Transportation Needs (12/08/2021)  Utilities: Not At Risk (12/08/2021)  Depression (PHQ2-9): Low Risk  (12/08/2021)  Financial Resource Strain: Low Risk  (12/08/2021)  Social Connections: Unknown (12/08/2021)  Stress: No Stress Concern Present (12/08/2021)  Tobacco Use: Low Risk  (01/18/2022)   SDOH Interventions:     Readmission Risk Interventions     No data to display

## 2022-01-24 NOTE — Progress Notes (Signed)
PROGRESS NOTE    Karen Saunders  KZS:[id number] DOB: 18-Mar-1955 DOA: 01/18/2022 PCP: Tonia Ghent, MD   Brief Narrative:  67 year old with history of CAD, HTN, HLD who had COVID about a month ago admitted to the hospital for traumatic Sub arachnoid hemorrhage after multiple falls.  She has been managed by neurosurgery but medical team was consulted for nausea and vomiting.   Assessment & Plan:  Principal Problem:   Traumatic subarachnoid bleed with LOC of 30 minutes or less, initial encounter ([place]) Active Problems:   Resistant hypertension   Nausea & vomiting   HLD (hyperlipidemia)   Hx of CABG     Assessment and Plan: * Traumatic subarachnoid bleed with LOC of 30 minutes or less, initial encounter ([place]) -Management per neurosurgery team.  Resistant hypertension -Continue current medications, increase Norvasc.  IV as needed ordered.  Constipation - Aggressive bowel regimen  Nausea & vomiting -Unclear etiology.  Tells me she vomits to solid and liquid food both.  Denies any abdominal pain.  Currently on PPI twice daily, Pepcid and Carafate.  She has not had bowel movement in many days, will order KUB.  Slowly advance diet as tolerated  Hx of CABG -No apparent chest pain -Continue ASA  HLD (hyperlipidemia) -Continue Crestor, Zetia        Subjective: Seen and examined at bedside.  Sister also present at bedside.  Tells me that she has had some nausea vomiting for several days now.  She has not had a bowel movement in a few days either.  She would like to try something more this morning   Examination:  General exam: Appears calm and comfortable  Respiratory system: Clear to auscultation. Respiratory effort normal. Cardiovascular system: S1 & S2 heard, RRR. No JVD, murmurs, rubs, gallops or clicks. No pedal edema. Gastrointestinal system: Abdomen is nondistended, soft and nontender. No organomegaly or masses felt. Normal bowel sounds heard. Central nervous  system: Alert and oriented. No focal neurological deficits. Extremities: Symmetric 5 x 5 power. Skin: No rashes, lesions or ulcers Psychiatry: Judgement and insight appear normal. Mood & affect appropriate.     Objective: Vitals:   01/23/22 1646 01/23/22 2032 01/24/22 0623 01/24/22 0820  BP: (!) 167/95 (!) 177/95 (!) 145/85 (!) 172/104  Pulse: 76 81 85 82  Resp:   20 17  Temp:  98.3 F (36.8 C) 98.4 F (36.9 C) 98.4 F (36.9 C)  TempSrc:  Oral Oral Oral  SpO2:  96% 98% 98%  Weight:      Height:        Intake/Output Summary (Last 24 hours) at 01/24/2022 1201 Last data filed at 01/24/2022 1007 Gross per 24 hour  Intake --  Output 750 ml  Net -750 ml   Filed Weights   01/19/22 2131 01/21/22 0108  Weight: 49 kg 49.7 kg     Data Reviewed:   CBC: Recent Labs  Lab 01/18/22 1928 01/20/22 0557 01/20/22 1313 01/23/22 1123  WBC 5.2 1.8* 1.5* 2.7*  NEUTROABS 4.7  --   --  2.1  HGB 11.1* 9.5* 8.9* 10.5*  HCT 31.4* 27.7* 26.1* 29.6*  MCV 88.7 90.8 92.6 88.4  PLT 133* 103* 96* 732*   Basic Metabolic Panel: Recent Labs  Lab 01/18/22 1928 01/20/22 0557 01/23/22 1123  NA 129* 139 135  K 3.9 3.8 3.3*  CL 91* 106 99  CO2 25 26 23   GLUCOSE 131* 107* 83  BUN 17 12 13   CREATININE 0.91 0.91 1.01*  CALCIUM 9.2  8.7* 9.0   GFR: Estimated Creatinine Clearance: 41.8 mL/min (A) (by C-G formula based on SCr of 1.01 mg/dL (H)). Liver Function Tests: Recent Labs  Lab 01/18/22 1928 01/23/22 1123  AST 40 60*  ALT 26 29  ALKPHOS 71 72  BILITOT 0.4 0.6  PROT 7.1 6.4*  ALBUMIN 4.1 3.6   Recent Labs  Lab 01/18/22 1928  LIPASE 38   No results for input(s): "AMMONIA" in the last 168 hours. Coagulation Profile: Recent Labs  Lab 01/18/22 2301  INR 1.0   Cardiac Enzymes: No results for input(s): "CKTOTAL", "CKMB", "CKMBINDEX", "TROPONINI" in the last 168 hours. BNP (last 3 results) No results for input(s): "PROBNP" in the last 8760 hours. HbA1C: No results for  input(s): "HGBA1C" in the last 72 hours. CBG: Recent Labs  Lab 01/18/22 1918  GLUCAP 131*   Lipid Profile: No results for input(s): "CHOL", "HDL", "LDLCALC", "TRIG", "CHOLHDL", "LDLDIRECT" in the last 72 hours. Thyroid Function Tests: No results for input(s): "TSH", "T4TOTAL", "FREET4", "T3FREE", "THYROIDAB" in the last 72 hours. Anemia Panel: No results for input(s): "VITAMINB12", "FOLATE", "FERRITIN", "TIBC", "IRON", "RETICCTPCT" in the last 72 hours. Sepsis Labs: No results for input(s): "PROCALCITON", "LATICACIDVEN" in the last 168 hours.  Recent Results (from the past 240 hour(s))  Resp panel by RT-PCR (RSV, Flu A&B, Covid) Anterior Nasal Swab     Status: None   Collection Time: 01/18/22  7:12 PM   Specimen: Anterior Nasal Swab  Result Value Ref Range Status   SARS Coronavirus 2 by RT PCR NEGATIVE NEGATIVE Final    Comment: (NOTE) SARS-CoV-2 target nucleic acids are NOT DETECTED.  The SARS-CoV-2 RNA is generally detectable in upper respiratory specimens during the acute phase of infection. The lowest concentration of SARS-CoV-2 viral copies this assay can detect is 138 copies/mL. A negative result does not preclude SARS-Cov-2 infection and should not be used as the sole basis for treatment or other patient management decisions. A negative result may occur with  improper specimen collection/handling, submission of specimen other than nasopharyngeal swab, presence of viral mutation(s) within the areas targeted by this assay, and inadequate number of viral copies(<138 copies/mL). A negative result must be combined with clinical observations, patient history, and epidemiological information. The expected result is Negative.  Fact Sheet for Patients:  BloggerCourse.com  Fact Sheet for Healthcare Providers:  SeriousBroker.it  This test is no t yet approved or cleared by the [place] FDA and  has been authorized for  detection and/or diagnosis of SARS-CoV-2 by FDA under an Emergency Use Authorization (EUA). This EUA will remain  in effect (meaning this test can be used) for the duration of the COVID-19 declaration under Section 564(b)(1) of the Act, 21 U.S.C.section 360bbb-3(b)(1), unless the authorization is terminated  or revoked sooner.       Influenza A by PCR NEGATIVE NEGATIVE Final   Influenza B by PCR NEGATIVE NEGATIVE Final    Comment: (NOTE) The Xpert Xpress SARS-CoV-2/FLU/RSV plus assay is intended as an aid in the diagnosis of influenza from Nasopharyngeal swab specimens and should not be used as a sole basis for treatment. Nasal washings and aspirates are unacceptable for Xpert Xpress SARS-CoV-2/FLU/RSV testing.  Fact Sheet for Patients: BloggerCourse.com  Fact Sheet for Healthcare Providers: SeriousBroker.it  This test is not yet approved or cleared by the [place] FDA and has been authorized for detection and/or diagnosis of SARS-CoV-2 by FDA under an Emergency Use Authorization (EUA). This EUA will remain in effect (meaning this test can be  used) for the duration of the COVID-19 declaration under Section 564(b)(1) of the Act, 21 U.S.C. section 360bbb-3(b)(1), unless the authorization is terminated or revoked.     Resp Syncytial Virus by PCR NEGATIVE NEGATIVE Final    Comment: (NOTE) Fact Sheet for Patients: EntrepreneurPulse.com.au  Fact Sheet for Healthcare Providers: IncredibleEmployment.be  This test is not yet approved or cleared by the [place] FDA and has been authorized for detection and/or diagnosis of SARS-CoV-2 by FDA under an Emergency Use Authorization (EUA). This EUA will remain in effect (meaning this test can be used) for the duration of the COVID-19 declaration under Section 564(b)(1) of the Act, 21 U.S.C. section 360bbb-3(b)(1), unless the authorization is  terminated or revoked.  Performed at [facility], [place] [address], [place], [place] [address]   MRSA Next Gen by PCR, Nasal     Status: None   Collection Time: 01/21/22  1:11 AM   Specimen: Nasal Mucosa; Nasal Swab  Result Value Ref Range Status   MRSA by PCR Next Gen NOT DETECTED NOT DETECTED Final    Comment: (NOTE) The GeneXpert MRSA Assay (FDA approved for NASAL specimens only), is one component of a comprehensive MRSA colonization surveillance program. It is not intended to diagnose MRSA infection nor to guide or monitor treatment for MRSA infections. Test performance is not FDA approved in patients less than 5 years old. Performed at [facility], [place] [address], [place], [place] [address]          Radiology Studies: DG Abd 1 View  Result Date: 01/24/2022 CLINICAL DATA:  67 year old female with nausea and vomiting. EXAM: ABDOMEN - 1 VIEW COMPARISON:  CT Abdomen and Pelvis 01/18/2022. FINDINGS: Supine views at 1130 hours. Bowel gas pattern nonobstructed, not significantly changed from the recent CT. Skin fold artifact suspected over the right mid abdomen. Stable abdominal and pelvic visceral contours. Levoconvex lumbar scoliosis with disc and endplate degeneration. Pelvic vascular calcifications. No acute osseous abnormality identified. IMPRESSION: Nonobstructed bowel-gas pattern.  No acute radiographic finding. Electronically Signed   By: Genevie Ann M.D.   On: 01/24/2022 11:43        Scheduled Meds:  amLODipine  5 mg Oral Daily   carbamazepine  400 mg Oral BID   Chlorhexidine Gluconate Cloth  6 each Topical Daily   ezetimibe  10 mg Oral Daily   famotidine  20 mg Oral BID   hydrochlorothiazide  25 mg Oral Daily   loratadine  10 mg Oral Daily   metoprolol tartrate  25 mg Oral BID   pantoprazole  40 mg Oral BID   rosuvastatin  40 mg Oral Daily   spironolactone  25 mg Oral Daily   sucralfate  1 g Oral TID WC & HS   traZODone  50 mg Oral QHS    Continuous Infusions:  dextrose 5 % and 0.45% NaCl 50 mL/hr at 01/24/22 1146     LOS: 6 days   Time spent= 35 mins    Macon Sandiford Arsenio Loader, MD [facility]  If 7PM-7AM, please contact night-coverage  01/24/2022, 12:01 PM

## 2022-01-24 NOTE — Progress Notes (Signed)
Mobility Specialist - Progress Note   01/24/22 1300  Mobility  Activity Ambulated with assistance in hallway  Level of Assistance Contact guard assist, steadying assist  Assistive Device Front wheel walker  Distance Ambulated (ft) 150 ft  Activity Response Tolerated well  Mobility Referral Yes  $Mobility charge 1 Mobility    Pt received sitting EOB agreeable to mobility. MinG assist d/t c/o nervousness w/ hallway ambulation. Left sitting EOB w/ all needs met.   [place] Specialist Please contact via SecureChat or Rehab office at [phone]

## 2022-01-24 NOTE — Progress Notes (Signed)
PT Cancellation Note  Patient Details Name: Karen Saunders MRN: [id number] DOB: 1955/07/31   Cancelled Treatment:    Reason Eval/Treat Not Completed: Pt just received nausea medication and politely declines PT at this time. Sister present who reports pt has been ambulating to the bathroom with her or nursing staff. Will continue to follow and progress as able per POC.    Thelma Comp 01/24/2022, 3:18 PM  Rolinda Roan, PT, DPT Acute Rehabilitation Services Secure Chat Preferred Office: [phone]

## 2022-01-25 ENCOUNTER — Inpatient Hospital Stay (HOSPITAL_COMMUNITY): Payer: Medicare Other

## 2022-01-25 DIAGNOSIS — S066X1A Traumatic subarachnoid hemorrhage with loss of consciousness of 30 minutes or less, initial encounter: Secondary | ICD-10-CM | POA: Diagnosis not present

## 2022-01-25 LAB — GLUCOSE, CAPILLARY
Glucose-Capillary: 106 mg/dL — ABNORMAL HIGH (ref 70–99)
Glucose-Capillary: 108 mg/dL — ABNORMAL HIGH (ref 70–99)
Glucose-Capillary: 120 mg/dL — ABNORMAL HIGH (ref 70–99)

## 2022-01-25 MED ORDER — METOCLOPRAMIDE HCL 5 MG/ML IJ SOLN
5.0000 mg | Freq: Three times a day (TID) | INTRAMUSCULAR | Status: DC
  Administered 2022-01-25 – 2022-01-28 (×10): 5 mg via INTRAVENOUS
  Filled 2022-01-25 (×10): qty 2

## 2022-01-25 NOTE — Progress Notes (Addendum)
PROGRESS NOTE    Karen Saunders  HLK:[id number] DOB: [birth year] DOA: 01/18/2022 PCP: Tonia Ghent, MD   Brief Narrative:  67 year old with history of CAD, HTN, HLD who had COVID about a month ago admitted to the hospital for traumatic Sub arachnoid hemorrhage after multiple falls.  She has been managed by neurosurgery but medical team was consulted for nausea and vomiting.  Off-and-on having nausea but was able to tolerate full liquid diet.  KUB shows nonobstructive pattern.  Advanced her to soft diet   Assessment & Plan:  Principal Problem:   Traumatic subarachnoid bleed with LOC of 30 minutes or less, initial encounter ([place]) Active Problems:   Resistant hypertension   Nausea & vomiting   HLD (hyperlipidemia)   Hx of CABG     Assessment and Plan: * Traumatic subarachnoid bleed with LOC of 30 minutes or less, initial encounter ([place]) -Management per neurosurgery team.  Resistant hypertension -Continue current medications, increase Norvasc.  IV as needed ordered.  Constipation - Aggressive bowel regimen  Nausea & vomiting - Some of these issues appears to be chronic.  Unclear etiology.  Denies any abdominal pain.  Does have good bowel sounds this morning..  Currently on PPI twice daily, Pepcid and Carafate.  KUB shows nonobstructive bowel/gas pattern.  Will continue antiemetics.  Advance to soft diet.   Addendum Still having some nausea and vomiting again later this morning.  Unlikely a candidate for endoscopy at this point given subarachnoid hemorrhage but for now we will start with esophagram. We can try scheduled Reglan.  Recommend outpatient gastric emptying study   Hx of CABG -No apparent chest pain -Continue ASA  HLD (hyperlipidemia) -Continue Crestor, Zetia    Subjective: Seen and examined at bedside, tells me that she was able to tolerate full liquid diet without any issues.  Off-and-on does have still symptoms of nausea but willing to try little bit more  today.  Examination:  Constitutional: Not in acute distress Respiratory: Clear to auscultation bilaterally Cardiovascular: Normal sinus rhythm, no rubs Abdomen: Nontender nondistended good bowel sounds Musculoskeletal: No edema noted Skin: No rashes seen Neurologic: CN 2-12 grossly intact.  And nonfocal Psychiatric: Normal judgment and insight. Alert and oriented x 3. Normal mood.  Objective: Vitals:   01/24/22 1554 01/24/22 2059 01/25/22 0427 01/25/22 0739  BP: (!) 172/104 (!) 169/96 (!) 165/108 (!) 164/106  Pulse: 63 63 62 (!) 58  Resp: 16 17 16 17   Temp: 98.5 F (36.9 C) 98.6 F (37 C) 98.6 F (37 C) 98.6 F (37 C)  TempSrc: Oral Oral Oral Oral  SpO2: 98% 100% 96% 100%  Weight:      Height:        Intake/Output Summary (Last 24 hours) at 01/25/2022 1012 Last data filed at 01/25/2022 0010 Gross per 24 hour  Intake 278.96 ml  Output 601 ml  Net -322.04 ml   Filed Weights   01/19/22 2131 01/21/22 0108  Weight: 49 kg 49.7 kg     Data Reviewed:   CBC: Recent Labs  Lab 01/18/22 1928 01/20/22 0557 01/20/22 1313 01/23/22 1123  WBC 5.2 1.8* 1.5* 2.7*  NEUTROABS 4.7  --   --  2.1  HGB 11.1* 9.5* 8.9* 10.5*  HCT 31.4* 27.7* 26.1* 29.6*  MCV 88.7 90.8 92.6 88.4  PLT 133* 103* 96* 734*   Basic Metabolic Panel: Recent Labs  Lab 01/18/22 1928 01/20/22 0557 01/23/22 1123  NA 129* 139 135  K 3.9 3.8 3.3*  CL 91* 106  99  CO2 25 26 23   GLUCOSE 131* 107* 83  BUN 17 12 13   CREATININE 0.91 0.91 1.01*  CALCIUM 9.2 8.7* 9.0   GFR: Estimated Creatinine Clearance: 41.8 mL/min (A) (by C-G formula based on SCr of 1.01 mg/dL (H)). Liver Function Tests: Recent Labs  Lab 01/18/22 1928 01/23/22 1123  AST 40 60*  ALT 26 29  ALKPHOS 71 72  BILITOT 0.4 0.6  PROT 7.1 6.4*  ALBUMIN 4.1 3.6   Recent Labs  Lab 01/18/22 1928  LIPASE 38   No results for input(s): "AMMONIA" in the last 168 hours. Coagulation Profile: Recent Labs  Lab 01/18/22 2301  INR 1.0    Cardiac Enzymes: No results for input(s): "CKTOTAL", "CKMB", "CKMBINDEX", "TROPONINI" in the last 168 hours. BNP (last 3 results) No results for input(s): "PROBNP" in the last 8760 hours. HbA1C: No results for input(s): "HGBA1C" in the last 72 hours. CBG: Recent Labs  Lab 01/18/22 1918 01/24/22 1551 01/24/22 2056 01/25/22 0025 01/25/22 0740  GLUCAP 131* 110* 134* 120* 106*   Lipid Profile: No results for input(s): "CHOL", "HDL", "LDLCALC", "TRIG", "CHOLHDL", "LDLDIRECT" in the last 72 hours. Thyroid Function Tests: No results for input(s): "TSH", "T4TOTAL", "FREET4", "T3FREE", "THYROIDAB" in the last 72 hours. Anemia Panel: No results for input(s): "VITAMINB12", "FOLATE", "FERRITIN", "TIBC", "IRON", "RETICCTPCT" in the last 72 hours. Sepsis Labs: No results for input(s): "PROCALCITON", "LATICACIDVEN" in the last 168 hours.  Recent Results (from the past 240 hour(s))  Resp panel by RT-PCR (RSV, Flu A&B, Covid) Anterior Nasal Swab     Status: None   Collection Time: 01/18/22  7:12 PM   Specimen: Anterior Nasal Swab  Result Value Ref Range Status   SARS Coronavirus 2 by RT PCR NEGATIVE NEGATIVE Final    Comment: (NOTE) SARS-CoV-2 target nucleic acids are NOT DETECTED.  The SARS-CoV-2 RNA is generally detectable in upper respiratory specimens during the acute phase of infection. The lowest concentration of SARS-CoV-2 viral copies this assay can detect is 138 copies/mL. A negative result does not preclude SARS-Cov-2 infection and should not be used as the sole basis for treatment or other patient management decisions. A negative result may occur with  improper specimen collection/handling, submission of specimen other than nasopharyngeal swab, presence of viral mutation(s) within the areas targeted by this assay, and inadequate number of viral copies(<138 copies/mL). A negative result must be combined with clinical observations, patient history, and  epidemiological information. The expected result is Negative.  Fact Sheet for Patients:  01/27/22  Fact Sheet for Healthcare Providers:  03/19/22  This test is no t yet approved or cleared by the [employer] FDA and  has been authorized for detection and/or diagnosis of SARS-CoV-2 by FDA under an Emergency Use Authorization (EUA). This EUA will remain  in effect (meaning this test can be used) for the duration of the COVID-19 declaration under Section 564(b)(1) of the Act, 21 U.S.C.section 360bbb-3(b)(1), unless the authorization is terminated  or revoked sooner.       Influenza A by PCR NEGATIVE NEGATIVE Final   Influenza B by PCR NEGATIVE NEGATIVE Final    Comment: (NOTE) The Xpert Xpress SARS-CoV-2/FLU/RSV plus assay is intended as an aid in the diagnosis of influenza from Nasopharyngeal swab specimens and should not be used as a sole basis for treatment. Nasal washings and aspirates are unacceptable for Xpert Xpress SARS-CoV-2/FLU/RSV testing.  Fact Sheet for Patients: SeriousBroker.it  Fact Sheet for Healthcare Providers: [place]  This test is not yet approved  or cleared by the [place] and has been authorized for detection and/or diagnosis of SARS-CoV-2 by FDA under an Emergency Use Authorization (EUA). This EUA will remain in effect (meaning this test can be used) for the duration of the COVID-19 declaration under Section 564(b)(1) of the Act, 21 U.S.C. section 360bbb-3(b)(1), unless the authorization is terminated or revoked.     Resp Syncytial Virus by PCR NEGATIVE NEGATIVE Final    Comment: (NOTE) Fact Sheet for Patients: EntrepreneurPulse.com.au  Fact Sheet for Healthcare Providers: IncredibleEmployment.be  This test is not yet approved or cleared by the [place] FDA and has been  authorized for detection and/or diagnosis of SARS-CoV-2 by FDA under an Emergency Use Authorization (EUA). This EUA will remain in effect (meaning this test can be used) for the duration of the COVID-19 declaration under Section 564(b)(1) of the Act, 21 U.S.C. section 360bbb-3(b)(1), unless the authorization is terminated or revoked.  Performed at [facility], [place] [address] [place], [place] [address]   MRSA Next Gen by PCR, Nasal     Status: None   Collection Time: 01/21/22  1:11 AM   Specimen: Nasal Mucosa; Nasal Swab  Result Value Ref Range Status   MRSA by PCR Next Gen NOT DETECTED NOT DETECTED Final    Comment: (NOTE) The GeneXpert MRSA Assay (FDA approved for NASAL specimens only), is one component of a comprehensive MRSA colonization surveillance program. It is not intended to diagnose MRSA infection nor to guide or monitor treatment for MRSA infections. Test performance is not FDA approved in patients less than 47 years old. Performed at [facility], [place] [address] [place], [place] [address]          Radiology Studies: DG Abd 1 View  Result Date: 01/24/2022 CLINICAL DATA:  67 year old female with nausea and vomiting. EXAM: ABDOMEN - 1 VIEW COMPARISON:  CT Abdomen and Pelvis 01/18/2022. FINDINGS: Supine views at 1130 hours. Bowel gas pattern nonobstructed, not significantly changed from the recent CT. Skin fold artifact suspected over the right mid abdomen. Stable abdominal and pelvic visceral contours. Levoconvex lumbar scoliosis with disc and endplate degeneration. Pelvic vascular calcifications. No acute osseous abnormality identified. IMPRESSION: Nonobstructed bowel-gas pattern.  No acute radiographic finding. Electronically Signed   By: Genevie Ann M.D.   On: 01/24/2022 11:43        Scheduled Meds:  amLODipine  10 mg Oral Daily   carbamazepine  400 mg Oral BID   Chlorhexidine Gluconate Cloth  6 each Topical Daily   docusate sodium  100  mg Oral BID   ezetimibe  10 mg Oral Daily   famotidine  20 mg Oral BID   hydrochlorothiazide  25 mg Oral Daily   loratadine  10 mg Oral Daily   metoprolol tartrate  25 mg Oral BID   pantoprazole  40 mg Oral BID   polyethylene glycol  17 g Oral Daily   rosuvastatin  40 mg Oral Daily   spironolactone  25 mg Oral Daily   sucralfate  1 g Oral TID WC & HS   traZODone  50 mg Oral QHS   Continuous Infusions:  dextrose 5 % and 0.45% NaCl 50 mL/hr at 01/25/22 0748     LOS: 7 days   Time spent= 35 mins    Joshus Rogan Arsenio Loader, MD [facility]  If 7PM-7AM, please contact night-coverage  01/25/2022, 10:12 AM

## 2022-01-25 NOTE — Care Management Important Message (Signed)
Important Message  Patient Details  Name: Karen Saunders MRN: [id number] Date of Birth: 07-Oct-1955   Medicare Important Message Given:  Yes     Hannah Beat 01/25/2022, 3:47 PM

## 2022-01-25 NOTE — Progress Notes (Signed)
Physical Therapy Treatment Patient Details Name: Karen Saunders MRN: [id number] DOB: [birth year] Today's Date: 01/25/2022   History of Present Illness Pt is a 67 y/o female admitted with traumatic SAH s/p falls at home 2 diarrhea and dehydration. PMH significant for anemia, CAD, gastic ulcer, HTN, IBS, leukopenia, migraines, Osteoporosis, trigeminal neuralgia    PT Comments    Pt progressing towards physical therapy goals. Agreeable to session today despite continued nausea. Pt was able to ambulate in the hallway however vomited upon return to room. RN aware. Continue to feel HHPT is appropriate to maximize functional independence, safety, and return to PLOF. Sister present and supportive throughout session.    Recommendations for follow up therapy are one component of a multi-disciplinary discharge planning process, led by the attending physician.  Recommendations may be updated based on patient status, additional functional criteria and insurance authorization.  Follow Up Recommendations  Home health PT (with sister support)     Assistance Recommended at Discharge Intermittent Supervision/Assistance  Patient can return home with the following A little help with walking and/or transfers;A little help with bathing/dressing/bathroom;Assistance with cooking/housework;Assist for transportation;Help with stairs or ramp for entrance   Equipment Recommendations  Rolling walker (2 wheels)    Recommendations for Other Services       Precautions / Restrictions Precautions Precautions: Fall Precaution Comments: Frequently vomiting Restrictions Weight Bearing Restrictions: No     Mobility  Bed Mobility               General bed mobility comments: Pt was received exiting the bathroom at start of session    Transfers Overall transfer level: Needs assistance Equipment used: None Transfers: Sit to/from Stand Sit to Stand: Supervision           General transfer comment:  Supervision for safety. Pt required cues to back up fully to EOB or chair and bring RW with her prior to initiating stand>sit. Pt holding to walker and "plopping" down to sit despite cues to reach back for seated surface for safety.    Ambulation/Gait Ambulation/Gait assistance: Supervision, Min guard Gait Distance (Feet): 120 Feet Assistive device: Rolling walker (2 wheels) Gait Pattern/deviations: Step-through pattern, Decreased stride length, Trunk flexed Gait velocity: Decreased Gait velocity interpretation: <1.31 ft/sec, indicative of household ambulator   General Gait Details: cues for upright posture, placement in RW. Attempted gait without UE support, pt with posterior bias and weaving of gait without AD   Stairs             Wheelchair Mobility    Modified Rankin (Stroke Patients Only)       Balance Overall balance assessment: Needs assistance Sitting-balance support: Bilateral upper extremity supported, Feet supported Sitting balance-Leahy Scale: Fair     Standing balance support: Bilateral upper extremity supported, During functional activity, Reliant on assistive device for balance Standing balance-Leahy Scale: Poor                              Cognition Arousal/Alertness: Awake/alert Behavior During Therapy: WFL for tasks assessed/performed Overall Cognitive Status: Impaired/Different from baseline Area of Impairment: Safety/judgement                         Safety/Judgement: Decreased awareness of safety, Decreased awareness of deficits              Exercises      General Comments        Pertinent  Vitals/Pain Pain Assessment Pain Assessment: Faces Faces Pain Scale: No hurt Pain Intervention(s): Monitored during session    Home Living                          Prior Function            PT Goals (current goals can now be found in the care plan section) Acute Rehab PT Goals Patient Stated Goal: Feel  better PT Goal Formulation: With patient Time For Goal Achievement: 02/04/22 Potential to Achieve Goals: Good Progress towards PT goals: Progressing toward goals    Frequency    Min 4X/week      PT Plan Current plan remains appropriate    Co-evaluation              AM-PAC PT "6 Clicks" Mobility   Outcome Measure  Help needed turning from your back to your side while in a flat bed without using bedrails?: A Little Help needed moving from lying on your back to sitting on the side of a flat bed without using bedrails?: A Little Help needed moving to and from a bed to a chair (including a wheelchair)?: A Little Help needed standing up from a chair using your arms (e.g., wheelchair or bedside chair)?: A Little Help needed to walk in hospital room?: A Little Help needed climbing 3-5 steps with a railing? : A Little 6 Click Score: 18    End of Session Equipment Utilized During Treatment: Gait belt Activity Tolerance: Patient tolerated treatment well;Other (comment) (limited by nausea) Patient left: in chair;with call bell/phone within reach;with family/visitor present Nurse Communication: Mobility status PT Visit Diagnosis: Unsteadiness on feet (R26.81);Other symptoms and signs involving the nervous system (Q22.979)     Time: 8921-1941 PT Time Calculation (min) (ACUTE ONLY): 17 min  Charges:  $Gait Training: 8-22 mins                     Karen Saunders, PT, DPT Acute Rehabilitation Services Secure Chat Preferred Office: [phone]    Karen Saunders 01/25/2022, 1:30 PM

## 2022-01-25 NOTE — Plan of Care (Signed)
  Problem: Clinical Measurements: Goal: Ability to maintain clinical measurements within normal limits will improve Outcome: Progressing Goal: Will remain free from infection Outcome: Progressing Goal: Diagnostic test results will improve Outcome: Progressing Goal: Respiratory complications will improve Outcome: Progressing Goal: Cardiovascular complication will be avoided Outcome: Progressing   Problem: Activity: Goal: Risk for activity intolerance will decrease Outcome: Progressing   Problem: Nutrition: Goal: Adequate nutrition will be maintained Outcome: Progressing   Problem: Coping: Goal: Level of anxiety will decrease Outcome: Progressing   Problem: Elimination: Goal: Will not experience complications related to bowel motility Outcome: Progressing Goal: Will not experience complications related to urinary retention Outcome: Progressing   Problem: Safety: Goal: Ability to remain free from injury will improve Outcome: Progressing   Problem: Skin Integrity: Goal: Risk for impaired skin integrity will decrease Outcome: Progressing   Problem: Pain Managment: Goal: General experience of comfort will improve Outcome: Not Progressing  PT continues to complain of severe headache norco given x 1 and dilaudid x 1. Neurotin given for cough

## 2022-01-25 NOTE — TOC Progression Note (Signed)
Transition of Care (TOC) - Progression Note   Asked attending for home health orders , confirmed with Hoyle Sauer with [facility] she can accept if receives orders  Patient Details  Name: Karen Saunders MRN: [id number] Date of Birth: 26-Oct-1955  Transition of Care [facility]) CM/SW Contact  Inessa Wardrop, Edson Snowball, RN Phone Number: 01/25/2022, 11:32 AM  Clinical Narrative:       Expected Discharge Plan: [place] Barriers to Discharge: Continued Medical Work up  Expected Discharge Plan and Services   Discharge Planning Services: CM Consult Post Acute Care Choice: [place] arrangements for the past 2 months: Single Family Home                 DME Arranged: N/A DME Agency: NA       HH Arranged: PT, OT [place] Agency: [place] (now known as [place]) Date [place]: 01/24/22 Time [place]: 1220 Representative spoke with at [place]: [place] Determinants of Health ([place]) Interventions [place]: No Food Insecurity (12/08/2021)  Housing: Low Risk  (12/08/2021)  Transportation Needs: No Transportation Needs (12/08/2021)  Utilities: Not At Risk (12/08/2021)  Depression (PHQ2-9): Low Risk  (12/08/2021)  Financial Resource Strain: Low Risk  (12/08/2021)  Social Connections: Unknown (12/08/2021)  Stress: No Stress Concern Present (12/08/2021)  Tobacco Use: Low Risk  (01/18/2022)    Readmission Risk Interventions     No data to display

## 2022-01-25 NOTE — Care Management Important Message (Signed)
Important Message  Patient Details  Name: Karen Saunders MRN: [id number] Date of Birth: [birth year]   Medicare Important Message Given:  Yes     Hannah Beat 01/25/2022, 3:46 PM

## 2022-01-25 NOTE — Progress Notes (Signed)
   Providing Compassionate, Quality Care - Together   Subjective: Patient reports severe nausea this morning.  Objective: Vital signs in last 24 hours: Temp:  [98.5 F (36.9 C)-98.6 F (37 C)] 98.6 F (37 C) (01/16 0739) Pulse Rate:  [58-63] 58 (01/16 0739) Resp:  [16-17] 17 (01/16 0739) BP: (164-172)/(96-108) 164/106 (01/16 0739) SpO2:  [96 %-100 %] 100 % (01/16 0739)  Intake/Output from previous day: 01/15 0701 - 01/16 0700 In: 279 [I.V.:279] Out: 1151 [Urine:1151] Intake/Output this shift: No intake/output data recorded.  Alert and oriented x 4 PERRLA Speech clear and fluent CN II-XII grossly intact MAE, Strength and sensation intact    Lab Results: Recent Labs    01/23/22 1123  WBC 2.7*  HGB 10.5*  HCT 29.6*  PLT 111*   BMET Recent Labs    01/23/22 1123  NA 135  K 3.3*  CL 99  CO2 23  GLUCOSE 83  BUN 13  CREATININE 1.01*  CALCIUM 9.0    Studies/Results: DG Abd 1 View  Result Date: 01/24/2022 CLINICAL DATA:  67 year old female with nausea and vomiting. EXAM: ABDOMEN - 1 VIEW COMPARISON:  CT Abdomen and Pelvis 01/18/2022. FINDINGS: Supine views at 1130 hours. Bowel gas pattern nonobstructed, not significantly changed from the recent CT. Skin fold artifact suspected over the right mid abdomen. Stable abdominal and pelvic visceral contours. Levoconvex lumbar scoliosis with disc and endplate degeneration. Pelvic vascular calcifications. No acute osseous abnormality identified. IMPRESSION: Nonobstructed bowel-gas pattern.  No acute radiographic finding. Electronically Signed   By: Genevie Ann M.D.   On: 01/24/2022 11:43    Assessment/Plan: Patient with traumatic SAH. N/V has been present for several days and it's not clear if it is related to the [facility]. Continue to encourage patient to sit upright in bed or chair. Encourage mobilization. Discussed patient with Dr. Reesa Chew. We are limited to the upper GI studies we can do with her recent traumatic SAH. Dr. Reesa Chew will  order an esophogram to further investigate for a source of Karen Saunders's persistent nausea. There are no new Neurosurgical recommendations.      LOS: 7 days      Viona Gilmore, DNP, AGNP-C Nurse Practitioner  [facility] [place] [address], [place], [place] [address] P: [phone]    F: [phone]  01/25/2022, 11:35 AM

## 2022-01-25 NOTE — Progress Notes (Signed)
OT Cancellation Note  Patient Details Name: Karen Saunders MRN: [id number] DOB: 1955-03-18   Cancelled Treatment:    Reason Eval/Treat Not Completed: Patient at procedure or test/ unavailable (Pt unavailable for OT treatment. Currenly performing esophagram. Will re-attempt when able.)  Ailene Ravel, OTR/L,CBIS  Supplemental OT - [place] and [facility] Secure Chat Preferred   01/25/2022, 2:29 PM

## 2022-01-26 ENCOUNTER — Other Ambulatory Visit: Payer: Self-pay | Admitting: Family Medicine

## 2022-01-26 DIAGNOSIS — I10 Essential (primary) hypertension: Secondary | ICD-10-CM | POA: Diagnosis not present

## 2022-01-26 DIAGNOSIS — R112 Nausea with vomiting, unspecified: Secondary | ICD-10-CM

## 2022-01-26 LAB — COMPREHENSIVE METABOLIC PANEL
ALT: 21 U/L (ref 0–44)
AST: 36 U/L (ref 15–41)
Albumin: 3.7 g/dL (ref 3.5–5.0)
Alkaline Phosphatase: 75 U/L (ref 38–126)
Anion gap: 12 (ref 5–15)
BUN: 10 mg/dL (ref 8–23)
CO2: 28 mmol/L (ref 22–32)
Calcium: 9 mg/dL (ref 8.9–10.3)
Chloride: 89 mmol/L — ABNORMAL LOW (ref 98–111)
Creatinine, Ser: 1.01 mg/dL — ABNORMAL HIGH (ref 0.44–1.00)
GFR, Estimated: >60 mL/min (ref >60)
Glucose, Bld: 88 mg/dL (ref 70–99)
Potassium: 3.2 mmol/L — ABNORMAL LOW (ref 3.5–5.1)
Sodium: 129 mmol/L — ABNORMAL LOW (ref 135–145)
Total Bilirubin: 0.3 mg/dL (ref 0.3–1.2)
Total Protein: 6.4 g/dL — ABNORMAL LOW (ref 6.5–8.1)

## 2022-01-26 LAB — GLUCOSE, CAPILLARY
Glucose-Capillary: 104 mg/dL — ABNORMAL HIGH (ref 70–99)
Glucose-Capillary: 83 mg/dL (ref 70–99)
Glucose-Capillary: 90 mg/dL (ref 70–99)
Glucose-Capillary: 94 mg/dL (ref 70–99)

## 2022-01-26 LAB — CBC
HCT: 29.1 % — ABNORMAL LOW (ref 36.0–46.0)
Hemoglobin: 10.7 g/dL — ABNORMAL LOW (ref 12.0–15.0)
MCH: 31.7 pg (ref 26.0–34.0)
MCHC: 36.8 g/dL — ABNORMAL HIGH (ref 30.0–36.0)
MCV: 86.1 fL (ref 80.0–100.0)
Platelets: 143 10*3/uL — ABNORMAL LOW (ref 150–400)
RBC: 3.38 MIL/uL — ABNORMAL LOW (ref 3.87–5.11)
RDW: 11.9 % (ref 11.5–15.5)
WBC: 2.3 10*3/uL — ABNORMAL LOW (ref 4.0–10.5)
nRBC: 0 % (ref 0.0–0.2)

## 2022-01-26 LAB — MAGNESIUM: Magnesium: 1.4 mg/dL — ABNORMAL LOW (ref 1.7–2.4)

## 2022-01-26 MED ORDER — MAGNESIUM SULFATE 4 GM/100ML IV SOLN
4.0000 g | Freq: Once | INTRAVENOUS | Status: AC
  Administered 2022-01-26: 4 g via INTRAVENOUS
  Filled 2022-01-26: qty 100

## 2022-01-26 MED ORDER — SODIUM CHLORIDE 0.9 % IV SOLN: INTRAVENOUS | Status: AC

## 2022-01-26 MED ORDER — BUTALBITAL-APAP-CAFFEINE 50-325-40 MG PO TABS
1.0000 | ORAL_TABLET | ORAL | Status: DC | PRN
  Administered 2022-01-27 – 2022-01-28 (×4): 1 via ORAL
  Filled 2022-01-26 (×5): qty 1

## 2022-01-26 MED ORDER — LISINOPRIL 20 MG PO TABS
20.0000 mg | ORAL_TABLET | Freq: Every day | ORAL | Status: DC
  Administered 2022-01-27 – 2022-01-28 (×2): 20 mg via ORAL
  Filled 2022-01-26 (×2): qty 1

## 2022-01-26 MED ORDER — POTASSIUM CHLORIDE CRYS ER 20 MEQ PO TBCR
40.0000 meq | EXTENDED_RELEASE_TABLET | Freq: Two times a day (BID) | ORAL | Status: AC
  Administered 2022-01-26: 40 meq via ORAL
  Filled 2022-01-26 (×2): qty 2

## 2022-01-26 MED ORDER — GUAIFENESIN 100 MG/5ML PO LIQD
5.0000 mL | ORAL | Status: DC | PRN
  Administered 2022-01-26 – 2022-01-28 (×4): 5 mL via ORAL
  Filled 2022-01-26 (×5): qty 5

## 2022-01-26 NOTE — Progress Notes (Signed)
Mobility Specialist - Progress Note   01/26/22 1100  Mobility  Activity Ambulated with assistance in hallway  Level of Assistance Contact guard assist, steadying assist  Assistive Device Front wheel walker  Distance Ambulated (ft) 80 ft  Activity Response Tolerated well  Mobility Referral Yes  $Mobility charge 1 Mobility    Pt received in room agreeable to mobility. Distance limited by c/o nausea. Left sitting EOB w/ call bell in reach.   [place] Specialist Please contact via SecureChat or Rehab office at [phone]

## 2022-01-26 NOTE — Progress Notes (Signed)
   Providing Compassionate, Quality Care - Together   Subjective: Patient reports episode of vomiting this afternoon. She feels her headaches are contributing to her nausea.  Objective: Vital signs in last 24 hours: Temp:  [98.2 F (36.8 C)-98.9 F (37.2 C)] 98.9 F (37.2 C) (01/17 0818) Pulse Rate:  [57-67] 66 (01/17 0820) Resp:  [16-18] 18 (01/17 0818) BP: (149-199)/(90-114) 174/97 (01/17 0832) SpO2:  [96 %-100 %] 100 % (01/17 0820)  Intake/Output from previous day: 01/16 0701 - 01/17 0700 In: 100 [P.O.:100] Out: -  Intake/Output this shift: Total I/O In: 200 [P.O.:200] Out: 100 [Emesis/NG output:100]  Alert and oriented x 4 PERRLA Speech clear and fluent CN II-XII grossly intact MAE, Strength and sensation intact  Lab Results: Recent Labs    01/26/22 1010  WBC 2.3*  HGB 10.7*  HCT 29.1*  PLT 143*   BMET Recent Labs    01/26/22 1010  NA 129*  K 3.2*  CL 89*  CO2 28  GLUCOSE 88  BUN 10  CREATININE 1.01*  CALCIUM 9.0    Studies/Results: DG ESOPHAGUS W SINGLE CM (SOL OR THIN BA)  Result Date: 01/25/2022 CLINICAL DATA:  Vomiting after eating, dysphagia EXAM: ESOPHAGUS/BARIUM SWALLOW/TABLET STUDY TECHNIQUE: Liminted, single contrast examination was performed using thin liquid barium and 1/2 inch barium tablet. This exam was performed by Pasty Spillers, PA-C, and was supervised and interpreted by Maurine Simmering, MD. FLUOROSCOPY: Radiation Exposure Index (as provided by the fluoroscopic device): 24.2 mGy Kerma COMPARISON:  No direct comparison available FINDINGS: Swallowing: No vestibular penetration or aspiration seen. Pharynx: Unremarkable. Esophagus: No obvious mucosal abnormality.  No visible ulceration. Esophageal motility: Mild distal esophageal dysmotility. Hiatal Hernia: Probable small sliding-type hiatal hernia. Gastroesophageal reflux: None visualized. Ingested 5mm barium tablet: Transit delayed passage in the distal esophagus but passed within 5 minutes.  Other: None. IMPRESSION: Mild esophageal dysmotility.  No evidence of stricture. Probable small sliding-type hiatal hernia. Electronically Signed   By: Maurine Simmering M.D.   On: 01/25/2022 15:57    Assessment/Plan: Patient with traumatic SAH. N/V has been present for several days and it's not clear if it is related to the [facility]. Continue to encourage patient to sit upright in bed or chair. Encourage mobilization. Dr. Reesa Chew ordered an esophogram to further investigate for a source of Ms. Todorov's persistent nausea. It was performed 01/25/2022. No strictures were identified. Mild esophageal dysmotility.    LOS: 8 days    -Fioricet ordered for headaches. -If Fioricet is helpful, can consider d/c home tomorrow. -Continue to mobilize.   Viona Gilmore, DNP, AGNP-C Nurse Practitioner  [facility] [place] [address], [address], [place], [place] [address] P: [phone]    F: [phone]  01/26/2022, 3:34 PM

## 2022-01-26 NOTE — Progress Notes (Signed)
[facility] PROGRESS NOTE   Karen Saunders RWE:[id number] DOB: 08-Feb-1955 DOA: 01/18/2022  PCP: Joaquim Nam, MD  Brief History/Interval Summary: 67 year old with history of CAD, HTN, HLD who had COVID about a month ago admitted to the hospital for traumatic Sub arachnoid hemorrhage after multiple falls.  She has been managed by neurosurgery but medical team was consulted for nausea and vomiting.  Off-and-on having nausea but was able to tolerate full liquid diet.  KUB shows nonobstructive pattern.    Subjective/Interval History: Patient had a lot of nausea yesterday and vomited yesterday afternoon.  She is scared of trying to have something by mouth.  Continues to have headache.  She mentions that her nausea gets worse when she has severe headache.  Denies any abdominal pain.  No dysuria.    Assessment/Plan:  Nausea and vomiting No obvious GI cause identified.  Patient does mention a history of acid reflux.  She was placed on PPI and Carafate.  Abdomen is benign on examination.  KUB did not show any acute findings.  Esophageal exam also does not show any strictures.  Recently done CT scan did not show any acute findings. Symptoms appear to be most likely due to a neurological process. Patient was started on scheduled metoclopramide yesterday intravenously.  Has not had any vomiting through the night.  Encouraged her to try to have something by mouth today.  Mobilize.  Out of bed to chair.  Traumatic subarachnoid hemorrhage Management per neurosurgery.  Accelerated hypertension Noted to be on amlodipine HCTZ and metoprolol.  Also on spironolactone.  Dose of amlodipine was increased yesterday. May need additional agents.  Heart rate in the 50s so we cannot go up on the dose of the metoprolol.  May need to use ACE inhibitor. Will order labs today.  Constipation Continue bowel regimen.  History of coronary artery disease status post CABG Stable.  Hyperlipidemia Continue  statin.   DVT Prophylaxis: SCDs Code Status: Full code     Medications: Scheduled:  amLODipine  10 mg Oral Daily   carbamazepine  400 mg Oral BID   Chlorhexidine Gluconate Cloth  6 each Topical Daily   docusate sodium  100 mg Oral BID   ezetimibe  10 mg Oral Daily   famotidine  20 mg Oral BID   hydrochlorothiazide  25 mg Oral Daily   loratadine  10 mg Oral Daily   metoCLOPramide (REGLAN) injection  5 mg Intravenous TID AC   metoprolol tartrate  25 mg Oral BID   pantoprazole  40 mg Oral BID   polyethylene glycol  17 g Oral Daily   rosuvastatin  40 mg Oral Daily   spironolactone  25 mg Oral Daily   sucralfate  1 g Oral TID WC & HS   traZODone  50 mg Oral QHS   Continuous: [id number] **OR** acetaminophen, gabapentin, hydrALAZINE, HYDROcodone-acetaminophen, HYDROmorphone (DILAUDID) injection, metoprolol tartrate, ondansetron **OR** ondansetron (ZOFRAN) IV, mouth rinse, prochlorperazine, promethazine, senna-docusate  Antibiotics: Anti-infectives (From admission, onward)    None       Objective:  Vital Signs  Vitals:   01/26/22 0508 01/26/22 0818 01/26/22 0820 01/26/22 0832  BP: (!) 149/92 (!) 199/114 (!) 197/110 (!) 174/97  Pulse: (!) 57 67 66   Resp: 16 18    Temp: 98.6 F (37 C) 98.9 F (37.2 C)    TempSrc: Oral Oral    SpO2: 98% 99% 100%   Weight:      Height:  Intake/Output Summary (Last 24 hours) at 01/26/2022 0926 Last data filed at 01/25/2022 1608 Gross per 24 hour  Intake 100 ml  Output --  Net 100 ml   Filed Weights   01/19/22 2131 01/21/22 0108  Weight: 49 kg 49.7 kg    General appearance: Awake alert.  In no distress Resp: Clear to auscultation bilaterally.  Normal effort Cardio: S1-S2 is normal regular.  No S3-S4.  No rubs murmurs or bruit GI: Abdomen is soft.  Nontender nondistended.  Bowel sounds are present normal.  No masses organomegaly Extremities: No edema.  Full range of motion of lower extremities. Neurologic: Alert  and oriented x3.  No focal neurological deficits.    Lab Results:  Data Reviewed: I have personally reviewed following labs and reports of the imaging studies  CBC: Recent Labs  Lab 01/20/22 0557 01/20/22 1313 01/23/22 1123  WBC 1.8* 1.5* 2.7*  NEUTROABS  --   --  2.1  HGB 9.5* 8.9* 10.5*  HCT 27.7* 26.1* 29.6*  MCV 90.8 92.6 88.4  PLT 103* 96* 111*    Basic Metabolic Panel: Recent Labs  Lab 01/20/22 0557 01/23/22 1123  NA 139 135  K 3.8 3.3*  CL 106 99  CO2 26 23  GLUCOSE 107* 83  BUN 12 13  CREATININE 0.91 1.01*  CALCIUM 8.7* 9.0    GFR: Estimated Creatinine Clearance: 41.8 mL/min (A) (by C-G formula based on SCr of 1.01 mg/dL (H)).  Liver Function Tests: Recent Labs  Lab 01/23/22 1123  AST 60*  ALT 29  ALKPHOS 72  BILITOT 0.6  PROT 6.4*  ALBUMIN 3.6     CBG: Recent Labs  Lab 01/25/22 0025 01/25/22 0740 01/25/22 1552 01/26/22 0522 01/26/22 0814  GLUCAP 120* 106* 108* 83 104*     Recent Results (from the past 240 hour(s))  Resp panel by RT-PCR (RSV, Flu A&B, Covid) Anterior Nasal Swab     Status: None   Collection Time: 01/18/22  7:12 PM   Specimen: Anterior Nasal Swab  Result Value Ref Range Status   SARS Coronavirus 2 by RT PCR NEGATIVE NEGATIVE Final    Comment: (NOTE) SARS-CoV-2 target nucleic acids are NOT DETECTED.  The SARS-CoV-2 RNA is generally detectable in upper respiratory specimens during the acute phase of infection. The lowest concentration of SARS-CoV-2 viral copies this assay can detect is 138 copies/mL. A negative result does not preclude SARS-Cov-2 infection and should not be used as the sole basis for treatment or other patient management decisions. A negative result may occur with  improper specimen collection/handling, submission of specimen other than nasopharyngeal swab, presence of viral mutation(s) within the areas targeted by this assay, and inadequate number of viral copies(<138 copies/mL). A negative  result must be combined with clinical observations, patient history, and epidemiological information. The expected result is Negative.  Fact Sheet for Patients:  BloggerCourse.com  Fact Sheet for Healthcare Providers:  SeriousBroker.it  This test is no t yet approved or cleared by the [place] FDA and  has been authorized for detection and/or diagnosis of SARS-CoV-2 by FDA under an Emergency Use Authorization (EUA). This EUA will remain  in effect (meaning this test can be used) for the duration of the COVID-19 declaration under Section 564(b)(1) of the Act, 21 U.S.C.section 360bbb-3(b)(1), unless the authorization is terminated  or revoked sooner.       Influenza A by PCR NEGATIVE NEGATIVE Final   Influenza B by PCR NEGATIVE NEGATIVE Final    Comment: (NOTE) The  Xpert Xpress SARS-CoV-2/FLU/RSV plus assay is intended as an aid in the diagnosis of influenza from Nasopharyngeal swab specimens and should not be used as a sole basis for treatment. Nasal washings and aspirates are unacceptable for Xpert Xpress SARS-CoV-2/FLU/RSV testing.  Fact Sheet for Patients: EntrepreneurPulse.com.au  Fact Sheet for Healthcare Providers: IncredibleEmployment.be  This test is not yet approved or cleared by the [place] FDA and has been authorized for detection and/or diagnosis of SARS-CoV-2 by FDA under an Emergency Use Authorization (EUA). This EUA will remain in effect (meaning this test can be used) for the duration of the COVID-19 declaration under Section 564(b)(1) of the Act, 21 U.S.C. section 360bbb-3(b)(1), unless the authorization is terminated or revoked.     Resp Syncytial Virus by PCR NEGATIVE NEGATIVE Final    Comment: (NOTE) Fact Sheet for Patients: EntrepreneurPulse.com.au  Fact Sheet for Healthcare Providers: IncredibleEmployment.be  This  test is not yet approved or cleared by the [place] FDA and has been authorized for detection and/or diagnosis of SARS-CoV-2 by FDA under an Emergency Use Authorization (EUA). This EUA will remain in effect (meaning this test can be used) for the duration of the COVID-19 declaration under Section 564(b)(1) of the Act, 21 U.S.C. section 360bbb-3(b)(1), unless the authorization is terminated or revoked.  Performed at [facility], [place] [address], [place], [place] [address]   MRSA Next Gen by PCR, Nasal     Status: None   Collection Time: 01/21/22  1:11 AM   Specimen: Nasal Mucosa; Nasal Swab  Result Value Ref Range Status   MRSA by PCR Next Gen NOT DETECTED NOT DETECTED Final    Comment: (NOTE) The GeneXpert MRSA Assay (FDA approved for NASAL specimens only), is one component of a comprehensive MRSA colonization surveillance program. It is not intended to diagnose MRSA infection nor to guide or monitor treatment for MRSA infections. Test performance is not FDA approved in patients less than 37 years old. Performed at [facility], [place] [address], [place], [place] [address]       Radiology Studies: DG ESOPHAGUS W SINGLE CM (SOL OR THIN BA)  Result Date: 01/25/2022 CLINICAL DATA:  Vomiting after eating, dysphagia EXAM: ESOPHAGUS/BARIUM SWALLOW/TABLET STUDY TECHNIQUE: Liminted, single contrast examination was performed using thin liquid barium and 1/2 inch barium tablet. This exam was performed by Pasty Spillers, PA-C, and was supervised and interpreted by Maurine Simmering, MD. FLUOROSCOPY: Radiation Exposure Index (as provided by the fluoroscopic device): 24.2 mGy Kerma COMPARISON:  No direct comparison available FINDINGS: Swallowing: No vestibular penetration or aspiration seen. Pharynx: Unremarkable. Esophagus: No obvious mucosal abnormality.  No visible ulceration. Esophageal motility: Mild distal esophageal dysmotility. Hiatal Hernia: Probable small  sliding-type hiatal hernia. Gastroesophageal reflux: None visualized. Ingested 43mm barium tablet: Transit delayed passage in the distal esophagus but passed within 5 minutes. Other: None. IMPRESSION: Mild esophageal dysmotility.  No evidence of stricture. Probable small sliding-type hiatal hernia. Electronically Signed   By: Maurine Simmering M.D.   On: 01/25/2022 15:57   DG Abd 1 View  Result Date: 01/24/2022 CLINICAL DATA:  67 year old female with nausea and vomiting. EXAM: ABDOMEN - 1 VIEW COMPARISON:  CT Abdomen and Pelvis 01/18/2022. FINDINGS: Supine views at 1130 hours. Bowel gas pattern nonobstructed, not significantly changed from the recent CT. Skin fold artifact suspected over the right mid abdomen. Stable abdominal and pelvic visceral contours. Levoconvex lumbar scoliosis with disc and endplate degeneration. Pelvic vascular calcifications. No acute osseous abnormality identified. IMPRESSION: Nonobstructed bowel-gas pattern.  No acute radiographic finding. Electronically Signed   By: Genevie Ann M.D.   On: 01/24/2022 11:43       LOS: 8 days   Aniela Caniglia [employer] Pager on [email or web address]  01/26/2022, 9:26 AM

## 2022-01-26 NOTE — Progress Notes (Signed)
Occupational Therapy Treatment Patient Details Name: Karen Saunders MRN: [id number] DOB: October 19, 1955 Today's Date: 01/26/2022   History of present illness Pt is a 67 y/o female admitted with traumatic SAH s/p falls at home 2 diarrhea and dehydration. PMH significant for anemia, CAD, gastic ulcer, HTN, IBS, leukopenia, migraines, Osteoporosis, trigeminal neuralgia   OT comments  Pt participated in OT treatment session while completing Pill Box Test to assess executive cognitive skills pertaining to anticipatory skills, attention, problem solving, sequencing in relation to managing medication. Pt failed test with greater than 3 or more errors indicating executive functioning skills deficits. Discussed errors with pt who verbalized understanding.      Recommendations for follow up therapy are one component of a multi-disciplinary discharge planning process, led by the attending physician.  Recommendations may be updated based on patient status, additional functional criteria and insurance authorization.    Follow Up Recommendations  Home health OT     Assistance Recommended at Discharge Intermittent Supervision/Assistance  Patient can return home with the following  A little help with walking and/or transfers;A little help with bathing/dressing/bathroom;Direct supervision/assist for medications management   Equipment Recommendations  Other (comment) (RW)       Precautions / Restrictions Precautions Precautions: Fall Precaution Comments: Frequently vomiting Restrictions Weight Bearing Restrictions: No       Mobility Bed Mobility Overal bed mobility: Needs Assistance Bed Mobility: Supine to Sit     Supine to sit: Supervision, HOB elevated               Balance Overall balance assessment: No apparent balance deficits (not formally assessed) Sitting-balance support: Feet unsupported, No upper extremity supported Sitting balance-Leahy Scale: Fair Sitting balance - Comments:  stiting EOB               ADL either performed or assessed with clinical judgement      Cognition Arousal/Alertness: Awake/alert Behavior During Therapy: WFL for tasks assessed/performed Overall Cognitive Status: Impaired/Different from baseline Area of Impairment: Safety/judgement       Safety/Judgement: Decreased awareness of safety, Decreased awareness of deficits     General Comments: Pt participated in Pill Box test with 3 or more errors demonstrated which is a fail. Errors included missreading label on one medication and placing two pills in AM every day versus placing 1 pill in AM every day and 1 pill at dinner every day.                   Pertinent Vitals/ Pain       Pain Assessment Pain Assessment: Faces Faces Pain Scale: No hurt         Frequency  Min 2X/week        Progress Toward Goals  OT Goals(current goals can now be found in the care plan section)  Progress towards OT goals: Progressing toward goals     Plan Frequency remains appropriate;Discharge plan needs to be updated       AM-PAC OT "6 Clicks" Daily Activity     Outcome Measure   Help from another person eating meals?: None Help from another person taking care of personal grooming?: None Help from another person toileting, which includes using toliet, bedpan, or urinal?: A Little Help from another person bathing (including washing, rinsing, drying)?: A Little Help from another person to put on and taking off regular upper body clothing?: A Little Help from another person to put on and taking off regular lower body clothing?: A Little 6 Click Score: 20  End of Session    OT Visit Diagnosis: Unsteadiness on feet (R26.81);Muscle weakness (generalized) (M62.81)   Activity Tolerance Patient tolerated treatment well   Patient Left in bed;with call bell/phone within reach;with family/visitor present           Time: 3016-0109 OT Time Calculation (min): 16 min  Charges:  OT General Charges $OT Visit: 1 Visit OT Treatments $Therapeutic Activity: 8-22 mins  Ailene Ravel, OTR/L,CBIS  Supplemental OT - [facility] and [facility] Secure Chat Preferred    Trevyn Lumpkin, Clarene Duke 01/26/2022, 4:32 PM

## 2022-01-26 NOTE — Progress Notes (Signed)
Physical Therapy Treatment Patient Details Name: Karen Saunders MRN: [id number] DOB: 06/29/1955 Today's Date: 01/26/2022   History of Present Illness Pt is a 67 y/o female admitted with traumatic SAH s/p falls at home 2 diarrhea and dehydration. PMH significant for anemia, CAD, gastic ulcer, HTN, IBS, leukopenia, migraines, Osteoporosis, trigeminal neuralgia    PT Comments    Pt progressing towards physical therapy goals. At start of session, pt was received sitting on toilet in bathroom vomiting. Min assist required to steady pt's trunk and prevent fall off commode during vomiting episodes. RN arrived to provide nausea meds and pt was agreeable to ambulation after. Pt was able to improved ambulation distance without any seated rest breaks this session. Will continue efforts to progress functional mobility, improve strength and tolerance for OOB activity. Will continue to follow.     Recommendations for follow up therapy are one component of a multi-disciplinary discharge planning process, led by the attending physician.  Recommendations may be updated based on patient status, additional functional criteria and insurance authorization.  Follow Up Recommendations  Home health PT (with sister support)     Assistance Recommended at Discharge Intermittent Supervision/Assistance  Patient can return home with the following A little help with walking and/or transfers;A little help with bathing/dressing/bathroom;Assistance with cooking/housework;Assist for transportation;Help with stairs or ramp for entrance   Equipment Recommendations  Rolling walker (2 wheels)    Recommendations for Other Services Rehab consult     Precautions / Restrictions Precautions Precautions: Fall Precaution Comments: Frequently vomiting Restrictions Weight Bearing Restrictions: No     Mobility  Bed Mobility Overal bed mobility: Needs Assistance Bed Mobility: Sit to Supine       Sit to supine: Supervision,  HOB elevated   General bed mobility comments: Pt was able to transition to supine without difficulty. Increased time taken.    Transfers Overall transfer level: Needs assistance Equipment used: Rolling walker (2 wheels) Transfers: Sit to/from Stand Sit to Stand: Supervision           General transfer comment: Supervision for safety. Pt with multiple sit<>stand throughout session from EOB and toilet.    Ambulation/Gait Ambulation/Gait assistance: Min guard Gait Distance (Feet): 250 Feet Assistive device: Rolling walker (2 wheels) Gait Pattern/deviations: Step-through pattern, Decreased stride length, Trunk flexed Gait velocity: Decreased Gait velocity interpretation: <1.31 ft/sec, indicative of household ambulator   General Gait Details: Pt ambulating slow but generally steady without overt LOB. Able to tolerate increased ambulation distance this session.   Stairs             Wheelchair Mobility    Modified Rankin (Stroke Patients Only)       Balance Overall balance assessment: Needs assistance Sitting-balance support: Bilateral upper extremity supported, Feet supported Sitting balance-Leahy Scale: Fair     Standing balance support: Bilateral upper extremity supported, During functional activity, Reliant on assistive device for balance Standing balance-Leahy Scale: Poor                              Cognition Arousal/Alertness: Awake/alert Behavior During Therapy: WFL for tasks assessed/performed Overall Cognitive Status: Impaired/Different from baseline Area of Impairment: Safety/judgement                         Safety/Judgement: Decreased awareness of safety, Decreased awareness of deficits Awareness: Anticipatory            Exercises  General Comments        Pertinent Vitals/Pain Pain Assessment Pain Assessment: Faces Faces Pain Scale: Hurts a little bit Pain Location: R UE IV site Pain Descriptors /  Indicators: Burning Pain Intervention(s): Monitored during session, Other (comment) (RN aware)    Home Living                          Prior Function            PT Goals (current goals can now be found in the care plan section) Acute Rehab PT Goals Patient Stated Goal: Feel better PT Goal Formulation: With patient Time For Goal Achievement: 02/04/22 Potential to Achieve Goals: Good Progress towards PT goals: Progressing toward goals    Frequency    Min 4X/week      PT Plan Current plan remains appropriate    Co-evaluation              AM-PAC PT "6 Clicks" Mobility   Outcome Measure  Help needed turning from your back to your side while in a flat bed without using bedrails?: A Little Help needed moving from lying on your back to sitting on the side of a flat bed without using bedrails?: A Little Help needed moving to and from a bed to a chair (including a wheelchair)?: A Little Help needed standing up from a chair using your arms (e.g., wheelchair or bedside chair)?: A Little Help needed to walk in hospital room?: A Little Help needed climbing 3-5 steps with a railing? : A Little 6 Click Score: 18    End of Session Equipment Utilized During Treatment: Gait belt Activity Tolerance: Patient tolerated treatment well;Other (comment) (limited by nausea) Patient left: in chair;with call bell/phone within reach;with family/visitor present Nurse Communication: Mobility status PT Visit Diagnosis: Unsteadiness on feet (R26.81);Other symptoms and signs involving the nervous system (R29.898)     Time: 1610-9604 PT Time Calculation (min) (ACUTE ONLY): 25 min  Charges:  $Gait Training: 23-37 mins                     Karen Saunders, PT, DPT Acute Rehabilitation Services Secure Chat Preferred Office: [phone]    Karen Saunders 01/26/2022, 3:23 PM

## 2022-01-27 ENCOUNTER — Other Ambulatory Visit: Payer: Self-pay | Admitting: Family Medicine

## 2022-01-27 DIAGNOSIS — E871 Hypo-osmolality and hyponatremia: Secondary | ICD-10-CM | POA: Diagnosis not present

## 2022-01-27 DIAGNOSIS — R112 Nausea with vomiting, unspecified: Secondary | ICD-10-CM | POA: Diagnosis not present

## 2022-01-27 DIAGNOSIS — I10 Essential (primary) hypertension: Secondary | ICD-10-CM | POA: Diagnosis not present

## 2022-01-27 LAB — BASIC METABOLIC PANEL
Anion gap: 11 (ref 5–15)
BUN: 12 mg/dL (ref 8–23)
CO2: 24 mmol/L (ref 22–32)
Calcium: 9.1 mg/dL (ref 8.9–10.3)
Chloride: 95 mmol/L — ABNORMAL LOW (ref 98–111)
Creatinine, Ser: 1.19 mg/dL — ABNORMAL HIGH (ref 0.44–1.00)
GFR, Estimated: 50 mL/min — ABNORMAL LOW (ref >60)
Glucose, Bld: 83 mg/dL (ref 70–99)
Potassium: 3.8 mmol/L (ref 3.5–5.1)
Sodium: 130 mmol/L — ABNORMAL LOW (ref 135–145)

## 2022-01-27 LAB — MAGNESIUM: Magnesium: 2.3 mg/dL (ref 1.7–2.4)

## 2022-01-27 LAB — GLUCOSE, CAPILLARY
Glucose-Capillary: 121 mg/dL — ABNORMAL HIGH (ref 70–99)
Glucose-Capillary: 122 mg/dL — ABNORMAL HIGH (ref 70–99)

## 2022-01-27 MED ORDER — POTASSIUM CHLORIDE CRYS ER 20 MEQ PO TBCR
40.0000 meq | EXTENDED_RELEASE_TABLET | Freq: Once | ORAL | Status: AC
  Administered 2022-01-27: 40 meq via ORAL
  Filled 2022-01-27: qty 2

## 2022-01-27 MED ORDER — SODIUM CHLORIDE 0.9 % IV SOLN: INTRAVENOUS | Status: AC

## 2022-01-27 NOTE — TOC Progression Note (Addendum)
Transition of Care [facility]) - Progression Note    Patient Details  Name: Karen Saunders MRN: [id number] Date of Birth: 1955/04/17  Transition of [facility]) CM/SW Contact  Andraya Frigon, Edson Snowball, RN Phone Number: 01/27/2022, 10:02 AM  Clinical Narrative:     Hoyle Sauer with [place] asking for home health orders. Secure messaged Viona Gilmore NP   [phone] Received home health orders. Anticipated DC date 01/28/22. Hoyle Sauer with [place] aware   Expected Discharge Plan: [place] Barriers to Discharge: Continued Medical Work up  Expected Discharge Plan and Services   Discharge Planning Services: CM Consult Post Acute Care Choice: [place] arrangements for the past 2 months: Single Family Home                 DME Arranged: N/A DME Agency: NA       HH Arranged: PT, OT [place] Agency: [place] (now known as [place]) Date [place]: 01/24/22 Time [place]: 1220 Representative spoke with at [place]: [place] ([place]) Interventions [place]: No Food Insecurity (12/08/2021)  Housing: Low Risk  (12/08/2021)  Transportation Needs: No Transportation Needs (12/08/2021)  Utilities: Not At Risk (12/08/2021)  Depression (PHQ2-9): Low Risk  (12/08/2021)  Financial Resource Strain: Low Risk  (12/08/2021)  Social Connections: Unknown (12/08/2021)  Stress: No Stress Concern Present (12/08/2021)  Tobacco Use: Low Risk  (01/18/2022)    Readmission Risk Interventions     No data to display

## 2022-01-27 NOTE — Progress Notes (Signed)
Physical Therapy Treatment Patient Details Name: Karen Saunders MRN: [id number] DOB: [birth year] Today's Date: 01/27/2022   History of Present Illness Pt is a 67 y/o female admitted with traumatic SAH s/p falls at home 2 diarrhea and dehydration. PMH significant for anemia, CAD, gastic ulcer, HTN, IBS, leukopenia, migraines, Osteoporosis, trigeminal neuralgia    PT Comments    Pt progressing towards physical therapy goals. Pt reports improvement in symptoms this session and visually appears more alert and engaged with therapist. Pt reports feeling unsteady at times but no overt LOB noted throughout OOB mobility. She was educated on recommended activity progression, car transfer, and use of DME at home (pt has BSC, needs RW). Pt anticipates d/c home tomorrow evening with family support. Will continue to follow and progress as able per POC.     Recommendations for follow up therapy are one component of a multi-disciplinary discharge planning process, led by the attending physician.  Recommendations may be updated based on patient status, additional functional criteria and insurance authorization.  Follow Up Recommendations  Home health PT (with sister support)     Assistance Recommended at Discharge Intermittent Supervision/Assistance  Patient can return home with the following A little help with walking and/or transfers;A little help with bathing/dressing/bathroom;Assistance with cooking/housework;Assist for transportation;Help with stairs or ramp for entrance   Equipment Recommendations  Rolling walker (2 wheels)    Recommendations for Other Services Rehab consult     Precautions / Restrictions Precautions Precautions: Fall Precaution Comments: Frequent vomiting Restrictions Weight Bearing Restrictions: No     Mobility  Bed Mobility Overal bed mobility: Modified Independent Bed Mobility: Supine to Sit, Sit to Supine           General bed mobility comments: Increased time  but pt able to transition to/from EOB without assistance.    Transfers Overall transfer level: Needs assistance Equipment used: Rolling walker (2 wheels) Transfers: Sit to/from Stand Sit to Stand: Supervision           General transfer comment: Supervision for safety. Pt with multiple sit<>stand throughout session from EOB and toilet without LOB    Ambulation/Gait Ambulation/Gait assistance: Supervision Gait Distance (Feet): 300 Feet Assistive device: Rolling walker (2 wheels) Gait Pattern/deviations: Step-through pattern, Decreased stride length, Trunk flexed Gait velocity: Decreased Gait velocity interpretation: 1.31 - 2.62 ft/sec, indicative of limited community ambulator   General Gait Details: Slow but generally steady without overt LOB. Pt able to make corrective changes to improve posture and hold walker closer.   Stairs             Wheelchair Mobility    Modified Rankin (Stroke Patients Only)       Balance Overall balance assessment: No apparent balance deficits (not formally assessed) Sitting-balance support: Feet unsupported, No upper extremity supported Sitting balance-Leahy Scale: Fair Sitting balance - Comments: stiting EOB   Standing balance support: Bilateral upper extremity supported, During functional activity, Reliant on assistive device for balance Standing balance-Leahy Scale: Poor Standing balance comment: tendency for posterior bias, pt-corrected this session with use of AD                            Cognition Arousal/Alertness: Awake/alert Behavior During Therapy: WFL for tasks assessed/performed Overall Cognitive Status: Impaired/Different from baseline Area of Impairment: Safety/judgement                         Safety/Judgement: Decreased awareness of safety, Decreased  awareness of deficits              Exercises      General Comments        Pertinent Vitals/Pain Pain Assessment Pain Assessment:  Faces Faces Pain Scale: No hurt    Home Living                          Prior Function            PT Goals (current goals can now be found in the care plan section) Acute Rehab PT Goals Patient Stated Goal: Return home tomorrow PT Goal Formulation: With patient Time For Goal Achievement: 02/04/22 Potential to Achieve Goals: Good Progress towards PT goals: Progressing toward goals    Frequency    Min 4X/week      PT Plan Current plan remains appropriate    Co-evaluation              AM-PAC PT "6 Clicks" Mobility   Outcome Measure  Help needed turning from your back to your side while in a flat bed without using bedrails?: A Little Help needed moving from lying on your back to sitting on the side of a flat bed without using bedrails?: A Little Help needed moving to and from a bed to a chair (including a wheelchair)?: A Little Help needed standing up from a chair using your arms (e.g., wheelchair or bedside chair)?: A Little Help needed to walk in hospital room?: A Little Help needed climbing 3-5 steps with a railing? : A Little 6 Click Score: 18    End of Session Equipment Utilized During Treatment: Gait belt Activity Tolerance: Patient tolerated treatment well Patient left: in chair;with call bell/phone within reach;with family/visitor present Nurse Communication: Mobility status PT Visit Diagnosis: Unsteadiness on feet (R26.81);Other symptoms and signs involving the nervous system (R29.898)     Time: 0454-0981 PT Time Calculation (min) (ACUTE ONLY): 21 min  Charges:  $Gait Training: 23-37 mins                     Rolinda Roan, PT, DPT Acute Rehabilitation Services Secure Chat Preferred Office: [phone]    Thelma Comp 01/27/2022, 3:34 PM

## 2022-01-27 NOTE — Progress Notes (Signed)
   Providing Compassionate, Quality Care - Together   Subjective: Patient washing up in the restroom independently at the time of the assessment. Headache improved with dose of Fioricet this AM. Patient still feels a little "wobbly" on her feet and would like to work with PT one more day before discharging home tomorrow.  Objective: Vital signs in last 24 hours: Temp:  [98.1 F (36.7 C)-98.6 F (37 C)] 98.1 F (36.7 C) (01/18 0833) Pulse Rate:  [61-68] 63 (01/18 0833) Resp:  [17-18] 17 (01/18 0833) BP: (147-189)/(94-99) 171/99 (01/18 0833) SpO2:  [98 %-99 %] 99 % (01/18 0833)  Intake/Output from previous day: 01/17 0701 - 01/18 0700 In: 250 [P.O.:250] Out: 100 [Emesis/NG output:100] Intake/Output this shift: No intake/output data recorded.  Alert and oriented x 4 PERRLA Speech clear and fluent CN II-XII grossly intact MAE, Strength and sensation intact  Lab Results: Recent Labs    01/26/22 1010  WBC 2.3*  HGB 10.7*  HCT 29.1*  PLT 143*   BMET Recent Labs    01/26/22 1010 01/27/22 0512  NA 129* 130*  K 3.2* 3.8  CL 89* 95*  CO2 28 24  GLUCOSE 88 83  BUN 10 12  CREATININE 1.01* 1.19*  CALCIUM 9.0 9.1    Studies/Results: DG ESOPHAGUS W SINGLE CM (SOL OR THIN BA)  Result Date: 01/25/2022 CLINICAL DATA:  Vomiting after eating, dysphagia EXAM: ESOPHAGUS/BARIUM SWALLOW/TABLET STUDY TECHNIQUE: Liminted, single contrast examination was performed using thin liquid barium and 1/2 inch barium tablet. This exam was performed by Pasty Spillers, PA-C, and was supervised and interpreted by Maurine Simmering, MD. FLUOROSCOPY: Radiation Exposure Index (as provided by the fluoroscopic device): 24.2 mGy Kerma COMPARISON:  No direct comparison available FINDINGS: Swallowing: No vestibular penetration or aspiration seen. Pharynx: Unremarkable. Esophagus: No obvious mucosal abnormality.  No visible ulceration. Esophageal motility: Mild distal esophageal dysmotility. Hiatal Hernia:  Probable small sliding-type hiatal hernia. Gastroesophageal reflux: None visualized. Ingested 18mm barium tablet: Transit delayed passage in the distal esophagus but passed within 5 minutes. Other: None. IMPRESSION: Mild esophageal dysmotility.  No evidence of stricture. Probable small sliding-type hiatal hernia. Electronically Signed   By: Maurine Simmering M.D.   On: 01/25/2022 15:57    Assessment/Plan: Patient with traumatic SAH. N/V has been present for several days and it's not clear if it is related to the [facility]. Continue to encourage patient to sit upright in bed or chair. Encourage mobilization. Dr. Reesa Chew ordered an esophogram to further investigate for a source of Ms. Hail's persistent nausea. It was performed 01/25/2022. No strictures were identified. Mild esophageal dysmotility. Will send Fioricet to patient's pharmacy at discharge.    LOS: 9 days    Viona Gilmore, DNP, AGNP-C Nurse Practitioner  [facility] [place] [address], [place], [place] [address] P: [phone]    F: [phone]  01/27/2022, 11:58 AM

## 2022-01-27 NOTE — Progress Notes (Signed)
[facility] PROGRESS NOTE   Karen Saunders NWG:[id number] DOB: July 05, 1955 DOA: 01/18/2022  PCP: Tonia Ghent, MD  Brief History/Interval Summary: 67 year old with history of CAD, HTN, HLD who had COVID about a month ago admitted to the hospital for traumatic Sub arachnoid hemorrhage after multiple falls.  She has been managed by neurosurgery but medical team was consulted for nausea and vomiting.  Off-and-on having nausea but was able to tolerate full liquid diet.  KUB shows nonobstructive pattern.    Subjective/Interval History: Patient had 2 episodes of emesis in the last 24 hours.  Headache seems to be improving.  Denies any abdominal pain.  No nausea this morning.  Headache is currently 5 out of 10 in intensity.    Assessment/Plan:  Nausea and vomiting No obvious GI cause identified.  Patient does mention a history of acid reflux.  She was placed on PPI and Carafate.  Abdomen is benign on examination.  KUB did not show any acute findings.  Esophagogram does not show any strictures.  Recently done CT scan did not show any acute findings. Symptoms appear to be most likely due to a neurological process and headache. Patient remains on scheduled metoclopramide. Started on Fioricet by neurosurgery.  The key will be to get her headaches under control.  Will defer this to neurosurgery. Continue to mobilize.  Out of bed to chair.  Traumatic subarachnoid hemorrhage/headaches Management per neurosurgery.  Hyponatremia/hypokalemia/hypomagnesemia/dehydration Sodium levels noted to be low yesterday.  Started on IV fluids so there was an element of dehydration from poor oral intake. Potassium level improved.  Magnesium also improved to 2.3 from 1.4. Continue with IV fluids for the rest of the day.  Accelerated hypertension Headache is contributing to elevated blood pressure readings.  HCTZ and spironolactone held due to hyponatremia.  Continue with amlodipine metoprolol.  Lisinopril  was started yesterday.  Slight rise in creatinine noted.  Okay to continue lisinopril for now.  Will recheck labs tomorrow.    Constipation Continue bowel regimen.  History of coronary artery disease status post CABG Stable.  Hyperlipidemia Continue statin.   DVT Prophylaxis: SCDs Code Status: Full code     Medications: Scheduled:  amLODipine  10 mg Oral Daily   carbamazepine  400 mg Oral BID   Chlorhexidine Gluconate Cloth  6 each Topical Daily   docusate sodium  100 mg Oral BID   ezetimibe  10 mg Oral Daily   famotidine  20 mg Oral BID   lisinopril  20 mg Oral Daily   loratadine  10 mg Oral Daily   metoCLOPramide (REGLAN) injection  5 mg Intravenous TID AC   metoprolol tartrate  25 mg Oral BID   pantoprazole  40 mg Oral BID   polyethylene glycol  17 g Oral Daily   potassium chloride  40 mEq Oral BID   rosuvastatin  40 mg Oral Daily   sucralfate  1 g Oral TID WC & HS   traZODone  50 mg Oral QHS   Continuous:  sodium chloride 75 mL/hr at 01/27/22 5784   ONG:[id number] **OR** acetaminophen, butalbital-acetaminophen-caffeine, gabapentin, guaiFENesin, hydrALAZINE, HYDROmorphone (DILAUDID) injection, metoprolol tartrate, ondansetron **OR** ondansetron (ZOFRAN) IV, mouth rinse, prochlorperazine, promethazine, senna-docusate  Antibiotics: Anti-infectives (From admission, onward)    None       Objective:  Vital Signs  Vitals:   01/26/22 2157 01/27/22 0437 01/27/22 0831 01/27/22 0833  BP: (!) 189/99 (!) 147/96 (!) 171/99 (!) 171/99  Pulse: 68 63 63 63  Resp: 17 17  17  Temp: 98.6 F (37 C) 98.2 F (36.8 C) 98.1 F (36.7 C) 98.1 F (36.7 C)  TempSrc: Oral Oral Oral Oral  SpO2: 98% 99% 99% 99%  Weight:      Height:        Intake/Output Summary (Last 24 hours) at 01/27/2022 0952 Last data filed at 01/26/2022 1717 Gross per 24 hour  Intake 50 ml  Output 100 ml  Net -50 ml    Filed Weights   01/19/22 2131 01/21/22 0108  Weight: 49 kg 49.7 kg     General appearance: Awake alert.  In no distress Resp: Clear to auscultation bilaterally.  Normal effort Cardio: S1-S2 is normal regular.  No S3-S4.  No rubs murmurs or bruit GI: Abdomen is soft.  Nontender nondistended.  Bowel sounds are present normal.  No masses organomegaly Extremities: No edema.  Full range of motion of lower extremities.    Lab Results:  Data Reviewed: I have personally reviewed following labs and reports of the imaging studies  CBC: Recent Labs  Lab 01/20/22 1313 01/23/22 1123 01/26/22 1010  WBC 1.5* 2.7* 2.3*  NEUTROABS  --  2.1  --   HGB 8.9* 10.5* 10.7*  HCT 26.1* 29.6* 29.1*  MCV 92.6 88.4 86.1  PLT 96* 111* 143*     Basic Metabolic Panel: Recent Labs  Lab 01/23/22 1123 01/26/22 1010 01/27/22 0512  NA 135 129* 130*  K 3.3* 3.2* 3.8  CL 99 89* 95*  CO2 23 28 24   GLUCOSE 83 88 83  BUN 13 10 12   CREATININE 1.01* 1.01* 1.19*  CALCIUM 9.0 9.0 9.1  MG  --  1.4* 2.3     GFR: Estimated Creatinine Clearance: 35.5 mL/min (A) (by C-G formula based on SCr of 1.19 mg/dL (H)).  Liver Function Tests: Recent Labs  Lab 01/23/22 1123 01/26/22 1010  AST 60* 36  ALT 29 21  ALKPHOS 72 75  BILITOT 0.6 0.3  PROT 6.4* 6.4*  ALBUMIN 3.6 3.7      CBG: Recent Labs  Lab 01/26/22 0522 01/26/22 0814 01/26/22 1710 01/26/22 2357 01/27/22 0832  GLUCAP 83 104* 90 94 121*      Recent Results (from the past 240 hour(s))  Resp panel by RT-PCR (RSV, Flu A&B, Covid) Anterior Nasal Swab     Status: None   Collection Time: 01/18/22  7:12 PM   Specimen: Anterior Nasal Swab  Result Value Ref Range Status   SARS Coronavirus 2 by RT PCR NEGATIVE NEGATIVE Final    Comment: (NOTE) SARS-CoV-2 target nucleic acids are NOT DETECTED.  The SARS-CoV-2 RNA is generally detectable in upper respiratory specimens during the acute phase of infection. The lowest concentration of SARS-CoV-2 viral copies this assay can detect is 138 copies/mL. A negative  result does not preclude SARS-Cov-2 infection and should not be used as the sole basis for treatment or other patient management decisions. A negative result may occur with  improper specimen collection/handling, submission of specimen other than nasopharyngeal swab, presence of viral mutation(s) within the areas targeted by this assay, and inadequate number of viral copies(<138 copies/mL). A negative result must be combined with clinical observations, patient history, and epidemiological information. The expected result is Negative.  Fact Sheet for Patients:  EntrepreneurPulse.com.au  Fact Sheet for Healthcare Providers:  IncredibleEmployment.be  This test is no t yet approved or cleared by the [place] FDA and  has been authorized for detection and/or diagnosis of SARS-CoV-2 by FDA under an Emergency Use Authorization (  EUA). This EUA will remain  in effect (meaning this test can be used) for the duration of the COVID-19 declaration under Section 564(b)(1) of the Act, 21 U.S.C.section 360bbb-3(b)(1), unless the authorization is terminated  or revoked sooner.       Influenza A by PCR NEGATIVE NEGATIVE Final   Influenza B by PCR NEGATIVE NEGATIVE Final    Comment: (NOTE) The Xpert Xpress SARS-CoV-2/FLU/RSV plus assay is intended as an aid in the diagnosis of influenza from Nasopharyngeal swab specimens and should not be used as a sole basis for treatment. Nasal washings and aspirates are unacceptable for Xpert Xpress SARS-CoV-2/FLU/RSV testing.  Fact Sheet for Patients: BloggerCourse.com  Fact Sheet for Healthcare Providers: SeriousBroker.it  This test is not yet approved or cleared by the [place] FDA and has been authorized for detection and/or diagnosis of SARS-CoV-2 by FDA under an Emergency Use Authorization (EUA). This EUA will remain in effect (meaning this test can be used)  for the duration of the COVID-19 declaration under Section 564(b)(1) of the Act, 21 U.S.C. section 360bbb-3(b)(1), unless the authorization is terminated or revoked.     Resp Syncytial Virus by PCR NEGATIVE NEGATIVE Final    Comment: (NOTE) Fact Sheet for Patients: BloggerCourse.com  Fact Sheet for Healthcare Providers: SeriousBroker.it  This test is not yet approved or cleared by the [place] FDA and has been authorized for detection and/or diagnosis of SARS-CoV-2 by FDA under an Emergency Use Authorization (EUA). This EUA will remain in effect (meaning this test can be used) for the duration of the COVID-19 declaration under Section 564(b)(1) of the Act, 21 U.S.C. section 360bbb-3(b)(1), unless the authorization is terminated or revoked.  Performed at [facility], [address], [place], [place] [address]   MRSA Next Gen by PCR, Nasal     Status: None   Collection Time: 01/21/22  1:11 AM   Specimen: Nasal Mucosa; Nasal Swab  Result Value Ref Range Status   MRSA by PCR Next Gen NOT DETECTED NOT DETECTED Final    Comment: (NOTE) The GeneXpert MRSA Assay (FDA approved for NASAL specimens only), is one component of a comprehensive MRSA colonization surveillance program. It is not intended to diagnose MRSA infection nor to guide or monitor treatment for MRSA infections. Test performance is not FDA approved in patients less than 79 years old. Performed at [facility], [address], [place], [place] [address]       Radiology Studies: DG ESOPHAGUS W SINGLE CM (SOL OR THIN BA)  Result Date: 01/25/2022 CLINICAL DATA:  Vomiting after eating, dysphagia EXAM: ESOPHAGUS/BARIUM SWALLOW/TABLET STUDY TECHNIQUE: Liminted, single contrast examination was performed using thin liquid barium and 1/2 inch barium tablet. This exam was performed by Sheliah Plane, PA-C, and was supervised and interpreted by  Caprice Renshaw, MD. FLUOROSCOPY: Radiation Exposure Index (as provided by the fluoroscopic device): 24.2 mGy Kerma COMPARISON:  No direct comparison available FINDINGS: Swallowing: No vestibular penetration or aspiration seen. Pharynx: Unremarkable. Esophagus: No obvious mucosal abnormality.  No visible ulceration. Esophageal motility: Mild distal esophageal dysmotility. Hiatal Hernia: Probable small sliding-type hiatal hernia. Gastroesophageal reflux: None visualized. Ingested 75mm barium tablet: Transit delayed passage in the distal esophagus but passed within 5 minutes. Other: None. IMPRESSION: Mild esophageal dysmotility.  No evidence of stricture. Probable small sliding-type hiatal hernia. Electronically Signed   By: Caprice Renshaw M.D.   On: 01/25/2022 15:57       LOS: 9 days   Brittan Mapel [facility]  [facility] Pager  on [email or web address]  01/27/2022, 9:52 AM

## 2022-01-28 DIAGNOSIS — I10 Essential (primary) hypertension: Secondary | ICD-10-CM | POA: Diagnosis not present

## 2022-01-28 DIAGNOSIS — E871 Hypo-osmolality and hyponatremia: Secondary | ICD-10-CM | POA: Diagnosis not present

## 2022-01-28 DIAGNOSIS — R112 Nausea with vomiting, unspecified: Secondary | ICD-10-CM | POA: Diagnosis not present

## 2022-01-28 LAB — CBC
HCT: 31.7 % — ABNORMAL LOW (ref 36.0–46.0)
Hemoglobin: 9.9 g/dL — ABNORMAL LOW (ref 12.0–15.0)
MCH: 27.1 pg (ref 26.0–34.0)
MCHC: 31.2 g/dL (ref 30.0–36.0)
MCV: 86.8 fL (ref 80.0–100.0)
Platelets: 173 10*3/uL (ref 150–400)
RBC: 3.65 MIL/uL — ABNORMAL LOW (ref 3.87–5.11)
RDW: 13.9 % (ref 11.5–15.5)
WBC: 3.6 10*3/uL — ABNORMAL LOW (ref 4.0–10.5)
nRBC: 0 % (ref 0.0–0.2)

## 2022-01-28 LAB — BASIC METABOLIC PANEL
Anion gap: 11 (ref 5–15)
BUN: 13 mg/dL (ref 8–23)
CO2: 23 mmol/L (ref 22–32)
Calcium: 9.1 mg/dL (ref 8.9–10.3)
Chloride: 99 mmol/L (ref 98–111)
Creatinine, Ser: 1.05 mg/dL — ABNORMAL HIGH (ref 0.44–1.00)
GFR, Estimated: 58 mL/min — ABNORMAL LOW (ref >60)
Glucose, Bld: 88 mg/dL (ref 70–99)
Potassium: 3.9 mmol/L (ref 3.5–5.1)
Sodium: 133 mmol/L — ABNORMAL LOW (ref 135–145)

## 2022-01-28 LAB — GLUCOSE, CAPILLARY
Glucose-Capillary: 100 mg/dL — ABNORMAL HIGH (ref 70–99)
Glucose-Capillary: 96 mg/dL (ref 70–99)

## 2022-01-28 MED ORDER — POLYETHYLENE GLYCOL 3350 17 G PO PACK: 17.0000 g | PACK | Freq: Every day | ORAL | 0 refills | Status: DC

## 2022-01-28 MED ORDER — AMLODIPINE BESYLATE 10 MG PO TABS: 5.0000 mg | ORAL_TABLET | Freq: Every day | ORAL | 0 refills | Status: DC

## 2022-01-28 MED ORDER — DOCUSATE SODIUM 100 MG PO CAPS: 100.0000 mg | ORAL_CAPSULE | Freq: Two times a day (BID) | ORAL | 0 refills | Status: DC

## 2022-01-28 MED ORDER — LISINOPRIL 20 MG PO TABS: 20.0000 mg | ORAL_TABLET | Freq: Every day | ORAL | 0 refills | Status: DC

## 2022-01-28 MED ORDER — BUTALBITAL-APAP-CAFFEINE 50-325-40 MG PO TABS: 1.0000 | ORAL_TABLET | ORAL | 0 refills | Status: DC | PRN

## 2022-01-28 MED ORDER — METOCLOPRAMIDE HCL 5 MG PO TABS: 5.0000 mg | ORAL_TABLET | Freq: Three times a day (TID) | ORAL | 0 refills | Status: DC

## 2022-01-28 MED ORDER — BENZONATATE 100 MG PO CAPS
100.0000 mg | ORAL_CAPSULE | Freq: Once | ORAL | Status: AC
  Administered 2022-01-28: 100 mg via ORAL
  Filled 2022-01-28: qty 1

## 2022-01-28 NOTE — Discharge Summary (Signed)
Physician Discharge Summary     Providing Compassionate, Quality Care - Together   Patient ID: Karen Saunders MRN: [id number] DOB/AGE: May 07, 1955 67 y.o.  Admit date: 01/18/2022 Discharge date: 01/28/2022  Admission Diagnoses: Traumatic SAH  Discharge Diagnoses:  Principal Problem:   Traumatic subarachnoid bleed with LOC of 30 minutes or less, initial encounter (HCC) Active Problems:   HLD (hyperlipidemia)   Hx of CABG   Resistant hypertension   Nausea & vomiting   Discharged Condition: good  Hospital Course: Patient was admitted with nausea, vomiting, and headache after a one week reported history of gastrointestinal viral symptoms. Imaging revealed scattered acute subarachnoid hemorrhage involving the bilateral cerebral hemispheres, most pronounced at the frontal lobes near the vertex. The patient was admitted for observation. Her hospital course was complicated by persistent nausea, vomiting, and hypertension. Her symptoms are now controlled by phenergan, Zofran, and Fioricet. She has been advised to follow up with her PCP one week post discharge.   Consults: Hospitalist service, TOC  Significant Diagnostic Studies: radiology: DG ESOPHAGUS W SINGLE CM (SOL OR THIN BA)  Result Date: 01/25/2022 CLINICAL DATA:  Vomiting after eating, dysphagia EXAM: ESOPHAGUS/BARIUM SWALLOW/TABLET STUDY TECHNIQUE: Liminted, single contrast examination was performed using thin liquid barium and 1/2 inch barium tablet. This exam was performed by Sheliah Plane, PA-C, and was supervised and interpreted by Caprice Renshaw, MD. FLUOROSCOPY: Radiation Exposure Index (as provided by the fluoroscopic device): 24.2 mGy Kerma COMPARISON:  No direct comparison available FINDINGS: Swallowing: No vestibular penetration or aspiration seen. Pharynx: Unremarkable. Esophagus: No obvious mucosal abnormality.  No visible ulceration. Esophageal motility: Mild distal esophageal dysmotility. Hiatal Hernia: Probable small  sliding-type hiatal hernia. Gastroesophageal reflux: None visualized. Ingested 53mm barium tablet: Transit delayed passage in the distal esophagus but passed within 5 minutes. Other: None. IMPRESSION: Mild esophageal dysmotility.  No evidence of stricture. Probable small sliding-type hiatal hernia. Electronically Signed   By: Caprice Renshaw M.D.   On: 01/25/2022 15:57   DG Abd 1 View  Result Date: 01/24/2022 CLINICAL DATA:  67 year old female with nausea and vomiting. EXAM: ABDOMEN - 1 VIEW COMPARISON:  CT Abdomen and Pelvis 01/18/2022. FINDINGS: Supine views at 1130 hours. Bowel gas pattern nonobstructed, not significantly changed from the recent CT. Skin fold artifact suspected over the right mid abdomen. Stable abdominal and pelvic visceral contours. Levoconvex lumbar scoliosis with disc and endplate degeneration. Pelvic vascular calcifications. No acute osseous abnormality identified. IMPRESSION: Nonobstructed bowel-gas pattern.  No acute radiographic finding. Electronically Signed   By: Odessa Fleming M.D.   On: 01/24/2022 11:43   CT HEAD WO CONTRAST ( )  Result Date: 01/18/2022 CLINICAL DATA:  Initial evaluation for acute headache, nausea, vomiting. EXAM: CT HEAD WITHOUT CONTRAST TECHNIQUE: Contiguous axial images were obtained from the base of the skull through the vertex without intravenous contrast. RADIATION DOSE REDUCTION: This exam was performed according to the departmental dose-optimization program which includes automated exposure control, adjustment of the mA and/or kV according to patient size and/or use of iterative reconstruction technique. COMPARISON:  None Available. FINDINGS: Brain: Cerebral volume within normal limits. Mild chronic microvascular ischemic disease. Scattered acute subarachnoid hemorrhage seen involving the bilateral cerebral hemispheres, most pronounced at the frontal lobes near the vertex. Subarachnoid blood seen within the left sylvian fissure. Possible small extra-axial  component overlies the left frontotemporal convexity, measuring up to 2 mm in thickness (series 4, image 30). Trace acute subdural hemorrhage seen along the right tentorium, measuring 3 mm in maximal thickness without mass effect. No intraparenchymal  or intraventricular hemorrhage. No acute large vessel territory infarct. No mass lesion or midline shift. No hydrocephalus. Vascular: No abnormal hyperdense vessel. Scattered vascular calcifications noted within the carotid siphons. Skull: Scalp soft tissues demonstrate no acute finding. Calvarium intact. Sinuses/Orbits: Globes orbital soft tissues within normal limits. Paranasal sinuses and mastoid air cells are clear. Other: None. IMPRESSION: 1. Scattered acute subarachnoid hemorrhage involving the bilateral cerebral hemispheres, most pronounced at the frontal lobes near the vertex. 2. Trace acute subdural hemorrhage along the right tentorium, measuring 3 mm in maximal thickness. Additional possible trace extra-axial component overlying the left cerebral convexity. No mass effect. 3. Underlying mild chronic microvascular ischemic disease. Critical Value/emergent results were called by telephone at the time of interpretation on 01/18/2022 at 10:30 pm to provider [facility] , who verbally acknowledged these results. Electronically Signed   By: Jeannine Boga M.D.   On: 01/18/2022 22:33   CT ABDOMEN PELVIS WO CONTRAST  Result Date: 01/18/2022 CLINICAL DATA:  Abdominal pain, acute, nonlocalized EXAM: CT ABDOMEN AND PELVIS WITHOUT CONTRAST TECHNIQUE: Multidetector CT imaging of the abdomen and pelvis was performed following the standard protocol without IV contrast. RADIATION DOSE REDUCTION: This exam was performed according to the departmental dose-optimization program which includes automated exposure control, adjustment of the mA and/or kV according to patient size and/or use of iterative reconstruction technique. COMPARISON:  05/07/2021 FINDINGS: Lower  chest: Median sternotomy has been performed. Coronary artery calcifications noted. Global cardiac size within normal limits. Visualized lung bases are clear. Hepatobiliary: No focal liver abnormality is seen. No gallstones, gallbladder wall thickening, or biliary dilatation. Pancreas: Unremarkable Spleen: Unremarkable Adrenals/Urinary Tract: The adrenal glands are unremarkable. The kidneys are normal in size and position. Vascular calcifications are noted within the renal hila bilaterally, right greater than left. No urinary renal or ureteral calculi. No hydronephrosis. No perinephric inflammatory stranding or fluid collections are seen. The bladder is unremarkable. Stomach/Bowel: Mild sigmoid diverticulosis without superimposed acute inflammatory change. The stomach, small bowel, and large bowel are otherwise unremarkable. Appendix normal. No free intraperitoneal gas or fluid. Vascular/Lymphatic: Mild aortoiliac atherosclerotic calcification. No aortic aneurysm. No pathologic adenopathy within the abdomen and pelvis. Reproductive: Status post hysterectomy. No adnexal masses. Other: Small fat containing umbilical hernia. Musculoskeletal: Osseous structures are age-appropriate. No acute bone abnormality. No lytic or blastic bone lesion. IMPRESSION: 1. No acute intra-abdominal pathology identified. No definite radiographic explanation for the patient's reported symptoms. 2. Coronary artery calcifications. 3. Mild sigmoid diverticulosis without superimposed acute inflammatory change. Aortic Atherosclerosis (ICD10-I70.0). Electronically Signed   By: Fidela Salisbury M.D.   On: 01/18/2022 22:03   DG Neck Soft Tissue  Result Date: 01/07/2022 CLINICAL DATA:  Cough. EXAM: NECK SOFT TISSUES - 1+ VIEW COMPARISON:  None Available. FINDINGS: There is no evidence of retropharyngeal soft tissue swelling or epiglottic enlargement. The cervical airway is unremarkable and no radio-opaque foreign body identified. IMPRESSION:  Negative. Electronically Signed   By: Marijo Conception M.D.   On: 01/07/2022 10:51      Discharge Exam: Blood pressure (!) 158/90, pulse (!) 58, temperature 98.1 F (36.7 C), temperature source Oral, resp. rate 18, height 5\' 2"  (1.575 m), weight 49.7 kg, SpO2 100 %.  Alert and oriented x 4 PERRLA Speech clear and fluent CN II-XII grossly intact MAE, Strength and sensation intact  Disposition: Discharge disposition: 06-Home-Health Care Svc       Discharge Instructions     Diet - low sodium heart healthy   Complete by: As directed  Discharge instructions   Complete by: As directed    Please be sure to follow-up with your primary care provider within 1 week after discharge.  You will need to have repeat blood work done to check your potassium and sodium levels and kidney function.  Seek attention if symptoms worsen.   Increase activity slowly   Complete by: As directed       Allergies as of 01/28/2022       Reactions   Amoxicillin-pot Clavulanate    REACTION: Rash on tongue   Azithromycin Other (See Comments)   diarrhea   Codeine Nausea Only   Delsym [dextromethorphan]    GI upset.   Doxycycline    GI intolerance   Ibandronate Sodium    REACTION: GI side effects   Iohexol     Desc: HIVES,NASAL CONGESTION DURING A CARDIAC CATH. REQUIRES PRE-MEDS.   Raloxifene    REACTION: GI upset   Sulfonamide Derivatives    REACTION: itching   Zoledronic Acid    REACTION: unable to take this as she was intolerant of pre-tx calcium and vitamin D   Iodinated Contrast Media Rash        Medication List     STOP taking these medications    aspirin 81 MG tablet   benzonatate 100 MG capsule Commonly known as: Tessalon Perles   hydrochlorothiazide 25 MG tablet Commonly known as: HYDRODIURIL   HYDROcodone-acetaminophen 7.5-325 mg/15 ml solution Commonly known as: HYCET       TAKE these medications    amLODipine 10 MG tablet Commonly known as: NORVASC Take 0.5  tablets (5 mg total) by mouth daily. What changed:  medication strength how much to take   butalbital-acetaminophen-caffeine 50-325-40 MG tablet Commonly known as: FIORICET Take 1 tablet by mouth every 4 (four) hours as needed for headache.   carbamazepine 200 MG tablet Commonly known as: TEGRETOL TAKE 2 TABLETS IN THE MORNING AND 2 TABLETS AT NIGHT   dexlansoprazole 60 MG capsule Commonly known as: DEXILANT Take 60 mg by mouth daily.   docusate sodium 100 MG capsule Commonly known as: COLACE Take 1 capsule (100 mg total) by mouth 2 (two) times daily.   ezetimibe 10 MG tablet Commonly known as: ZETIA TAKE 1 TABLET EVERY DAY   famotidine 20 MG tablet Commonly known as: PEPCID Take 1 tablet (20 mg total) by mouth 2 (two) times daily.   gabapentin 100 MG capsule Commonly known as: NEURONTIN Take 1 capsule (100 mg total) by mouth daily as needed (for cough).   levocetirizine 5 MG tablet Commonly known as: XYZAL Take 5 mg by mouth daily.   lisinopril 20 MG tablet Commonly known as: ZESTRIL Take 1 tablet (20 mg total) by mouth daily. Start taking on: January 29, 2022   metoCLOPramide 5 MG tablet Commonly known as: REGLAN Take 1 tablet (5 mg total) by mouth 3 (three) times daily before meals for 10 days.   metoprolol tartrate 50 MG tablet Commonly known as: LOPRESSOR Take 0.5 tablets (25 mg total) by mouth 2 (two) times daily.   ondansetron 8 MG tablet Commonly known as: ZOFRAN Take 8 mg by mouth 3 (three) times daily as needed for nausea or vomiting.   polyethylene glycol 17 g packet Commonly known as: MIRALAX / GLYCOLAX Take 17 g by mouth daily. Start taking on: January 29, 2022   promethazine 12.5 MG suppository Commonly known as: PHENERGAN Place 12.5 mg rectally every 6 (six) hours as needed for nausea or vomiting.  rosuvastatin 40 MG tablet Commonly known as: CRESTOR Take 1 tablet (40 mg total) by mouth daily.   spironolactone 25 MG tablet Commonly  known as: ALDACTONE TAKE 1 TABLET BY MOUTH EVERY DAY   traZODone 50 MG tablet Commonly known as: DESYREL Take 1 tablet (50 mg total) by mouth at bedtime.        Follow-up Information     [facility] Follow up.   Why: Someone at [facility] will call to arrange home health visits Contact information: [address] [place] [address] [phone]         Joaquim Nam, MD. Schedule an appointment as soon as possible for a visit in 1 week(s).   Specialty: Family Medicine Why: post hospitalization follow up Contact information: [address] [place] [address] [phone]         Julio Sicks, MD Follow up.   Specialty: Neurosurgery Why: For work note, Administrator, sports at extension [phone]. Follow up as needed. Contact information: [address] [place] [address] [phone]                 Signed: Val Eagle, DNP, AGNP-C Nurse Practitioner  [facility] [address], [address], [place], [place] [address] P: [phone]    F: [phone]  01/28/2022, 10:36 AM

## 2022-01-28 NOTE — Progress Notes (Signed)
Mobility Specialist - Progress Note   01/28/22 1125  Mobility  Activity Ambulated with assistance in hallway  Level of Assistance Standby assist, set-up cues, supervision of patient - no hands on  Assistive Device Front wheel walker  Distance Ambulated (ft) 200 ft  Activity Response Tolerated well  Mobility Referral Yes  $Mobility charge 1 Mobility    Pt received in bed agreeable to mobility. Tolerated increased distance well. Left in bed w/ all needs met.   [place] Specialist Please contact via SecureChat or Rehab office at [phone]

## 2022-01-28 NOTE — Progress Notes (Signed)
[facility] PROGRESS NOTE   Karen Saunders:[id number] DOB: 1955-04-13 DOA: 01/18/2022  PCP: Joaquim Nam, MD  Brief History/Interval Summary: 67 year old with history of CAD, HTN, HLD who had COVID about a month ago admitted to the hospital for traumatic Sub arachnoid hemorrhage after multiple falls.  She has been managed by neurosurgery but medical team was consulted for nausea and vomiting.  Off-and-on having nausea but was able to tolerate full liquid diet.  KUB shows nonobstructive pattern.    Subjective/Interval History: Patient did not have any episodes of vomiting in the last 24 hours.  Overall she feels better.  Tolerated some diet yesterday.  Denies any abdominal pain.  No acid reflux.  Assessment/Plan:  Nausea and vomiting No obvious GI cause identified.  Patient does mention a history of acid reflux.  She was placed on PPI and Carafate.  Abdomen has been benign on examination.  KUB did not show any acute findings.  Esophagogram does not show any strictures.  Recently done CT scan did not show any acute findings. Symptoms appear to be most likely due to a neurological process and headache. Patient was placed on scheduled metoclopramide.  She was started on Fioricet by neurosurgery.  Symptoms seem to be better today. From our standpoint she is stable for discharge.  We will send prescriptions for metoclopramide to her pharmacy.  Traumatic subarachnoid hemorrhage/headaches Management per neurosurgery.  Hyponatremia/hypokalemia/hypomagnesemia/dehydration Patient was started on IV fluids due to element of dehydration from poor oral intake.  Sodium level has improved.  Her diuretics were also held. Potassium level has improved.  Renal function has improved. Continue to encourage oral intake.  She is noted to be on both spironolactone and HCTZ.  Will discontinue HCTZ for now.  She needs to follow-up with her primary care provider and have repeat labs done in a week or  so.    Accelerated hypertension Blood pressures were quite elevated.  Headache was contributing to elevated blood pressure readings.  HCTZ and spironolactone had to be held due to electrolyte abnormalities.  Okay to resume spironolactone at discharge.  Lisinopril has been initiated and can be continued at discharge.  Amlodipine at 10 mg daily.  Continue with metoprolol at previous dose.  Prescriptions for new medications have been sent to her pharmacy.    Constipation Continue bowel regimen.  History of coronary artery disease status post CABG Stable.  Hyperlipidemia Continue statin.   DVT Prophylaxis: SCDs Code Status: Full code  Patient is stable and has improved.  Our recommendations are outlined above.  All new medication prescriptions have been sent to her pharmacy.  Will defer discharge to neurosurgery.  We will be available to answer any questions going forward.    Medications: Scheduled:  amLODipine  10 mg Oral Daily   carbamazepine  400 mg Oral BID   Chlorhexidine Gluconate Cloth  6 each Topical Daily   docusate sodium  100 mg Oral BID   ezetimibe  10 mg Oral Daily   famotidine  20 mg Oral BID   lisinopril  20 mg Oral Daily   loratadine  10 mg Oral Daily   metoCLOPramide (REGLAN) injection  5 mg Intravenous TID AC   metoprolol tartrate  25 mg Oral BID   pantoprazole  40 mg Oral BID   polyethylene glycol  17 g Oral Daily   rosuvastatin  40 mg Oral Daily   sucralfate  1 g Oral TID WC & HS   traZODone  50 mg Oral QHS  Continuous:  SWN:[id number] **OR** acetaminophen, butalbital-acetaminophen-caffeine, gabapentin, guaiFENesin, hydrALAZINE, HYDROmorphone (DILAUDID) injection, metoprolol tartrate, ondansetron **OR** ondansetron (ZOFRAN) IV, mouth rinse, prochlorperazine, promethazine, senna-docusate  Antibiotics: Anti-infectives (From admission, onward)    None       Objective:  Vital Signs  Vitals:   01/27/22 1538 01/27/22 2050 01/28/22 0347 01/28/22  0803  BP: 139/85 139/83 (!) 156/88 (!) 158/90  Pulse: 69 70 61 (!) 58  Resp: 17 17 17 18   Temp: 98 F (36.7 C) 98.9 F (37.2 C) 98.3 F (36.8 C) 98.1 F (36.7 C)  TempSrc: Oral Oral Oral Oral  SpO2: 100% 100% 97% 100%  Weight:      Height:        Intake/Output Summary (Last 24 hours) at 01/28/2022 0912 Last data filed at 01/28/2022 0406 Gross per 24 hour  Intake 1300 ml  Output --  Net 1300 ml    Filed Weights   01/19/22 2131 01/21/22 0108  Weight: 49 kg 49.7 kg    General appearance: Awake alert.  In no distress Resp: Clear to auscultation bilaterally.  Normal effort Cardio: S1-S2 is normal regular.  No S3-S4.  No rubs murmurs or bruit GI: Abdomen is soft.  Nontender nondistended.  Bowel sounds are present normal.  No masses organomegaly    Lab Results:  Data Reviewed: I have personally reviewed following labs and reports of the imaging studies  CBC: Recent Labs  Lab 01/23/22 1123 01/26/22 1010 01/28/22 0344  WBC 2.7* 2.3* 3.6*  NEUTROABS 2.1  --   --   HGB 10.5* 10.7* 9.9*  HCT 29.6* 29.1* 31.7*  MCV 88.4 86.1 86.8  PLT 111* 143* 173     Basic Metabolic Panel: Recent Labs  Lab 01/23/22 1123 01/26/22 1010 01/27/22 0512 01/28/22 0812  NA 135 129* 130* 133*  K 3.3* 3.2* 3.8 3.9  CL 99 89* 95* 99  CO2 23 28 24 23   GLUCOSE 83 88 83 88  BUN 13 10 12 13   CREATININE 1.01* 1.01* 1.19* 1.05*  CALCIUM 9.0 9.0 9.1 9.1  MG  --  1.4* 2.3  --      GFR: Estimated Creatinine Clearance: 40.2 mL/min (A) (by C-G formula based on SCr of 1.05 mg/dL (H)).  Liver Function Tests: Recent Labs  Lab 01/23/22 1123 01/26/22 1010  AST 60* 36  ALT 29 21  ALKPHOS 72 75  BILITOT 0.6 0.3  PROT 6.4* 6.4*  ALBUMIN 3.6 3.7      CBG: Recent Labs  Lab 01/26/22 2357 01/27/22 0832 01/27/22 1535 01/28/22 0018 01/28/22 0801  GLUCAP 94 121* 122* 100* 96      Recent Results (from the past 240 hour(s))  Resp panel by RT-PCR (RSV, Flu A&B, Covid) Anterior  Nasal Swab     Status: None   Collection Time: 01/18/22  7:12 PM   Specimen: Anterior Nasal Swab  Result Value Ref Range Status   SARS Coronavirus 2 by RT PCR NEGATIVE NEGATIVE Final    Comment: (NOTE) SARS-CoV-2 target nucleic acids are NOT DETECTED.  The SARS-CoV-2 RNA is generally detectable in upper respiratory specimens during the acute phase of infection. The lowest concentration of SARS-CoV-2 viral copies this assay can detect is 138 copies/mL. A negative result does not preclude SARS-Cov-2 infection and should not be used as the sole basis for treatment or other patient management decisions. A negative result may occur with  improper specimen collection/handling, submission of specimen other than nasopharyngeal swab, presence of viral mutation(s) within the areas targeted  by this assay, and inadequate number of viral copies(<138 copies/mL). A negative result must be combined with clinical observations, patient history, and epidemiological information. The expected result is Negative.  Fact Sheet for Patients:  EntrepreneurPulse.com.au  Fact Sheet for Healthcare Providers:  IncredibleEmployment.be  This test is no t yet approved or cleared by the [place] FDA and  has been authorized for detection and/or diagnosis of SARS-CoV-2 by FDA under an Emergency Use Authorization (EUA). This EUA will remain  in effect (meaning this test can be used) for the duration of the COVID-19 declaration under Section 564(b)(1) of the Act, 21 U.S.C.section 360bbb-3(b)(1), unless the authorization is terminated  or revoked sooner.       Influenza A by PCR NEGATIVE NEGATIVE Final   Influenza B by PCR NEGATIVE NEGATIVE Final    Comment: (NOTE) The Xpert Xpress SARS-CoV-2/FLU/RSV plus assay is intended as an aid in the diagnosis of influenza from Nasopharyngeal swab specimens and should not be used as a sole basis for treatment. Nasal washings  and aspirates are unacceptable for Xpert Xpress SARS-CoV-2/FLU/RSV testing.  Fact Sheet for Patients: EntrepreneurPulse.com.au  Fact Sheet for Healthcare Providers: IncredibleEmployment.be  This test is not yet approved or cleared by the [place] FDA and has been authorized for detection and/or diagnosis of SARS-CoV-2 by FDA under an Emergency Use Authorization (EUA). This EUA will remain in effect (meaning this test can be used) for the duration of the COVID-19 declaration under Section 564(b)(1) of the Act, 21 U.S.C. section 360bbb-3(b)(1), unless the authorization is terminated or revoked.     Resp Syncytial Virus by PCR NEGATIVE NEGATIVE Final    Comment: (NOTE) Fact Sheet for Patients: EntrepreneurPulse.com.au  Fact Sheet for Healthcare Providers: IncredibleEmployment.be  This test is not yet approved or cleared by the [place] FDA and has been authorized for detection and/or diagnosis of SARS-CoV-2 by FDA under an Emergency Use Authorization (EUA). This EUA will remain in effect (meaning this test can be used) for the duration of the COVID-19 declaration under Section 564(b)(1) of the Act, 21 U.S.C. section 360bbb-3(b)(1), unless the authorization is terminated or revoked.  Performed at [facility], [place] [address], [place], [place] [address]   MRSA Next Gen by PCR, Nasal     Status: None   Collection Time: 01/21/22  1:11 AM   Specimen: Nasal Mucosa; Nasal Swab  Result Value Ref Range Status   MRSA by PCR Next Gen NOT DETECTED NOT DETECTED Final    Comment: (NOTE) The GeneXpert MRSA Assay (FDA approved for NASAL specimens only), is one component of a comprehensive MRSA colonization surveillance program. It is not intended to diagnose MRSA infection nor to guide or monitor treatment for MRSA infections. Test performance is not FDA approved in patients less than 31  years old. Performed at [facility], [place] [address], [place], [place] [address]       Radiology Studies: No results found.     LOS: 10 days   Insiya Oshea [employer] on [email or web address]  01/28/2022, 9:12 AM

## 2022-01-28 NOTE — Care Management Important Message (Signed)
Important Message  Patient Details  Name: Karen Saunders MRN: [id number] Date of Birth: 08-23-1955   Medicare Important Message Given:  Yes     Hannah Beat 01/28/2022, 2:41 PM

## 2022-01-28 NOTE — Progress Notes (Signed)
Physical Therapy Treatment Patient Details Name: Karen Saunders MRN: [id number] DOB: 1955-06-28 Today's Date: 01/28/2022   History of Present Illness Pt is a 67 y/o female admitted with traumatic SAH s/p falls at home 2 diarrhea and dehydration. PMH significant for anemia, CAD, gastic ulcer, HTN, IBS, leukopenia, migraines, Osteoporosis, trigeminal neuralgia    PT Comments    Focus of session was HEP instruction and gait training. Pt appears fatigued this session, with flat affect and reporting nausea has returned. Pt dressed and anticipating d/c home this afternoon. Limited mobility as to conserve pt's energy for transition home. Reviewed car transfer, activity progression, and HEP. Will continue to follow until d/c.     Recommendations for follow up therapy are one component of a multi-disciplinary discharge planning process, led by the attending physician.  Recommendations may be updated based on patient status, additional functional criteria and insurance authorization.  Follow Up Recommendations  Home health PT (with sister support)     Assistance Recommended at Discharge Intermittent Supervision/Assistance  Patient can return home with the following A little help with walking and/or transfers;A little help with bathing/dressing/bathroom;Assistance with cooking/housework;Assist for transportation;Help with stairs or ramp for entrance   Equipment Recommendations  Rolling walker (2 wheels)    Recommendations for Other Services Rehab consult     Precautions / Restrictions Precautions Precautions: Fall Precaution Comments: Frequent vomiting Restrictions Weight Bearing Restrictions: No     Mobility  Bed Mobility               General bed mobility comments: Pt was received sitting up on EOB    Transfers Overall transfer level: Needs assistance Equipment used: Rolling walker (2 wheels) Transfers: Sit to/from Stand Sit to Stand: Supervision           General  transfer comment: Supervision for safety. VC's for hand placement on seated surface for safety. Pt holding to lower bar on RW instead of handle at the top once standing. Required cues to place hands in appropriate place.    Ambulation/Gait Ambulation/Gait assistance: Supervision Gait Distance (Feet): 275 Feet Assistive device: Rolling walker (2 wheels) Gait Pattern/deviations: Step-through pattern, Decreased stride length, Trunk flexed Gait velocity: Decreased Gait velocity interpretation: <1.31 ft/sec, indicative of household ambulator   General Gait Details: Slow but generally steady without overt LOB. Pt able to make corrective changes to improve posture and hold walker closer.   Stairs             Wheelchair Mobility    Modified Rankin (Stroke Patients Only)       Balance Overall balance assessment: No apparent balance deficits (not formally assessed) Sitting-balance support: Feet unsupported, No upper extremity supported Sitting balance-Leahy Scale: Fair Sitting balance - Comments: stiting EOB   Standing balance support: Bilateral upper extremity supported, During functional activity, Reliant on assistive device for balance Standing balance-Leahy Scale: Poor Standing balance comment: tendency for posterior bias, pt-corrected this session with use of AD                            Cognition Arousal/Alertness: Awake/alert Behavior During Therapy: Flat affect Overall Cognitive Status: Impaired/Different from baseline Area of Impairment: Safety/judgement                         Safety/Judgement: Decreased awareness of safety, Decreased awareness of deficits Awareness: Anticipatory            Exercises Other Exercises Other  Exercises: Pt was educated on HEP ([place]). Bridges, SLR, LAQ, HS curls, repeated sit<>stand, scapular retraction.    General Comments        Pertinent Vitals/Pain Pain Assessment Pain Assessment:  Faces Faces Pain Scale: No hurt Pain Intervention(s): Limited activity within patient's tolerance, Monitored during session, Repositioned    Home Living                          Prior Function            PT Goals (current goals can now be found in the care plan section) Acute Rehab PT Goals Patient Stated Goal: Return home today PT Goal Formulation: With patient Time For Goal Achievement: 02/04/22 Potential to Achieve Goals: Good Progress towards PT goals: Progressing toward goals    Frequency    Min 4X/week      PT Plan Current plan remains appropriate    Co-evaluation              AM-PAC PT "6 Clicks" Mobility   Outcome Measure  Help needed turning from your back to your side while in a flat bed without using bedrails?: A Little Help needed moving from lying on your back to sitting on the side of a flat bed without using bedrails?: A Little Help needed moving to and from a bed to a chair (including a wheelchair)?: A Little Help needed standing up from a chair using your arms (e.g., wheelchair or bedside chair)?: A Little Help needed to walk in hospital room?: A Little Help needed climbing 3-5 steps with a railing? : A Little 6 Click Score: 18    End of Session Equipment Utilized During Treatment: Gait belt Activity Tolerance: Patient tolerated treatment well Patient left: in chair;with call bell/phone within reach;with family/visitor present Nurse Communication: Mobility status PT Visit Diagnosis: Unsteadiness on feet (R26.81);Other symptoms and signs involving the nervous system (R29.898)     Time: 7902-4097 PT Time Calculation (min) (ACUTE ONLY): 13 min  Charges:  $Therapeutic Exercise: 8-22 mins                     Rolinda Roan, PT, DPT Acute Rehabilitation Services Secure Chat Preferred Office: [phone]    Thelma Comp 01/28/2022, 2:07 PM

## 2022-01-28 NOTE — Progress Notes (Signed)
Pt discharged to home. DC instructions given with son at bedside. No concerns voiced. Pt and son encouraged to stop by pharmacy and pick up meds that were e-prescribed by MD. Both verbalized understanding. Left unit in wheelchair pushed by hospital volunteer. Left in stable condition.

## 2022-01-29 ENCOUNTER — Other Ambulatory Visit: Payer: Self-pay | Admitting: Family Medicine

## 2022-01-31 ENCOUNTER — Telehealth: Payer: Self-pay | Admitting: *Deleted

## 2022-01-31 ENCOUNTER — Telehealth: Payer: Self-pay

## 2022-01-31 NOTE — Patient Outreach (Signed)
  Care Coordination [facility] Note Transition Care Management Unsuccessful Follow-up Telephone Call  Date of discharge and from where:  [phone] [facility]   Attempts:  1st Attempt  Reason for unsuccessful TCM follow-up call:  Left voice message  [facility] [phone]

## 2022-01-31 NOTE — Telephone Encounter (Signed)
Transition Care Management Follow-up Telephone Call Date of discharge and from where: [facility] 1.19.24 How have you been since you were released from the hospital? Feeling better now than before Any questions or concerns? No  Items Reviewed: Did the pt receive and understand the discharge instructions provided? Yes  Medications obtained and verified? Yes  Other? Yes  Any new allergies since your discharge? No  Dietary orders reviewed? Yes Do you have support at home? Yes  Functional Questionnaire: (I = Independent and D = Dependent) ADLs: I  Bathing/Dressing- I  Meal Prep- I  Eating- I  Maintaining continence- I  Transferring/Ambulation- I  Managing Meds- I  Follow up appointments reviewed:  PCP Hospital f/u appt confirmed? Yes  Scheduled to see Dr. [name] on 1.29 @ [facility] f/u appt confirmed? No  Scheduled to see  on  @ . Are transportation arrangements needed? Yes  If their condition worsens, is the pt aware to call PCP or go to the Emergency Dept.? Yes Was the patient provided with contact information for the PCP's office or ED? Yes Was to pt encouraged to call back with questions or concerns? Yes

## 2022-02-01 ENCOUNTER — Encounter: Payer: Self-pay | Admitting: Family Medicine

## 2022-02-02 ENCOUNTER — Other Ambulatory Visit: Payer: Self-pay | Admitting: Family Medicine

## 2022-02-02 MED ORDER — PROMETHAZINE HCL 12.5 MG RE SUPP: 12.5000 mg | Freq: Four times a day (QID) | RECTAL | 1 refills | Status: DC | PRN

## 2022-02-02 MED ORDER — ONDANSETRON HCL 8 MG PO TABS: 8.0000 mg | ORAL_TABLET | Freq: Three times a day (TID) | ORAL | 1 refills | Status: DC | PRN

## 2022-02-07 ENCOUNTER — Inpatient Hospital Stay: Payer: Medicare Other | Admitting: Family Medicine

## 2022-02-08 ENCOUNTER — Encounter: Payer: Self-pay | Admitting: Family Medicine

## 2022-02-08 ENCOUNTER — Encounter (HOSPITAL_COMMUNITY): Payer: Medicare Other

## 2022-02-08 ENCOUNTER — Ambulatory Visit (INDEPENDENT_AMBULATORY_CARE_PROVIDER_SITE_OTHER): Payer: Medicare Other | Admitting: Family Medicine

## 2022-02-08 VITALS — BP 165/88 | HR 55 | Temp 97.9°F | Ht 62.0 in | Wt 115.2 lb

## 2022-02-08 DIAGNOSIS — D649 Anemia, unspecified: Secondary | ICD-10-CM

## 2022-02-08 DIAGNOSIS — I2510 Atherosclerotic heart disease of native coronary artery without angina pectoris: Secondary | ICD-10-CM | POA: Diagnosis not present

## 2022-02-08 DIAGNOSIS — S066X1A Traumatic subarachnoid hemorrhage with loss of consciousness of 30 minutes or less, initial encounter: Secondary | ICD-10-CM

## 2022-02-08 DIAGNOSIS — K219 Gastro-esophageal reflux disease without esophagitis: Secondary | ICD-10-CM

## 2022-02-08 DIAGNOSIS — M7989 Other specified soft tissue disorders: Secondary | ICD-10-CM | POA: Diagnosis not present

## 2022-02-08 DIAGNOSIS — I10 Essential (primary) hypertension: Secondary | ICD-10-CM

## 2022-02-08 DIAGNOSIS — I1A0 Resistant hypertension: Secondary | ICD-10-CM

## 2022-02-08 DIAGNOSIS — R112 Nausea with vomiting, unspecified: Secondary | ICD-10-CM

## 2022-02-08 DIAGNOSIS — E871 Hypo-osmolality and hyponatremia: Secondary | ICD-10-CM

## 2022-02-08 DIAGNOSIS — H9319 Tinnitus, unspecified ear: Secondary | ICD-10-CM

## 2022-02-08 MED ORDER — METOCLOPRAMIDE HCL 5 MG PO TABS: 5.0000 mg | ORAL_TABLET | Freq: Three times a day (TID) | ORAL | 0 refills | Status: DC

## 2022-02-08 MED ORDER — BUTALBITAL-APAP-CAFFEINE 50-325-40 MG PO TABS: 1.0000 | ORAL_TABLET | ORAL | 0 refills | Status: DC | PRN

## 2022-02-08 NOTE — Assessment & Plan Note (Signed)
Pt states she was previously on 81 mg asa and this was held in hospital for [facility]  Will d/w pcp and /or cardiology re; when/if to safely re start

## 2022-02-08 NOTE — Patient Instructions (Signed)
I want to order an ultrasound of your leg  Also do labs today   If symptoms worsen -go to ER   Keep [place] posted Keep watching blood pressure    I will review records and talk to Dr [name] when I can

## 2022-02-08 NOTE — Assessment & Plan Note (Signed)
Recent hosp for n/v and also SAH due to falls/trauma  She has improved but still feels nauseated Taking phenergan and zofran and also reglan  Reviewed hospital records, lab results and studies in detail  No GI source was ID for the symptoms  Dg esoph/swallow , CT abd/pel and abd xr reviewed  Pt thinks the IV bp meds made her worse  Pt thinks the fioricet is not making her worse   Today has improved some No longer vomiting Labs pending For pcp follow up

## 2022-02-08 NOTE — Assessment & Plan Note (Addendum)
Recent hosp for [facility] and bp was difficult to control  Unclear whether cardene and or hydralazine caused worsened nausea (per pt it did)  Was d/c home off hctz due to low na Continues amlodipine that was inc from 5 to 10 Lisinopril 20 mg (new)  Metoprolol 25 mg bid Spironolactone 25 mg daily   Labs pending for chem  Bp is now improving but not controlled (in setting of SAH)  BP: (!) 165/88  She continues to monitor at home  Not dizzy but c/o being off balance and weak Pending lab for plan  F;u planned with pcp when he returns on monday

## 2022-02-08 NOTE — Assessment & Plan Note (Addendum)
Thought to be from traumatic fall  Reviewed hospital records, lab results and studies in detail  Scattered on CT Trace acute subdural hem noted along R tentorium (no mass effect)  No definite aneurysmal source was mentioned in hospital records and neuro surgery did follow  No discreet rec re: neuro surg f/u recommendation was made  In ICU for brief time Continues with headache, elevated bp now- very slowly improving Fioricet refilled for pain  Per hospital record unclear whether n/v was due to this or another cause Will need close follow up ER precautions discussed in detail

## 2022-02-08 NOTE — Progress Notes (Signed)
Subjective:    Patient ID: Karen Saunders, female    DOB: 28-Aug-1955, 67 y.o.   MRN: [id number]  HPI 67 yo [name] presents for hospital follow up   Wt Readings from Last 3 Encounters:  02/08/22 115 lb 4 oz (52.3 kg)  01/21/22 109 lb 9.1 oz (49.7 kg)  01/07/22 110 lb (49.9 kg)   21.08 kg/m  Vitals:   02/08/22 1420  BP: (!) 180/86  Pulse: (!) 55  Temp: 97.9 F (36.6 C)  SpO2: 97%   Pt was hospitalized from 1/9 to 1/19 for traumatic SAH   Per pt she fell and hit her head  Got up to go to the bathroom and slipped and fell, tried to get up and fell again  Laid there and then able to pull up and reach phone   Hospital course Hospital Course: Patient was admitted with nausea, vomiting, and headache after a one week reported history of gastrointestinal viral symptoms. Imaging revealed scattered acute subarachnoid hemorrhage involving the bilateral cerebral hemispheres, most pronounced at the frontal lobes near the vertex. The patient was admitted for observation. Her hospital course was complicated by persistent nausea, vomiting, and hypertension. Her symptoms are now controlled by phenergan, Zofran, and Fioricet. She has been advised to follow up with her PCP one week post discharge.  CT head IMPRESSION: 1. Scattered acute subarachnoid hemorrhage involving the bilateral cerebral hemispheres, most pronounced at the frontal lobes near the vertex. 2. Trace acute subdural hemorrhage along the right tentorium, measuring 3 mm in maximal thickness. Additional possible trace extra-axial component overlying the left cerebral convexity. No mass effect. 3. Underlying mild chronic microvascular ischemic disease. Critical Value/emergent results were called by telephone at the time of interpretation on 01/18/2022 at 10:30 pm to provider [facility] , who verbally acknowledged these results. Electronically Signed By: Jeannine Boga M.D. On: 01/18/2022 22:33   Dg esoph/swallow noted  mild esophageal dysmotility Nl abd xr  She was placed on ppi and carafate  Then metoclopramide   Fioricet from neurosurg for pain   Ct abd /pelvis IMPRESSION: 1. No acute intra-abdominal pathology identified. No definite radiographic explanation for the patient's reported symptoms. 2. Coronary artery calcifications. 3. Mild sigmoid diverticulosis without superimposed acute inflammatory change. Aortic Atherosclerosis (ICD10-I70.0). Electronically Signed By: Fidela Salisbury M.D   [facility] was consulted They have reached out- but not visited yet   Has f/u planned  DR [facility] -neuro surgery  She needs an appt   Has h/o HTN and CAD Resistant HTN is on med list   BP Readings from Last 3 Encounters:  02/08/22 (!) 165/88  01/28/22 (!) 158/90  01/07/22 (!) 181/97   Pulse Readings from Last 3 Encounters:  02/08/22 (!) 55  01/28/22 (!) 58  01/07/22 (!) 52     Amlodipine 10 mg daily  Lisinopril 20 mg daily   Metoprolol 25 mg bid  Spironolactone 25 mg daily   Hctz was d/c in hospital for low na/ mag/k   Hydralazine in hosp= made her very nauseated  Was on cardene drip in the hospital   No missed doses since coming home    Lab Results  Component Value Date   CREATININE 1.05 (H) 01/28/2022   BUN 13 01/28/2022   NA 133 (L) 01/28/2022   K 3.9 01/28/2022   CL 99 01/28/2022   CO2 23 01/28/2022   Lab Results  Component Value Date   ALT 21 01/26/2022   AST 36 01/26/2022   ALKPHOS  75 01/26/2022   BILITOT 0.3 01/26/2022   Lab Results  Component Value Date   WBC 3.6 (L) 01/28/2022   HGB 9.9 (L) 01/28/2022   HCT 31.7 (L) 01/28/2022   MCV 86.8 01/28/2022   PLT 173 01/28/2022   Now  Wakes up with nausea 3-4 am  Then takes the generic for zofran and phenergan -then tries to go back to sleep Never really gets a full sleep , then gets up at 5  Then nausea is not quite as bad as day goes on  Taking reglan before she eats and it helps some   Has a headache every day 2-3 times per  day  They last about 2 hours and go away after she sleeps Not dizzy , but balance is bad  Is being really careful   Sister and son are staying with her   She is drinking fluids Cranberry juice and tea and some water   Appetite is better than when in the hospital  Ate some toast and jelly this am   Her R leg is swollen since the fall (they were aware she had this in the hospital)  Thinks she injured that leg with the fall  Helps to put pressure on it    She takes carbamazapine baseline for ear clicks (from ENT originally?)  ? H/o trigeminal neuralgia   Patient Active Problem List   Diagnosis Date Noted   Right leg swelling 02/08/2022   Nausea & vomiting 01/23/2022   Resistant hypertension 01/21/2022   Subarachnoid hemorrhage due to ruptured aneurysm (HCC) 01/20/2022   Traumatic subarachnoid bleed with LOC of 30 minutes or less, initial encounter (HCC) 01/19/2022   Hyponatremia 10/20/2021   Upper airway cough syndrome 07/22/2021   Sinus bradycardia 06/27/2021   Osteopenia 05/19/2021   Insomnia 12/13/2018   Essential hypertension 01/30/2018   Hx of CABG 01/30/2018   Advance care planning 12/19/2017   Ear clicks 12/19/2017   Low back pain 07/23/2017   Occipital pain 04/20/2016   GERD (gastroesophageal reflux disease) 11/24/2015   Herniated nucleus pulposus, L3-4 right 11/24/2015   Lumbar spinal stenosis 11/24/2015   Vitiligo 01/23/2015   Right sided sciatica 08/04/2014   Nocturnal cough 09/29/2013   Cough 09/13/2012   History of gastric ulcer    Cervical disc disease    Medicare annual wellness visit, initial 11/10/2010   Anemia 11/10/2010   Osteoporosis    Migraine headache    Hypertensive heart disease    Coronary atherosclerosis 01/12/2010   IBS 01/12/2010   HLD (hyperlipidemia)    Past Medical History:  Diagnosis Date   Anemia    with prev unremarkable heme eval ~2011   CAD (coronary artery disease)    Dr. Donnie Aho with Cards   History of gastric ulcer     2005    Hyperlipidemia    Hypertension    IBS (irritable bowel syndrome)    Leukopenia    with prev unremarkable heme eval ~2011   Migraine    Unsp w/o intract w/o status migrainosus   Osteoporosis 11/18/2004   Shingles    Trigeminal neuralgia 03/05/2012   Past Surgical History:  Procedure Laterality Date   ABDOMINAL HYSTERECTOMY  2002   CORONARY ARTERY BYPASS GRAFT  2002   LUMBAR SPINE SURGERY     Stress cardiolite  09/11/2008   Probably normal with breast attenuation noted but no evidence of ischemia   TONSILLECTOMY  ~ 1965   Social History   Tobacco Use   Smoking  status: Never   Smokeless tobacco: Never  Vaping Use   Vaping Use: Never used  Substance Use Topics   Alcohol use: No    Alcohol/week: 0.0 standard drinks of alcohol   Drug use: No   Family History  Problem Relation Age of Onset   Arthritis Mother    Diabetes Mother    Hypertension Mother    Dementia Mother    Stroke Father    Heart disease Father        MI   Diabetes Father    Hypertension Father    Colon cancer Neg Hx    Breast cancer Neg Hx    Allergies  Allergen Reactions   Amoxicillin-Pot Clavulanate     REACTION: Rash on tongue   Azithromycin Other (See Comments)    diarrhea   Codeine Nausea Only   Delsym [Dextromethorphan]     GI upset.   Doxycycline     GI intolerance   Hydralazine     Nausea    Ibandronate Sodium     REACTION: GI side effects   Iohexol      Desc: HIVES,NASAL CONGESTION DURING A CARDIAC CATH. REQUIRES PRE-MEDS.    Raloxifene     REACTION: GI upset   Sulfonamide Derivatives     REACTION: itching   Zoledronic Acid     REACTION: unable to take this as she was intolerant of pre-tx calcium and vitamin D   Iodinated Contrast Media Rash   Current Outpatient Medications on File Prior to Visit  Medication Sig Dispense Refill   amLODipine (NORVASC) 10 MG tablet Take 0.5 tablets (5 mg total) by mouth daily. 30 tablet 0   carbamazepine (TEGRETOL) 200 MG tablet TAKE 2  TABLETS IN THE MORNING AND 2 TABLETS AT NIGHT     dexlansoprazole (DEXILANT) 60 MG capsule Take 60 mg by mouth daily.     docusate sodium (COLACE) 100 MG capsule Take 1 capsule (100 mg total) by mouth 2 (two) times daily. 30 capsule 0   ezetimibe (ZETIA) 10 MG tablet TAKE 1 TABLET EVERY DAY 90 tablet 3   famotidine (PEPCID) 20 MG tablet Take 1 tablet (20 mg total) by mouth 2 (two) times daily.     levocetirizine (XYZAL) 5 MG tablet TAKE 1 TABLET (5 MG TOTAL) BY MOUTH DAILY. 90 tablet 4   lisinopril (ZESTRIL) 20 MG tablet Take 1 tablet (20 mg total) by mouth daily. 30 tablet 0   metoprolol tartrate (LOPRESSOR) 50 MG tablet Take 0.5 tablets (25 mg total) by mouth 2 (two) times daily.     ondansetron (ZOFRAN) 8 MG tablet Take 1 tablet (8 mg total) by mouth 3 (three) times daily as needed for nausea or vomiting. 20 tablet 1   promethazine (PHENERGAN) 12.5 MG suppository Place 1 suppository (12.5 mg total) rectally every 6 (six) hours as needed for nausea or vomiting. 12 each 1   rosuvastatin (CRESTOR) 40 MG tablet Take 1 tablet (40 mg total) by mouth daily. 30 tablet 11   spironolactone (ALDACTONE) 25 MG tablet TAKE 1 TABLET BY MOUTH EVERY DAY 90 tablet 3   traZODone (DESYREL) 50 MG tablet Take 1 tablet (50 mg total) by mouth at bedtime. 90 tablet 1   No current facility-administered medications on file prior to visit.    Review of Systems  Constitutional:  Positive for fatigue. Negative for activity change, appetite change, fever and unexpected weight change.  HENT:  Negative for congestion, ear pain, rhinorrhea, sinus pressure and sore throat.  Eyes:  Negative for pain, redness and visual disturbance.  Respiratory:  Negative for cough, shortness of breath and wheezing.   Cardiovascular:  Positive for leg swelling. Negative for chest pain and palpitations.  Gastrointestinal:  Positive for nausea. Negative for abdominal distention, abdominal pain, anal bleeding, blood in stool, constipation,  diarrhea and vomiting.  Endocrine: Negative for polydipsia and polyuria.  Genitourinary:  Negative for dysuria, frequency and urgency.  Musculoskeletal:  Negative for arthralgias, back pain and myalgias.  Skin:  Negative for pallor and rash.  Allergic/Immunologic: Negative for environmental allergies.  Neurological:  Positive for light-headedness and headaches. Negative for dizziness, tremors, seizures, syncope, speech difficulty and numbness.       No focal weakness  Hematological:  Negative for adenopathy. Does not bruise/bleed easily.  Psychiatric/Behavioral:  Negative for decreased concentration and dysphoric mood. The patient is not nervous/anxious.        Objective:   Physical Exam Constitutional:      General: She is not in acute distress.    Appearance: Normal appearance. She is well-developed and normal weight. She is not ill-appearing or diaphoretic.  HENT:     Head: Normocephalic and atraumatic.     Right Ear: Tympanic membrane and ear canal normal.     Left Ear: Tympanic membrane and ear canal normal.     Nose: Nose normal.     Mouth/Throat:     Mouth: Mucous membranes are moist.  Eyes:     General: No scleral icterus.       Right eye: No discharge.        Left eye: No discharge.     Extraocular Movements: Extraocular movements intact.     Conjunctiva/sclera: Conjunctivae normal.     Pupils: Pupils are equal, round, and reactive to light.     Comments: No nystagmus  Neck:     Thyroid: No thyromegaly.     Vascular: No carotid bruit or JVD.  Cardiovascular:     Rate and Rhythm: Regular rhythm. Bradycardia present.     Heart sounds: Normal heart sounds.     No gallop.  Pulmonary:     Effort: Pulmonary effort is normal. No respiratory distress.     Breath sounds: Normal breath sounds. No stridor. No wheezing, rhonchi or rales.  Chest:     Chest wall: No tenderness.  Abdominal:     General: There is no distension or abdominal bruit.     Palpations: Abdomen is  soft. There is no mass.     Tenderness: There is no abdominal tenderness. There is no guarding or rebound.  Musculoskeletal:     Cervical back: Normal range of motion and neck supple. No rigidity or tenderness.     Right lower leg: Edema present.     Left lower leg: No edema.     Comments: Mild LLE swelling from just above the knee down  Non tender (bony or soft tissue) No palpable cords Nl rom  knee /ankle/foot  Lymphadenopathy:     Cervical: No cervical adenopathy.  Skin:    General: Skin is warm and dry.     Coloration: Skin is not jaundiced or pale.     Findings: No bruising, erythema or rash.  Neurological:     Mental Status: She is alert.     Cranial Nerves: Cranial nerves 2-12 are intact. No cranial nerve deficit, dysarthria or facial asymmetry.     Sensory: Sensation is intact.     Motor: No tremor, abnormal muscle tone, seizure  activity or pronator drift.     Coordination: Coordination normal. Finger-Nose-Finger Test normal.     Deep Tendon Reflexes: Reflexes are normal and symmetric. Reflexes normal.     Comments: No focal weakness (is generally weak) Too weak to stand for romberg test  Slow but steady gait   Psychiatric:        Mood and Affect: Mood normal.           Assessment & Plan:   Problem List Items Addressed This Visit       Cardiovascular and Mediastinum   Coronary atherosclerosis (Chronic)    Pt states she was previously on 81 mg asa and this was held in hospital for [facility]  Will d/w pcp and /or cardiology re; when/if to safely re start      Essential hypertension    Recent hosp for [facility] and bp was difficult to control  Unclear whether cardene and or hydralazine caused worsened nausea (per pt it did)  Was d/c home off hctz due to low na Continues amlodipine that was inc from 5 to 10 Lisinopril 20 mg (new)  Metoprolol 25 mg bid Spironolactone 25 mg daily   Labs pending for chem  Bp is now improving but not controlled (in setting of SAH)  BP:  (!) 165/88  She continues to monitor at home  Not dizzy but c/o being off balance and weak Pending lab for plan  F;u planned with pcp when he returns on monday      Resistant hypertension    Recent hosp for this and SAH  Had pain/distress which added to HA Reviewed hospital records, lab results and studies in detail    Was managed with cardene vs hydralazine- pt thinks she reacted to      Relevant Orders   Comprehensive metabolic panel   CBC with Differential/Platelet   Traumatic subarachnoid bleed with LOC of 30 minutes or less, initial encounter ([facility]) - Primary    Thought to be from traumatic fall  Reviewed hospital records, lab results and studies in detail  Scattered on CT Trace acute subdural hem noted along R tentorium (no mass effect)  No definite aneurysmal source was mentioned in hospital records and neuro surgery did follow  No discreet rec re: neuro surg f/u recommendation was made  In ICU for brief time Continues with headache, elevated bp now- very slowly improving Fioricet refilled for pain  Per hospital record unclear whether n/v was due to this or another cause Will need close follow up ER precautions discussed in detail         Relevant Orders   Comprehensive metabolic panel   CBC with Differential/Platelet     Digestive   GERD (gastroesophageal reflux disease)    Pt continues dexilant Recent hosp for n/v with SAH Per note took carafate briefly       Relevant Medications   metoCLOPramide (REGLAN) 5 MG tablet   Nausea & vomiting    Recent hosp for n/v and also SAH due to falls/trauma  She has improved but still feels nauseated Taking phenergan and zofran and also reglan  Reviewed hospital records, lab results and studies in detail  No GI source was ID for the symptoms  Dg esoph/swallow , CT abd/pel and abd xr reviewed  Pt thinks the IV bp meds made her worse  Pt thinks the fioricet is not making her worse   Today has improved some No longer  vomiting Labs pending For pcp follow up  Other   Anemia (Chronic)   Ear clicks    Pt takes tegretol baseline for this      Hyponatremia    Noted in hosp with SAH and n/v Hctz held  She still takes spironolactone  Lab today      Right leg swelling    Since hosp for [facility] Has been less mobile She did fall onto that side-may be due to trauma but no bony tenderness noted  Also amlodipine dose was inc to 10 - that may inc swelling but this is asymmetric  Holding her usual 81 mg asa for [facility] LE doppler ordered stat  ER precautions reviewed in great detail        Relevant Orders   VAS [place] LOWER EXTREMITY VENOUS (DVT)

## 2022-02-08 NOTE — Assessment & Plan Note (Signed)
Pt continues dexilant Recent hosp for n/v with [facility] Per note took carafate briefly

## 2022-02-08 NOTE — Assessment & Plan Note (Signed)
Pt takes tegretol baseline for this

## 2022-02-08 NOTE — Assessment & Plan Note (Signed)
Recent hosp for this and SAH  Had pain/distress which added to HA Reviewed hospital records, lab results and studies in detail    Was managed with cardene vs hydralazine- pt thinks she reacted to

## 2022-02-08 NOTE — Assessment & Plan Note (Signed)
Noted in hosp with SAH and n/v Hctz held  She still takes spironolactone  Lab today

## 2022-02-08 NOTE — Assessment & Plan Note (Signed)
Since hosp for [facility] Has been less mobile She did fall onto that side-may be due to trauma but no bony tenderness noted  Also amlodipine dose was inc to 10 - that may inc swelling but this is asymmetric  Holding her usual 81 mg asa for [facility] LE doppler ordered stat  ER precautions reviewed in great detail

## 2022-02-09 ENCOUNTER — Ambulatory Visit (HOSPITAL_COMMUNITY)
Admission: RE | Admit: 2022-02-09 | Discharge: 2022-02-09 | Disposition: A | Discharge status: Home / Self Care | Payer: Medicare Other | Source: Ambulatory Visit | Attending: Family Medicine | Admitting: Family Medicine

## 2022-02-09 ENCOUNTER — Telehealth: Payer: Self-pay | Admitting: Family Medicine

## 2022-02-09 DIAGNOSIS — M7989 Other specified soft tissue disorders: Secondary | ICD-10-CM

## 2022-02-09 LAB — CBC WITH DIFFERENTIAL/PLATELET
Basophils Absolute: 0 10*3/uL (ref 0.0–0.1)
Basophils Relative: 1.3 % (ref 0.0–3.0)
Eosinophils Absolute: 0 10*3/uL (ref 0.0–0.7)
Eosinophils Relative: 0.5 % (ref 0.0–5.0)
HCT: 30 % — ABNORMAL LOW (ref 36.0–46.0)
Hemoglobin: 10.3 g/dL — ABNORMAL LOW (ref 12.0–15.0)
Lymphocytes Relative: 40.3 % (ref 12.0–46.0)
Lymphs Abs: 1.2 10*3/uL (ref 0.7–4.0)
MCHC: 34.4 g/dL (ref 30.0–36.0)
MCV: 93 fl (ref 78.0–100.0)
Monocytes Absolute: 0.3 10*3/uL (ref 0.1–1.0)
Monocytes Relative: 11.9 % (ref 3.0–12.0)
Neutro Abs: 1.3 10*3/uL — ABNORMAL LOW (ref 1.4–7.7)
Neutrophils Relative %: 46 % (ref 43.0–77.0)
Platelets: 174 10*3/uL (ref 150.0–400.0)
RBC: 3.23 Mil/uL — ABNORMAL LOW (ref 3.87–5.11)
RDW: 13.6 % (ref 11.5–15.5)
WBC: 2.9 10*3/uL — ABNORMAL LOW (ref 4.0–10.5)

## 2022-02-09 LAB — COMPREHENSIVE METABOLIC PANEL
ALT: 19 U/L (ref 0–35)
AST: 28 U/L (ref 0–37)
Albumin: 4.6 g/dL (ref 3.5–5.2)
Alkaline Phosphatase: 72 U/L (ref 39–117)
BUN: 11 mg/dL (ref 6–23)
CO2: 31 mEq/L (ref 19–32)
Calcium: 9.7 mg/dL (ref 8.4–10.5)
Chloride: 101 mEq/L (ref 96–112)
Creatinine, Ser: 1.18 mg/dL (ref 0.40–1.20)
GFR: 47.53 mL/min — ABNORMAL LOW (ref >60.00)
Glucose, Bld: 84 mg/dL (ref 70–99)
Potassium: 4.4 mEq/L (ref 3.5–5.1)
Sodium: 140 mEq/L (ref 135–145)
Total Bilirubin: 0.3 mg/dL (ref 0.2–1.2)
Total Protein: 7.2 g/dL (ref 6.0–8.3)

## 2022-02-09 NOTE — Telephone Encounter (Signed)
I can see her on the 5th and recheck her BP.  We can recheck labs/adjust BP meds and reimage her if needed.  I have a low threshold to reimage her.  If not clearly better, then we can refer back to neurosurgery.    Janett Billow- please get update on Thursday re:  Nausea Headache BP

## 2022-02-09 NOTE — Telephone Encounter (Signed)
See result notes. 

## 2022-02-09 NOTE — Telephone Encounter (Signed)
Patient called in per Dr Glori Bickers and reported her b/p as 197/101

## 2022-02-10 ENCOUNTER — Encounter: Payer: Self-pay | Admitting: Family Medicine

## 2022-02-10 ENCOUNTER — Other Ambulatory Visit: Payer: Self-pay | Admitting: Family Medicine

## 2022-02-10 MED ORDER — HYDROCHLOROTHIAZIDE 25 MG PO TABS: 25.0000 mg | ORAL_TABLET | Freq: Every day | ORAL | Status: DC

## 2022-02-10 NOTE — Telephone Encounter (Signed)
Verified with pt that it was the same meds she will restart med today

## 2022-02-10 NOTE — Telephone Encounter (Signed)
Spoke with patient and she had just taken her BP not long ago. Has still be elevated; BP was 173/105. She did start back on the HCTZ today as advised. She still has the headaches. She stated that she wakes up every morning at 3 am and feels like someone hit her in the head with a baseball bat. She can not ever go back to sleep when she lays back down. Nausea is better unless she gets a really bad HA. Stated that it's not nearly as bad as it was when she was in the hospital.

## 2022-02-10 NOTE — Telephone Encounter (Signed)
Patient called in and would like to know if hctz is referring to medication hydrochlorothiazide,that Dr Glori Bickers would like for her to restart? Per lab notes

## 2022-02-10 NOTE — Telephone Encounter (Signed)
error 

## 2022-02-10 NOTE — Progress Notes (Signed)
Called PT ~5pm on 02/10/22.  Restarted HCTZ 25mg  today but likely hasn't had time for effect.  D/w pt about recheck BP at time of call.  211/119.  Recheck blood pressure 204/111 during the call.  Not having chest pain or focal neurologic symptoms. She has been resting most of the day.  We talked about concussion symptoms and her blood pressure management.  She felt worse when she was taking hydralazine in the hospital.  She attributed her symptoms to dropping her blood pressure too quickly.  We talked about her situation at this point and without focal neurologic symptoms it did not appear that she needs to be at the hospital but we need to get her blood pressure under better control.   I asked her to take her routine dose of lisinopril (20 mg along with an extra dose, ie 40mg . She did that, since she hadn't taken a dose yet.  I would expect her to have some gradual blood pressure improvement in the relatively near future.  Then patient realized she had already taking a dose of lisinopril today.  Given that, I did not think it was prudent to induce vomiting to try to remove the most recent doses of lisinopril.  I think if she can rest and stay hydrated over the evening and her blood pressure will likely improve some but not be all the way to goal.  Routine ER cautions given to patient in the meantime. She agreed.  We can check on the patient tomorrow morning and make plans at that point.  She agreed.

## 2022-02-11 ENCOUNTER — Telehealth: Payer: Self-pay | Admitting: Family Medicine

## 2022-02-11 NOTE — Telephone Encounter (Signed)
I would continue with her baseline meds as listed for now.   I would go back to taking lisinopril daily (she already had her dose today).  If her BP is above 498 systolic in the meantime, she can take an extra dose of lisinopril per day, ie an extra 20mg  per day.    I suspect her BP will continue to improve with the addition of HCTZ.  Will see Monday.    Thanks.

## 2022-02-11 NOTE — Telephone Encounter (Signed)
I spoke with pt; pt said 02/11/22 at 5:30 AM BP 174/103; at 7:20 AM BP 167/98 and at 8:25 AM BP 194/110 P 58 for this BP pt had walked upstairs to sit and take BP. Pt sat quietly for few mins and then recked BP 176/103 P 63.pt has not taken any meds this morning so far. Pt said has H/A every morning. Now H/A pain level is 5 which is not as bad a pain level as usual. Pt said was nauseated last night but no nausea so far this morning. No CP,SOB, vision changes or dizziness.pt has appt on 02/14/22 which pt plans on keeping with Dr [name]. Pt is going to take spironolactone 25 mg this morning, lisiniopril 20 mg this morning, HCTZ 25 mg this morning, Amlodipine 5 mg this morning and metoprolol 50 mg 1/2 tab this morning(pt takes metoprolol 50 mg 1/2 tab bid.)pt will cb in about an hour after taking meds with update on BP after taking meds. UC & ED precautions given and pt voiced understanding. Sending note to Dr [name].

## 2022-02-11 NOTE — Telephone Encounter (Signed)
Called patient reviewed all information and repeated back to me. Will call if any questions.  She will make adjustments as instructed and let us know if any issues. Verified appointment with patient.

## 2022-02-11 NOTE — Telephone Encounter (Signed)
Pt calling back and pt fell asleep after taking morning meds about 1 1/2 hours ago and just woke up. BP now at 11:05 AM BP 165/99 P 59.pt said she feels better since she got a nap. Pt will wait for Dr [name] to review the note and then pt request cb with further instructions. Sending note to Dr [name] and will [facility] CMA. If needed [facility].

## 2022-02-11 NOTE — Telephone Encounter (Signed)
Please call patient Friday morning and get update on blood pressure, headache, how she is feeling.  Please let me know.  Thanks.

## 2022-02-14 ENCOUNTER — Encounter: Payer: Self-pay | Admitting: Family Medicine

## 2022-02-14 ENCOUNTER — Telehealth: Payer: Self-pay

## 2022-02-14 ENCOUNTER — Ambulatory Visit (INDEPENDENT_AMBULATORY_CARE_PROVIDER_SITE_OTHER): Payer: Medicare Other | Admitting: Family Medicine

## 2022-02-14 ENCOUNTER — Telehealth: Payer: Self-pay | Admitting: Family Medicine

## 2022-02-14 VITALS — BP 144/70 | HR 56 | Temp 98.4°F | Ht 62.0 in | Wt 112.0 lb

## 2022-02-14 DIAGNOSIS — I609 Nontraumatic subarachnoid hemorrhage, unspecified: Secondary | ICD-10-CM

## 2022-02-14 DIAGNOSIS — I10 Essential (primary) hypertension: Secondary | ICD-10-CM

## 2022-02-14 DIAGNOSIS — R519 Headache, unspecified: Secondary | ICD-10-CM

## 2022-02-14 LAB — CBC WITH DIFFERENTIAL/PLATELET
Basophils Absolute: 0 10*3/uL (ref 0.0–0.1)
Basophils Relative: 0.4 % (ref 0.0–3.0)
Eosinophils Absolute: 0 10*3/uL (ref 0.0–0.7)
Eosinophils Relative: 0.3 % (ref 0.0–5.0)
HCT: 29.9 % — ABNORMAL LOW (ref 36.0–46.0)
Hemoglobin: 10.3 g/dL — ABNORMAL LOW (ref 12.0–15.0)
Lymphocytes Relative: 52 % — ABNORMAL HIGH (ref 12.0–46.0)
Lymphs Abs: 1 10*3/uL (ref 0.7–4.0)
MCHC: 34.6 g/dL (ref 30.0–36.0)
MCV: 91.1 fl (ref 78.0–100.0)
Monocytes Absolute: 0.3 10*3/uL (ref 0.1–1.0)
Monocytes Relative: 13.7 % — ABNORMAL HIGH (ref 3.0–12.0)
Neutro Abs: 0.6 10*3/uL — ABNORMAL LOW (ref 1.4–7.7)
Neutrophils Relative %: 33.6 % — ABNORMAL LOW (ref 43.0–77.0)
Platelets: 139 10*3/uL — ABNORMAL LOW (ref 150.0–400.0)
RBC: 3.29 Mil/uL — ABNORMAL LOW (ref 3.87–5.11)
RDW: 13.4 % (ref 11.5–15.5)
WBC: 1.9 10*3/uL — CL (ref 4.0–10.5)

## 2022-02-14 LAB — COMPREHENSIVE METABOLIC PANEL
ALT: 15 U/L (ref 0–35)
AST: 23 U/L (ref 0–37)
Albumin: 4.4 g/dL (ref 3.5–5.2)
Alkaline Phosphatase: 72 U/L (ref 39–117)
BUN: 16 mg/dL (ref 6–23)
CO2: 26 mEq/L (ref 19–32)
Calcium: 9.6 mg/dL (ref 8.4–10.5)
Chloride: 92 mEq/L — ABNORMAL LOW (ref 96–112)
Creatinine, Ser: 1.21 mg/dL — ABNORMAL HIGH (ref 0.40–1.20)
GFR: 46.11 mL/min — ABNORMAL LOW (ref >60.00)
Glucose, Bld: 96 mg/dL (ref 70–99)
Potassium: 3.9 mEq/L (ref 3.5–5.1)
Sodium: 127 mEq/L — ABNORMAL LOW (ref 135–145)
Total Bilirubin: 0.4 mg/dL (ref 0.2–1.2)
Total Protein: 6.9 g/dL (ref 6.0–8.3)

## 2022-02-14 MED ORDER — AMLODIPINE BESYLATE 5 MG PO TABS: 5.0000 mg | ORAL_TABLET | Freq: Every day | ORAL | Status: DC

## 2022-02-14 MED ORDER — BUTALBITAL-APAP-CAFFEINE 50-325-40 MG PO TABS: 1.0000 | ORAL_TABLET | ORAL | 1 refills | Status: DC | PRN

## 2022-02-14 NOTE — Patient Instructions (Signed)
I would stop using your BP cuff.   If you get a new one, get an automatic cuff that goes on the arm (not the wrist).  Go to the lab on the way out.   If you have mychart we'll likely use that to update you.     Let me check with neurology in the meantime.   Don't change your meds for now but try taking a Fioricet prior to going to bed.  See if that helps with headaches.  Take care.  Glad to see you.

## 2022-02-14 NOTE — Telephone Encounter (Signed)
Please call pt.  I checked with neurology about plan/options.    Agreed with plan to use fioricet at night, prior to going to sleep, for early AM HA.  See if that helps and let me know.    Would repeat imaging.  I put in the orders.  Let me know if she can't get scheduled for that.  Would repeat CT head and get MRA given contrast allergy.    Refer to neuro- ordered.  She should get a call about that soon.    Thanks.

## 2022-02-14 NOTE — Telephone Encounter (Signed)
Karen Saunders with [facility] lab called critical lab report for WBC 1.9. sent to Dr [name], [name] pool and will speak with [facility] CMA.results in lab notebook also.

## 2022-02-14 NOTE — Telephone Encounter (Signed)
This is similar to prev/baseline.  Noted.  Thanks.   See result note.

## 2022-02-14 NOTE — Progress Notes (Unsigned)
Her cuff was reading sig higher than our cuff here- her BP was 175/104.  D/w pt about her getting likely artificially high readings at home.    Discussed headaches and fear at night. Removed rugs and using night lights.    She doesn't recall off the events with the fall.  D/w pt.    Concussion path/phys d/w pt.  CT images reviewed with patient. Sister drove patient to visit.    Woke up this AM with HA at ~5AM.  Took zofran for nauea at the time.  Still with nausea episodically.  Waking up with HA every AM, early AM.  Frontal HA, B.  Fioricet helps with HA, if she lays down and is still.  No photophobia.  She is limiting noise exposure.    She is trying to keep her appointment for tomorrow re: cough which is longstanding.    Meds, vitals, and allergies reviewed.   ROS: Per HPI unless specifically indicated in ROS section   CN 2-12 wnl B, S/S wnl x4

## 2022-02-15 ENCOUNTER — Ambulatory Visit: Payer: Medicare Other | Admitting: Neurology

## 2022-02-15 MED ORDER — AMLODIPINE BESYLATE 5 MG PO TABS: 10.0000 mg | ORAL_TABLET | Freq: Every day | ORAL | Status: DC

## 2022-02-15 MED ORDER — DIAZEPAM 2 MG PO TABS: 2.0000 mg | ORAL_TABLET | Freq: Once | ORAL | 0 refills | Status: AC

## 2022-02-15 MED ORDER — HYDROCHLOROTHIAZIDE 25 MG PO TABS: 12.5000 mg | ORAL_TABLET | Freq: Every day | ORAL | Status: DC

## 2022-02-15 NOTE — Telephone Encounter (Signed)
Please call pt about plans/results.   Rx sent for valium to use prior to MRI.  I would get that done first if possible.    Needs a driver, don't drive after taking valium.  Sedation caution.   WBC is low but similar to prev, same with HGB.  Sodium is lower with the extra hydrochlorothiazide.  I would adjust her BP meds:  Would increase amlodipine to 10mg  a day (2 of the 5mg  tabs).  Would cut HCTZ back to 12.5mg  a day.    Please update me about her BP in a few days.  Would like to recheck her labs in about 1 week, this can be done in [place] at [facility] lab closer to patient if needed.    Thanks.

## 2022-02-15 NOTE — Telephone Encounter (Signed)
Patient notified of results.

## 2022-02-15 NOTE — Telephone Encounter (Signed)
Called patient and advised to try fioricet at night, prior to going to sleep to hopefully prevent early morning HA's. Patient already set up for imaging and neurology as stated below. Just needs rx sent in for her nerves.

## 2022-02-15 NOTE — Telephone Encounter (Signed)
Patient is scheduled for Sunday 02/20/22 for her MRA -- CT Head not scheduled yet. Is this okay? Or does she need to have the CT scheduled now as well?  I talked to the patient and she said [facility] did offer her an appt today 02/15/22 but she can't go due to having another appt that she cannot cancel, then they offered her an appt tomorrow 02/16/2022 and she has the Neurologist appt (cannot go) - she set the appt for Sunday 2/11. She is available on Thursday and Friday this week - she is aware that I will follow up with her on Thursday about a sooner appt.   She is requesting that something for nerves be called in to her pharmacy - she is claustrophobic and they advised her to take something prior to her scan.   Please advise, thanks.

## 2022-02-15 NOTE — Telephone Encounter (Signed)
LMTCB

## 2022-02-15 NOTE — Addendum Note (Signed)
Addended by: Tonia Ghent on: 02/15/2022 02:13 PM   Modules accepted: Orders

## 2022-02-16 ENCOUNTER — Encounter: Payer: Self-pay | Admitting: Neurology

## 2022-02-16 ENCOUNTER — Ambulatory Visit
Admission: RE | Admit: 2022-02-16 | Discharge: 2022-02-16 | Disposition: A | Discharge status: Home / Self Care | Payer: Medicare Other | Source: Ambulatory Visit | Attending: Family Medicine | Admitting: Family Medicine

## 2022-02-16 ENCOUNTER — Ambulatory Visit (INDEPENDENT_AMBULATORY_CARE_PROVIDER_SITE_OTHER): Payer: Medicare Other | Admitting: Neurology

## 2022-02-16 VITALS — BP 160/92 | HR 55 | Ht 62.0 in | Wt 116.0 lb

## 2022-02-16 DIAGNOSIS — R519 Headache, unspecified: Secondary | ICD-10-CM

## 2022-02-16 DIAGNOSIS — G44311 Acute post-traumatic headache, intractable: Secondary | ICD-10-CM

## 2022-02-16 DIAGNOSIS — I609 Nontraumatic subarachnoid hemorrhage, unspecified: Secondary | ICD-10-CM

## 2022-02-16 MED ORDER — PREDNISONE 10 MG (21) PO TBPK: ORAL_TABLET | ORAL | 0 refills | Status: DC

## 2022-02-16 NOTE — Patient Instructions (Signed)
Start prednisone taper as instructed  Please send me a MyChart update in one week  Return to clinic in 6 weeks

## 2022-02-16 NOTE — Telephone Encounter (Signed)
Spoke with patient and she had her scans done this morning. Discussed her results from her labs with her and advised her about medication changes. Patient verbalized understanding. Lab appt made for 02/23/22 at 10:05am.

## 2022-02-16 NOTE — Progress Notes (Signed)
Karen Saunders - Initial Visit   Date: 02/16/2022   Karen Saunders MRN: [id number] DOB: 1955-08-13   Dear Dr. [name]:  Thank you for your kind referral of Karen Saunders for consultation of headache. Although her history is well known to you, please allow Karen Saunders to reiterate it for the purpose of our medical record. The patient was accompanied to the clinic by self.    Karen Saunders is a 67 y.o. right-handed female with CAD, hyperlipidemia, hypertension, and trigeminal neuralgia presenting for evaluation of headaches following SAH.   IMPRESSION/PLAN: Secondary headache from traumatic SAH s/p fall.   - Start prednisone taper for pain.  Side effects discussed  - OK to use fioricet qhs prn, however, she is aware this is not a long term treatment plan  - She will update me in 1 week and if she continues to have ongoing pain, I will start gabapentin 100mg  TID.  Review of ENT notes suggest that also were considering this for her chronic cough  Return to clinic in 6 weeks  ------------------------------------------------------------- History of present illness: She was admitted to [facility] from 1/9 - 1/19 for traumatic SAH s/p fall.  CT head shows scattered SAH involving bilateral cerebral hemispheres, most pronounced over the frontal lobes near the vertex.  She was having nausea, vomiting, and headaches.  Since discharge, her nausea and vomiting has improved.  She is able to eat better now.  However, she has been having ongoing severe headaches.  She was given fioricet which helps. Headaches wake her up from sleeping around 3-4a and then she is unable to sleep again, so she is very tired. Headache lasts about 30-min and then eases off as the they progresses.  She takes fioricet for severe pain.  Headaches do not generally recur during the daytime.  PCP recommended taking fioricet at night time to see if it would prevent onset of headache, but she has only  tried this once last night and noticed mild improvement.  Repeat CT scan performed today shows interval resorption of SAH.  MRA head shows generalized tortuosity, otherwise no abnormalities.   Of Saunders, she was seen here by Dr. Wells Guiles Tat in 2016 for trigeminal neuralgia. She remains on carbamazepine 200mg  BID which controls her pain.   She works part-time at [employer].    Out-side paper records, electronic medical record, and images have been reviewed where available and summarized as:  Lab Results  Component Value Date   HGBA1C 5.8 (H) 08/18/2020   Lab Results  Component Value Date   VITAMINB12 255 11/09/2010   Lab Results  Component Value Date   TSH 1.80 10/27/2020   Lab Results  Component Value Date   ESRSEDRATE 18 10/06/2009    Past Medical History:  Diagnosis Date   Anemia    with prev unremarkable heme eval ~2011   CAD (coronary artery disease)    Dr. Wynonia Lawman with Cards   History of gastric ulcer    2005    Hyperlipidemia    Hypertension    IBS (irritable bowel syndrome)    Leukopenia    with prev unremarkable heme eval ~2011   Migraine    Unsp w/o intract w/o status migrainosus   Osteoporosis 11/18/2004   Shingles    Trigeminal neuralgia 03/05/2012    Past Surgical History:  Procedure Laterality Date   ABDOMINAL HYSTERECTOMY  2002   CORONARY ARTERY BYPASS GRAFT  2002   LUMBAR SPINE SURGERY  Stress cardiolite  09/11/2008   Probably normal with breast attenuation noted but no evidence of ischemia   TONSILLECTOMY  ~ 1965     Medications:  Outpatient Encounter Medications as of 02/16/2022  Medication Sig   amLODipine (NORVASC) 5 MG tablet Take 2 tablets (10 mg total) by mouth daily.   butalbital-acetaminophen-caffeine (FIORICET) 50-325-40 MG tablet Take 1 tablet by mouth every 4 (four) hours as needed for headache.   carbamazepine (TEGRETOL) 200 MG tablet TAKE 2 TABLETS IN THE MORNING AND 2 TABLETS AT NIGHT   dexlansoprazole (DEXILANT) 60 MG  capsule Take 60 mg by mouth daily.   docusate sodium (COLACE) 100 MG capsule Take 1 capsule (100 mg total) by mouth 2 (two) times daily.   ezetimibe (ZETIA) 10 MG tablet TAKE 1 TABLET EVERY DAY   famotidine (PEPCID) 20 MG tablet Take 1 tablet (20 mg total) by mouth 2 (two) times daily.   hydrochlorothiazide (HYDRODIURIL) 25 MG tablet Take 0.5 tablets (12.5 mg total) by mouth daily.   levocetirizine (XYZAL) 5 MG tablet TAKE 1 TABLET (5 MG TOTAL) BY MOUTH DAILY.   lisinopril (ZESTRIL) 20 MG tablet Take 1 tablet (20 mg total) by mouth daily.   metoCLOPramide (REGLAN) 5 MG tablet Take 1 tablet (5 mg total) by mouth 3 (three) times daily before meals for 10 days.   metoprolol tartrate (LOPRESSOR) 50 MG tablet Take 0.5 tablets (25 mg total) by mouth 2 (two) times daily.   ondansetron (ZOFRAN) 8 MG tablet Take 1 tablet (8 mg total) by mouth 3 (three) times daily as needed for nausea or vomiting.   predniSONE (STERAPRED UNI-PAK 21 TAB) 10 MG (21) TBPK tablet Take 6 tablets on day 1, then 5 tablets on day 2, and continue to reduce by 1 tablet each day.   promethazine (PHENERGAN) 12.5 MG suppository Place 1 suppository (12.5 mg total) rectally every 6 (six) hours as needed for nausea or vomiting.   rosuvastatin (CRESTOR) 40 MG tablet Take 1 tablet (40 mg total) by mouth daily.   spironolactone (ALDACTONE) 25 MG tablet TAKE 1 TABLET BY MOUTH EVERY DAY   traZODone (DESYREL) 50 MG tablet Take 1 tablet (50 mg total) by mouth at bedtime.   No facility-administered encounter medications on file as of 02/16/2022.    Allergies:  Allergies  Allergen Reactions   Amoxicillin-Pot Clavulanate     REACTION: Rash on tongue   Azithromycin Other (See Comments)    diarrhea   Codeine Nausea Only   Delsym [Dextromethorphan]     GI upset.   Doxycycline     GI intolerance   Hydralazine     Nausea    Hydrochlorothiazide     Hyponatremia at 25mg  dose.    Ibandronate Sodium     REACTION: GI side effects   Iohexol       Desc: HIVES,NASAL CONGESTION DURING A CARDIAC CATH. REQUIRES PRE-MEDS.    Raloxifene     REACTION: GI upset   Sulfonamide Derivatives     REACTION: itching   Zoledronic Acid     REACTION: unable to take this as she was intolerant of pre-tx calcium and vitamin D   Iodinated Contrast Media Rash    Family History: Family History  Problem Relation Age of Onset   Arthritis Mother    Diabetes Mother    Hypertension Mother    Dementia Mother    Stroke Father    Heart disease Father        MI   Diabetes Father  Hypertension Father    Colon cancer Neg Hx    Breast cancer Neg Hx     Social History: Social History   Tobacco Use   Smoking status: Never   Smokeless tobacco: Never  Vaping Use   Vaping Use: Never used  Substance Use Topics   Alcohol use: No    Alcohol/week: 0.0 standard drinks of alcohol   Drug use: No   Social History   Social History Narrative   From [place].   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     Works at [employer]         Right [place] in a two story home    Vital Signs:  BP (!) 160/92   Pulse (!) 55   Ht 5\' 2"  (1.575 m)   Wt 116 lb (52.6 kg)   SpO2 99%   BMI 21.22 kg/m   Neurological Exam: MENTAL STATUS including orientation to time, place, person, recent and remote memory, attention span and concentration, language, and fund of knowledge is normal.  Speech is not dysarthric.  CRANIAL NERVES: II:  No visual field defects.     III-IV-VI: Pupils equal round and reactive to light.  Normal conjugate, extra-ocular eye movements in all directions of gaze.  No nystagmus.  No ptosis.   V:  Normal facial sensation.    VII:  Normal facial symmetry and movements.   VIII:  Normal hearing and vestibular function.   IX-X:  Normal palatal movement.   XI:  Normal shoulder shrug and head rotation.   XII:  Normal tongue  strength and range of motion, no deviation or fasciculation.  MOTOR:  Motor strength is 5/5 throughout.  No atrophy, fasciculations or abnormal movements.  No pronator drift.   MSRs:                                           Right        Left brachioradialis 2+  2+  biceps 2+  2+  triceps 2+  2+  patellar 2+  2+  ankle jerk 2+  2+  Hoffman no  no  plantar response down  down   SENSORY:  Normal and symmetric perception of light touch, pinprick, vibration, and temperature.  Romberg's sign absent.   COORDINATION/GAIT: Normal finger-to- nose-finger.  Intact rapid alternating movements bilaterally.    Gait narrow based and stable.  Thank you for allowing me to participate in patient's care.  If I can answer any additional questions, I would be pleased to do so.    Sincerely,    Juddson Cobern K. Posey Pronto, DO

## 2022-02-16 NOTE — Assessment & Plan Note (Signed)
I asked her not to change her baseline medications for now but try taking a Fioricet prior to going to bed.  She can see if that helps with headaches.  I checked with neurology about getting follow-up imaging set in the meantime.  See orders.  No change in blood pressure medications at this point.  See notes on labs.

## 2022-02-18 ENCOUNTER — Encounter: Payer: Self-pay | Admitting: Family Medicine

## 2022-02-20 ENCOUNTER — Other Ambulatory Visit: Payer: Medicare Other

## 2022-02-21 ENCOUNTER — Encounter: Payer: Self-pay | Admitting: Family Medicine

## 2022-02-23 ENCOUNTER — Encounter: Payer: Self-pay | Admitting: Family Medicine

## 2022-02-23 ENCOUNTER — Other Ambulatory Visit: Payer: Self-pay | Admitting: Family Medicine

## 2022-02-23 ENCOUNTER — Other Ambulatory Visit (INDEPENDENT_AMBULATORY_CARE_PROVIDER_SITE_OTHER): Payer: Medicare Other

## 2022-02-23 DIAGNOSIS — I10 Essential (primary) hypertension: Secondary | ICD-10-CM

## 2022-02-23 LAB — BASIC METABOLIC PANEL
BUN: 22 mg/dL (ref 6–23)
CO2: 31 mEq/L (ref 19–32)
Calcium: 9.4 mg/dL (ref 8.4–10.5)
Chloride: 92 mEq/L — ABNORMAL LOW (ref 96–112)
Creatinine, Ser: 1.48 mg/dL — ABNORMAL HIGH (ref 0.40–1.20)
GFR: 36.2 mL/min — ABNORMAL LOW (ref >60.00)
Glucose, Bld: 78 mg/dL (ref 70–99)
Potassium: 4.6 mEq/L (ref 3.5–5.1)
Sodium: 130 mEq/L — ABNORMAL LOW (ref 135–145)

## 2022-02-24 NOTE — Addendum Note (Signed)
Addended by: Ellamae Sia on: 02/24/2022 07:36 AM   Modules accepted: Orders

## 2022-02-25 ENCOUNTER — Ambulatory Visit: Payer: Medicare Other | Admitting: Physician Assistant

## 2022-03-03 ENCOUNTER — Telehealth: Payer: Self-pay | Admitting: Family Medicine

## 2022-03-03 NOTE — Telephone Encounter (Signed)
Pt called returning Karen Saunders's missed call from yesterday, 2/21. Told pt Duncan's response. Pt had no questions/concerns. Call back # [phone]

## 2022-03-07 ENCOUNTER — Encounter: Payer: Self-pay | Admitting: Family Medicine

## 2022-03-07 NOTE — Telephone Encounter (Signed)
Error

## 2022-03-08 ENCOUNTER — Ambulatory Visit: Payer: Medicare Other | Attending: Physician Assistant | Admitting: Cardiovascular Disease

## 2022-03-08 ENCOUNTER — Encounter: Payer: Self-pay | Admitting: Cardiovascular Disease

## 2022-03-08 ENCOUNTER — Telehealth: Payer: Self-pay | Admitting: Family Medicine

## 2022-03-08 VITALS — BP 162/90 | HR 57 | Ht 62.0 in | Wt 115.6 lb

## 2022-03-08 DIAGNOSIS — Z951 Presence of aortocoronary bypass graft: Secondary | ICD-10-CM | POA: Diagnosis not present

## 2022-03-08 DIAGNOSIS — E782 Mixed hyperlipidemia: Secondary | ICD-10-CM

## 2022-03-08 DIAGNOSIS — I447 Left bundle-branch block, unspecified: Secondary | ICD-10-CM | POA: Diagnosis not present

## 2022-03-08 DIAGNOSIS — I10 Essential (primary) hypertension: Secondary | ICD-10-CM | POA: Diagnosis not present

## 2022-03-08 NOTE — Assessment & Plan Note (Signed)
History of hyperlipidemia on statin therapy lipid profile performed 11/09/2021 revealing total cholesterol 163, LDL 65 HDL 84.

## 2022-03-08 NOTE — Telephone Encounter (Signed)
What she had drawn was what we had ordered. Okay that lab appt was cancelled.

## 2022-03-08 NOTE — Assessment & Plan Note (Signed)
History of essential hypertension with blood pressure measured at 180/104.  She is on amlodipine, metoprolol and spironolactone.  Her last serum creatinine on 02/23/2022 was 1.48 precluding the use of ACE/ARB.  We will recheck a basic metabolic panel today.  I asked her to keep a blood pressure log for 30 days and she will see a Pharm.D. back to review and make appropriate changes.

## 2022-03-08 NOTE — Patient Instructions (Signed)
Medication Instructions:  Your physician recommends that you continue on your current medications as directed. Please refer to the Current Medication list given to you today.  *If you need a refill on your cardiac medications before your next appointment, please call your pharmacy*   Lab Work: Your physician recommends that you have labs drawn today: BMET  If you have labs (blood work) drawn today and your tests are completely normal, you will receive your results only by: [place] (if you have MyChart) OR A paper copy in the mail If you have any lab test that is abnormal or we need to change your treatment, we will call you to review the results.   Follow-Up: At [facility], you and your health needs are our priority.  As part of our continuing mission to provide you with exceptional heart care, we have created designated Provider Care Teams.  These Care Teams include your primary Cardiologist (physician) and Advanced Practice Providers (APPs -  Physician Assistants and Nurse Practitioners) who all work together to provide you with the care you need, when you need it.  We recommend signing up for the patient portal called "MyChart".  Sign up information is provided on this After Visit Summary.  MyChart is used to connect with patients for Virtual Visits (Telemedicine).  Patients are able to view lab/test results, encounter notes, upcoming appointments, etc.  Non-urgent messages can be sent to your provider as well.   To learn more about what you can do with MyChart, go to [email or web address].    Your next appointment:   6 month(s)  Provider:   Fabian Sharp, PA-C, Sande Rives, PA-C, Caron Presume, PA-C, Jory Sims, DNP, ANP, Almyra Deforest, PA-C, or Diona Browner, NP      Then, Quay Burow, MD will plan to see you again in 12 month(s).    Other Instructions Dr. Gwenlyn Found has requested that you schedule an appointment with one of our clinical pharmacists for a  blood pressure check appointment within the next 4 weeks.  If you monitor your blood pressure (BP) at home, please bring your BP cuff and your BP readings with you to this appointment  HOW TO TAKE YOUR BLOOD PRESSURE: Rest 5 minutes before taking your blood pressure. Don't smoke or drink caffeinated beverages for at least 30 minutes before. Take your blood pressure before (not after) you eat. Sit comfortably with your back supported and both feet on the floor (don't cross your legs). Elevate your arm to heart level on a table or a desk. Use the proper sized cuff. It should fit smoothly and snugly around your bare upper arm. There should be enough room to slip a fingertip under the cuff. The bottom edge of the cuff should be 1 inch above the crease of the elbow. Ideally, take 3 measurements at one sitting and record the average.

## 2022-03-08 NOTE — Assessment & Plan Note (Signed)
Chronic. 

## 2022-03-08 NOTE — Progress Notes (Signed)
03/08/2022 Karen Saunders   1955-09-11  [id number]  Primary Physician Tonia Ghent, MD Primary Cardiologist: Lorretta Harp MD Lupe Carney, [place]  HPI:  Karen Saunders is a 67 y.o. thin-appearing divorced African-American female mother of 1 son who is transitioning from Dr. Pernell Dupre to myself after his retirement.  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  Her primary care provider is Dr. Elsie Stain.  Risk factors include treated hypertension and hyperlipidemia.  She is never had a heart attack or stroke.  She had coronary bypass grafting in 2002 and no problems with ischemic heart disease since.  She had a 2D echocardiogram performed 08/24/2021 that was essentially normal with grade 1 diastolic dysfunction.   Current Meds  Medication Sig   amLODipine (NORVASC) 5 MG tablet Take 2 tablets (10 mg total) by mouth daily.   butalbital-acetaminophen-caffeine (FIORICET) 50-325-40 MG tablet Take 1 tablet by mouth every 4 (four) hours as needed for headache.   carbamazepine (TEGRETOL) 200 MG tablet TAKE 2 TABLETS IN THE MORNING AND 2 TABLETS AT NIGHT   dexlansoprazole (DEXILANT) 60 MG capsule Take 60 mg by mouth daily.   ezetimibe (ZETIA) 10 MG tablet TAKE 1 TABLET EVERY DAY   levocetirizine (XYZAL) 5 MG tablet TAKE 1 TABLET (5 MG TOTAL) BY MOUTH DAILY.   metoprolol tartrate (LOPRESSOR) 50 MG tablet Take 0.5 tablets (25 mg total) by mouth 2 (two) times daily.   rosuvastatin (CRESTOR) 40 MG tablet Take 1 tablet (40 mg total) by mouth daily.   spironolactone (ALDACTONE) 25 MG tablet TAKE 1 TABLET BY MOUTH EVERY DAY   traZODone (DESYREL) 50 MG tablet Take 1 tablet (50 mg total) by mouth at bedtime.     Allergies  Allergen Reactions   Gabapentin     Other Reaction(s): Confusion (intolerance), Delusions (intolerance)   Amoxicillin-Pot Clavulanate     REACTION: Rash on tongue   Azithromycin Other (See  Comments)    diarrhea   Codeine Nausea Only   Delsym [Dextromethorphan]     GI upset.   Doxycycline     GI intolerance   Hydralazine     Nausea    Hydrochlorothiazide     Hyponatremia at 12.'5mg'$  dose   Ibandronate Sodium     REACTION: GI side effects   Iohexol      Desc: HIVES,NASAL CONGESTION DURING A CARDIAC CATH. REQUIRES PRE-MEDS.    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     Works at [employer]         Right [place] in a two story home   Social Determinants of Fromberg Strain: Low Risk  (12/08/2021)   Overall Financial Resource Strain (CARDIA)    Difficulty of Paying Living Expenses: Not hard at all  Food Insecurity: No Food Insecurity (12/08/2021)   Hunger Vital Sign    Worried About Running Out of Food in the Last Year: Never true    [place] in the Last Year: Never true  Transportation Needs: No Transportation Needs (12/08/2021)    PRAPARE - Hydrologist (Medical): No    Lack of Transportation (Non-Medical): No  Physical Activity: Not on file  Stress: No Stress Concern Present (12/08/2021)   [place]    Feeling of Stress : Not at all  Social Connections: Unknown (12/08/2021)   Social Connection and Isolation Panel [NHANES]    Frequency of Communication with Friends and Family: More than three times a week    Frequency of Social Gatherings with Friends and Family: More than three times a week    Attends Religious Services: More than 4 times per year    Active Member of [employer] or Organizations: Not on file    Attends Archivist Meetings: More than 4 times per year    Marital Status: Not on file  Intimate Partner Violence: Not At Risk (12/08/2021)   Humiliation, Afraid, Rape, and Kick questionnaire    Fear of Current or Ex-Partner: No    Emotionally Abused: No    Physically Abused: No    Sexually Abused: No     Review of Systems: General: negative for chills, fever, night sweats or weight changes.  Cardiovascular: negative for chest pain, dyspnea on exertion, edema, orthopnea, palpitations, paroxysmal nocturnal dyspnea or shortness of breath Dermatological: negative for rash Respiratory: negative for cough or wheezing Urologic: negative for hematuria Abdominal: negative for nausea, vomiting, diarrhea, bright red blood per rectum, melena, or hematemesis Neurologic: negative for visual changes, syncope, or dizziness All other systems reviewed and are otherwise negative except as noted above.    Blood pressure (!) 180/104, pulse (!) 57, height '5\' 2"'$  (1.575 m), weight 115 lb 9.6 oz (52.4 kg), SpO2 99 %.  General appearance: alert and no distress Neck: no adenopathy, no carotid bruit, no JVD, supple, symmetrical, trachea midline, and thyroid not enlarged, symmetric, no tenderness/mass/nodules Lungs: clear to  auscultation bilaterally Heart: regular rate and rhythm, S1, S2 normal, no murmur, click, rub or gallop Extremities: extremities normal, atraumatic, no cyanosis or edema Pulses: 2+ and symmetric Skin: Skin color, texture, turgor normal. No rashes or lesions Neurologic: Grossly normal  EKG not performed today  ASSESSMENT AND PLAN:   HLD (hyperlipidemia) History of hyperlipidemia on statin therapy lipid profile performed 11/09/2021 revealing total cholesterol 163, LDL 65 HDL 84.  Essential hypertension History of essential hypertension with blood pressure measured at 180/104.  She is on amlodipine, metoprolol and spironolactone.  Her last serum creatinine on 02/23/2022 was 1.48 precluding the use of ACE/ARB.  We will recheck a basic metabolic panel today.  I asked her to keep a blood pressure log for 30 days and she will see a Pharm.D. back to review and make appropriate changes.  Hx of CABG History of coronary artery disease status post CABG back in 2002.  She has not had an issue with ischemic heart disease since.  She denies chest pain or shortness of breath.  Left bundle branch block Chronic     Lorretta Harp MD [facility], [facility] 03/08/2022 9:26 AM

## 2022-03-08 NOTE — Telephone Encounter (Signed)
Patient called to cancel lab work for 03/10/22. She said she had labs drawn while she was at the CVD office today. Were the labs that Dr [name] needed drawn? Or does she need to come back in for different labs?

## 2022-03-08 NOTE — Assessment & Plan Note (Signed)
History of coronary artery disease status post CABG back in 2002.  She has not had an issue with ischemic heart disease since.  She denies chest pain or shortness of breath.

## 2022-03-09 ENCOUNTER — Telehealth: Payer: Self-pay | Admitting: Family Medicine

## 2022-03-09 LAB — BASIC METABOLIC PANEL
BUN/Creatinine Ratio: 11 — ABNORMAL LOW (ref 12–28)
BUN: 11 mg/dL (ref 8–27)
CO2: 24 mmol/L (ref 20–29)
Calcium: 9.8 mg/dL (ref 8.7–10.3)
Chloride: 103 mmol/L (ref 96–106)
Creatinine, Ser: 0.98 mg/dL (ref 0.57–1.00)
Glucose: 82 mg/dL (ref 70–99)
Potassium: 4.9 mmol/L (ref 3.5–5.2)
Sodium: 143 mmol/L (ref 134–144)
eGFR: 63 mL/min/{1.73_m2} (ref >59)

## 2022-03-09 NOTE — Telephone Encounter (Signed)
Please check with ENT clinic at [facility].  Patient has longstanding hx of low WBC.  She is taking carbamazepine.  My understanding is that her low white count predates her carbamazepine use.  There was a concern from ENT about her white count in the midst of carbamazepine use.  I see several options.  She could taper down carbamazepine per ENT instruction and then we could recheck her CBC here.  I would need input on potential carbamazepine taper per ENT. We could refer her back to hematology-she saw them years ago. We could recheck her CBC here with her still on the medication.  I suspect that the options #1 and #2 would be reasonable but I would like their input.  Thanks.

## 2022-03-10 ENCOUNTER — Other Ambulatory Visit: Payer: Medicare Other

## 2022-03-11 ENCOUNTER — Ambulatory Visit: Payer: Medicare Other

## 2022-03-11 NOTE — Telephone Encounter (Signed)
Sent message to Dr. Ileana Roup office and will await response.

## 2022-03-16 ENCOUNTER — Encounter: Payer: Self-pay | Admitting: Family Medicine

## 2022-03-21 ENCOUNTER — Other Ambulatory Visit: Payer: Self-pay | Admitting: *Deleted

## 2022-03-21 MED ORDER — METOPROLOL TARTRATE 50 MG PO TABS: 25.0000 mg | ORAL_TABLET | Freq: Two times a day (BID) | ORAL | 3 refills | Status: DC

## 2022-03-21 NOTE — Telephone Encounter (Signed)
Pt called to check on status of work note? Call [phone] # [phone]

## 2022-03-22 NOTE — Telephone Encounter (Signed)
Please send letter to patient.  Thanks.

## 2022-03-22 NOTE — Telephone Encounter (Signed)
Letter sent.

## 2022-03-23 ENCOUNTER — Telehealth: Payer: Self-pay | Admitting: Family Medicine

## 2022-03-23 NOTE — Telephone Encounter (Signed)
Please see my phone message from 03/09/2022 and please talk to me about this.  Thanks.

## 2022-03-23 NOTE — Telephone Encounter (Signed)
Karen Saunders from [facility] call in to get advice and follow up on this information that was sent over to Dr [name].     "Dr. Elsie Stain please the message below and advise ENT provider regarding patient's use of Tegretol RX:    Tegretol '400mg'$  BID for management of her left tensor tympani myoclonus and tonal tinnitus?

## 2022-03-24 NOTE — Telephone Encounter (Signed)
Called ENT to ask for input about potentially tapering tegretol.  I need ENT input about that.  Will await ENT response. I have not yet contacted patient yet, as I am awaiting ENT's proposed plan.

## 2022-03-25 NOTE — Telephone Encounter (Signed)
Please update patient.  She has a h/o low WBC- this isn't a new issue and she had seen hematology about it years ago.  ENT called to question if carbamazepine was causing her white count to be lower.  I think that her issue predates her carbamazepine use.  However ENT may call her about potentially tapering carbamazepine.  Please update patient as Juluis Rainier, so she will be aware that she may get a call from ENT about this.  I do not think this is an emergent issue.  When I get input from ENT we can see about potentially rechecking her CBC/white count.  Thanks.

## 2022-03-25 NOTE — Telephone Encounter (Signed)
LMTCB

## 2022-03-28 NOTE — Telephone Encounter (Signed)
Pt return call requesting a call back [phone]

## 2022-03-28 NOTE — Telephone Encounter (Signed)
Called and advised patient of the below message from Dr. [name]. Will call back if she has any questions.

## 2022-03-30 ENCOUNTER — Encounter: Payer: Self-pay | Admitting: Neurology

## 2022-03-30 ENCOUNTER — Ambulatory Visit: Payer: Medicare Other | Admitting: Neurology

## 2022-03-30 DIAGNOSIS — Z029 Encounter for administrative examinations, unspecified: Secondary | ICD-10-CM

## 2022-03-31 ENCOUNTER — Encounter: Payer: Self-pay | Admitting: Family Medicine

## 2022-03-31 NOTE — Telephone Encounter (Signed)
Last script I see is for 10mg  daily.

## 2022-04-03 ENCOUNTER — Other Ambulatory Visit: Payer: Self-pay | Admitting: Family Medicine

## 2022-04-03 MED ORDER — AMLODIPINE BESYLATE 10 MG PO TABS: 10.0000 mg | ORAL_TABLET | Freq: Every day | ORAL | Status: DC

## 2022-04-06 ENCOUNTER — Telehealth: Payer: Self-pay | Admitting: Family Medicine

## 2022-04-06 DIAGNOSIS — R7989 Other specified abnormal findings of blood chemistry: Secondary | ICD-10-CM

## 2022-04-06 NOTE — Telephone Encounter (Signed)
PPW placed in your inbox

## 2022-04-06 NOTE — Telephone Encounter (Signed)
Please pull the note for me and put it in my inbox.  Thanks.

## 2022-04-06 NOTE — Telephone Encounter (Signed)
Karen Saunders from [facility] called over and stated that she will be faxing (right fax) over a note regarding tapering of carbamazepine (TEGRETOL) 200 MG tablet from [facility]. Thank you!

## 2022-04-07 ENCOUNTER — Encounter: Payer: Self-pay | Admitting: Family Medicine

## 2022-04-10 NOTE — Addendum Note (Signed)
Addended by: Tonia Ghent on: 04/10/2022 11:38 PM   Modules accepted: Orders

## 2022-04-10 NOTE — Telephone Encounter (Signed)
Please call patient.  She should have gotten a phone call from ENT (or will get one in the near future) about tapering Tegretol.  It would be reasonable to recheck her blood counts about 1 month after she finishes the taper.  I put in the follow-up orders.  Please schedule a lab visit for about 2 months from now.  If she has a persistently abnormal CBC at that point then we can address it.  Thanks.

## 2022-04-11 ENCOUNTER — Ambulatory Visit: Payer: Medicare Other | Attending: Internal Medicine | Admitting: Pharmacist

## 2022-04-11 ENCOUNTER — Encounter: Payer: Self-pay | Admitting: Pharmacist

## 2022-04-11 VITALS — BP 178/89 | HR 54

## 2022-04-11 DIAGNOSIS — I10 Essential (primary) hypertension: Secondary | ICD-10-CM | POA: Diagnosis not present

## 2022-04-11 DIAGNOSIS — H9042 Sensorineural hearing loss, unilateral, left ear, with unrestricted hearing on the contralateral side: Secondary | ICD-10-CM | POA: Insufficient documentation

## 2022-04-11 MED ORDER — HYDROCHLOROTHIAZIDE 12.5 MG PO CAPS: 12.5000 mg | ORAL_CAPSULE | Freq: Every day | ORAL | 0 refills | Status: DC

## 2022-04-11 MED ORDER — HYDROCHLOROTHIAZIDE 25 MG PO TABS: 25.0000 mg | ORAL_TABLET | Freq: Every day | ORAL | 0 refills | Status: DC

## 2022-04-11 NOTE — Progress Notes (Signed)
Patient ID: Karen Saunders                 DOB: 25-Nov-1955                      MRN: [id number]     HPI: Karen Saunders is a 67 y.o. female referred by Dr. Gwenlyn Found to HTN clinic. PMH is significant for CAD, HTN, and hx of CABG.  Takes carbamazepine for tinnitus.  Patient presents today to discuss her uncontrolled HTN. Medications have been changed recently and she is confused. Is not sure if she has amlodipine 5mg  or 10mg  at her house. HCTZ was discontinued but she was not sure why. Does not know why lisinopril was d/c in the past or why telmisartan was as well.  Discharged from [facility] with subarachnoid bleed on 01/28/22.  Lives alone. Eats bacon every morning for breakfast. Eats lunch and dinner out daily. She and her siblings care for their mother with dementia in [place].  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  Denies pain or caffeine use. She admits to not exercising.  Has been checking BP most mornings.    Recent readings:  4/1: 173/80 3/31: 160/93 3/29: 157/97 3/28: 178/97 3/27: 166/106 3/26: 175/99  Patient has not taken medications yet today.  Current HTN meds:  Amlodipine 10 (?) mg daily in the morning Metoprolol tartrate  25mg  BID Spironolctone 25mg  daily in the morning  Previously tried:  Lisinopril Telmisartan/HCTZ   BP goal: <130/80  Home BP readings:   4/1: 173/80 3/31: 160/93 3/29: 157/97 3/28: 178/97 3/27: 166/106 3/26: 175/99  Does not forget to take medications  Wt Readings from Last 3 Encounters:  03/08/22 115 lb 9.6 oz (52.4 kg)  02/16/22 116 lb (52.6 kg)  02/14/22 112 lb (50.8 kg)   BP Readings from Last 3 Encounters:  04/11/22 (!) 178/89  03/08/22 (!) 162/90  02/16/22 (!) 160/92   Pulse Readings from Last 3 Encounters:  04/11/22 (!) 54  03/08/22 (!) 57  02/16/22 (!) 55    Renal function: CrCl cannot be calculated (Patient's most recent lab result is older than the  maximum 21 days allowed.).  Past Medical History:  Diagnosis Date   Anemia    with prev unremarkable heme eval ~2011   CAD (coronary artery disease)    Dr. Wynonia Lawman with Cards   History of gastric ulcer    2005    Hyperlipidemia    Hypertension    IBS (irritable bowel syndrome)    Leukopenia    with prev unremarkable heme eval ~2011   Migraine    Unsp w/o intract w/o status migrainosus   Osteoporosis 11/18/2004   Shingles    Trigeminal neuralgia 03/05/2012    Current Outpatient Medications on File Prior to Visit  Medication Sig Dispense Refill   gabapentin (NEURONTIN) 100 MG capsule Take 100 mg by mouth daily as needed.     amLODipine (NORVASC) 10 MG tablet Take 1 tablet (10 mg total) by mouth daily.     butalbital-acetaminophen-caffeine (FIORICET) 50-325-40 MG tablet Take 1 tablet by mouth every 4 (four) hours as needed for headache. 20 tablet 1   carbamazepine (TEGRETOL) 200 MG tablet TAKE 2 TABLETS IN THE MORNING AND 2 TABLETS AT NIGHT     dexlansoprazole (DEXILANT) 60 MG capsule Take 60 mg by mouth daily.     ezetimibe (ZETIA) 10 MG tablet TAKE 1  TABLET EVERY DAY 90 tablet 3   levocetirizine (XYZAL) 5 MG tablet TAKE 1 TABLET (5 MG TOTAL) BY MOUTH DAILY. 90 tablet 4   metoprolol tartrate (LOPRESSOR) 50 MG tablet Take 0.5 tablets (25 mg total) by mouth 2 (two) times daily. 90 tablet 3   rosuvastatin (CRESTOR) 40 MG tablet Take 1 tablet (40 mg total) by mouth daily. 30 tablet 11   spironolactone (ALDACTONE) 25 MG tablet TAKE 1 TABLET BY MOUTH EVERY DAY 90 tablet 3   traZODone (DESYREL) 50 MG tablet Take 1 tablet (50 mg total) by mouth at bedtime. 90 tablet 1   No current facility-administered medications on file prior to visit.    Allergies  Allergen Reactions   Gabapentin     Other Reaction(s): Confusion (intolerance), Delusions (intolerance)   Amoxicillin-Pot Clavulanate     REACTION: Rash on tongue   Azithromycin Other (See Comments)    diarrhea   Codeine Nausea Only    Delsym [Dextromethorphan]     GI upset.   Doxycycline     GI intolerance   Hydralazine     Nausea    Hydrochlorothiazide     Hyponatremia at 12.5mg  dose   Ibandronate Sodium     REACTION: GI side effects   Iohexol      Desc: HIVES,NASAL CONGESTION DURING A CARDIAC CATH. REQUIRES PRE-MEDS.    Raloxifene     REACTION: GI upset   Sulfonamide Derivatives     REACTION: itching   Zoledronic Acid     REACTION: unable to take this as she was intolerant of pre-tx calcium and vitamin D   Iodinated Contrast Media Rash     Assessment/Plan:  1. Hypertension -   HYPERTENSION CONTROL Vitals:   04/11/22 0946 04/11/22 1000  BP: (!) 176/89 (!) 178/89    The patient's blood pressure is elevated above target today.  In order to address the patient's elevated BP: A new medication was prescribed today.     Patient BP in room 176/89 which is above goal of <130/80 and similar to her home readings. Appears patient should be on amlodipine 10mg . Advised if she has 5mg  tablets at home, she shold take 2 and move administration to the evening. Continue metoprolol BID and keep spironolactone in the morning.   HCTZ was discontinued when Scr increased to 1.48, however had been stable previously while on med. Patient is willing to retry. Will restart at 12.5mg  and recheck BMP in 1-2 weeks. Recheck in clinic in 1 month.  Continue amlodipine 10mg  in evening Continue metoprolol 12.5mg  BID Continue spironolactone 25mg  daily Restart HCTZ 12.5mg  once daily Recheck BMP in 1-2 weeks  Karren Cobble, PharmD, [place], [place], [place], [place], [place], [address] Phone: [phone], Fax: [phone]

## 2022-04-11 NOTE — Patient Instructions (Addendum)
It was nice meeting you today  We would like your blood pressure to be less than 130/80  Please make sure your amlodipine are 10mg  tablets.  If they are 5mg  tablets, you can take 2.  Move these to the evening.  Continue metoprolol 1/2 tablet twice a day and continue spironolactone 25mg  in the morning  Try to watch how much salt you are eating  We will restart hydrochlorothiazide 12.5mg  (1/2 tablet) at this time and check your lab work in a week or 2. I have already placed the orders.   Continue to monitor your blood pressure at home. Try taking it about one to two hours after your morning medication  Karren Cobble, PharmD, [place], [place], [address], [place], [place], [address] Phone: [phone], Fax: [phone]

## 2022-04-15 ENCOUNTER — Other Ambulatory Visit: Payer: Self-pay | Admitting: Family Medicine

## 2022-04-15 DIAGNOSIS — R7989 Other specified abnormal findings of blood chemistry: Secondary | ICD-10-CM

## 2022-04-18 ENCOUNTER — Telehealth: Payer: Self-pay | Admitting: Hematology and Oncology

## 2022-04-18 NOTE — Telephone Encounter (Signed)
scheduled per 4/5 referral, pt has been called and confirmed date and time. Pt is aware of location and to arrive early for check in   

## 2022-04-20 ENCOUNTER — Other Ambulatory Visit: Payer: Self-pay

## 2022-04-20 DIAGNOSIS — I10 Essential (primary) hypertension: Secondary | ICD-10-CM

## 2022-04-21 LAB — BASIC METABOLIC PANEL
BUN/Creatinine Ratio: 17 (ref 12–28)
BUN: 15 mg/dL (ref 8–27)
CO2: 25 mmol/L (ref 20–29)
Calcium: 9.6 mg/dL (ref 8.7–10.3)
Chloride: 99 mmol/L (ref 96–106)
Creatinine, Ser: 0.87 mg/dL (ref 0.57–1.00)
Glucose: 88 mg/dL (ref 70–99)
Potassium: 4.4 mmol/L (ref 3.5–5.2)
Sodium: 139 mmol/L (ref 134–144)
eGFR: 73 mL/min/{1.73_m2} (ref >59)

## 2022-04-22 ENCOUNTER — Other Ambulatory Visit: Payer: Self-pay

## 2022-04-22 DIAGNOSIS — I10 Essential (primary) hypertension: Secondary | ICD-10-CM

## 2022-05-09 ENCOUNTER — Inpatient Hospital Stay: Payer: Medicare Other

## 2022-05-09 ENCOUNTER — Encounter: Payer: Self-pay | Admitting: Hematology and Oncology

## 2022-05-09 ENCOUNTER — Inpatient Hospital Stay: Payer: Medicare Other | Attending: Hematology and Oncology | Admitting: Hematology and Oncology

## 2022-05-09 VITALS — BP 191/92 | HR 59 | Temp 97.8°F | Resp 18 | Ht 62.0 in | Wt 122.0 lb

## 2022-05-09 DIAGNOSIS — D61818 Other pancytopenia: Secondary | ICD-10-CM

## 2022-05-09 DIAGNOSIS — D509 Iron deficiency anemia, unspecified: Secondary | ICD-10-CM | POA: Insufficient documentation

## 2022-05-09 DIAGNOSIS — D72819 Decreased white blood cell count, unspecified: Secondary | ICD-10-CM | POA: Insufficient documentation

## 2022-05-09 DIAGNOSIS — D696 Thrombocytopenia, unspecified: Secondary | ICD-10-CM | POA: Insufficient documentation

## 2022-05-09 DIAGNOSIS — D539 Nutritional anemia, unspecified: Secondary | ICD-10-CM | POA: Insufficient documentation

## 2022-05-09 LAB — CBC WITH DIFFERENTIAL (CANCER CENTER ONLY)
Abs Immature Granulocytes: 0.02 10*3/uL (ref 0.00–0.07)
Basophils Absolute: 0 10*3/uL (ref 0.0–0.1)
Basophils Relative: 1 %
Eosinophils Absolute: 0 10*3/uL (ref 0.0–0.5)
Eosinophils Relative: 1 %
HCT: 30 % — ABNORMAL LOW (ref 36.0–46.0)
Hemoglobin: 10.2 g/dL — ABNORMAL LOW (ref 12.0–15.0)
Immature Granulocytes: 1 %
Lymphocytes Relative: 40 %
Lymphs Abs: 0.8 10*3/uL (ref 0.7–4.0)
MCH: 30.5 pg (ref 26.0–34.0)
MCHC: 34 g/dL (ref 30.0–36.0)
MCV: 89.8 fL (ref 80.0–100.0)
Monocytes Absolute: 0.2 10*3/uL (ref 0.1–1.0)
Monocytes Relative: 12 %
Neutro Abs: 0.9 10*3/uL — ABNORMAL LOW (ref 1.7–7.7)
Neutrophils Relative %: 45 %
Platelet Count: 125 10*3/uL — ABNORMAL LOW (ref 150–400)
RBC: 3.34 MIL/uL — ABNORMAL LOW (ref 3.87–5.11)
RDW: 12.8 % (ref 11.5–15.5)
WBC Count: 1.9 10*3/uL — ABNORMAL LOW (ref 4.0–10.5)
nRBC: 0 % (ref 0.0–0.2)

## 2022-05-09 LAB — SEDIMENTATION RATE: Sed Rate: 22 mm/hr (ref 0–22)

## 2022-05-09 LAB — IRON AND IRON BINDING CAPACITY (CC-WL,HP ONLY)
Iron: 96 ug/dL (ref 28–170)
Saturation Ratios: 19 % (ref 10.4–31.8)
TIBC: 500 ug/dL — ABNORMAL HIGH (ref 250–450)
UIBC: 404 ug/dL (ref 148–442)

## 2022-05-09 LAB — FERRITIN: Ferritin: 18 ng/mL (ref 11–307)

## 2022-05-09 LAB — RETICULOCYTES
Immature Retic Fract: 12 % (ref 2.3–15.9)
RBC.: 3.32 MIL/uL — ABNORMAL LOW (ref 3.87–5.11)
Retic Count, Absolute: 46.5 10*3/uL (ref 19.0–186.0)
Retic Ct Pct: 1.4 % (ref 0.4–3.1)

## 2022-05-09 LAB — VITAMIN B12: Vitamin B-12: 108 pg/mL — ABNORMAL LOW (ref 180–914)

## 2022-05-09 NOTE — Progress Notes (Signed)
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 I will check vitamin B12 level to exclude B12 deficiency  Thrombocytopenia (HCC) This was relatively new over the past 2 years I suspect this is due to side effects of carbamazepine She is not symptomatic She can continue taking carbamazepine as this is helping her  Deficiency anemia Multifactorial, there is a component of iron deficiency anemia in the past Could also have a component of anemia chronic illness I will order iron studies Orders Placed This Encounter  Procedures   CBC with Differential (Cancer Center Only)    Standing Status:   Future    Number of Occurrences:   1    Standing Expiration Date:   05/09/2023   Iron and Iron Binding Capacity ([facility] only)    Standing Status:   Future    Number of Occurrences:   1    Standing Expiration Date:   05/09/2023   Ferritin    Standing Status:   Future    Number of Occurrences:   1    Standing Expiration Date:   05/09/2023   Vitamin B12    Standing Status:   Future    Number of Occurrences:   1    Standing Expiration Date:   05/09/2023   Sedimentation rate    Standing Status:   Future    Number of Occurrences:   1    Standing Expiration Date:   05/09/2023   Reticulocytes    Standing Status:   Future    Number of Occurrences:   1    Standing Expiration Date:   05/09/2023    All questions were answered. The patient knows to call the clinic with any problems, questions or concerns.  The total time  spent in the appointment was 55 minutes encounter with patients including review of chart and various tests results, discussions about plan of care and coordination of care plan  Artis Delay, MD 4/29/202411:54 AM   CHIEF COMPLAINTS/PURPOSE OF CONSULTATION:  Anemia  HISTORY OF PRESENTING ILLNESS:  Karen Saunders 67 y.o. female is here because of pancytopenia  She was found to have abnormal CBC from recent blood count She was referred her to see Dr. Darnelle Catalan in September 2011 for similar reasons I reviewed her electronic records dated back to 2010 She has chronic intermittent leukopenia for several years and had extensive workup in the past.  I suspect the cause of leukopenia was constitutional The patient had intermittent anemia and was noted to have iron deficiency based on abnormal iron studies in the past.  She is not taking any iron supplement Most recently, she was noted to have borderline intermittent thrombocytopenia From leukopenia standpoint, she denies chronic infections From anemia standpoint, she have no symptoms of anemia such as recent chest pain on exertion, shortness of breath on minimal exertion, pre-syncopal episodes, or palpitations. From thrombocytopenia standpoint, she denies any recent bleeding such as epistaxis, hematuria or hematochezia The patient denies over the counter NSAID ingestion. She is not on antiplatelets agents. Her last colonoscopy was on 08/11/21 which showed diverticulosis only She  had no prior history or diagnosis of cancer. Her age appropriate screening programs are up-to-date. She denies any pica and eats a variety of diet. She never donated blood or received blood transfusion  MEDICAL HISTORY:  Past Medical History:  Diagnosis Date   Anemia    with prev unremarkable heme eval ~2011   CAD (coronary artery disease)    Dr. Donnie Aho with Cards   History of gastric ulcer    2005    Hyperlipidemia    Hypertension    IBS (irritable bowel syndrome)     Leukopenia    with prev unremarkable heme eval ~2011   Migraine    Unsp w/o intract w/o status migrainosus   Osteoporosis 11/18/2004   Shingles    Trigeminal neuralgia 03/05/2012    SURGICAL HISTORY: Past Surgical History:  Procedure Laterality Date   ABDOMINAL HYSTERECTOMY  2002   CORONARY ARTERY BYPASS GRAFT  2002   LUMBAR SPINE SURGERY     Stress cardiolite  09/11/2008   Probably normal with breast attenuation noted but no evidence of ischemia   TONSILLECTOMY  ~ 1965    SOCIAL HISTORY: Social History   Socioeconomic History   Marital status: Divorced    Spouse name: Not on file   Number of children: 1   Years of education: Not on file   Highest education level: Not on file  Occupational History   Occupation: MEDICAID SPEC    Employer: [employer]   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     Works at [employer]         Right Handed   Lives in a two story home   Social Determinants of Health   Financial Resource Strain: Low Risk  (12/08/2021)   Overall Financial Resource Strain (CARDIA)    Difficulty of Paying Living Expenses: Not hard at all  Food Insecurity: No Food Insecurity (12/08/2021)   Hunger Vital Sign    Worried About Running Out of Food in the Last Year: Never true    Ran Out of Food in the Last Year: Never true  Transportation Needs: No Transportation Needs (12/08/2021)   PRAPARE - Administrator, Civil Service (Medical): No     Lack of Transportation (Non-Medical): No  Physical Activity: Not on file  Stress: No Stress Concern Present (12/08/2021)   [employer] of Occupational Health - Occupational Stress Questionnaire    Feeling of Stress : Not at all  Social Connections: Unknown (12/08/2021)   Social Connection and Isolation Panel [NHANES]    Frequency of Communication with Friends and Family: More than three times a week    Frequency of Social Gatherings with Friends and Family: More than three times a week    Attends Religious Services: More than 4 times per year    Active Member of [employer] or Organizations: Not on file  Attends Banker Meetings: More than 4 times per year    Marital Status: Not on file  Intimate Partner Violence: Not At Risk (12/08/2021)   Humiliation, Afraid, Rape, and Kick questionnaire    Fear of Current or Ex-Partner: No    Emotionally Abused: No    Physically Abused: No    Sexually Abused: No    FAMILY HISTORY: Family History  Problem Relation Age of Onset   Arthritis Mother    Diabetes Mother    Hypertension Mother    Dementia Mother    Stroke Father    Heart disease Father        MI   Diabetes Father    Hypertension Father    Colon cancer Neg Hx    Breast cancer Neg Hx     ALLERGIES:  is allergic to gabapentin, amoxicillin-pot clavulanate, azithromycin, codeine, delsym [dextromethorphan], doxycycline, hydralazine, hydrochlorothiazide, ibandronate sodium, iohexol, raloxifene, sulfonamide derivatives, zoledronic acid, and iodinated contrast media.  MEDICATIONS:  Current Outpatient Medications  Medication Sig Dispense Refill   amLODipine (NORVASC) 10 MG tablet Take 1 tablet (10 mg total) by mouth daily. (Patient taking differently: Take 2.5 mg by mouth daily. Takes 2.5 mg daily)     butalbital-acetaminophen-caffeine (FIORICET) 50-325-40 MG tablet Take 1 tablet by mouth every 4 (four) hours as needed for headache. 20 tablet 1   carbamazepine (TEGRETOL)  200 MG tablet TAKE 2 TABLETS IN THE MORNING AND 2 TABLETS AT NIGHT     dexlansoprazole (DEXILANT) 60 MG capsule Take 60 mg by mouth daily.     ezetimibe (ZETIA) 10 MG tablet TAKE 1 TABLET EVERY DAY 90 tablet 3   gabapentin (NEURONTIN) 100 MG capsule Take 100 mg by mouth daily as needed.     hydrochlorothiazide (MICROZIDE) 12.5 MG capsule Take 1 capsule (12.5 mg total) by mouth daily. (Patient taking differently: Take 25 mg by mouth daily. Takes 25 mg daily) 30 capsule 0   levocetirizine (XYZAL) 5 MG tablet TAKE 1 TABLET (5 MG TOTAL) BY MOUTH DAILY. 90 tablet 4   metoprolol tartrate (LOPRESSOR) 50 MG tablet Take 0.5 tablets (25 mg total) by mouth 2 (two) times daily. 90 tablet 3   rosuvastatin (CRESTOR) 40 MG tablet Take 1 tablet (40 mg total) by mouth daily. 30 tablet 11   spironolactone (ALDACTONE) 25 MG tablet TAKE 1 TABLET BY MOUTH EVERY DAY 90 tablet 3   traZODone (DESYREL) 50 MG tablet Take 1 tablet (50 mg total) by mouth at bedtime. 90 tablet 1   No current facility-administered medications for this visit.    REVIEW OF SYSTEMS:   Constitutional: Denies fevers, chills or abnormal night sweats Eyes: Denies blurriness of vision, double vision or watery eyes Ears, nose, mouth, throat, and face: Denies mucositis or sore throat Respiratory: Denies cough, dyspnea or wheezes Cardiovascular: Denies palpitation, chest discomfort or lower extremity swelling Gastrointestinal:  Denies nausea, heartburn or change in bowel habits Skin: Denies abnormal skin rashes Lymphatics: Denies new lymphadenopathy or easy bruising Neurological:Denies numbness, tingling or new weaknesses Behavioral/Psych: Mood is stable, no new changes  All other systems were reviewed with the patient and are negative.  PHYSICAL EXAMINATION: ECOG PERFORMANCE STATUS: 0 - Asymptomatic  Vitals:   05/09/22 1057  BP: (!) 191/92  Pulse: (!) 59  Resp: 18  Temp: 97.8 F (36.6 C)  SpO2: 100%   Filed Weights   05/09/22 1057   Weight: 122 lb (55.3 kg)    GENERAL:alert, no distress and comfortable SKIN: skin color, texture,  turgor are normal, no rashes or significant lesions EYES: normal, conjunctiva are pink and non-injected, sclera clear OROPHARYNX:no exudate, no erythema and lips, buccal mucosa, and tongue normal  NECK: supple, thyroid normal size, non-tender, without nodularity LYMPH:  no palpable lymphadenopathy in the cervical, axillary or inguinal LUNGS: clear to auscultation and percussion with normal breathing effort HEART: regular rate & rhythm and no murmurs and no lower extremity edema ABDOMEN:abdomen soft, non-tender and normal bowel sounds Musculoskeletal:no cyanosis of digits and no clubbing  PSYCH: alert & oriented x 3 with fluent speech NEURO: no focal motor/sensory deficits

## 2022-05-09 NOTE — Assessment & Plan Note (Signed)
This was relatively new over the past 2 years I suspect this is due to side effects of carbamazepine She is not symptomatic She can continue taking carbamazepine as this is helping her

## 2022-05-09 NOTE — Assessment & Plan Note (Signed)
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

## 2022-05-09 NOTE — Assessment & Plan Note (Addendum)
Multifactorial, there is a component of iron deficiency anemia in the past Could also have a component of anemia chronic illness  I will order iron studies

## 2022-05-10 ENCOUNTER — Inpatient Hospital Stay (HOSPITAL_BASED_OUTPATIENT_CLINIC_OR_DEPARTMENT_OTHER): Payer: Medicare Other | Admitting: Hematology and Oncology

## 2022-05-10 ENCOUNTER — Encounter: Payer: Self-pay | Admitting: Hematology and Oncology

## 2022-05-10 ENCOUNTER — Ambulatory Visit: Payer: Medicare Other | Admitting: Hematology and Oncology

## 2022-05-10 DIAGNOSIS — D72819 Decreased white blood cell count, unspecified: Secondary | ICD-10-CM | POA: Diagnosis not present

## 2022-05-10 DIAGNOSIS — E538 Deficiency of other specified B group vitamins: Secondary | ICD-10-CM | POA: Insufficient documentation

## 2022-05-10 DIAGNOSIS — D509 Iron deficiency anemia, unspecified: Secondary | ICD-10-CM | POA: Insufficient documentation

## 2022-05-10 DIAGNOSIS — D508 Other iron deficiency anemias: Secondary | ICD-10-CM | POA: Diagnosis not present

## 2022-05-10 NOTE — Assessment & Plan Note (Signed)
This could be constitutional versus due to vitamin B12 deficiency As above, we will get her vitamin B12 replacement therapy and recheck her labs in a few months

## 2022-05-10 NOTE — Assessment & Plan Note (Signed)
The most likely cause of her anemia is due to chronic blood loss/malabsorption syndrome. We discussed some of the risks, benefits, and alternatives of intravenous iron infusions. The patient is symptomatic from anemia and the iron level is critically low. She tolerated oral iron supplement poorly and desires to achieved higher levels of iron faster for adequate hematopoesis. Some of the side-effects to be expected including risks of infusion reactions, phlebitis, headaches, nausea and fatigue.  The patient is willing to proceed. Patient education material was dispensed.  Goal is to keep ferritin level greater than 50 and resolution of anemia  

## 2022-05-10 NOTE — Progress Notes (Signed)
HEMATOLOGY-ONCOLOGY ELECTRONIC VISIT PROGRESS NOTE  Patient Care Team: Joaquim Nam, MD as PCP - General Charna Elizabeth, MD as Consulting Physician (Gastroenterology) Othella Boyer, MD as Consulting Physician (Cardiology) Glendale Chard, DO as Consulting Physician (Neurology) Runell Gess, MD as Consulting Physician (Cardiology) Barnie Alderman, MD as Referring Physician (Otolaryngology)  I connected with the patient via telephone conference and verified that I am speaking with the correct person using two identifiers. The patient's location is at home and I am providing care from the [facility] I discussed the limitations, risks, security and privacy concerns of performing an evaluation and management service by e-visits and the availability of in person appointments.  I also discussed with the patient that there may be a patient responsible charge related to this service. The patient expressed understanding and agreed to proceed.   ASSESSMENT & PLAN:  Vitamin B12 deficiency As part of her workup for pancytopenia, she was found to have low vitamin B12 I recommend vitamin B12 injection We will schedule B12 injection with her IV iron initially weekly and then monthly I will see her back in 3 months for further follow-up  Chronic leukopenia This could be constitutional versus due to vitamin B12 deficiency As above, we will get her vitamin B12 replacement therapy and recheck her labs in a few months  Iron deficiency anemia The most likely cause of her anemia is due to chronic blood loss/malabsorption syndrome. We discussed some of the risks, benefits, and alternatives of intravenous iron infusions. The patient is symptomatic from anemia and the iron level is critically low. She tolerated oral iron supplement poorly and desires to achieved higher levels of iron faster for adequate hematopoesis. Some of the side-effects to be expected including risks of infusion  reactions, phlebitis, headaches, nausea and fatigue.  The patient is willing to proceed. Patient education material was dispensed.  Goal is to keep ferritin level greater than 50 and resolution of anemia   Orders Placed This Encounter  Procedures   Iron and Iron Binding Capacity ([facility] only)    Standing Status:   Future    Standing Expiration Date:   05/10/2023   Ferritin    Standing Status:   Future    Standing Expiration Date:   05/10/2023   Vitamin B12    Standing Status:   Future    Standing Expiration Date:   05/10/2023   CBC with Differential (Cancer Center Only)    Standing Status:   Future    Standing Expiration Date:   05/10/2023    INTERVAL HISTORY: Please see below for problem oriented charting. The purpose of today's discussion is to review test results Discussed recent test results of her blood count, iron studies and B12 studies We discussed treatment options  HISTORY OF PRESENTING ILLNESS:  Karen Saunders 67 y.o. female is here because of pancytopenia  She was found to have abnormal CBC from recent blood count She was referred her to see Dr. Darnelle Catalan in September 2011 for similar reasons I reviewed her electronic records dated back to 2010 She has chronic intermittent leukopenia for several years and had extensive workup in the past.  I suspect the cause of leukopenia was constitutional The patient had intermittent anemia and was noted to have iron deficiency based on abnormal iron studies in the past.  She is not taking any iron supplement Most recently, she was noted to have borderline intermittent thrombocytopenia From leukopenia standpoint, she denies chronic infections From anemia standpoint,  she have no symptoms of anemia such as recent chest pain on exertion, shortness of breath on minimal exertion, pre-syncopal episodes, or palpitations. From thrombocytopenia standpoint, she denies any recent bleeding such as epistaxis, hematuria or hematochezia The patient  denies over the counter NSAID ingestion. She is not on antiplatelets agents. Her last colonoscopy was on 08/11/21 which showed diverticulosis only She had no prior history or diagnosis of cancer. Her age appropriate screening programs are up-to-date. She denies any pica and eats a variety of diet. She never donated blood or received blood transfusion  REVIEW OF SYSTEMS:   Constitutional: Denies fevers, chills or abnormal weight loss Eyes: Denies blurriness of vision Ears, nose, mouth, throat, and face: Denies mucositis or sore throat Respiratory: Denies cough, dyspnea or wheezes Cardiovascular: Denies palpitation, chest discomfort Gastrointestinal:  Denies nausea, heartburn or change in bowel habits Skin: Denies abnormal skin rashes Lymphatics: Denies new lymphadenopathy or easy bruising Neurological:Denies numbness, tingling or new weaknesses Behavioral/Psych: Mood is stable, no new changes  Extremities: No lower extremity edema All other systems were reviewed with the patient and are negative.  I have reviewed the past medical history, past surgical history, social history and family history with the patient and they are unchanged from previous note.  ALLERGIES:  is allergic to gabapentin, amoxicillin-pot clavulanate, azithromycin, codeine, delsym [dextromethorphan], doxycycline, hydralazine, hydrochlorothiazide, ibandronate sodium, iohexol, raloxifene, sulfonamide derivatives, zoledronic acid, and iodinated contrast media.  MEDICATIONS:  Current Outpatient Medications  Medication Sig Dispense Refill   amLODipine (NORVASC) 10 MG tablet Take 1 tablet (10 mg total) by mouth daily. (Patient taking differently: Take 2.5 mg by mouth daily. Takes 2.5 mg daily)     butalbital-acetaminophen-caffeine (FIORICET) 50-325-40 MG tablet Take 1 tablet by mouth every 4 (four) hours as needed for headache. 20 tablet 1   carbamazepine (TEGRETOL) 200 MG tablet TAKE 2 TABLETS IN THE MORNING AND 2 TABLETS AT  NIGHT     dexlansoprazole (DEXILANT) 60 MG capsule Take 60 mg by mouth daily.     ezetimibe (ZETIA) 10 MG tablet TAKE 1 TABLET EVERY DAY 90 tablet 3   gabapentin (NEURONTIN) 100 MG capsule Take 100 mg by mouth daily as needed.     hydrochlorothiazide (MICROZIDE) 12.5 MG capsule Take 1 capsule (12.5 mg total) by mouth daily. (Patient taking differently: Take 25 mg by mouth daily. Takes 25 mg daily) 30 capsule 0   levocetirizine (XYZAL) 5 MG tablet TAKE 1 TABLET (5 MG TOTAL) BY MOUTH DAILY. 90 tablet 4   metoprolol tartrate (LOPRESSOR) 50 MG tablet Take 0.5 tablets (25 mg total) by mouth 2 (two) times daily. 90 tablet 3   rosuvastatin (CRESTOR) 40 MG tablet Take 1 tablet (40 mg total) by mouth daily. 30 tablet 11   spironolactone (ALDACTONE) 25 MG tablet TAKE 1 TABLET BY MOUTH EVERY DAY 90 tablet 3   traZODone (DESYREL) 50 MG tablet Take 1 tablet (50 mg total) by mouth at bedtime. 90 tablet 1   No current facility-administered medications for this visit.    PHYSICAL EXAMINATION: ECOG PERFORMANCE STATUS: 0 - Asymptomatic  LABORATORY DATA:  I have reviewed the data as listed    Latest Ref Rng & Units 04/20/2022    2:07 PM 03/08/2022    9:34 AM 02/23/2022    9:50 AM  CMP  Glucose 70 - 99 mg/dL 88  82  78   BUN 8 - 27 mg/dL 15  11  22    Creatinine 0.57 - 1.00 mg/dL 1.61  0.96  0.45  Sodium 134 - 144 mmol/L 139  143  130   Potassium 3.5 - 5.2 mmol/L 4.4  4.9  4.6   Chloride 96 - 106 mmol/L 99  103  92   CO2 20 - 29 mmol/L 25  24  31    Calcium 8.7 - 10.3 mg/dL 9.6  9.8  9.4     Lab Results  Component Value Date   WBC 1.9 (L) 05/09/2022   HGB 10.2 (L) 05/09/2022   HCT 30.0 (L) 05/09/2022   MCV 89.8 05/09/2022   PLT 125 (L) 05/09/2022   NEUTROABS 0.9 (L) 05/09/2022     I discussed the assessment and treatment plan with the patient. The patient was provided an opportunity to ask questions and all were answered. The patient agreed with the plan and demonstrated an understanding of the  instructions. The patient was advised to call back or seek an in-person evaluation if the symptoms worsen or if the condition fails to improve as anticipated.    I spent 20 minutes for the appointment reviewing test results, discuss management and coordination of care.  Artis Delay, MD 05/10/2022 2:37 PM

## 2022-05-10 NOTE — Assessment & Plan Note (Signed)
As part of her workup for pancytopenia, she was found to have low vitamin B12 I recommend vitamin B12 injection We will schedule B12 injection with her IV iron initially weekly and then monthly I will see her back in 3 months for further follow-up

## 2022-05-12 ENCOUNTER — Telehealth: Payer: Self-pay | Admitting: Hematology and Oncology

## 2022-05-12 NOTE — Telephone Encounter (Signed)
Spoke with patient confirming upcoming appointment  

## 2022-05-20 ENCOUNTER — Encounter: Payer: Self-pay | Admitting: Pharmacist

## 2022-05-20 ENCOUNTER — Ambulatory Visit: Payer: Medicare Other | Attending: Cardiovascular Disease | Admitting: Pharmacist

## 2022-05-20 VITALS — BP 183/92 | HR 48

## 2022-05-20 DIAGNOSIS — I10 Essential (primary) hypertension: Secondary | ICD-10-CM

## 2022-05-20 MED ORDER — AMLODIPINE BESYLATE 10 MG PO TABS
10.0000 mg | ORAL_TABLET | Freq: Every day | ORAL | 1 refills | Status: DC
Start: 2022-05-20 — End: 2022-06-24

## 2022-05-20 MED ORDER — HYDROCHLOROTHIAZIDE 25 MG PO TABS
25.0000 mg | ORAL_TABLET | Freq: Every day | ORAL | 3 refills | Status: DC
Start: 2022-05-20 — End: 2022-09-02

## 2022-05-20 NOTE — Patient Instructions (Signed)
We would like your blood pressure to be less than 130/80  Since we are confused about the medication strengths, please give me a call when you get home and lets talk about which medications you are taking  Laural Golden, PharmD, BCACP, CDCES, CPP [address], [address] [place], [place], [address] Phone: [phone], Fax: [phone]

## 2022-05-20 NOTE — Progress Notes (Signed)
Patient ID: Karen Saunders                 DOB: 1955-12-28                      MRN: [id number]     HPI: Karen Saunders is a 67 y.o. female referred by Dr. Allyson Sabal to HTN clinic. PMH is significant for CAD, HTN, and hx of CABG.  Takes carbamazepine for tinnitus.  Patient presents today for follow up to discuss her uncontrolled HTN. At last visit she was confused by her medication regimen. Unfortunately is still confused.   Medication list has amlodipine 2.5mg  when patient should be on 10mg . At last visit HCTZ 12.5mg  was added, however patient believes she takes 1/2 tablet.  Continues to have stress from caring for her mother with dementia. Is trying to eat more at home now rather than the majority of her meals at [employer].   Has been checking BP most mornings.    Recent readings:  5/10: 177/97 5/9: 183/99 5/6: 148/92 5/5: 180/109 5/3: 177/98   Current HTN meds:  Amlodipine 10 (?) mg daily in the morning Metoprolol tartrate 12.5mg  BID Spironolctone 25mg  daily in the morning HCTZ 12.5?  Previously tried:  Lisinopril Telmisartan/HCTZ   BP goal: <130/80  Home BP readings:   5/10: 177/97 5/9: 183/99 5/6: 148/92 5/5: 180/109 5/3: 177/98   Does not forget to take medications  Wt Readings from Last 3 Encounters:  03/08/22 115 lb 9.6 oz (52.4 kg)  02/16/22 116 lb (52.6 kg)  02/14/22 112 lb (50.8 kg)   BP Readings from Last 3 Encounters:  04/11/22 (!) 178/89  03/08/22 (!) 162/90  02/16/22 (!) 160/92   Pulse Readings from Last 3 Encounters:  04/11/22 (!) 54  03/08/22 (!) 57  02/16/22 (!) 55    Renal function: CrCl cannot be calculated (Patient's most recent lab result is older than the maximum 21 days allowed.).  Past Medical History:  Diagnosis Date   Anemia    with prev unremarkable heme eval ~2011   CAD (coronary artery disease)    Dr. Donnie Aho with Cards   History of gastric ulcer    2005    Hyperlipidemia    Hypertension    IBS (irritable bowel  syndrome)    Leukopenia    with prev unremarkable heme eval ~2011   Migraine    Unsp w/o intract w/o status migrainosus   Osteoporosis 11/18/2004   Shingles    Trigeminal neuralgia 03/05/2012    Current Outpatient Medications on File Prior to Visit  Medication Sig Dispense Refill   gabapentin (NEURONTIN) 100 MG capsule Take 100 mg by mouth daily as needed.     amLODipine (NORVASC) 10 MG tablet Take 1 tablet (10 mg total) by mouth daily.     butalbital-acetaminophen-caffeine (FIORICET) 50-325-40 MG tablet Take 1 tablet by mouth every 4 (four) hours as needed for headache. 20 tablet 1   carbamazepine (TEGRETOL) 200 MG tablet TAKE 2 TABLETS IN THE MORNING AND 2 TABLETS AT NIGHT     dexlansoprazole (DEXILANT) 60 MG capsule Take 60 mg by mouth daily.     ezetimibe (ZETIA) 10 MG tablet TAKE 1 TABLET EVERY DAY 90 tablet 3   levocetirizine (XYZAL) 5 MG tablet TAKE 1 TABLET (5 MG TOTAL) BY MOUTH DAILY. 90 tablet 4   metoprolol tartrate (LOPRESSOR) 50 MG tablet Take 0.5 tablets (25 mg total) by mouth 2 (two) times daily. 90 tablet 3  rosuvastatin (CRESTOR) 40 MG tablet Take 1 tablet (40 mg total) by mouth daily. 30 tablet 11   spironolactone (ALDACTONE) 25 MG tablet TAKE 1 TABLET BY MOUTH EVERY DAY 90 tablet 3   traZODone (DESYREL) 50 MG tablet Take 1 tablet (50 mg total) by mouth at bedtime. 90 tablet 1   No current facility-administered medications on file prior to visit.    Allergies  Allergen Reactions   Gabapentin     Other Reaction(s): Confusion (intolerance), Delusions (intolerance)   Amoxicillin-Pot Clavulanate     REACTION: Rash on tongue   Azithromycin Other (See Comments)    diarrhea   Codeine Nausea Only   Delsym [Dextromethorphan]     GI upset.   Doxycycline     GI intolerance   Hydralazine     Nausea    Hydrochlorothiazide     Hyponatremia at 12.5mg  dose   Ibandronate Sodium     REACTION: GI side effects   Iohexol      Desc: HIVES,NASAL CONGESTION DURING A CARDIAC  CATH. REQUIRES PRE-MEDS.    Raloxifene     REACTION: GI upset   Sulfonamide Derivatives     REACTION: itching   Zoledronic Acid     REACTION: unable to take this as she was intolerant of pre-tx calcium and vitamin D   Iodinated Contrast Media Rash     Assessment/Plan:  1. Hypertension -  HYPERTENSION CONTROL Vitals:   05/20/22 1039 05/20/22 1041  BP: (!) 176/86 (!) 183/92    The patient's blood pressure is elevated above target today.  In order to address the patient's elevated BP: A current anti-hypertensive medication was adjusted today.    Patient BP remains above goal of <130/80. Had patient go home and read off medication bottles and strengths. She has amlodipine 5mg , HCTZ 25mg , spironolactone 25mg , and metoprolol 50mg . Advised to increase amlodipine to 10mg  once daily and will follow up in one month. Will update med list.  Laural Golden, PharmD, BCACP, CDCES, CPP [address], [address] [place], [place], [address] Phone: [phone], Fax: [phone]    Laural Golden, PharmD, BCACP, CDCES, CPP [address], [address] [place], [place], [address] Phone: [phone], Fax: [phone]

## 2022-05-24 ENCOUNTER — Inpatient Hospital Stay: Payer: Medicare Other | Attending: Hematology and Oncology

## 2022-05-24 ENCOUNTER — Inpatient Hospital Stay (HOSPITAL_BASED_OUTPATIENT_CLINIC_OR_DEPARTMENT_OTHER): Payer: Medicare Other | Admitting: Physician Assistant

## 2022-05-24 VITALS — BP 190/88 | HR 50 | Temp 98.1°F | Resp 16 | Wt 122.8 lb

## 2022-05-24 DIAGNOSIS — D696 Thrombocytopenia, unspecified: Secondary | ICD-10-CM | POA: Insufficient documentation

## 2022-05-24 DIAGNOSIS — D539 Nutritional anemia, unspecified: Secondary | ICD-10-CM

## 2022-05-24 DIAGNOSIS — D509 Iron deficiency anemia, unspecified: Secondary | ICD-10-CM | POA: Diagnosis present

## 2022-05-24 DIAGNOSIS — D72819 Decreased white blood cell count, unspecified: Secondary | ICD-10-CM | POA: Insufficient documentation

## 2022-05-24 DIAGNOSIS — T8090XA Unspecified complication following infusion and therapeutic injection, initial encounter: Secondary | ICD-10-CM | POA: Diagnosis not present

## 2022-05-24 MED ORDER — CYANOCOBALAMIN 1000 MCG/ML IJ SOLN
1000.0000 ug | Freq: Once | INTRAMUSCULAR | Status: AC
  Administered 2022-05-24: 1000 ug via INTRAMUSCULAR
  Filled 2022-05-24: qty 1

## 2022-05-24 MED ORDER — SODIUM CHLORIDE 0.9 % IV SOLN
400.0000 mg | Freq: Once | INTRAVENOUS | Status: AC
  Administered 2022-05-24: 400 mg via INTRAVENOUS
  Filled 2022-05-24: qty 20

## 2022-05-24 MED ORDER — SODIUM CHLORIDE 0.9 % IV SOLN: Freq: Once | INTRAVENOUS | Status: AC

## 2022-05-24 MED ORDER — FAMOTIDINE 20 MG IN NS 100 ML IVPB
20.0000 mg | Freq: Once | INTRAVENOUS | Status: AC
  Administered 2022-05-24: 20 mg via INTRAVENOUS

## 2022-05-24 MED ORDER — DIPHENHYDRAMINE HCL 50 MG/ML IJ SOLN
12.5000 mg | Freq: Once | INTRAMUSCULAR | Status: AC
  Administered 2022-05-24: 12.5 mg via INTRAVENOUS

## 2022-05-24 MED ORDER — METHYLPREDNISOLONE SODIUM SUCC 125 MG IJ SOLR
125.0000 mg | Freq: Once | INTRAMUSCULAR | Status: AC
  Administered 2022-05-24: 125 mg via INTRAVENOUS

## 2022-05-24 NOTE — Progress Notes (Signed)
Hypersensitivity Reaction note  Date of event: 05/24/22 Time of event: 0946 Generic name of drug involved: Iron Sucrose (Venofer) Name of provider notified of the hypersensitivity reaction: Daphane Shepherd PA-C and Artis Delay, MD Was agent that likely caused hypersensitivity reaction added to Allergies List within EMR? Yes  0945 Pt c/o PIV site discomfort. This RN paused Venofer and NS infusions and assessed PIV site for patency. PIV site was slow flushed and blood return was noted. Pt stated, "it feels better now, I think my arm was cold". While this RN was assessing Pt's PIV site, Pt c/o chest tightness, dizziness and "feeling funny". Pt denied feeling SOB, or back pain. Upon visual assessment Pt appeared to be diaphoretic. Pt denied feeling sweaty or warm. 1610 Benedict Needy was notified and 1 L of IVFs hung to gravity.  9604 Benedict Needy chairside assessing Pt. Pepcid 20 mg IV administered per V/O from [facility]. VS obtained: BP 210/102, HR 51, RR 20, O2 100.  0950 VS obtained: BP 223/104, HR 52, O2 100%, RR 20. 0951 Pepcid administration completed.  5409 Solu-Medrol 125 mg IV administered per V/O from [facility].  [phone] Benadryl 12.5 mg administered per V/O from [facility].  [phone] Per Dr.Gorsuch discontinue Venofer infusion. VS obtained: BP 214/102, HR 52, O2 99%, RR 18.  0958 Pt stated "My chest feels lighter, that tight feeling is gone".  Pt remained A&Ox4 throughout rxn.  1005 VS obtained: BP 212/98, HR 52, O2 100%, RR 16.  Per Jae Dire PA-C Pt to be observed for 30 minutes post symptom resolution. Per Benedict Needy OK to d/c Pt once BP is closer to baseline. 1035 VS obtained: BP 190/88, HR 50, O2 100%, RR 16. Jae Dire PA-C made aware. 1045 Pt ambulated to lobby upon discharge without complaint. Pt was provided education on her rxn and on what she received today. Pt verbalized understanding. Pt's VS back to the Pt's baseline. Pt expressed gratitude for the quick work to take care of her today.   See MAR and  VS flowsheet respectively.   March Rummage, RN 05/24/2022 11:58 AM

## 2022-05-24 NOTE — Patient Instructions (Signed)

## 2022-05-24 NOTE — Progress Notes (Signed)
DATE:  05/24/22                                            x INSFUSION REACTION     MD: Bertis Ruddy   AGENT/BLOOD PRODUCT RECEIVING TODAY:              Venofer 400   AGENT/BLOOD PRODUCT RECEIVING IMMEDIATELY PRIOR TO REACTION:          Venofer 400   VS: BP:     214/102   P:       52       SPO2:       99% RA                BP:     190/88   P:       56       SPO2:       99% RA     REACTION(S):           chest pressure, dizziness, diaphoresis   PREMEDS:     none   INTERVENTION: Pepcid 20 mg IV, solu-medrol 125 mg IV, benadryl 12.5 mg   Review of Systems  Review of Systems  Constitutional:  Positive for diaphoresis.  Cardiovascular:  Positive for chest pain (pressure).  Neurological:  Positive for dizziness.  All other systems reviewed and are negative.    Physical Exam  Physical Exam Vitals and nursing note reviewed.  Constitutional:      General: She is not in acute distress.    Appearance: She is diaphoretic. She is not toxic-appearing.  HENT:     Head: Normocephalic.  Eyes:     Conjunctiva/sclera: Conjunctivae normal.  Cardiovascular:     Rate and Rhythm: Normal rate and regular rhythm.     Pulses: Normal pulses.     Heart sounds: Normal heart sounds.  Pulmonary:     Effort: Pulmonary effort is normal.     Breath sounds: Normal breath sounds.  Abdominal:     General: There is no distension.  Musculoskeletal:     Cervical back: Normal range of motion.  Skin:    General: Skin is warm.  Neurological:     Mental Status: She is alert.  Psychiatric:        Mood and Affect: Mood is anxious.     OUTCOME:                Patient became symptomatic approximately 30 minutes into iron infusion, first time venofer 400.  Emergency medications were given as above.  Patient was noted to be hypertensive.  She was anxious appearing at this time.  She has a history of hypertension and took her medications this morning, also admits to whitecoat hypertension.  Iron was  discontinued based on severity of reaction.  Patient received 500 mL normal saline and monitored closely.  Blood pressure rechecked and is similar to her baseline.  Patient received her B12 injection.  Oncologist made aware of reaction and will make changes to her treatment plan. Patient stable for discharge.  I have spent a total of 20 minutes minutes of face-to-face and non-face-to-face time, preparing to see the patient, obtaining and/or reviewing separately obtained history, performing a medically appropriate examination, counseling and educating the patient, ordering medications, referring and communicating with other health care professionals, documenting clinical information in the electronic health record.

## 2022-05-25 ENCOUNTER — Encounter: Payer: Self-pay | Admitting: *Deleted

## 2022-05-25 ENCOUNTER — Other Ambulatory Visit: Payer: Self-pay | Admitting: *Deleted

## 2022-05-25 ENCOUNTER — Other Ambulatory Visit: Payer: Self-pay | Admitting: Hematology and Oncology

## 2022-05-27 ENCOUNTER — Inpatient Hospital Stay: Payer: Medicare Other

## 2022-06-03 ENCOUNTER — Inpatient Hospital Stay: Payer: Medicare Other

## 2022-06-03 VITALS — BP 164/85 | HR 52 | Temp 98.2°F | Resp 17

## 2022-06-03 DIAGNOSIS — D539 Nutritional anemia, unspecified: Secondary | ICD-10-CM

## 2022-06-03 DIAGNOSIS — D509 Iron deficiency anemia, unspecified: Secondary | ICD-10-CM | POA: Diagnosis not present

## 2022-06-03 MED ORDER — FAMOTIDINE 20 MG IN NS 100 ML IVPB
20.0000 mg | Freq: Once | INTRAVENOUS | Status: AC
  Administered 2022-06-03: 20 mg via INTRAVENOUS
  Filled 2022-06-03: qty 100

## 2022-06-03 MED ORDER — SODIUM CHLORIDE 0.9 % IV SOLN: Freq: Once | INTRAVENOUS | Status: AC

## 2022-06-03 MED ORDER — SODIUM CHLORIDE 0.9 % IV SOLN
510.0000 mg | Freq: Once | INTRAVENOUS | Status: AC
  Administered 2022-06-03: 510 mg via INTRAVENOUS
  Filled 2022-06-03: qty 510

## 2022-06-03 MED ORDER — CETIRIZINE HCL 10 MG/ML IV SOLN
10.0000 mg | Freq: Once | INTRAVENOUS | Status: AC
  Administered 2022-06-03: 10 mg via INTRAVENOUS
  Filled 2022-06-03: qty 1

## 2022-06-03 MED ORDER — METHYLPREDNISOLONE SODIUM SUCC 40 MG IJ SOLR
40.0000 mg | Freq: Once | INTRAMUSCULAR | Status: AC
  Administered 2022-06-03: 40 mg via INTRAVENOUS
  Filled 2022-06-03: qty 1

## 2022-06-03 MED ORDER — LORATADINE 10 MG PO TABS
10.0000 mg | ORAL_TABLET | Freq: Once | ORAL | Status: AC
  Administered 2022-06-03: 10 mg via ORAL
  Filled 2022-06-03: qty 1

## 2022-06-03 NOTE — Patient Instructions (Signed)

## 2022-06-17 ENCOUNTER — Other Ambulatory Visit: Payer: Self-pay | Admitting: Family Medicine

## 2022-06-24 ENCOUNTER — Telehealth: Payer: Self-pay | Admitting: Pharmacist

## 2022-06-24 DIAGNOSIS — I10 Essential (primary) hypertension: Secondary | ICD-10-CM

## 2022-06-24 MED ORDER — AMLODIPINE BESYLATE 10 MG PO TABS
10.0000 mg | ORAL_TABLET | Freq: Every day | ORAL | 1 refills | Status: DC
Start: 2022-06-24 — End: 2022-06-28

## 2022-06-24 NOTE — Telephone Encounter (Signed)
Patient called and left VM. Has been taking 2 of her amlodipine 5mg  tablets and is running low. Will send in refill of 10mg  tablets. Attempted to call patient. No answer. LMOM.

## 2022-06-28 ENCOUNTER — Ambulatory Visit: Payer: Medicare Other | Attending: Cardiovascular Disease | Admitting: Pharmacist

## 2022-06-28 ENCOUNTER — Encounter: Payer: Self-pay | Admitting: Pharmacist

## 2022-06-28 VITALS — BP 163/86 | HR 51

## 2022-06-28 DIAGNOSIS — I10 Essential (primary) hypertension: Secondary | ICD-10-CM | POA: Diagnosis not present

## 2022-06-28 DIAGNOSIS — I1A0 Resistant hypertension: Secondary | ICD-10-CM | POA: Diagnosis not present

## 2022-06-28 MED ORDER — AMLODIPINE BESYLATE 10 MG PO TABS
10.0000 mg | ORAL_TABLET | Freq: Every day | ORAL | 1 refills | Status: DC
Start: 2022-06-28 — End: 2022-09-02

## 2022-06-28 NOTE — Patient Instructions (Addendum)
It was good seeing you again  We would like your blood pressure to be less than 130/80  Please continue Metoprolol 50mg , 1/2 tablet twice a day Spironolactone 25mg  once a day Increase your hydrochlorothiazide to 25mg  once a day (1 whole tablet) Change your amlodipine 10mg  to once daily in the evening  Please continue to monitor your blood pressure at home. Try to measure 1-2 hours after your medications  Laural Golden, PharmD, BCACP, CDCES, CPP [address] [place], [place], [address] Phone: [phone], Fax: [phone]

## 2022-06-28 NOTE — Progress Notes (Signed)
Patient ID: Karen Saunders                 DOB: 1955-02-28                      MRN: [id number]     HPI: Karen Saunders is a 67 y.o. female referred by Dr. Allyson Sabal to HTN clinic. PMH is significant for CAD, HTN, and hx of CABG.  Takes carbamazepine for tinnitus.  Patient presents today for follow up to discuss her uncontrolled HTN. Patient previously confused regarding medication regimen. At the end of the last visit medications were reconciled with patient on the phone. Now on amlodipine 10mg  (although she has not taken in 2 days, needs to pick up from pharmacy), hydrochlorothiazide 25mg  (patient reports taking 12.5mg  BID), spironolactone 25mg  daily, and metoprolol 25mg  BID.  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 Only has 3 readings icne last visit but they are improved from previous:  6/8: 137/86 6/15: 152/95 6/17: 143/85   Current HTN meds:  Amlodipine 10mg  mg daily in the morning Metoprolol tartrate 25mg  BID Spironolctone 25mg  daily in the morning HCTZ 12.5mg  BID  Previously tried:  Lisinopril Telmisartan/HCTZ   BP goal: <130/80  Home BP readings:   6/8: 137/86 6/15: 152/95 6/17: 143/85   Does not forget to take medications  Wt Readings from Last 3 Encounters:  03/08/22 115 lb 9.6 oz (52.4 kg)  02/16/22 116 lb (52.6 kg)  02/14/22 112 lb (50.8 kg)   BP Readings from Last 3 Encounters:  04/11/22 (!) 178/89  03/08/22 (!) 162/90  02/16/22 (!) 160/92   Pulse Readings from Last 3 Encounters:  04/11/22 (!) 54  03/08/22 (!) 57  02/16/22 (!) 55    Renal function: CrCl cannot be calculated (Patient's most recent lab result is older than the maximum 21 days allowed.).  Past Medical History:  Diagnosis Date   Anemia    with prev unremarkable heme eval ~2011   CAD (coronary artery disease)    Dr. Donnie Aho with Cards   History of gastric ulcer    2005    Hyperlipidemia    Hypertension    IBS  (irritable bowel syndrome)    Leukopenia    with prev unremarkable heme eval ~2011   Migraine    Unsp w/o intract w/o status migrainosus   Osteoporosis 11/18/2004   Shingles    Trigeminal neuralgia 03/05/2012    Current Outpatient Medications on File Prior to Visit  Medication Sig Dispense Refill   gabapentin (NEURONTIN) 100 MG capsule Take 100 mg by mouth daily as needed.     amLODipine (NORVASC) 10 MG tablet Take 1 tablet (10 mg total) by mouth daily.     butalbital-acetaminophen-caffeine (FIORICET) 50-325-40 MG tablet Take 1 tablet by mouth every 4 (four) hours as needed for headache. 20 tablet 1   carbamazepine (TEGRETOL) 200 MG tablet TAKE 2 TABLETS IN THE MORNING AND 2 TABLETS AT NIGHT     dexlansoprazole (DEXILANT) 60 MG capsule Take 60 mg by mouth daily.     ezetimibe (ZETIA) 10 MG tablet TAKE 1 TABLET EVERY DAY 90 tablet 3   levocetirizine (XYZAL) 5 MG tablet TAKE 1 TABLET (5 MG TOTAL) BY MOUTH DAILY. 90 tablet 4   metoprolol tartrate (LOPRESSOR) 50 MG tablet Take 0.5 tablets (25 mg total) by mouth 2 (two) times daily. 90 tablet 3   rosuvastatin (CRESTOR) 40 MG  tablet Take 1 tablet (40 mg total) by mouth daily. 30 tablet 11   spironolactone (ALDACTONE) 25 MG tablet TAKE 1 TABLET BY MOUTH EVERY DAY 90 tablet 3   traZODone (DESYREL) 50 MG tablet Take 1 tablet (50 mg total) by mouth at bedtime. 90 tablet 1   No current facility-administered medications on file prior to visit.    Allergies  Allergen Reactions   Gabapentin     Other Reaction(s): Confusion (intolerance), Delusions (intolerance)   Amoxicillin-Pot Clavulanate     REACTION: Rash on tongue   Azithromycin Other (See Comments)    diarrhea   Codeine Nausea Only   Delsym [Dextromethorphan]     GI upset.   Doxycycline     GI intolerance   Hydralazine     Nausea    Hydrochlorothiazide     Hyponatremia at 12.5mg  dose   Ibandronate Sodium     REACTION: GI side effects   Iohexol      Desc: HIVES,NASAL CONGESTION  DURING A CARDIAC CATH. REQUIRES PRE-MEDS.    Raloxifene     REACTION: GI upset   Sulfonamide Derivatives     REACTION: itching   Zoledronic Acid     REACTION: unable to take this as she was intolerant of pre-tx calcium and vitamin D   Iodinated Contrast Media Rash     Assessment/Plan:  1. Hypertension - HYPERTENSION CONTROL Vitals:   06/28/22 1118 06/28/22 1119  BP: (!) 159/82 (!) 163/86    The patient's blood pressure is elevated above target today.  In order to address the patient's elevated BP: A current anti-hypertensive medication was adjusted today.; Blood pressure will be monitored at home to determine if medication changes need to be made.     BP in room today 159/82 which remains above goal of <130/80 but is improving.Has not taken amlodipine yet today due to being out. Recommended changing hydrochlorothiazide from 12.5mg  BID to 25mg  once daily in the morning and changing amlodipine administration to 10mg  in the evening. Patient voiced understanding. Recheck in 4 weeks.  Continue: Spironolactone 25mg  daily Metoprolol 25mg  BID Hydrochlorothiazide 25mg  in the morning Amlodipine 10mg  in the evening Recheck in 4 weeks  Laural Golden, PharmD, BCACP, CDCES, CPP [address] [place], [place], [address] Phone: [phone], Fax: [phone]

## 2022-07-04 ENCOUNTER — Encounter: Payer: Self-pay | Admitting: Hematology and Oncology

## 2022-07-04 ENCOUNTER — Inpatient Hospital Stay: Payer: Medicare Other | Attending: Hematology and Oncology

## 2022-07-04 VITALS — BP 151/93 | HR 56 | Temp 98.9°F | Resp 16

## 2022-07-04 DIAGNOSIS — D509 Iron deficiency anemia, unspecified: Secondary | ICD-10-CM | POA: Diagnosis present

## 2022-07-04 DIAGNOSIS — D539 Nutritional anemia, unspecified: Secondary | ICD-10-CM

## 2022-07-04 DIAGNOSIS — D72819 Decreased white blood cell count, unspecified: Secondary | ICD-10-CM | POA: Insufficient documentation

## 2022-07-04 MED ORDER — CYANOCOBALAMIN 1000 MCG/ML IJ SOLN
1000.0000 ug | Freq: Once | INTRAMUSCULAR | Status: AC
  Administered 2022-07-04: 1000 ug via INTRAMUSCULAR
  Filled 2022-07-04: qty 1

## 2022-07-21 ENCOUNTER — Other Ambulatory Visit: Payer: Self-pay | Admitting: Family Medicine

## 2022-07-27 ENCOUNTER — Other Ambulatory Visit: Payer: Self-pay | Admitting: *Deleted

## 2022-07-27 DIAGNOSIS — I10 Essential (primary) hypertension: Secondary | ICD-10-CM

## 2022-07-27 DIAGNOSIS — E782 Mixed hyperlipidemia: Secondary | ICD-10-CM

## 2022-07-27 DIAGNOSIS — I2510 Atherosclerotic heart disease of native coronary artery without angina pectoris: Secondary | ICD-10-CM

## 2022-07-27 DIAGNOSIS — Z951 Presence of aortocoronary bypass graft: Secondary | ICD-10-CM

## 2022-07-27 MED ORDER — EZETIMIBE 10 MG PO TABS: 10.0000 mg | ORAL_TABLET | Freq: Every day | ORAL | 3 refills | Status: DC

## 2022-07-27 MED ORDER — SPIRONOLACTONE 25 MG PO TABS
25.0000 mg | ORAL_TABLET | Freq: Every day | ORAL | 3 refills | Status: DC
Start: 2022-07-27 — End: 2022-11-18

## 2022-07-27 NOTE — Addendum Note (Signed)
Addended by: Burnetta Sabin on: 07/27/2022 10:12 AM   Modules accepted: Orders

## 2022-08-01 NOTE — Progress Notes (Deleted)
Patient ID: HAMNA ASA                 DOB: 1955/12/12                      MRN: [id number]     HPI: Karen Saunders is a 67 y.o. female referred by Dr. Allyson Sabal to HTN clinic. PMH is significant for CAD, HTN, and hx of CABG.  Takes carbamazepine for tinnitus.  Patient presents today for follow up to discuss her uncontrolled HTN. Patient previously confused regarding medication regimen. At the end of the last visit medications were reconciled with patient on the phone. Now on amlodipine 10mg  (although she has not taken in 2 days, needs to pick up from pharmacy), hydrochlorothiazide 25mg  (patient reports taking 12.5mg  BID), spironolactone 25mg  daily, and metoprolol 25mg  BID.  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 Only has 3 readings icne last visit but they are improved from previous:  6/8: 137/86 6/15: 152/95 6/17: 143/85   Current HTN meds:  Amlodipine 10mg  mg daily in the morning Metoprolol tartrate 25mg  BID Spironolctone 25mg  daily in the morning HCTZ 12.5mg  BID  Previously tried:  Lisinopril Telmisartan/HCTZ   BP goal: <130/80  Home BP readings:   6/8: 137/86 6/15: 152/95 6/17: 143/85   Does not forget to take medications  Wt Readings from Last 3 Encounters:  03/08/22 115 lb 9.6 oz (52.4 kg)  02/16/22 116 lb (52.6 kg)  02/14/22 112 lb (50.8 kg)   BP Readings from Last 3 Encounters:  04/11/22 (!) 178/89  03/08/22 (!) 162/90  02/16/22 (!) 160/92   Pulse Readings from Last 3 Encounters:  04/11/22 (!) 54  03/08/22 (!) 57  02/16/22 (!) 55    Renal function: CrCl cannot be calculated (Patient's most recent lab result is older than the maximum 21 days allowed.).  Past Medical History:  Diagnosis Date   Anemia    with prev unremarkable heme eval ~2011   CAD (coronary artery disease)    Dr. Donnie Aho with Cards   History of gastric ulcer    2005    Hyperlipidemia    Hypertension    IBS  (irritable bowel syndrome)    Leukopenia    with prev unremarkable heme eval ~2011   Migraine    Unsp w/o intract w/o status migrainosus   Osteoporosis 11/18/2004   Shingles    Trigeminal neuralgia 03/05/2012    Current Outpatient Medications on File Prior to Visit  Medication Sig Dispense Refill   gabapentin (NEURONTIN) 100 MG capsule Take 100 mg by mouth daily as needed.     amLODipine (NORVASC) 10 MG tablet Take 1 tablet (10 mg total) by mouth daily.     butalbital-acetaminophen-caffeine (FIORICET) 50-325-40 MG tablet Take 1 tablet by mouth every 4 (four) hours as needed for headache. 20 tablet 1   carbamazepine (TEGRETOL) 200 MG tablet TAKE 2 TABLETS IN THE MORNING AND 2 TABLETS AT NIGHT     dexlansoprazole (DEXILANT) 60 MG capsule Take 60 mg by mouth daily.     ezetimibe (ZETIA) 10 MG tablet TAKE 1 TABLET EVERY DAY 90 tablet 3   levocetirizine (XYZAL) 5 MG tablet TAKE 1 TABLET (5 MG TOTAL) BY MOUTH DAILY. 90 tablet 4   metoprolol tartrate (LOPRESSOR) 50 MG tablet Take 0.5 tablets (25 mg total) by mouth 2 (two) times daily. 90 tablet 3   rosuvastatin (CRESTOR) 40 MG  tablet Take 1 tablet (40 mg total) by mouth daily. 30 tablet 11   spironolactone (ALDACTONE) 25 MG tablet TAKE 1 TABLET BY MOUTH EVERY DAY 90 tablet 3   traZODone (DESYREL) 50 MG tablet Take 1 tablet (50 mg total) by mouth at bedtime. 90 tablet 1   No current facility-administered medications on file prior to visit.    Allergies  Allergen Reactions   Gabapentin     Other Reaction(s): Confusion (intolerance), Delusions (intolerance)   Amoxicillin-Pot Clavulanate     REACTION: Rash on tongue   Azithromycin Other (See Comments)    diarrhea   Codeine Nausea Only   Delsym [Dextromethorphan]     GI upset.   Doxycycline     GI intolerance   Hydralazine     Nausea    Hydrochlorothiazide     Hyponatremia at 12.5mg  dose   Ibandronate Sodium     REACTION: GI side effects   Iohexol      Desc: HIVES,NASAL CONGESTION  DURING A CARDIAC CATH. REQUIRES PRE-MEDS.    Raloxifene     REACTION: GI upset   Sulfonamide Derivatives     REACTION: itching   Zoledronic Acid     REACTION: unable to take this as she was intolerant of pre-tx calcium and vitamin D   Iodinated Contrast Media Rash     Assessment/Plan:  1. Hypertension - No BP recorded.  {Refresh Note OR Click here to enter BP  :1}***  BP in room today 159/82 which remains above goal of <130/80 but is improving.Has not taken amlodipine yet today due to being out. Recommended changing hydrochlorothiazide from 12.5mg  BID to 25mg  once daily in the morning and changing amlodipine administration to 10mg  in the evening. Patient voiced understanding. Recheck in 4 weeks.  Continue: Spironolactone 25mg  daily Metoprolol 25mg  BID Hydrochlorothiazide 25mg  in the morning Amlodipine 10mg  in the evening Recheck in 4 weeks  Laural Golden, PharmD, BCACP, CDCES, CPP [address] [place], [place], [address] Phone: [phone], Fax: [phone]

## 2022-08-02 ENCOUNTER — Ambulatory Visit: Payer: Medicare Other

## 2022-08-03 ENCOUNTER — Inpatient Hospital Stay: Payer: Medicare Other | Attending: Hematology and Oncology

## 2022-08-03 DIAGNOSIS — D539 Nutritional anemia, unspecified: Secondary | ICD-10-CM | POA: Insufficient documentation

## 2022-08-03 MED ORDER — CYANOCOBALAMIN 1000 MCG/ML IJ SOLN
1000.0000 ug | Freq: Once | INTRAMUSCULAR | Status: AC
  Administered 2022-08-03: 1000 ug via INTRAMUSCULAR
  Filled 2022-08-03: qty 1

## 2022-08-03 NOTE — Patient Instructions (Signed)
Vitamin B12 Injection What is this medication? Vitamin B12 (VAHY tuh min B12) prevents and treats low vitamin B12 levels in your body. It is used in people who do not get enough vitamin B12 from their diet or when their digestive tract does not absorb enough. Vitamin B12 plays an important role in maintaining the health of your nervous system and red blood cells. This medicine may be used for other purposes; ask your health care provider or pharmacist if you have questions. COMMON BRAND NAME(S): B-12 Compliance Kit, B-12 Injection Kit, Cyomin, Dodex, LA-12, Nutri-Twelve, Physicians EZ Use B-12, Primabalt, Vitamin Deficiency Injectable System - B12 What should I tell my care team before I take this medication? They need to know if you have any of these conditions: Kidney disease Leber's disease Megaloblastic anemia An unusual or allergic reaction to cyanocobalamin, cobalt, other medications, foods, dyes, or preservatives Pregnant or trying to get pregnant Breast-feeding How should I use this medication? This medication is injected into a muscle or deeply under the skin. It is usually given in a clinic or care team's office. However, your care team may teach you how to inject yourself. Follow all instructions. Talk to your care team about the use of this medication in children. Special care may be needed. Overdosage: If you think you have taken too much of this medicine contact a poison control center or emergency room at once. NOTE: This medicine is only for you. Do not share this medicine with others. What if I miss a dose? If you are given your dose at a clinic or care team's office, call to reschedule your appointment. If you give your own injections, and you miss a dose, take it as soon as you can. If it is almost time for your next dose, take only that dose. Do not take double or extra doses. What may interact with this medication? Alcohol Colchicine This list may not describe all possible  interactions. Give your health care provider a list of all the medicines, herbs, non-prescription drugs, or dietary supplements you use. Also tell them if you smoke, drink alcohol, or use illegal drugs. Some items may interact with your medicine. What should I watch for while using this medication? Visit your care team regularly. You may need blood work done while you are taking this medication. You may need to follow a special diet. Talk to your care team. Limit your alcohol intake and avoid smoking to get the best benefit. What side effects may I notice from receiving this medication? Side effects that you should report to your care team as soon as possible: Allergic reactions--skin rash, itching, hives, swelling of the face, lips, tongue, or throat Swelling of the ankles, hands, or feet Trouble breathing Side effects that usually do not require medical attention (report to your care team if they continue or are bothersome): Diarrhea This list may not describe all possible side effects. Call your doctor for medical advice about side effects. You may report side effects to FDA at 1-800-FDA-1088. Where should I keep my medication? Keep out of the reach of children. Store at room temperature between 15 and 30 degrees C (59 and 85 degrees F). Protect from light. Throw away any unused medication after the expiration date. NOTE: This sheet is a summary. It may not cover all possible information. If you have questions about this medicine, talk to your doctor, pharmacist, or health care provider.  2024 Elsevier/Gold Standard (2020-09-08 00:00:00)

## 2022-08-23 ENCOUNTER — Ambulatory Visit: Payer: Medicare Other | Attending: Cardiovascular Disease | Admitting: Student

## 2022-08-23 ENCOUNTER — Encounter: Payer: Self-pay | Admitting: Student

## 2022-08-23 VITALS — BP 154/80 | HR 51 | Ht 62.0 in | Wt 120.0 lb

## 2022-08-23 DIAGNOSIS — I10 Essential (primary) hypertension: Secondary | ICD-10-CM | POA: Diagnosis not present

## 2022-08-23 NOTE — Assessment & Plan Note (Signed)
Assessment: BP is uncontrolled in office BP 154/80 mmHg heart rate 51 (goal <130/80)  Home BP ~150-160/80 range does not recall HR  Takes and Tolerates current BP medications well without any side effects Denies SOB, palpitation, chest pain, headaches,or swelling Reiterated the importance of regular exercise and low salt diet   Lisinopril was D/C due to decline renal function beginning of the year last BMP ScCr was improved significantly however will re check BMP today and if SrCr/ CrCl WNL will initiate long acting ARB  Plan:   Ordered BMP today If Renal function WNL will initiate olmesartan 20 mg daily  Continue taking Amlodipine 10mg  mg daily in the morning,Metoprolol tartrate 25mg  twice daily,        Spironolactone 25mg  daily in the morning, and HCTZ 12.5mg  BID Patient to keep record of BP readings with heart rate and report to [place] at the next visit Patient to see PharmD in 4 weeks for follow up

## 2022-08-23 NOTE — Patient Instructions (Addendum)
Changes made by your pharmacist Carmela Hurt, PharmD at today's visit:    Instructions/Changes  (what do you need to do) Your Notes  (what you did and when you did it)  Continue taking  Amlodipine 10mg  mg daily in the morning Metoprolol tartrate 25mg  BID Spironolctone 25mg  daily in the morning HCTZ 12.5mg  BID   Will get BMP lab today and based on the result amy start Olmesartan 20 mg daily    Cut down on eating out and start doing regular exercise ( 150 min per week)    Bring all of your meds, your BP cuff and your record of home blood pressures to your next appointment.    HOW TO TAKE YOUR BLOOD PRESSURE AT HOME  Rest 5 minutes before taking your blood pressure.  Don't smoke or drink caffeinated beverages for at least 30 minutes before. Take your blood pressure before (not after) you eat. Sit comfortably with your back supported and both feet on the floor (don't cross your legs). Elevate your arm to heart level on a table or a desk. Use the proper sized cuff. It should fit smoothly and snugly around your bare upper arm. There should be enough room to slip a fingertip under the cuff. The bottom edge of the cuff should be 1 inch above the crease of the elbow. Ideally, take 3 measurements at one sitting and record the average.  Important lifestyle changes to control high blood pressure  Intervention  Effect on the BP  Lose extra pounds and watch your waistline Weight loss is one of the most effective lifestyle changes for controlling blood pressure. If you're overweight or obese, losing even a small amount of weight can help reduce blood pressure. Blood pressure might go down by about 1 millimeter of mercury (mm Hg) with each kilogram (about 2.2 pounds) of weight lost.  Exercise regularly As a general goal, aim for at least 30 minutes of moderate physical activity every day. Regular physical activity can lower high blood pressure by about 5 to 8 mm Hg.  Eat a healthy diet Eating a  diet rich in whole grains, fruits, vegetables, and low-fat dairy products and low in saturated fat and cholesterol. A healthy diet can lower high blood pressure by up to 11 mm Hg.  Reduce salt (sodium) in your diet Even a small reduction of sodium in the diet can improve heart health and reduce high blood pressure by about 5 to 6 mm Hg.  Limit alcohol One drink equals 12 ounces of beer, 5 ounces of wine, or 1.5 ounces of 80-proof liquor.  Limiting alcohol to less than one drink a day for women or two drinks a day for men can help lower blood pressure by about 4 mm Hg.   If you have any questions or concerns please use My Chart to send questions or call the office at [phone]

## 2022-08-23 NOTE — Progress Notes (Signed)
Patient ID: Karen Saunders                 DOB: 1955/11/27                      MRN: [id number]      HPI: Karen Saunders is a 67 y.o. female referred by Dr. Allyson Sabal to HTN clinic. PMH is significant for CAD, HTN, and hx of CABG.  Takes carbamazepine for tinnitus.  Today patient presented in good spirit. Have only 3 home BP readings. It ~150-160 /80 range. Does not recall HR. Reports she takes her medications regularly and tolerates them well. She watches her diet for slat when she cooks at home but she eats 8 meals per week outside where she does not have control for salt intake. Will be reducing to eat out. Doe not do any exercise but willing to start 10 min walk twice daily most days of the week. Can't recall why was lisinopril stopped. When she was in hospital early [name] her renal function declined. However SrCr was improved a lot in April BMP.   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 Only has 3 readings since the last  visit   173/96  153/90 161/94   Current HTN meds:  Amlodipine 10mg  mg daily in the morning Metoprolol tartrate 25mg  BID Spironolctone 25mg  daily in the morning HCTZ 12.5mg  BID  Exercise: none   Previously tried:  Lisinopril Telmisartan/HCTZ   BP goal: <130/80  Home BP readings: ~150-160/80 range    Wt Readings from Last 3 Encounters:  08/23/22 120 lb (54.4 kg)  05/24/22 122 lb 12 oz (55.7 kg)  05/09/22 122 lb (55.3 kg)   BP Readings from Last 3 Encounters:  08/23/22 (!) 154/80  07/04/22 (!) 151/93  06/28/22 (!) 163/86   Pulse Readings from Last 3 Encounters:  08/23/22 (!) 51  07/04/22 (!) 56  06/28/22 (!) 51    Renal function: Estimated Creatinine Clearance: 32.5 mL/min (A) (by C-G formula based on SCr of 1.31 mg/dL (H)).  Past Medical History:  Diagnosis Date   Anemia    with prev unremarkable heme eval ~2011   CAD (coronary artery disease)    Dr. Donnie Aho with Cards    History of gastric ulcer    2005    Hyperlipidemia    Hypertension    IBS (irritable bowel syndrome)    Leukopenia    with prev unremarkable heme eval ~2011   Migraine    Unsp w/o intract w/o status migrainosus   Osteoporosis 11/18/2004   Shingles    Trigeminal neuralgia 03/05/2012    Current Outpatient Medications on File Prior to Visit  Medication Sig Dispense Refill   amLODipine (NORVASC) 10 MG tablet Take 1 tablet (10 mg total) by mouth daily. 90 tablet 1   butalbital-acetaminophen-caffeine (FIORICET) 50-325-40 MG tablet Take 1 tablet by mouth every 4 (four) hours as needed for headache. 20 tablet 1   carbamazepine (TEGRETOL) 200 MG tablet TAKE 2 TABLETS IN THE MORNING AND 2 TABLETS AT NIGHT     dexlansoprazole (DEXILANT) 60 MG capsule Take 60 mg by mouth daily.     ezetimibe (ZETIA) 10 MG tablet Take 1 tablet (10 mg total) by mouth daily. 90 tablet 3   gabapentin (NEURONTIN) 100 MG capsule Take 100 mg by mouth daily as needed.     hydrochlorothiazide (HYDRODIURIL) 25 MG tablet Take 1 tablet (  25 mg total) by mouth daily. 90 tablet 3   levocetirizine (XYZAL) 5 MG tablet TAKE 1 TABLET (5 MG TOTAL) BY MOUTH DAILY. 90 tablet 4   metoprolol tartrate (LOPRESSOR) 50 MG tablet Take 0.5 tablets (25 mg total) by mouth 2 (two) times daily. 90 tablet 3   rosuvastatin (CRESTOR) 40 MG tablet TAKE 1 TABLET BY MOUTH EVERY DAY 90 tablet 3   spironolactone (ALDACTONE) 25 MG tablet Take 1 tablet (25 mg total) by mouth daily. 90 tablet 3   traZODone (DESYREL) 50 MG tablet TAKE 1 TABLET BY MOUTH EVERYDAY AT BEDTIME 90 tablet 1   valACYclovir (VALTREX) 500 MG tablet Take 500 mg by mouth daily.     No current facility-administered medications on file prior to visit.    Allergies  Allergen Reactions   Gabapentin     Other Reaction(s): Confusion (intolerance), Delusions (intolerance)   Amoxicillin-Pot Clavulanate     REACTION: Rash on tongue   Azithromycin Other (See Comments)    diarrhea   Codeine  Nausea Only   Delsym [Dextromethorphan]     GI upset.   Doxycycline     GI intolerance   Hydralazine     Nausea    Hydrochlorothiazide     Hyponatremia at 12.5mg  dose   Ibandronate Sodium     REACTION: GI side effects   Iohexol      Desc: HIVES,NASAL CONGESTION DURING A CARDIAC CATH. REQUIRES PRE-MEDS.    Raloxifene     REACTION: GI upset   Sulfonamide Derivatives     REACTION: itching   Venofer [Iron Sucrose] Other (See Comments) and Hypertension    Pt had rxn to Venofer see Hypersensitivity note from 05/24/2022.   Zoledronic Acid     REACTION: unable to take this as she was intolerant of pre-tx calcium and vitamin D   Iodinated Contrast Media Rash    Assessment/Plan:  1. Hypertension -  Essential hypertension Assessment: BP is uncontrolled in office BP 154/80 mmHg heart rate 51 (goal <130/80)  Home BP ~150-160/80 range does not recall HR  Takes and Tolerates current BP medications well without any side effects Denies SOB, palpitation, chest pain, headaches,or swelling Reiterated the importance of regular exercise and low salt diet   Lisinopril was D/C due to decline renal function beginning of the year last BMP ScCr was improved significantly however will re check BMP today and if SrCr/ CrCl WNL will initiate long acting ARB  Plan:   Ordered BMP today If Renal function WNL will initiate olmesartan 20 mg daily  Continue taking Amlodipine 10mg  mg daily in the morning,Metoprolol tartrate 25mg  twice daily,        Spironolactone 25mg  daily in the morning, and HCTZ 12.5mg  BID Patient to keep record of BP readings with heart rate and report to [place] at the next visit Patient to see PharmD in 4 weeks for follow up    Thank you  Carmela Hurt, Pharm.D [facility] Division of [facility] [address] [place], [place] [address]  Phone: [phone]; Fax: [phone]

## 2022-08-25 ENCOUNTER — Telehealth: Payer: Self-pay | Admitting: Pharmacist

## 2022-08-25 DIAGNOSIS — I10 Essential (primary) hypertension: Secondary | ICD-10-CM

## 2022-08-25 NOTE — Addendum Note (Signed)
Addended by: Tylene Fantasia on: 08/25/2022 09:06 AM   Modules accepted: Orders

## 2022-08-25 NOTE — Telephone Encounter (Signed)
Call to discuss BMP result LVM to call me back at [phone]

## 2022-08-25 NOTE — Telephone Encounter (Signed)
Spoke to patient, reports her water intake was reduced for past few days. She simply forget to drink enough water during the day. Reiterated importance of hydration and will be going for BMP next Wed Aug 21 at [facility] office.

## 2022-08-31 ENCOUNTER — Other Ambulatory Visit: Payer: Self-pay

## 2022-08-31 DIAGNOSIS — I10 Essential (primary) hypertension: Secondary | ICD-10-CM

## 2022-09-01 ENCOUNTER — Telehealth: Payer: Self-pay | Admitting: Pharmacist

## 2022-09-01 LAB — BASIC METABOLIC PANEL
BUN/Creatinine Ratio: 21 (ref 12–28)
BUN: 26 mg/dL (ref 8–27)
CO2: 25 mmol/L (ref 20–29)
Calcium: 9.7 mg/dL (ref 8.7–10.3)
Chloride: 98 mmol/L (ref 96–106)
Creatinine, Ser: 1.23 mg/dL — ABNORMAL HIGH (ref 0.57–1.00)
Glucose: 86 mg/dL (ref 70–99)
Potassium: 4.5 mmol/L (ref 3.5–5.2)
Sodium: 138 mmol/L (ref 134–144)
eGFR: 48 mL/min/{1.73_m2} — ABNORMAL LOW (ref >59)

## 2022-09-01 NOTE — Telephone Encounter (Signed)
Call to discuss BMP result.  CrCL 38 mL/min SrCr tending up but still above the baseline. Will consider adding direct vasodilator like hydralazine 25 mg three times daily for elevated BP. Once renal function near baseline can consider adding ARB.

## 2022-09-02 ENCOUNTER — Other Ambulatory Visit: Payer: Self-pay | Admitting: Hematology and Oncology

## 2022-09-02 ENCOUNTER — Telehealth: Payer: Self-pay | Admitting: Cardiovascular Disease

## 2022-09-02 ENCOUNTER — Inpatient Hospital Stay: Payer: Medicare Other | Attending: Hematology and Oncology | Admitting: Hematology and Oncology

## 2022-09-02 ENCOUNTER — Telehealth: Payer: Self-pay

## 2022-09-02 ENCOUNTER — Inpatient Hospital Stay: Payer: Medicare Other

## 2022-09-02 ENCOUNTER — Encounter: Payer: Self-pay | Admitting: Hematology and Oncology

## 2022-09-02 VITALS — BP 173/86 | HR 55 | Temp 97.4°F | Resp 18 | Ht 62.0 in | Wt 120.8 lb

## 2022-09-02 DIAGNOSIS — D696 Thrombocytopenia, unspecified: Secondary | ICD-10-CM | POA: Diagnosis not present

## 2022-09-02 DIAGNOSIS — D508 Other iron deficiency anemias: Secondary | ICD-10-CM

## 2022-09-02 DIAGNOSIS — D72819 Decreased white blood cell count, unspecified: Secondary | ICD-10-CM | POA: Diagnosis not present

## 2022-09-02 DIAGNOSIS — D509 Iron deficiency anemia, unspecified: Secondary | ICD-10-CM | POA: Diagnosis present

## 2022-09-02 DIAGNOSIS — E538 Deficiency of other specified B group vitamins: Secondary | ICD-10-CM

## 2022-09-02 DIAGNOSIS — D539 Nutritional anemia, unspecified: Secondary | ICD-10-CM

## 2022-09-02 LAB — CBC WITH DIFFERENTIAL (CANCER CENTER ONLY)
Abs Immature Granulocytes: 0.01 10*3/uL (ref 0.00–0.07)
Basophils Absolute: 0 10*3/uL (ref 0.0–0.1)
Basophils Relative: 1 %
Eosinophils Absolute: 0 10*3/uL (ref 0.0–0.5)
Eosinophils Relative: 1 %
HCT: 30.4 % — ABNORMAL LOW (ref 36.0–46.0)
Hemoglobin: 11 g/dL — ABNORMAL LOW (ref 12.0–15.0)
Immature Granulocytes: 1 %
Lymphocytes Relative: 25 %
Lymphs Abs: 0.5 10*3/uL — ABNORMAL LOW (ref 0.7–4.0)
MCH: 32.4 pg (ref 26.0–34.0)
MCHC: 36.2 g/dL — ABNORMAL HIGH (ref 30.0–36.0)
MCV: 89.4 fL (ref 80.0–100.0)
Monocytes Absolute: 0.2 10*3/uL (ref 0.1–1.0)
Monocytes Relative: 11 %
Neutro Abs: 1.4 10*3/uL — ABNORMAL LOW (ref 1.7–7.7)
Neutrophils Relative %: 61 %
Platelet Count: 104 10*3/uL — ABNORMAL LOW (ref 150–400)
RBC: 3.4 MIL/uL — ABNORMAL LOW (ref 3.87–5.11)
RDW: 13.1 % (ref 11.5–15.5)
WBC Count: 2.2 10*3/uL — ABNORMAL LOW (ref 4.0–10.5)
nRBC: 0 % (ref 0.0–0.2)

## 2022-09-02 LAB — FERRITIN: Ferritin: 316 ng/mL — ABNORMAL HIGH (ref 11–307)

## 2022-09-02 LAB — IRON AND IRON BINDING CAPACITY (CC-WL,HP ONLY)
Iron: 58 ug/dL (ref 28–170)
Saturation Ratios: 17 % (ref 10.4–31.8)
TIBC: 344 ug/dL (ref 250–450)
UIBC: 286 ug/dL (ref 148–442)

## 2022-09-02 LAB — VITAMIN B12: Vitamin B-12: 238 pg/mL (ref 180–914)

## 2022-09-02 MED ORDER — AMLODIPINE BESYLATE 10 MG PO TABS: 10.0000 mg | ORAL_TABLET | Freq: Every day | ORAL | 3 refills | Status: DC

## 2022-09-02 MED ORDER — HYDROCHLOROTHIAZIDE 25 MG PO TABS: 25.0000 mg | ORAL_TABLET | Freq: Every day | ORAL | 3 refills | Status: DC

## 2022-09-02 MED ORDER — CYANOCOBALAMIN 1000 MCG/ML IJ SOLN
1000.0000 ug | Freq: Once | INTRAMUSCULAR | Status: AC
  Administered 2022-09-02: 1000 ug via INTRAMUSCULAR
  Filled 2022-09-02: qty 1

## 2022-09-02 NOTE — Progress Notes (Addendum)
Cardiology Office Note:    Date:  09/06/2022   ID:  RANDY RITA, DOB 1955/01/19, MRN [id number]  PCP:  Joaquim Nam, MD  Cardiologist:  None  Electrophysiologist:  None   Referring MD: Joaquim Nam, MD   Chief Complaint: follow-up of CAD and hypertension  History of Present Illness:    Karen Saunders is a 67 y.o. female with a history of CAD s/p CABG X5 (LIMA -LAD, SVG-OM, SVG-Diag, sequential SVG-PDA-PLA) in 11/2000, hypertension, hyperlipidemia,  pancytopenia, IBS, migraines, and trigeminal neuralgia who is followed by Dr. Allyson Sabal and presents today for routine follow-up of CAD and hypertension.  Patient was previously followed by Dr. Donnie Aho, Dr. Tomie China, Dr. Katrinka Blazing, and now follows with Dr. Allyson Sabal. She has a history of CAD with remote CABG x5 with LIMA to LAD, SVG to OM, SVG to Diag, and sequential SVG to PDA and PLA in 11/2000.Cardiac catheterization in 08/2001 showed patent grafts. Last ischemic evaluation was a Myoview in 01/2014 which was low risk with no evidence of ischemia. Last Echo in 08/2021 showed LVEF of 55-60% with normal wall motion, mild LVH, and grade 1 diastolic dysfunction as well as a mobile atrial septum but no obvious PFO.  She was last seen by Dr. Allyson Sabal in 02/2022 at which time she denied any chest pain or shortness of breath. BP was markedly elevated  in the office. She was asked to keep a BP for 30 days and then follow-up with PharmD. She has since been seen in our [facility] multiple times. Patient had apparently been confused about what she should be taking. Multiple medication changes have been made over the last several months. She was last seen on 08/23/2022 at which time BP was in the 150/80s. Plan was to add Olmesartan if renal function stable; however, creatinine was still elevated above baseline so this was not done. Per telephone note on 09/02/2022, patient reported only taking Amlodipine 5mg  rather than 10mg  daily so she was advised to increase  Amlodipine to 10mg  daily.  Patient presents today for follow-up. She is doing well. She denies any cardiac symptoms. No chest pain, shortness of breath, orthopnea, PND, edema, palpitations, lightheadedness, dizziness, or syncope.  BP is mildly elevated in the office today. Initially 136/70 and then 152/80 on my personal recheck at the end of the visit. She does not have a home BP log for me to review today but does state she checks it regularly and systolic BP is usually in the 272Z to 170s at home.  EKGs/Labs/Other Studies Reviewed:    The following studies were reviewed:  Myoview 02/07/2014: Impression: 1. No reversible ischemia or infarction. 2. Normal left ventricular wall motion. 3. Left ventricular ejection fraction 78% 4. Low-risk stress test findings*. _______________  Echocardiogram 08/24/2021: Impression: 1. Left ventricular ejection fraction, by estimation, is 55 to 60%. The  left ventricle has normal function. The left ventricle has no regional  wall motion abnormalities. There is mild left ventricular hypertrophy.  Left ventricular diastolic parameters  are consistent with Grade I diastolic dysfunction (impaired relaxation).   2. Right ventricular systolic function is normal. The right ventricular  size is normal. There is normal pulmonary artery systolic pressure.   3. Mobile atrial septum no obvious PFO ? prominent eustachian valve.   4. The mitral valve is normal in structure. No evidence of mitral valve  regurgitation. No evidence of mitral stenosis.   5. Calcified non coronary cusp. The aortic valve is tricuspid. There is  mild calcification of the aortic valve. Aortic valve regurgitation is not  visualized. Aortic valve sclerosis is present, with no evidence of aortic  valve stenosis.   6. The inferior vena cava is normal in size with greater than 50%  respiratory variability, suggesting right atrial pressure of 3 mmHg.    EKG:  EKG not ordered today.   Recent  Labs: 01/27/2022: Magnesium 2.3 02/14/2022: ALT 15 08/31/2022: BUN 26; Creatinine, Ser 1.23; Potassium 4.5; Sodium 138 09/02/2022: Hemoglobin 11.0; Platelet Count 104  Recent Lipid Panel    Component Value Date/Time   CHOL 163 11/09/2021 0940   TRIG 71 11/09/2021 0940   HDL 84 11/09/2021 0940   CHOLHDL 1.9 11/09/2021 0940   CHOLHDL 2 12/14/2017 1326   VLDL 17.2 12/14/2017 1326   LDLCALC 65 11/09/2021 0940   LDLDIRECT 83.2 11/09/2010 1244    Physical Exam:    Vital Signs: BP (!) 152/80   Pulse (!) 56   Ht 5\' 6"  (1.676 m)   Wt 120 lb 12.8 oz (54.8 kg)   SpO2 99%   BMI 19.50 kg/m     Wt Readings from Last 3 Encounters:  09/06/22 120 lb 12.8 oz (54.8 kg)  09/02/22 120 lb 12.8 oz (54.8 kg)  08/23/22 120 lb (54.4 kg)     General: 66 y.o. thin African-American female in no acute distress. HEENT: Normocephalic and atraumatic. Sclera clear.  Neck: Supple. No carotid bruits. No JVD. Heart: Bradycardic with normal rhythm. II/VI systolic murmur. No gallops or rubs.  Lungs: No increased work of breathing. Clear to ausculation bilaterally. No wheezes, rhonchi, or rales.  Abdomen: Soft, non-distended, and non-tender to palpation.  MSK: Normal strength and tone for age.  Extremities: No lower extremity edema.    Skin: Warm and dry. Neuro: Alert and oriented x3. No focal deficits. Psych: Normal affect. Responds appropriately.  Assessment:    1. Coronary artery disease involving native coronary artery of native heart without angina pectoris   2. S/P CABG (coronary artery bypass graft)   3. Primary hypertension   4. Hyperlipidemia, unspecified hyperlipidemia type     Plan:    CAD s/p CABG History of remote CABG x5 in 2002. LHC in 2003 showed patent grafts. Last ischemic evaluation in 2016 was low risk with no evidence of ischemia. - No chest pain.  - Does not appear to be on Aspirin. My initial plan was to start Aspirin 81mg  daily. However, after patient left the office I realized  Aspirin was stopped in 01/2022 after she was admitted for a traumatic subarachnoid bleed. Repeat head CT in 02/2022 showed resolution of subarachnoid hemorrhage so it should be okay to resume Aspirin but I will confirm with Neurology. I will update patient on this via MyChart.  - Continue high-intensity statin and Zetia.  Hypertension Patient has had issues with uncontrolled hypertension over the last several months and has been seen multiple times in our [facility]. Looks like there has been some confusion on patient's part on what she should actually be taking.  - BP mildly elevated in the office. Initially 136/70 and then 152/80 on my personal recheck at the end of visit. - Current medication regimen: Amlodipine 10mg  daily, Lopressor 25mg  twice daily, HCTZ 25mg  daily, and Spironolactone 25mg  daily.  - Will stop Lopressor and start Coreg 6.25mg  twice daily instead. - Provided refill of Amlodipine per patient's request. She states she has been taking 5mg  twice daily because she did not have the 10mg  tablets. Advised her  that she can take the 10mg  all at once. - Will repeat BMET today. If renal function has improved, may be able to restart Olmesartan. - She has follow-up in the PharmD [facility]N Clinic in a couple of weeks. Advised patient to bring home BP log and home BP machine to this visit so that we can make sure home machine is accurate.  Hyperlipidemia Lipid panel in 10/2021: Total Cholesterol 163, Triglycerides 71, HDL 84, LDL 65. LDL goal <70 given CAD. - Continue Crestor 40mg  daily and Zetia 10mg  daily.  Disposition: Keep follow-up visit with PharmD on 09/23/2022. Otherwise, follow-up with Dr. Allyson Sabal or myself in 6 months.  ADDENDUM 09/09/2022: Discussed with Dr. Nita Sickle (Neurology), who has seen patient in the past. She agreed that it is okay to restart Aspirin 81mg  daily. Will notify patient.  Creatinine has also improved to 1.19 which looks close to her baseline so will start  Olmesartan 20mg  daily and then repeat a BMET in 1 week.  Medication Adjustments/Labs and Tests Ordered: Current medicines are reviewed at length with the patient today.  Concerns regarding medicines are outlined above.  Orders Placed This Encounter  Procedures   Basic metabolic panel   Meds ordered this encounter  Medications   amLODipine (NORVASC) 10 MG tablet    Sig: Take 1 tablet (10 mg total) by mouth daily.    Dispense:  90 tablet    Refill:  3   aspirin EC 81 MG tablet    Sig: Take 1 tablet (81 mg total) by mouth daily. Swallow whole.   carvedilol (COREG) 6.25 MG tablet    Sig: Take 1 tablet (6.25 mg total) by mouth 2 (two) times daily.    Dispense:  180 tablet    Refill:  3    Patient Instructions  Medication Instructions:  STOP METOPROLOL START CARVEDILOL 6.25 MG TWICE DAILY    START ASPIRIN 81 MG DAILY   *If you need a refill on your cardiac medications before your next appointment, please call your pharmacy*   Lab Work: TODAY: BMET If you have labs (blood work) drawn today and your tests are completely normal, you will receive your results only by: MyChart Message (if you have MyChart) OR A paper copy in the mail If you have any lab test that is abnormal or we need to change your treatment, we will call you to review the results.   Testing/Procedures: NONE   Follow-Up: At [facility], you and your health needs are our priority.  As part of our continuing mission to provide you with exceptional heart care, we have created designated Provider Care Teams.  These Care Teams include your primary Cardiologist (physician) and Advanced Practice Providers (APPs -  Physician Assistants and Nurse Practitioners) who all work together to provide you with the care you need, when you need it.  We recommend signing up for the patient portal called "MyChart".  Sign up information is provided on this After Visit Summary.  MyChart is used to connect with patients for  Virtual Visits (Telemedicine).  Patients are able to view lab/test results, encounter notes, upcoming appointments, etc.  Non-urgent messages can be sent to your provider as well.   To learn more about what you can do with MyChart, go to [email or web address].    Your next appointment:   6 month(s)  Provider:   Dr. Allyson Sabal  or Marjie Skiff, PA-C       Signed, Corrin Parker, PA-C  09/06/2022 4:28 PM  [facility]

## 2022-09-02 NOTE — Assessment & Plan Note (Signed)
This could be constitutional versus due to vitamin B12 deficiency Her B12 level is now back to normal I suspect her chronic leukopenia is constitutional She is not symptomatic Observe only

## 2022-09-02 NOTE — Telephone Encounter (Signed)
Pt returning call to Pharmacist from yesterday regarding results. Please advise

## 2022-09-02 NOTE — Assessment & Plan Note (Signed)
This was relatively new over the past 2 years I suspect this is due to side effects of carbamazepine She is not symptomatic She can continue taking carbamazepine as this is helping her

## 2022-09-02 NOTE — Patient Instructions (Signed)
 Vitamin B12 Injection What is this medication? Vitamin B12 (VAHY tuh min B12) prevents and treats low vitamin B12 levels in your body. It is used in people who do not get enough vitamin B12 from their diet or when their digestive tract does not absorb enough. Vitamin B12 plays an important role in maintaining the health of your nervous system and red blood cells. This medicine may be used for other purposes; ask your health care provider or pharmacist if you have questions. COMMON BRAND NAME(S): B-12 Compliance Kit, B-12 Injection Kit, Cyomin, Dodex, LA-12, Nutri-Twelve, Physicians EZ Use B-12, Primabalt, Vitamin Deficiency Injectable System - B12 What should I tell my care team before I take this medication? They need to know if you have any of these conditions: Kidney disease Leber's disease Megaloblastic anemia An unusual or allergic reaction to cyanocobalamin, cobalt, other medications, foods, dyes, or preservatives Pregnant or trying to get pregnant Breast-feeding How should I use this medication? This medication is injected into a muscle or deeply under the skin. It is usually given in a clinic or care team's office. However, your care team may teach you how to inject yourself. Follow all instructions. Talk to your care team about the use of this medication in children. Special care may be needed. Overdosage: If you think you have taken too much of this medicine contact a poison control center or emergency room at once. NOTE: This medicine is only for you. Do not share this medicine with others. What if I miss a dose? If you are given your dose at a clinic or care team's office, call to reschedule your appointment. If you give your own injections, and you miss a dose, take it as soon as you can. If it is almost time for your next dose, take only that dose. Do not take double or extra doses. What may interact with this medication? Alcohol Colchicine This list may not describe all possible  interactions. Give your health care provider a list of all the medicines, herbs, non-prescription drugs, or dietary supplements you use. Also tell them if you smoke, drink alcohol, or use illegal drugs. Some items may interact with your medicine. What should I watch for while using this medication? Visit your care team regularly. You may need blood work done while you are taking this medication. You may need to follow a special diet. Talk to your care team. Limit your alcohol intake and avoid smoking to get the best benefit. What side effects may I notice from receiving this medication? Side effects that you should report to your care team as soon as possible: Allergic reactions--skin rash, itching, hives, swelling of the face, lips, tongue, or throat Swelling of the ankles, hands, or feet Trouble breathing Side effects that usually do not require medical attention (report to your care team if they continue or are bothersome): Diarrhea This list may not describe all possible side effects. Call your doctor for medical advice about side effects. You may report side effects to FDA at 1-800-FDA-1088. Where should I keep my medication? Keep out of the reach of children. Store at room temperature between 15 and 30 degrees C (59 and 85 degrees F). Protect from light. Throw away any unused medication after the expiration date. NOTE: This sheet is a summary. It may not cover all possible information. If you have questions about this medicine, talk to your doctor, pharmacist, or health care provider.  2024 Elsevier/Gold Standard (2020-09-08 00:00:00)

## 2022-09-02 NOTE — Assessment & Plan Note (Signed)
Her B12 level has improved with monthly injection but is still on the lower limit of normal I recommend vitamin B12 injection once a month for the next 6 months

## 2022-09-02 NOTE — Addendum Note (Signed)
Addended by: Tylene Fantasia on: 09/02/2022 01:24 PM   Modules accepted: Orders

## 2022-09-02 NOTE — Telephone Encounter (Signed)
Called and given b12 results, they are improved. Dr. Bertis Ruddy recommends monthly b12 injections monthly for 6 months. A scheduling message has been sent and the scheduler will call her. She verbalized understanding.

## 2022-09-02 NOTE — Assessment & Plan Note (Signed)
She has a history of iron deficiency anemia Repeat iron studies have improved Observe only

## 2022-09-02 NOTE — Progress Notes (Signed)
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 She is not symptomatic Observe only  Vitamin B12 deficiency Her B12 level has improved with monthly injection but is still on the lower limit of normal I recommend vitamin B12 injection once a month for the next 6 months  Iron deficiency anemia She has a history of iron deficiency anemia Repeat iron studies have improved Observe only  Thrombocytopenia (HCC) This was relatively new over the past 2 years I suspect this is due to side effects of carbamazepine She is not symptomatic She can continue taking carbamazepine as this is helping her  Orders Placed This Encounter  Procedures   CBC with Differential (Cancer Center Only)    Standing Status:   Future    Standing Expiration Date:   09/02/2023   Sedimentation rate    Standing Status:   Future    Standing Expiration Date:   09/02/2023   Vitamin B12    Standing Status:   Future    Standing Expiration Date:   09/02/2023   Iron and Iron Binding Capacity ([facility] only)    Standing Status:   Future    Standing Expiration Date:   09/02/2023   Ferritin    Standing Status:   Future    Standing Expiration Date:   09/02/2023   Basic Metabolic Panel - Cancer Center Only    Standing Status:   Future    Standing Expiration Date:   09/02/2023   Reticulocytes    Standing Status:   Future    Standing Expiration Date:   09/02/2023    The total time spent in the appointment was 25 minutes encounter with patients including review of chart and various tests results, discussions about plan of care and coordination of care plan   All questions were answered. The patient knows to call the clinic with any problems, questions or concerns. No barriers to learning was detected.     Artis Delay, MD 8/23/20242:16 PM  INTERVAL HISTORY: Karen Saunders 67 y.o. female returns for further follow-up for multiple mineral deficiencies She is doing well Denies recent bleeding or infection We reviewed test results SUMMARY OF HEMATOLOGIC HISTORY:  HERLINDA MARMON 67 y.o. female is here because of pancytopenia  She was found to have abnormal CBC from recent blood count She was referred her to see Dr. Darnelle Catalan in September 2011 for similar reasons I reviewed her electronic records dated back to 2010 She has chronic intermittent leukopenia for several years and had extensive workup in the past.  I suspect the cause of leukopenia was constitutional The patient had intermittent anemia and was noted to have iron deficiency based on abnormal iron studies in the past.  She is not taking any iron supplement Most recently, she was noted to have borderline intermittent thrombocytopenia From leukopenia standpoint, she denies chronic infections From anemia standpoint, she have no symptoms of anemia such as recent chest pain on exertion, shortness of breath on minimal exertion, pre-syncopal episodes, or palpitations. From thrombocytopenia standpoint, she denies any recent bleeding such as epistaxis, hematuria or hematochezia The patient denies over the counter NSAID ingestion. She is not on antiplatelets agents. Her last colonoscopy was on 08/11/21 which showed diverticulosis only She had no prior history or diagnosis of cancer. Her age appropriate  screening programs are up-to-date. She denies any pica and eats a variety of diet. She never donated blood or received blood transfusion She received intravenous iron infusion in May 2024 along with B12 injections  I have reviewed the past medical history, past surgical history, social history and family history with the patient and they are unchanged from previous note.  ALLERGIES:  is allergic to gabapentin, amoxicillin-pot clavulanate,  azithromycin, codeine, delsym [dextromethorphan], doxycycline, hydralazine, hydrochlorothiazide, ibandronate sodium, iohexol, raloxifene, sulfonamide derivatives, venofer [iron sucrose], zoledronic acid, and iodinated contrast media.  MEDICATIONS:  Current Outpatient Medications  Medication Sig Dispense Refill   amLODipine (NORVASC) 10 MG tablet Take 1 tablet (10 mg total) by mouth daily. 180 tablet 3   butalbital-acetaminophen-caffeine (FIORICET) 50-325-40 MG tablet Take 1 tablet by mouth every 4 (four) hours as needed for headache. 20 tablet 1   carbamazepine (TEGRETOL) 200 MG tablet TAKE 2 TABLETS IN THE MORNING AND 2 TABLETS AT NIGHT     dexlansoprazole (DEXILANT) 60 MG capsule Take 60 mg by mouth daily.     ezetimibe (ZETIA) 10 MG tablet Take 1 tablet (10 mg total) by mouth daily. 90 tablet 3   gabapentin (NEURONTIN) 100 MG capsule Take 100 mg by mouth daily as needed.     hydrochlorothiazide (HYDRODIURIL) 25 MG tablet Take 1 tablet (25 mg total) by mouth daily. 90 tablet 3   levocetirizine (XYZAL) 5 MG tablet TAKE 1 TABLET (5 MG TOTAL) BY MOUTH DAILY. 90 tablet 4   metoprolol tartrate (LOPRESSOR) 50 MG tablet Take 0.5 tablets (25 mg total) by mouth 2 (two) times daily. 90 tablet 3   rosuvastatin (CRESTOR) 40 MG tablet TAKE 1 TABLET BY MOUTH EVERY DAY 90 tablet 3   spironolactone (ALDACTONE) 25 MG tablet Take 1 tablet (25 mg total) by mouth daily. 90 tablet 3   traZODone (DESYREL) 50 MG tablet TAKE 1 TABLET BY MOUTH EVERYDAY AT BEDTIME 90 tablet 1   valACYclovir (VALTREX) 500 MG tablet Take 500 mg by mouth daily.     No current facility-administered medications for this visit.     REVIEW OF SYSTEMS:   Constitutional: Denies fevers, chills or night sweats Eyes: Denies blurriness of vision Ears, nose, mouth, throat, and face: Denies mucositis or sore throat Respiratory: Denies cough, dyspnea or wheezes Cardiovascular: Denies palpitation, chest discomfort or lower extremity  swelling Gastrointestinal:  Denies nausea, heartburn or change in bowel habits Skin: Denies abnormal skin rashes Lymphatics: Denies new lymphadenopathy or easy bruising Neurological:Denies numbness, tingling or new weaknesses Behavioral/Psych: Mood is stable, no new changes  All other systems were reviewed with the patient and are negative.  PHYSICAL EXAMINATION: ECOG PERFORMANCE STATUS: 0 - Asymptomatic  Vitals:   09/02/22 1140  BP: (!) 173/86  Pulse: (!) 55  Resp: 18  Temp: (!) 97.4 F (36.3 C)  SpO2: 100%   Filed Weights   09/02/22 1140  Weight: 120 lb 12.8 oz (54.8 kg)    GENERAL:alert, no distress and comfortable NEURO: alert & oriented x 3 with fluent speech, no focal motor/sensory deficits  LABORATORY DATA:  I have reviewed the data as listed     Component Value Date/Time   NA 138 08/31/2022 1139   K 4.5 08/31/2022 1139   K 3.8 07/25/2017 0000   CL 98 08/31/2022 1139   CO2 25 08/31/2022 1139   GLUCOSE 86 08/31/2022 1139   GLUCOSE 78 02/23/2022 0950   BUN 26 08/31/2022 1139   CREATININE 1.23 (H) 08/31/2022 1139   CREATININE 1.07  09/17/2019 0000   CREATININE 0.96 03/19/2014 1008   CALCIUM 9.7 08/31/2022 1139   PROT 6.9 02/14/2022 1244   PROT 6.9 11/09/2021 0940   ALBUMIN 4.4 02/14/2022 1244   ALBUMIN 4.9 11/09/2021 0940   AST 23 02/14/2022 1244   AST 29 09/17/2019 0000   ALT 15 02/14/2022 1244   ALT 26 09/17/2019 0000   ALKPHOS 72 02/14/2022 1244   ALKPHOS 120 07/25/2017 0000   BILITOT 0.4 02/14/2022 1244   BILITOT 0.4 11/09/2021 0940   GFRNONAA 58 (L) 01/28/2022 0812   GFRAA 53 (L) 02/13/2018 1625    No results found for: "SPEP", "UPEP"  Lab Results  Component Value Date   WBC 2.2 (L) 09/02/2022   NEUTROABS 1.4 (L) 09/02/2022   HGB 11.0 (L) 09/02/2022   HCT 30.4 (L) 09/02/2022   MCV 89.4 09/02/2022   PLT 104 (L) 09/02/2022      Chemistry      Component Value Date/Time   NA 138 08/31/2022 1139   K 4.5 08/31/2022 1139   K 3.8  07/25/2017 0000   CL 98 08/31/2022 1139   CO2 25 08/31/2022 1139   BUN 26 08/31/2022 1139   CREATININE 1.23 (H) 08/31/2022 1139   CREATININE 1.07 09/17/2019 0000   CREATININE 0.96 03/19/2014 1008   GLU 88 10/27/2020 0000      Component Value Date/Time   CALCIUM 9.7 08/31/2022 1139   ALKPHOS 72 02/14/2022 1244   ALKPHOS 120 07/25/2017 0000   AST 23 02/14/2022 1244   AST 29 09/17/2019 0000   ALT 15 02/14/2022 1244   ALT 26 09/17/2019 0000   BILITOT 0.4 02/14/2022 1244   BILITOT 0.4 11/09/2021 0940

## 2022-09-02 NOTE — Telephone Encounter (Signed)
Patient reports she has been taking only 5 mg of amlodipine as pharmacy has filled 5 mg tablet last time. Suggest to up the dose to 10 mg daily as home BP ~165/96 range. Discussed BMP result and encouraged patient to stay hydrated.  Other BP medication   Metoprolol 50 mg -1/2 tab twice daily  Spironolactone 25 mg once a day Hydrochlorothiazide 25 mg daily  Follow up with PharmD is scheduled on Sept 13, 2024.

## 2022-09-06 ENCOUNTER — Ambulatory Visit: Payer: Medicare Other | Attending: Student | Admitting: Student

## 2022-09-06 ENCOUNTER — Encounter: Payer: Self-pay | Admitting: Student

## 2022-09-06 VITALS — BP 152/80 | HR 56 | Ht 66.0 in | Wt 120.8 lb

## 2022-09-06 DIAGNOSIS — I2510 Atherosclerotic heart disease of native coronary artery without angina pectoris: Secondary | ICD-10-CM

## 2022-09-06 DIAGNOSIS — I10 Essential (primary) hypertension: Secondary | ICD-10-CM | POA: Diagnosis not present

## 2022-09-06 DIAGNOSIS — Z951 Presence of aortocoronary bypass graft: Secondary | ICD-10-CM | POA: Diagnosis not present

## 2022-09-06 DIAGNOSIS — E785 Hyperlipidemia, unspecified: Secondary | ICD-10-CM | POA: Diagnosis not present

## 2022-09-06 MED ORDER — AMLODIPINE BESYLATE 10 MG PO TABS: 10.0000 mg | ORAL_TABLET | Freq: Every day | ORAL | 3 refills | Status: DC

## 2022-09-06 MED ORDER — ASPIRIN 81 MG PO TBEC: 81.0000 mg | DELAYED_RELEASE_TABLET | Freq: Every day | ORAL | Status: AC

## 2022-09-06 MED ORDER — CARVEDILOL 6.25 MG PO TABS: 6.2500 mg | ORAL_TABLET | Freq: Two times a day (BID) | ORAL | 3 refills | Status: DC

## 2022-09-06 NOTE — Patient Instructions (Signed)
Medication Instructions:  STOP METOPROLOL START CARVEDILOL 6.25 MG TWICE DAILY    START ASPIRIN 81 MG DAILY   *If you need a refill on your cardiac medications before your next appointment, please call your pharmacy*   Lab Work: TODAY: BMET If you have labs (blood work) drawn today and your tests are completely normal, you will receive your results only by: MyChart Message (if you have MyChart) OR A paper copy in the mail If you have any lab test that is abnormal or we need to change your treatment, we will call you to review the results.   Testing/Procedures: NONE   Follow-Up: At [facility], you and your health needs are our priority.  As part of our continuing mission to provide you with exceptional heart care, we have created designated Provider Care Teams.  These Care Teams include your primary Cardiologist (physician) and Advanced Practice Providers (APPs -  Physician Assistants and Nurse Practitioners) who all work together to provide you with the care you need, when you need it.  We recommend signing up for the patient portal called "MyChart".  Sign up information is provided on this After Visit Summary.  MyChart is used to connect with patients for Virtual Visits (Telemedicine).  Patients are able to view lab/test results, encounter notes, upcoming appointments, etc.  Non-urgent messages can be sent to your provider as well.   To learn more about what you can do with MyChart, go to [email or web address].    Your next appointment:   6 month(s)  Provider:   Dr. Allyson Sabal  or Marjie Skiff, PA-C

## 2022-09-07 LAB — BASIC METABOLIC PANEL
BUN/Creatinine Ratio: 13 (ref 12–28)
BUN: 16 mg/dL (ref 8–27)
CO2: 27 mmol/L (ref 20–29)
Calcium: 9.5 mg/dL (ref 8.7–10.3)
Chloride: 105 mmol/L (ref 96–106)
Creatinine, Ser: 1.19 mg/dL — ABNORMAL HIGH (ref 0.57–1.00)
Glucose: 82 mg/dL (ref 70–99)
Potassium: 4.8 mmol/L (ref 3.5–5.2)
Sodium: 142 mmol/L (ref 134–144)
eGFR: 50 mL/min/{1.73_m2} — ABNORMAL LOW (ref >59)

## 2022-09-08 ENCOUNTER — Other Ambulatory Visit: Payer: Self-pay

## 2022-09-08 DIAGNOSIS — Z79899 Other long term (current) drug therapy: Secondary | ICD-10-CM

## 2022-09-08 DIAGNOSIS — I10 Essential (primary) hypertension: Secondary | ICD-10-CM

## 2022-09-08 MED ORDER — OLMESARTAN MEDOXOMIL 20 MG PO TABS: 20.0000 mg | ORAL_TABLET | Freq: Every day | ORAL | 6 refills | Status: DC

## 2022-09-09 ENCOUNTER — Telehealth: Payer: Self-pay | Admitting: Hematology and Oncology

## 2022-09-09 NOTE — Telephone Encounter (Signed)
 Left patient a message regarding upcoming appointment times/dates

## 2022-09-13 LAB — HM MAMMOGRAPHY

## 2022-09-23 ENCOUNTER — Ambulatory Visit: Payer: Medicare Other | Attending: Internal Medicine | Admitting: Pharmacist

## 2022-09-23 VITALS — BP 146/88

## 2022-09-23 DIAGNOSIS — I1A0 Resistant hypertension: Secondary | ICD-10-CM

## 2022-09-23 DIAGNOSIS — I119 Hypertensive heart disease without heart failure: Secondary | ICD-10-CM

## 2022-09-23 MED ORDER — OLMESARTAN MEDOXOMIL 20 MG PO TABS: 20.0000 mg | ORAL_TABLET | Freq: Every day | ORAL | 6 refills | Status: DC

## 2022-09-23 NOTE — Patient Instructions (Addendum)
Your blood pressure goal is < 130/80mmHg   Start taking olmesartan 250mg  daily Start taking carvedilol twice a day Continue amlodipine 10mg  daily, spironolactone 25mg  daily and hydrochlorothiazide 25mg  daily  Make sure you rest 5 min after putting on your cuff before you hit the start button on your blood pressure machine  Important lifestyle changes to control high blood pressure  Intervention  Effect on the BP   Weight loss Weight loss is one of the most effective lifestyle changes for controlling blood pressure. If you're overweight or obese, losing even a small amount of weight can help reduce blood pressure.    Blood pressure can decrease by 1 millimeter of mercury (mmHg) with each kilogram (about 2.2 pounds) of weight lost.   Exercise regularly As a general goal, aim for 30 minutes of moderate physical activity every day.    Regular physical activity can lower blood pressure by 5 - 8 mmHg.   Eat a healthy diet Eat a diet rich in whole grains, fruits, vegetables, lean meat, and low-fat dairy products. Limit processed foods, saturated fat, and sweets.    A heart-healthy diet can lower high blood pressure by 10 mmHg.   Reduce salt (sodium) in your diet Aim for 000mg  of sodium each day. Avoid deli meats, canned food, and frozen microwave meals which are high in sodium.     Limiting sodium can reduce blood pressure by 5 mmHg.   Limit alcohol One drink equals 12 ounces of beer, 5 ounces of wine, or 1.5 ounces of 80-proof liquor.    Limiting alcohol to < 1 drink a day for women or < 2 drinks a day for men can help lower blood pressure by about 4 mmHg.   To check your pressure at home you will need to:   Sit up in a chair, with feet flat on the floor and back supported. Do not cross your ankles or legs. Rest your left arm so that the cuff is about heart level. If the cuff goes on your upper arm, then just relax your arm on the table, arm of the chair, or your lap. If you  have a wrist cuff, hold your wrist against your chest at heart level. Place the cuff snugly around your arm, about 1 inch above the crease of your elbow. The cords should be inside the groove of your elbow.  Sit quietly, with the cuff in place, for about 5 minutes. Then press the power button to start a reading. Do not talk or move while the reading is taking place.  Record your readings on a sheet of paper. Although most cuffs have a memory, it is often easier to see a pattern developing when the numbers are all in front of you.  You can repeat the reading after 1-3 minutes if it is recommended.   Make sure your bladder is empty and you have not had caffeine or tobacco within the last 30 minutes   Always bring your blood pressure log with you to your appointments. If you have not brought your monitor in to be double checked for accuracy, please bring it to your next appointment.   You can find a list of validated (accurate) blood pressure cuffs at: validatebp.org

## 2022-09-23 NOTE — Progress Notes (Signed)
Patient ID: AYA KLINKHAMMER                 DOB: [birth year]                      MRN: [id number]      HPI: PASHENCE SEBOLD is a 67 y.o. female referred by Dr. Allyson Sabal to HTN clinic. PMH is significant for CAD, HTN, and hx of CABG.  Takes carbamazepine for tinnitus.  Patient last seen in HTN clinic 08/23/22. She only provided 3 home readings 150-160 /80 range. Had planned to check renal function and resume ARB, but labs were not optimal. Olmesartan was eventually added by Callie on 8/29. Her metoprolol was also changed to carvedilol.   Patient presents today to clinic.  She appears a little confused about her medications.  She states that she does use a pillbox and that she does not miss any doses except for occasionally her midday medication which is ezetimibe and Xyzal.  She reports taking carvedilol only once a day.  It is written once a day and her medication book as well.  Appears she has been taking amlodipine 2 tablets a day.  Unsure if dose of the 10 mg or 5 mg.  I have reiterated with her that she is only to take a total of 10 mg.  She brings in her home blood pressure cuff which was found to be accurate.  She also brings in a list of readings which most of are well above goal.  She is not resting prior to checking and has a few stairs to climb before she gets to her desk where her blood pressure cuff is.  She is using her right arm to check.  Has an [employer] blood pressure cuff. She reports she did not start the olmesartan due to concerns over her kidney function.  I asked if there was a reason she got dehydrated prior to admit her admission in January.  Patient said she could not remember anything however hospital notes state that she was having diarrhea which caused her dehydration and caused her creatinine to increase.  We reviewed that her kidney function is essentially back to baseline.  We are extremely limited in blood pressure medication options.  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  159/93 Home cuff 157/98 home cuff 146/88 clinic cuff 146/92 on cuff  Current HTN meds:  Amlodipine 10mg  mg daily in the morning Carvedilol 6.25mg  twice a day (only taking in AM) Spironolctone 25mg  daily in the morning HCTZ 25mg  daily Olmesartan 20mg  daily (not taking) Intolerances: Hydralazine (nausea) hydrochlorothiazide (hyponatremia-although patient is on and sodium has been normal)  Exercise: none   Previously tried:  Lisinopril Telmisartan/HCTZ   BP goal: <130/80  Home BP readings: 166 93  134 49  163 102  141 92  144 88  169 104  140 90  152 89  162 96  141 87  HR 50-60's   Wt Readings from Last 3 Encounters:  09/06/22 120 lb 12.8 oz (54.8 kg)  09/02/22 120 lb 12.8 oz (54.8 kg)  08/23/22 120 lb (54.4 kg)   BP Readings from Last 3 Encounters:  09/23/22 (!) 146/88  09/06/22 (!) 152/80  09/02/22 (!) 173/86   Pulse Readings from Last 3 Encounters:  09/06/22 (!) 56  09/02/22 (!) 55  08/23/22 (!) 51    Renal function: CrCl cannot be calculated (Unknown ideal weight.).  Past Medical History:  Diagnosis Date   Anemia    with prev unremarkable heme eval ~2011   CAD (coronary artery disease)    Dr. Donnie Aho with Cards   History of gastric ulcer    2005    Hyperlipidemia    Hypertension    IBS (irritable bowel syndrome)    Leukopenia    with prev unremarkable heme eval ~2011   Migraine    Unsp w/o intract w/o status migrainosus   Osteoporosis 11/18/2004   Shingles    Trigeminal neuralgia 03/05/2012    Current Outpatient Medications on File Prior to Visit  Medication Sig Dispense Refill   amLODipine (NORVASC) 10 MG tablet Take 1 tablet (10 mg total) by mouth daily. 90 tablet 3   aspirin EC 81 MG tablet Take 1 tablet (81 mg total) by mouth daily. Swallow whole.     butalbital-acetaminophen-caffeine (FIORICET) 50-325-40 MG tablet Take 1 tablet by mouth every 4 (four) hours as needed for headache. 20 tablet 1   carbamazepine  (TEGRETOL) 200 MG tablet TAKE 2 TABLETS IN THE MORNING AND 2 TABLETS AT NIGHT     carvedilol (COREG) 6.25 MG tablet Take 1 tablet (6.25 mg total) by mouth 2 (two) times daily. 180 tablet 3   dexlansoprazole (DEXILANT) 60 MG capsule Take 60 mg by mouth daily.     ezetimibe (ZETIA) 10 MG tablet Take 1 tablet (10 mg total) by mouth daily. 90 tablet 3   gabapentin (NEURONTIN) 100 MG capsule Take 100 mg by mouth daily as needed.     hydrochlorothiazide (HYDRODIURIL) 25 MG tablet Take 1 tablet (25 mg total) by mouth daily. 90 tablet 3   levocetirizine (XYZAL) 5 MG tablet TAKE 1 TABLET (5 MG TOTAL) BY MOUTH DAILY. 90 tablet 4   rosuvastatin (CRESTOR) 40 MG tablet TAKE 1 TABLET BY MOUTH EVERY DAY 90 tablet 3   spironolactone (ALDACTONE) 25 MG tablet Take 1 tablet (25 mg total) by mouth daily. 90 tablet 3   traZODone (DESYREL) 50 MG tablet TAKE 1 TABLET BY MOUTH EVERYDAY AT BEDTIME 90 tablet 1   valACYclovir (VALTREX) 500 MG tablet Take 500 mg by mouth as needed.     No current facility-administered medications on file prior to visit.    Allergies  Allergen Reactions   Gabapentin     Other Reaction(s): Confusion (intolerance), Delusions (intolerance)   Amoxicillin-Pot Clavulanate     REACTION: Rash on tongue   Azithromycin Other (See Comments)    diarrhea   Codeine Nausea Only   Delsym [Dextromethorphan]     GI upset.   Doxycycline     GI intolerance   Hydralazine     Nausea    Hydrochlorothiazide     Hyponatremia at 12.5mg  dose   Ibandronate Sodium     REACTION: GI side effects   Iohexol      Desc: HIVES,NASAL CONGESTION DURING A CARDIAC CATH. REQUIRES PRE-MEDS.    Raloxifene     REACTION: GI upset   Sulfonamide Derivatives     REACTION: itching   Venofer [Iron Sucrose] Other (See Comments) and Hypertension    Pt had rxn to Venofer see Hypersensitivity note from 05/24/2022.   Zoledronic Acid     REACTION: unable to take this as she was intolerant of pre-tx calcium and vitamin D    Iodinated Contrast Media Rash    Assessment/Plan:  1. Hypertension -  Resistant hypertension Assessment: Blood pressure above goal in clinic Blood pressure in clinic better than home readings in which she is  not resting prior to checking We are extremely limited in blood pressure medication options.  Hydralazine caused nausea and given her fall history really do not want to use clonidine or an alpha-blocker.   Heart rate in the 50s to 60s therefore no room to titrate beta-blocker We reviewed that her serum creatinine is back to about baseline.  We also reviewed that it is normal to see a small increase in serum creatinine when starting an ARB due to the way that it works.  Anything less than 30% is normal and okay We will need to monitor serum creatinine and potassium very closely There is concern about patient being on 2 diuretics given her history of IBS with diarrhea however patient's blood pressure is also dangerously high and we are have limited options.  Continue to encourage her to stay hydrated and monitor labs closely  Plan: Start taking carvedilol twice a day Start olmesartan 20 mg daily.  I [facility] as patient states that [facility] told [facility] Continue amlodipine 10 mg daily, carvedilol 6.25 mg twice a day, spironolactone 25 mg daily and hydrochlorothiazide 25 mg daily Follow-up in 10 days we will get BMP at this visit Continue checking blood pressure at home   Thank you  Olene Floss, Pharm.D, BCACP, BCPS, CPP [facility] A Division of  [facility] [address] [place], [place] [address]  Phone: [phone]; Fax: [phone]

## 2022-09-23 NOTE — Assessment & Plan Note (Signed)
Assessment: Blood pressure above goal in clinic Blood pressure in clinic better than home readings in which she is not resting prior to checking We are extremely limited in blood pressure medication options.  Hydralazine caused nausea and given her fall history really do not want to use clonidine or an alpha-blocker.   Heart rate in the 50s to 60s therefore no room to titrate beta-blocker We reviewed that her serum creatinine is back to about baseline.  We also reviewed that it is normal to see a small increase in serum creatinine when starting an ARB due to the way that it works.  Anything less than 30% is normal and okay We will need to monitor serum creatinine and potassium very closely There is concern about patient being on 2 diuretics given her history of IBS with diarrhea however patient's blood pressure is also dangerously high and we are have limited options.  Continue to encourage her to stay hydrated and monitor labs closely  Plan: Start taking carvedilol twice a day Start olmesartan 20 mg daily.  I have [facility] as patient states that she told [facility] Continue amlodipine 10 mg daily, carvedilol 6.25 mg twice a day, spironolactone 25 mg daily and hydrochlorothiazide 25 mg daily Follow-up in 10 days we will get BMP at this visit Continue checking blood pressure at home

## 2022-10-03 ENCOUNTER — Ambulatory Visit: Payer: Medicare Other | Attending: Cardiology | Admitting: Pharmacist

## 2022-10-03 VITALS — BP 138/70 | HR 96

## 2022-10-03 DIAGNOSIS — I10 Essential (primary) hypertension: Secondary | ICD-10-CM | POA: Diagnosis not present

## 2022-10-03 NOTE — Progress Notes (Signed)
Patient ID: PENNELOPE FORAND                 DOB: 1955-04-12                      MRN: [id number]      HPI: Karen Saunders is a 67 y.o. female referred by Dr. Allyson Sabal to HTN clinic. PMH is significant for CAD, HTN, and hx of CABG.  Takes carbamazepine for tinnitus.  Patient last seen in HTN clinic 08/23/22. She only provided 3 home readings 150-160 /80 range. Had planned to check renal function and resume ARB, but labs were not optimal. Olmesartan was eventually added by Callie on 8/29. Her metoprolol was also changed to carvedilol.   Last seen 09/23/22.  At that visit she reported only taking carvedilol once a day.  She also reported that she did not resume her olmesartan out of fear for her kidneys.  We discussed the reason she had a decline in her kidney function back in January was due to dehydration from diarrhea.  Discussed the limited blood pressure options we have for her and asked patient to resume olmesartan.  Patient presents today for follow-up.  She reports she is taking her carvedilol twice a day and has resumed her olmesartan.  She brings in 5 blood pressure readings, 1 of them at goal.  Average 137/83.  Denies any dizziness or lightheadedness, headaches or blurred vision.  She reports that she does walk when she goes to her mom's house in [place] 2-3 times a week but does not feel comfortable walking in her neighborhood.  Her home cuff had previously been found to be an accurate.  She reports resting more prior to checking.  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  Current HTN meds:  Amlodipine 10mg  mg daily in the morning Carvedilol 6.25mg  twice a day  Spironolctone 25mg  daily in the morning HCTZ 25mg  daily Olmesartan 20mg  daily  Intolerances: Hydralazine (nausea) hydrochlorothiazide (hyponatremia-although patient is on and sodium has been normal)  Exercise: walks 2-3 times a week to moms and walks [place]  Previously tried:   Lisinopril Telmisartan/HCTZ   BP goal: <130/80  Home BP readings: 142 81  124 78  143 96  144 81  135 80  137.6 83.2   HR 50-60's   Wt Readings from Last 3 Encounters:  09/06/22 120 lb 12.8 oz (54.8 kg)  09/02/22 120 lb 12.8 oz (54.8 kg)  08/23/22 120 lb (54.4 kg)   BP Readings from Last 3 Encounters:  10/03/22 138/70  09/23/22 (!) 146/88  09/06/22 (!) 152/80   Pulse Readings from Last 3 Encounters:  10/03/22 96  09/06/22 (!) 56  09/02/22 (!) 55    Renal function: CrCl cannot be calculated (Patient's most recent lab result is older than the maximum 21 days allowed.).  Past Medical History:  Diagnosis Date   Anemia    with prev unremarkable heme eval ~2011   CAD (coronary artery disease)    Dr. Donnie Aho with Cards   History of gastric ulcer    2005    Hyperlipidemia    Hypertension    IBS (irritable bowel syndrome)    Leukopenia    with prev unremarkable heme eval ~2011   Migraine    Unsp w/o intract w/o status migrainosus   Osteoporosis 11/18/2004   Shingles    Trigeminal neuralgia 03/05/2012    Current Outpatient Medications on  File Prior to Visit  Medication Sig Dispense Refill   amLODipine (NORVASC) 10 MG tablet Take 1 tablet (10 mg total) by mouth daily. 90 tablet 3   aspirin EC 81 MG tablet Take 1 tablet (81 mg total) by mouth daily. Swallow whole.     butalbital-acetaminophen-caffeine (FIORICET) 50-325-40 MG tablet Take 1 tablet by mouth every 4 (four) hours as needed for headache. 20 tablet 1   carbamazepine (TEGRETOL) 200 MG tablet TAKE 2 TABLETS IN THE MORNING AND 2 TABLETS AT NIGHT     carvedilol (COREG) 6.25 MG tablet Take 1 tablet (6.25 mg total) by mouth 2 (two) times daily. 180 tablet 3   dexlansoprazole (DEXILANT) 60 MG capsule Take 60 mg by mouth daily.     ezetimibe (ZETIA) 10 MG tablet Take 1 tablet (10 mg total) by mouth daily. 90 tablet 3   gabapentin (NEURONTIN) 100 MG capsule Take 100 mg by mouth daily as needed.      hydrochlorothiazide (HYDRODIURIL) 25 MG tablet Take 1 tablet (25 mg total) by mouth daily. 90 tablet 3   levocetirizine (XYZAL) 5 MG tablet TAKE 1 TABLET (5 MG TOTAL) BY MOUTH DAILY. 90 tablet 4   olmesartan (BENICAR) 20 MG tablet Take 1 tablet (20 mg total) by mouth daily. 30 tablet 6   rosuvastatin (CRESTOR) 40 MG tablet TAKE 1 TABLET BY MOUTH EVERY DAY 90 tablet 3   spironolactone (ALDACTONE) 25 MG tablet Take 1 tablet (25 mg total) by mouth daily. 90 tablet 3   traZODone (DESYREL) 50 MG tablet TAKE 1 TABLET BY MOUTH EVERYDAY AT BEDTIME 90 tablet 1   valACYclovir (VALTREX) 500 MG tablet Take 500 mg by mouth as needed.     No current facility-administered medications on file prior to visit.    Allergies  Allergen Reactions   Gabapentin     Other Reaction(s): Confusion (intolerance), Delusions (intolerance)   Amoxicillin-Pot Clavulanate     REACTION: Rash on tongue   Azithromycin Other (See Comments)    diarrhea   Codeine Nausea Only   Delsym [Dextromethorphan]     GI upset.   Doxycycline     GI intolerance   Hydralazine     Nausea    Hydrochlorothiazide     Hyponatremia at 12.5mg  dose   Ibandronate Sodium     REACTION: GI side effects   Iohexol      Desc: HIVES,NASAL CONGESTION DURING A CARDIAC CATH. REQUIRES PRE-MEDS.    Raloxifene     REACTION: GI upset   Sulfonamide Derivatives     REACTION: itching   Venofer [Iron Sucrose] Other (See Comments) and Hypertension    Pt had rxn to Venofer see Hypersensitivity note from 05/24/2022.   Zoledronic Acid     REACTION: unable to take this as she was intolerant of pre-tx calcium and vitamin D   Iodinated Contrast Media Rash    Assessment/Plan:  1. Hypertension -  Essential hypertension Assessment: Blood pressure is above goal in clinic today but improved from previous Goal less than 130/80 She resumed olmesartan about 9 days ago Heart rate borderline Need to check BMP today Blood pressure appears to be trending in the  right direction Encouraged her to increase exercise  Plan: Check BMP today Continue amlodipine 10 mg daily, carvedilol 6.25 mg twice daily, spironolactone 25 mg daily, HCTZ 25 mg daily and olmesartan 20 mg daily Follow-up in 3 weeks    Thank you  Olene Floss, Pharm.D, BCACP, BCPS, CPP [facility] Division  of [facility] [address], [place], [place] [address]  Phone: [phone]; Fax: [phone]

## 2022-10-03 NOTE — Assessment & Plan Note (Signed)
Assessment: Blood pressure is above goal in clinic today but improved from previous Goal less than 130/80 She resumed olmesartan about 9 days ago Heart rate borderline Need to check BMP today Blood pressure appears to be trending in the right direction Encouraged her to increase exercise  Plan: Check BMP today Continue amlodipine 10 mg daily, carvedilol 6.25 mg twice daily, spironolactone 25 mg daily, HCTZ 25 mg daily and olmesartan 20 mg daily Follow-up in 3 weeks

## 2022-10-03 NOTE — Patient Instructions (Signed)
Your blood pressure goal is < 130/41mmHg   Continue  Amlodipine 10mg  mg daily in the morning Carvedilol 6.25mg  twice a day  Spironolctone 25mg  daily in the morning HCTZ 25mg  daily Olmesartan 20mg  daily  Continue to drink plenty of water Try to increase your walking  Important lifestyle changes to control high blood pressure  Intervention  Effect on the BP   Weight loss Weight loss is one of the most effective lifestyle changes for controlling blood pressure. If you're overweight or obese, losing even a small amount of weight can help reduce blood pressure.    Blood pressure can decrease by 1 millimeter of mercury (mmHg) with each kilogram (about 2.2 pounds) of weight lost.   Exercise regularly As a general goal, aim for 30 minutes of moderate physical activity every day.    Regular physical activity can lower blood pressure by 5 - 8 mmHg.   Eat a healthy diet Eat a diet rich in whole grains, fruits, vegetables, lean meat, and low-fat dairy products. Limit processed foods, saturated fat, and sweets.    A heart-healthy diet can lower high blood pressure by 10 mmHg.   Reduce salt (sodium) in your diet Aim for 000mg  of sodium each day. Avoid deli meats, canned food, and frozen microwave meals which are high in sodium.     Limiting sodium can reduce blood pressure by 5 mmHg.   Limit alcohol One drink equals 12 ounces of beer, 5 ounces of wine, or 1.5 ounces of 80-proof liquor.    Limiting alcohol to < 1 drink a day for women or < 2 drinks a day for men can help lower blood pressure by about 4 mmHg.   To check your pressure at home you will need to:   Sit up in a chair, with feet flat on the floor and back supported. Do not cross your ankles or legs. Rest your left arm so that the cuff is about heart level. If the cuff goes on your upper arm, then just relax your arm on the table, arm of the chair, or your lap. If you have a wrist cuff, hold your wrist against your chest  at heart level. Place the cuff snugly around your arm, about 1 inch above the crease of your elbow. The cords should be inside the groove of your elbow.  Sit quietly, with the cuff in place, for about 5 minutes. Then press the power button to start a reading. Do not talk or move while the reading is taking place.  Record your readings on a sheet of paper. Although most cuffs have a memory, it is often easier to see a pattern developing when the numbers are all in front of you.  You can repeat the reading after 1-3 minutes if it is recommended.   Make sure your bladder is empty and you have not had caffeine or tobacco within the last 30 minutes   Always bring your blood pressure log with you to your appointments. If you have not brought your monitor in to be double checked for accuracy, please bring it to your next appointment.   You can find a list of validated (accurate) blood pressure cuffs at: validatebp.org

## 2022-10-04 ENCOUNTER — Telehealth: Payer: Self-pay | Admitting: Pharmacist

## 2022-10-04 LAB — BASIC METABOLIC PANEL
BUN/Creatinine Ratio: 20 (ref 12–28)
BUN: 20 mg/dL (ref 8–27)
CO2: 23 mmol/L (ref 20–29)
Calcium: 9 mg/dL (ref 8.7–10.3)
Chloride: 92 mmol/L — ABNORMAL LOW (ref 96–106)
Creatinine, Ser: 1 mg/dL (ref 0.57–1.00)
Glucose: 79 mg/dL (ref 70–99)
Potassium: 4.3 mmol/L (ref 3.5–5.2)
Sodium: 129 mmol/L — ABNORMAL LOW (ref 134–144)
eGFR: 61 mL/min/{1.73_m2} (ref >59)

## 2022-10-04 MED ORDER — OLMESARTAN MEDOXOMIL 40 MG PO TABS: ORAL_TABLET | ORAL | 3 refills | Status: DC

## 2022-10-04 NOTE — Telephone Encounter (Signed)
Patient called back and LVM on my machine. I called her back. I lfet detailed message per DPR on her machine STOP hydrochlorothiazide due to low sodium Increase olmesartan to 40mg  She already has apt with me in a few weeks.

## 2022-10-04 NOTE — Telephone Encounter (Signed)
Scr is at baseline and improved. Na low at 129. Hydrochlorothiazide previously stopped due to hyponatremia. Was resumed in April. Na now low again. Will stop hydrochlorothiazide. She is also on carbamazepine which can cause hyponatremia, but Na normalized while off hydrochlorothiazide and still on carbamazepine.   STOP hydrochlorothiazide Increase olmesartan to 40mg  daily  LVM for pt to call back

## 2022-10-05 NOTE — Telephone Encounter (Signed)
Patient called back. Confirmed/reviewed instructions with patient.

## 2022-10-07 ENCOUNTER — Telehealth: Payer: Self-pay

## 2022-10-07 ENCOUNTER — Inpatient Hospital Stay: Payer: Medicare Other | Attending: Hematology and Oncology

## 2022-10-07 ENCOUNTER — Inpatient Hospital Stay: Payer: Medicare Other

## 2022-10-07 DIAGNOSIS — D539 Nutritional anemia, unspecified: Secondary | ICD-10-CM

## 2022-10-07 DIAGNOSIS — E538 Deficiency of other specified B group vitamins: Secondary | ICD-10-CM | POA: Diagnosis present

## 2022-10-07 MED ORDER — CYANOCOBALAMIN 1000 MCG/ML IJ SOLN
1000.0000 ug | Freq: Once | INTRAMUSCULAR | Status: AC
  Administered 2022-10-07: 1000 ug via INTRAMUSCULAR
  Filled 2022-10-07: qty 1

## 2022-10-07 NOTE — Telephone Encounter (Signed)
Called her regarding injection appt today. Rescheduled injection to 3:15 pm today. She is aware of appt date/time.

## 2022-10-31 ENCOUNTER — Ambulatory Visit: Payer: Medicare Other

## 2022-10-31 ENCOUNTER — Telehealth: Payer: Self-pay | Admitting: Pharmacist

## 2022-10-31 NOTE — Telephone Encounter (Signed)
Called pt bc she missed her pharmd apt this AM. Calling to reschedule.

## 2022-10-31 NOTE — Progress Notes (Deleted)
Patient ID: GENET FISCHL                 DOB: November 18, 1955                      MRN: [id number]      HPI: Karen Saunders is a 67 y.o. female referred by Dr. Allyson Sabal to HTN clinic. PMH is significant for CAD, HTN, and hx of CABG.  Takes carbamazepine for tinnitus.  Patient last seen in HTN clinic 08/23/22. She only provided 3 home readings 150-160 /80 range. Had planned to check renal function and resume ARB, but labs were not optimal. Olmesartan was eventually added by Callie on 8/29. Her metoprolol was also changed to carvedilol.   Last seen 09/23/22.At that visit she reported only taking carvedilol once a day.  She also reported that she did not resume her olmesartan out of fear for her kidneys.  We discussed the reason she had a decline in her kidney function back in January was due to dehydration from diarrhea.  Discussed the limited blood pressure options we have for her and asked patient to resume olmesartan.  At last visit 9/23 BP was 138/70. Hydrochlorothiazide was stopped due to hyponatremia. Olmestartan was increased to 40mg  daily.   Needs bmp Dizziness, lightheadedness, headache, blurred vision, SOB, swelling Home BP? HR?  Patient presents today for follow-up.  She reports she is taking her carvedilol twice a day and has resumed her olmesartan.  She brings in 5 blood pressure readings, 1 of them at goal.  Average 137/83.  Denies any dizziness or lightheadedness, headaches or blurred vision.  She reports that she does walk when she goes to her mom's house in [place] 2-3 times a week but does not feel comfortable walking in her neighborhood.  Her home cuff had previously been found to be an accurate.  She reports resting more prior to checking.  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  Current HTN meds:  Amlodipine 10mg  mg daily in the morning Carvedilol 6.25mg  twice a day  Spironolctone 25mg  daily in the morning Olmesartan 40mg  daily  Intolerances:  Hydralazine (nausea) hydrochlorothiazide (hyponatremia)  Exercise: walks 2-3 times a week to moms and walks [place]  Previously tried:  Lisinopril Telmisartan/HCTZ   BP goal: <130/80  Home BP readings: 142 81  124 78  143 96  144 81  135 80  137.6 83.2   HR 50-60's   Wt Readings from Last 3 Encounters:  09/06/22 120 lb 12.8 oz (54.8 kg)  09/02/22 120 lb 12.8 oz (54.8 kg)  08/23/22 120 lb (54.4 kg)   BP Readings from Last 3 Encounters:  10/03/22 138/70  09/23/22 (!) 146/88  09/06/22 (!) 152/80   Pulse Readings from Last 3 Encounters:  10/03/22 96  09/06/22 (!) 56  09/02/22 (!) 55    Renal function: CrCl cannot be calculated (Patient's most recent lab result is older than the maximum 21 days allowed.).  Past Medical History:  Diagnosis Date   Anemia    with prev unremarkable heme eval ~2011   CAD (coronary artery disease)    Dr. Donnie Aho with Cards   History of gastric ulcer    2005    Hyperlipidemia    Hypertension    IBS (irritable bowel syndrome)    Leukopenia    with prev unremarkable heme eval ~2011   Migraine    Unsp w/o intract w/o status  migrainosus   Osteoporosis 11/18/2004   Shingles    Trigeminal neuralgia 03/05/2012    Current Outpatient Medications on File Prior to Visit  Medication Sig Dispense Refill   amLODipine (NORVASC) 10 MG tablet Take 1 tablet (10 mg total) by mouth daily. 90 tablet 3   aspirin EC 81 MG tablet Take 1 tablet (81 mg total) by mouth daily. Swallow whole.     butalbital-acetaminophen-caffeine (FIORICET) 50-325-40 MG tablet Take 1 tablet by mouth every 4 (four) hours as needed for headache. 20 tablet 1   carbamazepine (TEGRETOL) 200 MG tablet TAKE 2 TABLETS IN THE MORNING AND 2 TABLETS AT NIGHT     carvedilol (COREG) 6.25 MG tablet Take 1 tablet (6.25 mg total) by mouth 2 (two) times daily. 180 tablet 3   dexlansoprazole (DEXILANT) 60 MG capsule Take 60 mg by mouth daily.     ezetimibe (ZETIA) 10 MG  tablet Take 1 tablet (10 mg total) by mouth daily. 90 tablet 3   gabapentin (NEURONTIN) 100 MG capsule Take 100 mg by mouth daily as needed.     levocetirizine (XYZAL) 5 MG tablet TAKE 1 TABLET (5 MG TOTAL) BY MOUTH DAILY. 90 tablet 4   olmesartan (BENICAR) 40 MG tablet Please discontinue hydrochlorothiazide 90 tablet 3   rosuvastatin (CRESTOR) 40 MG tablet TAKE 1 TABLET BY MOUTH EVERY DAY 90 tablet 3   spironolactone (ALDACTONE) 25 MG tablet Take 1 tablet (25 mg total) by mouth daily. 90 tablet 3   traZODone (DESYREL) 50 MG tablet TAKE 1 TABLET BY MOUTH EVERYDAY AT BEDTIME 90 tablet 1   valACYclovir (VALTREX) 500 MG tablet Take 500 mg by mouth as needed.     No current facility-administered medications on file prior to visit.    Allergies  Allergen Reactions   Gabapentin     Other Reaction(s): Confusion (intolerance), Delusions (intolerance)   Amoxicillin-Pot Clavulanate     REACTION: Rash on tongue   Azithromycin Other (See Comments)    diarrhea   Codeine Nausea Only   Delsym [Dextromethorphan]     GI upset.   Doxycycline     GI intolerance   Hydralazine     Nausea    Hydrochlorothiazide     Hyponatremia at 12.5mg  dose   Ibandronate Sodium     REACTION: GI side effects   Iohexol      Desc: HIVES,NASAL CONGESTION DURING A CARDIAC CATH. REQUIRES PRE-MEDS.    Raloxifene     REACTION: GI upset   Sulfonamide Derivatives     REACTION: itching   Venofer [Iron Sucrose] Other (See Comments) and Hypertension    Pt had rxn to Venofer see Hypersensitivity note from 05/24/2022.   Zoledronic Acid     REACTION: unable to take this as she was intolerant of pre-tx calcium and vitamin D   Iodinated Contrast Media Rash    Assessment/Plan:  1. Hypertension -  No problem-specific Assessment & Plan notes found for this encounter.     Thank you  Olene Floss, Pharm.D, BCACP, BCPS, CPP [facility] of [facility] [address]  [place], [place] [address]  Phone: [phone]; Fax: [phone]

## 2022-11-03 NOTE — Telephone Encounter (Signed)
Patient called back and was rescheduled for 11/7

## 2022-11-04 ENCOUNTER — Telehealth: Payer: Self-pay | Admitting: Hematology and Oncology

## 2022-11-04 NOTE — Telephone Encounter (Signed)
Patient is aware of rescheduled appointment times/dates 

## 2022-11-09 ENCOUNTER — Ambulatory Visit: Payer: Medicare Other

## 2022-11-10 ENCOUNTER — Inpatient Hospital Stay: Payer: Medicare Other | Attending: Hematology and Oncology

## 2022-11-10 VITALS — BP 158/51 | HR 55 | Temp 98.2°F | Resp 18

## 2022-11-10 DIAGNOSIS — E538 Deficiency of other specified B group vitamins: Secondary | ICD-10-CM | POA: Diagnosis present

## 2022-11-10 DIAGNOSIS — D539 Nutritional anemia, unspecified: Secondary | ICD-10-CM

## 2022-11-10 MED ORDER — CYANOCOBALAMIN 1000 MCG/ML IJ SOLN
1000.0000 ug | Freq: Once | INTRAMUSCULAR | Status: AC
  Administered 2022-11-10: 1000 ug via INTRAMUSCULAR
  Filled 2022-11-10: qty 1

## 2022-11-10 NOTE — Patient Instructions (Signed)
 Vitamin B12 Injection What is this medication? Vitamin B12 (VAHY tuh min B12) prevents and treats low vitamin B12 levels in your body. It is used in people who do not get enough vitamin B12 from their diet or when their digestive tract does not absorb enough. Vitamin B12 plays an important role in maintaining the health of your nervous system and red blood cells. This medicine may be used for other purposes; ask your health care provider or pharmacist if you have questions. COMMON BRAND NAME(S): B-12 Compliance Kit, B-12 Injection Kit, Cyomin, Dodex, LA-12, Nutri-Twelve, Physicians EZ Use B-12, Primabalt, Vitamin Deficiency Injectable System - B12 What should I tell my care team before I take this medication? They need to know if you have any of these conditions: Kidney disease Leber's disease Megaloblastic anemia An unusual or allergic reaction to cyanocobalamin, cobalt, other medications, foods, dyes, or preservatives Pregnant or trying to get pregnant Breast-feeding How should I use this medication? This medication is injected into a muscle or deeply under the skin. It is usually given in a clinic or care team's office. However, your care team may teach you how to inject yourself. Follow all instructions. Talk to your care team about the use of this medication in children. Special care may be needed. Overdosage: If you think you have taken too much of this medicine contact a poison control center or emergency room at once. NOTE: This medicine is only for you. Do not share this medicine with others. What if I miss a dose? If you are given your dose at a clinic or care team's office, call to reschedule your appointment. If you give your own injections, and you miss a dose, take it as soon as you can. If it is almost time for your next dose, take only that dose. Do not take double or extra doses. What may interact with this medication? Alcohol Colchicine This list may not describe all possible  interactions. Give your health care provider a list of all the medicines, herbs, non-prescription drugs, or dietary supplements you use. Also tell them if you smoke, drink alcohol, or use illegal drugs. Some items may interact with your medicine. What should I watch for while using this medication? Visit your care team regularly. You may need blood work done while you are taking this medication. You may need to follow a special diet. Talk to your care team. Limit your alcohol intake and avoid smoking to get the best benefit. What side effects may I notice from receiving this medication? Side effects that you should report to your care team as soon as possible: Allergic reactions--skin rash, itching, hives, swelling of the face, lips, tongue, or throat Swelling of the ankles, hands, or feet Trouble breathing Side effects that usually do not require medical attention (report to your care team if they continue or are bothersome): Diarrhea This list may not describe all possible side effects. Call your doctor for medical advice about side effects. You may report side effects to FDA at 1-800-FDA-1088. Where should I keep my medication? Keep out of the reach of children. Store at room temperature between 15 and 30 degrees C (59 and 85 degrees F). Protect from light. Throw away any unused medication after the expiration date. NOTE: This sheet is a summary. It may not cover all possible information. If you have questions about this medicine, talk to your doctor, pharmacist, or health care provider.  2024 Elsevier/Gold Standard (2020-09-08 00:00:00)

## 2022-11-16 NOTE — Progress Notes (Signed)
Patient ID: Karen Saunders                 DOB: 1955/07/03                      MRN: [id number]      HPI: Karen Saunders is a 67 y.o. female referred by Dr. Allyson Sabal to HTN clinic. PMH is significant for CAD, HTN, and hx of CABG.  Takes carbamazepine for tinnitus.  Patient was seen in HTN clinic 08/23/22. She only provided 3 home readings 150-160 /80 range. Had planned to check renal function and resume ARB, but labs were not optimal. Olmesartan was eventually added by Callie on 8/29. Her metoprolol was also changed to carvedilol.    Patient was seen on 09/23/22.  At that visit she reported only taking carvedilol once a day.  She also reported that she did not resume her olmesartan out of fear for her kidneys.  We discussed the reason she had a decline in her kidney function back in January was due to dehydration from diarrhea.  Discussed the limited blood pressure options we have for her and asked patient to resume olmesartan.   Patient was last seen on 10/03/22.  She reported she is taking her carvedilol twice a day and has resumed her olmesartan.  She brought in BP readings with 1/5 at goal.  Average 137/83.  Denied any dizziness or lightheadedness, headaches or blurred vision.  She reported that she does walk when she goes to her mom's house in [place] 2-3 times a week but does not feel comfortable walking alone.  Her home cuff had previously been found to be accurate. She reports resting more prior to checking.  Patient presents to clinic for follow-up. Patients BP in clinic today was 158/82 and 160/84 with HR of 77. Patient brought some home readings that average 143.3/86.7 with HR in the 50's-60's. Patient states that her best BP is when she takes it at night while relaxing and listening to jazz music. She states her highest BP is usually in the mornings around breakfast time. Educated patient on sodium intake and increasing exercise. We dicussed her current diet that includes processed meats,  snacks, and eating out and the importance of limiting processed foods. She still walks with her mother and says it is at a good pace. She is going to check out the gym at her church and going to try to start waklking there. She does has some stress in her life. Patient is not experiencing any SOB, swelling, headache, blurred vision, dizziness, or light headedness. Discussed that a goal BP <140/90 would considered in the future and is ok if medication does not continue to work. Discussed with patient potential drug options of retrying hydralazine vs increasing spironolactone. Patient is getting labs after visit, which will determine if the spironolactone can be increased.    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  Current HTN meds:  Amlodipine 10mg  mg daily in the morning Carvedilol 6.25mg  twice a day  Spironolctone 25mg  daily in the morning Olmesartan 40mg  daily Intolerances: Hydralazine (nausea) hydrochlorothiazide (hyponatremia) Tried medications: valsartan-hctz, telmisartan-hctz, lisinopril  Exercise: walks 2-3 times a week for 20-30 minutes at her moms [place] Will check out the gym at her church and try to start walking more there.   Diet: Eats out everyday except Sundays (cooks with sister). She frequents [employer] and [employer]. She eats deli  meat, bacon, and other processed foods. She replaces her fries with some sort of vegetable. For breakfast, she cooks when she does not have to work which typically includes bacon, eggs, toast, or grits. She also likes sweets. She does not drink any coffee and limits soda intake and only drinks ginger ale or 7-up.   BP goal: <130/80  Home BP readings: HR 50's-60's 124 75  147 90  136 80  161 92  151 91  133 85  138 87  150 97  136 86  129 73  144 84  159 97  155  90  143.3  86.7     Wt Readings from Last 3 Encounters:  09/06/22 120 lb 12.8 oz (54.8 kg)  09/02/22 120 lb 12.8 oz  (54.8 kg)  08/23/22 120 lb (54.4 kg)   BP Readings from Last 3 Encounters:  11/10/22 (!) 158/51  10/03/22 138/70  09/23/22 (!) 146/88   Pulse Readings from Last 3 Encounters:  11/10/22 (!) 55  10/03/22 96  09/06/22 (!) 56    Renal function: CrCl cannot be calculated (Patient's most recent lab result is older than the maximum 21 days allowed.).  Past Medical History:  Diagnosis Date   Anemia    with prev unremarkable heme eval ~2011   CAD (coronary artery disease)    Dr. Donnie Aho with Cards   History of gastric ulcer    2005    Hyperlipidemia    Hypertension    IBS (irritable bowel syndrome)    Leukopenia    with prev unremarkable heme eval ~2011   Migraine    Unsp w/o intract w/o status migrainosus   Osteoporosis 11/18/2004   Shingles    Trigeminal neuralgia 03/05/2012    Current Outpatient Medications on File Prior to Visit  Medication Sig Dispense Refill   amLODipine (NORVASC) 10 MG tablet Take 1 tablet (10 mg total) by mouth daily. 90 tablet 3   aspirin EC 81 MG tablet Take 1 tablet (81 mg total) by mouth daily. Swallow whole.     butalbital-acetaminophen-caffeine (FIORICET) 50-325-40 MG tablet Take 1 tablet by mouth every 4 (four) hours as needed for headache. 20 tablet 1   carbamazepine (TEGRETOL) 200 MG tablet TAKE 2 TABLETS IN THE MORNING AND 2 TABLETS AT NIGHT     carvedilol (COREG) 6.25 MG tablet Take 1 tablet (6.25 mg total) by mouth 2 (two) times daily. 180 tablet 3   dexlansoprazole (DEXILANT) 60 MG capsule Take 60 mg by mouth daily.     ezetimibe (ZETIA) 10 MG tablet Take 1 tablet (10 mg total) by mouth daily. 90 tablet 3   gabapentin (NEURONTIN) 100 MG capsule Take 100 mg by mouth daily as needed.     levocetirizine (XYZAL) 5 MG tablet TAKE 1 TABLET (5 MG TOTAL) BY MOUTH DAILY. 90 tablet 4   olmesartan (BENICAR) 40 MG tablet Please discontinue hydrochlorothiazide 90 tablet 3   rosuvastatin (CRESTOR) 40 MG tablet TAKE 1 TABLET BY MOUTH EVERY DAY 90 tablet 3    spironolactone (ALDACTONE) 25 MG tablet Take 1 tablet (25 mg total) by mouth daily. 90 tablet 3   traZODone (DESYREL) 50 MG tablet TAKE 1 TABLET BY MOUTH EVERYDAY AT BEDTIME 90 tablet 1   valACYclovir (VALTREX) 500 MG tablet Take 500 mg by mouth as needed.     No current facility-administered medications on file prior to visit.    Allergies  Allergen Reactions   Gabapentin     Other Reaction(s): Confusion (intolerance), Delusions (  intolerance)   Amoxicillin-Pot Clavulanate     REACTION: Rash on tongue   Azithromycin Other (See Comments)    diarrhea   Codeine Nausea Only   Delsym [Dextromethorphan]     GI upset.   Doxycycline     GI intolerance   Hydralazine     Nausea    Hydrochlorothiazide     Hyponatremia at 12.5mg  dose   Ibandronate Sodium     REACTION: GI side effects   Iohexol      Desc: HIVES,NASAL CONGESTION DURING A CARDIAC CATH. REQUIRES PRE-MEDS.    Raloxifene     REACTION: GI upset   Sulfonamide Derivatives     REACTION: itching   Venofer [Iron Sucrose] Other (See Comments) and Hypertension    Pt had rxn to Venofer see Hypersensitivity note from 05/24/2022.   Zoledronic Acid     REACTION: unable to take this as she was intolerant of pre-tx calcium and vitamin D   Iodinated Contrast Media Rash    Assessment/Plan:  1. Hypertension -  Assessment: Blood pressure is above goal (<130/80) in clinic today Patient states good adherence to BP medications.  She eats out a lot and eats processed foods such as deli meat. Discussed how these can be very high in sodium. Discussed reducing the amount of processed foods in her diet and the importance behind it. Encouraged patient to continue walking with her mother and discussed that she should feel her HR increase while exercising. Encouraged her to increase how often she goes for walks Patient reported nausea with hydralazine  BMP on 10/03/22 showed hyponatremia (129) and HCTZ was stopped.  Patient needs BMP today    Plan: Check BMP today If K is WNL; consider increasing spironolactone  For now, continue amlodipine 10 mg daily, carvedilol 6.25 mg twice daily, spironolactone 25 mg daily, and olmesartan 40 mg daily Follow-up in 2-3 weeks    Thank you  Minette Brine, Student Pharmacist  Olene Floss, Pharm.D, BCACP, BCPS, CPP [facility] Division of [facility] [address] [place], [place] [address]  Phone: [phone]; Fax: [phone]

## 2022-11-17 ENCOUNTER — Ambulatory Visit: Payer: Medicare Other | Attending: Cardiovascular Disease | Admitting: Pharmacist

## 2022-11-17 VITALS — BP 160/84

## 2022-11-17 DIAGNOSIS — I119 Hypertensive heart disease without heart failure: Secondary | ICD-10-CM

## 2022-11-17 NOTE — Patient Instructions (Signed)
Your blood pressure goal is < 130/50mmHg   Please try to increase your exercise! We will call you with your lab results and go over any medication changes Be mindful of sodium intake. We want to limit to no more than 2000mg  of sodium per day  Important lifestyle changes to control high blood pressure  Intervention  Effect on the BP   Weight loss Weight loss is one of the most effective lifestyle changes for controlling blood pressure. If you're overweight or obese, losing even a small amount of weight can help reduce blood pressure.    Blood pressure can decrease by 1 millimeter of mercury (mmHg) with each kilogram (about 2.2 pounds) of weight lost.   Exercise regularly As a general goal, aim for 30 minutes of moderate physical activity every day.    Regular physical activity can lower blood pressure by 5 - 8 mmHg.   Eat a healthy diet Eat a diet rich in whole grains, fruits, vegetables, lean meat, and low-fat dairy products. Limit processed foods, saturated fat, and sweets.    A heart-healthy diet can lower high blood pressure by 10 mmHg.   Reduce salt (sodium) in your diet Aim for 000mg  of sodium each day. Avoid deli meats, canned food, and frozen microwave meals which are high in sodium.     Limiting sodium can reduce blood pressure by 5 mmHg.   Limit alcohol One drink equals 12 ounces of beer, 5 ounces of wine, or 1.5 ounces of 80-proof liquor.    Limiting alcohol to < 1 drink a day for women or < 2 drinks a day for men can help lower blood pressure by about 4 mmHg.   To check your pressure at home you will need to:   Sit up in a chair, with feet flat on the floor and back supported. Do not cross your ankles or legs. Rest your left arm so that the cuff is about heart level. If the cuff goes on your upper arm, then just relax your arm on the table, arm of the chair, or your lap. If you have a wrist cuff, hold your wrist against your chest at heart level. Place the cuff  snugly around your arm, about 1 inch above the crease of your elbow. The cords should be inside the groove of your elbow.  Sit quietly, with the cuff in place, for about 5 minutes. Then press the power button to start a reading. Do not talk or move while the reading is taking place.  Record your readings on a sheet of paper. Although most cuffs have a memory, it is often easier to see a pattern developing when the numbers are all in front of you.  You can repeat the reading after 1-3 minutes if it is recommended.   Make sure your bladder is empty and you have not had caffeine or tobacco within the last 30 minutes   Always bring your blood pressure log with you to your appointments. If you have not brought your monitor in to be double checked for accuracy, please bring it to your next appointment.   You can find a list of validated (accurate) blood pressure cuffs at: validatebp.org

## 2022-11-18 ENCOUNTER — Encounter: Payer: Self-pay | Admitting: Pharmacist

## 2022-11-18 ENCOUNTER — Other Ambulatory Visit: Payer: Self-pay | Admitting: Pharmacist

## 2022-11-18 LAB — BASIC METABOLIC PANEL
BUN/Creatinine Ratio: 14 (ref 12–28)
BUN: 12 mg/dL (ref 8–27)
CO2: 24 mmol/L (ref 20–29)
Calcium: 9.3 mg/dL (ref 8.7–10.3)
Chloride: 107 mmol/L — ABNORMAL HIGH (ref 96–106)
Creatinine, Ser: 0.85 mg/dL (ref 0.57–1.00)
Glucose: 96 mg/dL (ref 70–99)
Potassium: 3.9 mmol/L (ref 3.5–5.2)
Sodium: 143 mmol/L (ref 134–144)
eGFR: 74 mL/min/{1.73_m2} (ref >59)

## 2022-11-18 MED ORDER — SPIRONOLACTONE 50 MG PO TABS: 50.0000 mg | ORAL_TABLET | Freq: Every day | ORAL | 3 refills | Status: DC

## 2022-11-18 NOTE — Telephone Encounter (Signed)
This encounter was created in error - please disregard.

## 2022-11-21 NOTE — Progress Notes (Signed)
Called patient and left voicemail for her to call back and that there is a msg in her MyChart with instructions for her medication.

## 2022-11-25 MED ORDER — OLMESARTAN MEDOXOMIL 40 MG PO TABS: 40.0000 mg | ORAL_TABLET | Freq: Every day | ORAL | 3 refills | Status: DC

## 2022-12-05 ENCOUNTER — Ambulatory Visit (INDEPENDENT_AMBULATORY_CARE_PROVIDER_SITE_OTHER): Payer: Medicare Other

## 2022-12-05 VITALS — Ht 62.0 in | Wt 120.0 lb

## 2022-12-05 DIAGNOSIS — Z0000 Encounter for general adult medical examination without abnormal findings: Secondary | ICD-10-CM | POA: Diagnosis not present

## 2022-12-05 DIAGNOSIS — Z1231 Encounter for screening mammogram for malignant neoplasm of breast: Secondary | ICD-10-CM

## 2022-12-05 NOTE — Progress Notes (Unsigned)
Patient ID: AQUA UNTHANK                 DOB: 03-30-1955                      MRN: [id number]      HPI: Karen Saunders is a 67 y.o. female referred by Dr. Allyson Sabal to HTN clinic. PMH is significant for CAD, HTN, and hx of CABG.  Takes carbamazepine for tinnitus.  Patient was seen in HTN clinic 08/23/22. She only provided 3 home readings 150-160 /80 range. Had planned to check renal function and resume ARB, but labs were not optimal. Olmesartan was eventually added by Callie on 8/29. Her metoprolol was also changed to carvedilol.    Patient was seen on 09/23/22.  At that visit she reported only taking carvedilol once a day.  She also reported that she did not resume her olmesartan out of fear for her kidneys.  We discussed the reason she had a decline in her kidney function back in January was due to dehydration from diarrhea.  Discussed the limited blood pressure options we have for her and asked patient to resume olmesartan.   Patient was seen on 10/03/22.  She reported she is taking her carvedilol twice a day and has resumed her olmesartan.  She brought in BP readings with 1/5 at goal.  Average 137/83.  Denied any dizziness or lightheadedness, headaches or blurred vision.  She reported that she does walk when she goes to her mom's house in [place] 2-3 times a week but does not feel comfortable walking alone.  Her home cuff had previously been found to be accurate. She reports resting more prior to checking.  Patient was last seen 11/17/22. BP in office was elevated at 158/82 and 160/84 with HR of 77. Patients home BP readings averaged 143/87 with HR in the 50's-60's. Patient stated that her BP is best at night. Educated patient on sodium intake and increasing exercise. We dicussed her current diet that includes processed meats, snacks, and eating out and the importance of limiting processed foods. She walks with her mother at a good pace. She was going to check out the gym at her church to start  walking there. Denied SOB, swelling, headache, blurred vision, dizziness, or light headedness. Discussed with patient potential drug options of retrying hydralazine vs increasing spironolactone. Labs came back ok and spironolactone was increased to 50mg  daily.   Patient presents to clinic today for follow-up. Patients BP in clinic is 158/88 and 156/88 with a HR of 50. Home BP averages 158/97.  Patient denies dizziness, light headedness, SOB, headaches, and swelling. Reports that she has checked out the gym at her church and it has plenty of space for her to walk. Patient is still walking with her mother 2-3 times a week. She reports that she has cut back on eating out and is eating baked chicken at home with rice and a frozen vegetable. Patient is not interested in adding on additional medications. Patient is willing to try lifestyle changes to decrease BP before adding more medications.   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  Current HTN meds:  Amlodipine 10mg  mg daily in the morning Carvedilol 6.25mg  twice a day  Spironolctone 50mg  daily in the morning Olmesartan 40mg  daily Intolerances: Hydralazine (nausea) hydrochlorothiazide (hyponatremia) Tried medications: valsartan-hctz, telmisartan-hctz, lisinopril Goal: <130/80  Exercise: walks 2-3 times a week for 20-30 minutes at her moms [place] Will check out the gym at her church and try to start walking more there.   Diet: Eats out everyday except Sundays (cooks with sister). Eats at [employer] and [employer] less since last visit on 11/07. She said she avoids deli meat now, such as [place] since she knows it is not the best choice. She cooks more often now and cooks baked chicken with rice and a frozen vegetable (carrots, peas, green beans) For breakfast, she cooks when she does not have to work which typically includes bacon, eggs, toast, or grits. She also likes sweets. She does not drink  any coffee and limits soda intake and only drinks ginger ale or 7-up.    Home BP readings: HR 50's 158 100  140 87  142 90  156 93  148 91  148 92  131 88  150 92  158  91.6     Wt Readings from Last 3 Encounters:  12/05/22 120 lb (54.4 kg)  09/06/22 120 lb 12.8 oz (54.8 kg)  09/02/22 120 lb 12.8 oz (54.8 kg)   BP Readings from Last 3 Encounters:  12/06/22 (!) 156/88  11/17/22 (!) 160/84  11/10/22 (!) 158/51   Pulse Readings from Last 3 Encounters:  12/06/22 (!) 50  11/10/22 (!) 55  10/03/22 96    Renal function: Estimated Creatinine Clearance: 49.4 mL/min (by C-G formula based on SCr of 0.85 mg/dL).  Past Medical History:  Diagnosis Date   Anemia    with prev unremarkable heme eval ~2011   CAD (coronary artery disease)    Dr. Donnie Aho with Cards   History of gastric ulcer    2005    Hyperlipidemia    Hypertension    IBS (irritable bowel syndrome)    Leukopenia    with prev unremarkable heme eval ~2011   Migraine    Unsp w/o intract w/o status migrainosus   Osteoporosis 11/18/2004   Shingles    Trigeminal neuralgia 03/05/2012    Current Outpatient Medications on File Prior to Visit  Medication Sig Dispense Refill   amLODipine (NORVASC) 10 MG tablet Take 1 tablet (10 mg total) by mouth daily. 90 tablet 3   aspirin EC 81 MG tablet Take 1 tablet (81 mg total) by mouth daily. Swallow whole.     butalbital-acetaminophen-caffeine (FIORICET) 50-325-40 MG tablet Take 1 tablet by mouth every 4 (four) hours as needed for headache. 20 tablet 1   carbamazepine (TEGRETOL) 200 MG tablet TAKE 2 TABLETS IN THE MORNING AND 2 TABLETS AT NIGHT     carvedilol (COREG) 6.25 MG tablet Take 1 tablet (6.25 mg total) by mouth 2 (two) times daily. 180 tablet 3   dexlansoprazole (DEXILANT) 60 MG capsule Take 60 mg by mouth daily.     ezetimibe (ZETIA) 10 MG tablet Take 1 tablet (10 mg total) by mouth daily. 90 tablet 3   gabapentin (NEURONTIN) 100 MG capsule Take 100 mg by mouth daily  as needed.     levocetirizine (XYZAL) 5 MG tablet TAKE 1 TABLET (5 MG TOTAL) BY MOUTH DAILY. 90 tablet 4   olmesartan (BENICAR) 40 MG tablet Take 1 tablet (40 mg total) by mouth daily. Pharmacy, please d/c rx for olmesartan 20mg  90 tablet 3   rosuvastatin (CRESTOR) 40 MG tablet TAKE 1 TABLET BY MOUTH EVERY DAY 90 tablet 3   spironolactone (ALDACTONE) 50 MG tablet Take 1 tablet (50 mg total) by mouth daily. 90 tablet 3   traZODone (DESYREL)  50 MG tablet TAKE 1 TABLET BY MOUTH EVERYDAY AT BEDTIME 90 tablet 1   valACYclovir (VALTREX) 500 MG tablet Take 500 mg by mouth as needed.     No current facility-administered medications on file prior to visit.    Allergies  Allergen Reactions   Gabapentin     Other Reaction(s): Confusion (intolerance), Delusions (intolerance)   Amoxicillin-Pot Clavulanate     REACTION: Rash on tongue   Azithromycin Other (See Comments)    diarrhea   Codeine Nausea Only   Delsym [Dextromethorphan]     GI upset.   Doxycycline     GI intolerance   Hydralazine     Nausea    Hydrochlorothiazide     Hyponatremia at 12.5mg  dose   Ibandronate Sodium     REACTION: GI side effects   Iohexol      Desc: HIVES,NASAL CONGESTION DURING A CARDIAC CATH. REQUIRES PRE-MEDS.    Raloxifene     REACTION: GI upset   Sulfonamide Derivatives     REACTION: itching   Venofer [Iron Sucrose] Other (See Comments) and Hypertension    Pt had rxn to Venofer see Hypersensitivity note from 05/24/2022.   Zoledronic Acid     REACTION: unable to take this as she was intolerant of pre-tx calcium and vitamin D   Iodinated Contrast Media Rash     Blood pressure (!) 156/88, pulse (!) 50.   Assessment/Plan: HYPERTENSION CONTROL Vitals:   12/06/22 1426 12/06/22 1434  BP: (!) 158/88 (!) 156/88    The patient's blood pressure is elevated above target today.  In order to address the patient's elevated BP:       1. Hypertension -  Essential hypertension Assessment: BP is  uncontrolled and above goal of <130/80.  Patient is not interested in additional medications.  Patient will try to increase her exercise and eat a healthier diet to reduce BP.   Plan: Increase exercise to 150 minutes per week of moderate intensity Decrease the amount of sodium in diet. Decrease eating out Continue Amlodipine 10mg  mg daily in the morning, Carvedilol 6.25mg  twice a day , Spironolactone 50mg  daily in the morning, Olmesartan 40mg  daily Check BMP today Follow up in 1 month    Thank you,  Minette Brine, Student Pharmacist  Olene Floss, Pharm.D, BCACP, BCPS, CPP [facility] A Division of [facility] [address] [place], [place] [address]  Phone: [phone]; Fax: [phone]

## 2022-12-05 NOTE — Progress Notes (Signed)
Subjective:   Karen Saunders is a 67 y.o. female who presents for Medicare Annual (Subsequent) preventive examination.  Visit Complete: Virtual I connected with  Karen Saunders on 12/05/22 by a audio enabled telemedicine application and verified that I am speaking with the correct person using two identifiers.  Patient Location: Home  Provider Location: Home Office  I discussed the limitations of evaluation and management by telemedicine. The patient expressed understanding and agreed to proceed.  Vital Signs: Because this visit was a virtual/telehealth visit, some criteria may be missing or patient reported. Any vitals not documented were not able to be obtained and vitals that have been documented are patient reported.  Patient Medicare AWV questionnaire was completed by the patient on 12/02/22; I have confirmed that all information answered by patient is correct and no changes since this date. Cardiac Risk Factors include: advanced age (>40men, >55 women);dyslipidemia    Objective:    Today's Vitals   12/05/22 0856  Weight: 120 lb (54.4 kg)  Height: 5\' 2"  (1.575 m)   Body mass index is 21.95 kg/m.     12/05/2022    9:03 AM 02/16/2022    1:02 PM 01/18/2022    6:54 PM 01/07/2022    9:28 AM 12/08/2021    9:09 AM 05/12/2016    1:34 PM 02/05/2015   11:44 AM  Advanced Directives  Does Patient Have a Medical Advance Directive? Yes Yes No No Yes No No  Type of Estate agent of [place];Living will Healthcare Power of [place];Living will;Out of facility DNR (pink MOST or yellow form)   Healthcare Power of Attorney    Does patient want to make changes to medical advance directive?     No - Patient declined    Copy of Healthcare Power of Attorney in Chart? No - copy requested    No - copy requested    Would patient like information on creating a medical advance directive?   No - Patient declined No - Patient declined  No - Patient declined     Current Medications  (verified) Outpatient Encounter Medications as of 12/05/2022  Medication Sig   amLODipine (NORVASC) 10 MG tablet Take 1 tablet (10 mg total) by mouth daily.   aspirin EC 81 MG tablet Take 1 tablet (81 mg total) by mouth daily. Swallow whole.   butalbital-acetaminophen-caffeine (FIORICET) 50-325-40 MG tablet Take 1 tablet by mouth every 4 (four) hours as needed for headache.   carbamazepine (TEGRETOL) 200 MG tablet TAKE 2 TABLETS IN THE MORNING AND 2 TABLETS AT NIGHT   carvedilol (COREG) 6.25 MG tablet Take 1 tablet (6.25 mg total) by mouth 2 (two) times daily.   dexlansoprazole (DEXILANT) 60 MG capsule Take 60 mg by mouth daily.   ezetimibe (ZETIA) 10 MG tablet Take 1 tablet (10 mg total) by mouth daily.   gabapentin (NEURONTIN) 100 MG capsule Take 100 mg by mouth daily as needed.   levocetirizine (XYZAL) 5 MG tablet TAKE 1 TABLET (5 MG TOTAL) BY MOUTH DAILY.   olmesartan (BENICAR) 40 MG tablet Take 1 tablet (40 mg total) by mouth daily. Pharmacy, please d/c rx for olmesartan 20mg    rosuvastatin (CRESTOR) 40 MG tablet TAKE 1 TABLET BY MOUTH EVERY DAY   spironolactone (ALDACTONE) 50 MG tablet Take 1 tablet (50 mg total) by mouth daily.   traZODone (DESYREL) 50 MG tablet TAKE 1 TABLET BY MOUTH EVERYDAY AT BEDTIME   valACYclovir (VALTREX) 500 MG tablet Take 500 mg by mouth as  needed.   No facility-administered encounter medications on file as of 12/05/2022.    Allergies (verified) Gabapentin, Amoxicillin-pot clavulanate, Azithromycin, Codeine, Delsym [dextromethorphan], Doxycycline, Hydralazine, Hydrochlorothiazide, Ibandronate sodium, Iohexol, Raloxifene, Sulfonamide derivatives, Venofer [iron sucrose], Zoledronic acid, and Iodinated contrast media   History: Past Medical History:  Diagnosis Date   Anemia    with prev unremarkable heme eval ~2011   CAD (coronary artery disease)    Dr. Donnie Aho with Cards   History of gastric ulcer    2005    Hyperlipidemia    Hypertension    IBS  (irritable bowel syndrome)    Leukopenia    with prev unremarkable heme eval ~2011   Migraine    Unsp w/o intract w/o status migrainosus   Osteoporosis 11/18/2004   Shingles    Trigeminal neuralgia 03/05/2012   Past Surgical History:  Procedure Laterality Date   ABDOMINAL HYSTERECTOMY  2002   CORONARY ARTERY BYPASS GRAFT  2002   LUMBAR SPINE SURGERY     Stress cardiolite  09/11/2008   Probably normal with breast attenuation noted but no evidence of ischemia   TONSILLECTOMY  ~ 1965   Family History  Problem Relation Age of Onset   Arthritis Mother    Diabetes Mother    Hypertension Mother    Dementia Mother    Stroke Father    Heart disease Father        MI   Diabetes Father    Hypertension Father    Colon cancer Neg Hx    Breast cancer Neg Hx    Social History   Socioeconomic History   Marital status: Divorced    Spouse name: Not on file   Number of children: 1   Years of education: Not on file   Highest education level: Not on file  Occupational History   Occupation: MEDICAID SPEC    Employer: [employer]   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     Works at [employer]         Right Handed   Lives in a two story home   Social Determinants of Health   Financial Resource Strain: Low Risk  (12/02/2022)   Overall Financial Resource Strain (CARDIA)    Difficulty of Paying Living Expenses: Not hard at all  Food Insecurity: No Food Insecurity (12/02/2022)    Hunger Vital Sign    Worried About Running Out of Food in the Last Year: Never true    Ran Out of Food in the Last Year: Never true  Transportation Needs: No Transportation Needs (12/02/2022)   PRAPARE - Administrator, Civil Service (Medical): No    Lack of Transportation (Non-Medical): No  Physical Activity: Insufficiently Active (12/02/2022)   Exercise Vital Sign    Days of Exercise per Week: 2 days    Minutes of Exercise per Session: 20 min  Stress: No Stress Concern Present (12/02/2022)   [employer] of Occupational Health - Occupational Stress Questionnaire    Feeling of Stress : Not at all  Social Connections: Moderately Integrated (12/02/2022)   Social Connection and Isolation Panel [NHANES]    Frequency of Communication with Friends and Family: More than three times a week    Frequency of Social Gatherings with Friends and Family: More than three times a week    Attends Religious Services: More than 4 times per year    Active Member of [employer] or Organizations: Yes    Attends Banker Meetings: 1 to 4 times per year    Marital Status: Divorced    Tobacco Counseling Counseling given: Not Answered   Clinical Intake:  Pre-visit preparation completed: Yes  Pain : No/denies pain     BMI - recorded: 21.95 Nutritional Status: BMI of 19-24  Normal Nutritional Risks: None Diabetes: No  How often do you need to have someone help you when you read instructions, pamphlets, or other written materials from your doctor or pharmacy?: 1 - Never  Interpreter Needed?: No  Comments: lives alone Information entered by :: B.Lesley Atkin,LPN   Activities of Daily Living    12/02/2022    9:56 AM 12/08/2021    9:10 AM  In your present state of health, do you have any difficulty performing the following activities:  Hearing? 0 0  Vision? 0 0  Difficulty concentrating or making decisions? 0 0  Walking or climbing stairs? 0 0  Dressing or bathing? 0 0   Doing errands, shopping? 0 0  Preparing Food and eating ? N N  Using the Toilet? N N  In the past six months, have you accidently leaked urine? N N  Do you have problems with loss of bowel control? N N  Managing your Medications? N N  Managing your Finances? N N  Housekeeping or managing your Housekeeping? N N    Patient Care Team: Joaquim Nam, MD as PCP - General Charna Elizabeth, MD as Consulting Physician (Gastroenterology) Othella Boyer, MD as Consulting Physician (Cardiology) Glendale Chard, DO as Consulting Physician (Neurology) Runell Gess, MD as Consulting Physician (Cardiology) Barnie Alderman, MD as Referring Physician (Otolaryngology) [facility]  Indicate any recent Medical Services you may have received from other than [facility] providers in the past year (date may be approximate).     Assessment:   This is a routine wellness examination for [place].  Hearing/Vision screen Hearing Screening - Comments:: Pt says hears fine Vision Screening - Comments:: Pt says her vision is fine with readers only Dr Dione Booze   Goals Addressed               This Visit's Progress     COMPLETED: I would like to gain weight (pt-stated)   On track     Increase physical activity Eat more things like ice cream and protein       Depression Screen    12/05/2022    9:01 AM 12/08/2021    9:08 AM 12/07/2020   10:31 AM 12/14/2017   12:48 PM 11/04/2016    9:44 AM 03/01/2015    8:43 AM 02/08/2015    8:31 AM  PHQ 2/9 Scores  PHQ - 2 Score 0 0 0 0 0 0 0    Fall Risk    12/02/2022    9:56 AM 02/16/2022    1:02 PM 12/08/2021    9:10 AM 12/07/2020  10:31 AM  Fall Risk   Falls in the past year? 1 1 0 0  Number falls in past yr: 0 0 0 0  Injury with Fall? 1 1 0 0  Risk for fall due to : No Fall Risks  No Fall Risks No Fall Risks  Follow up Education provided;Falls prevention discussed Falls evaluation completed Falls evaluation completed Falls  evaluation completed    MEDICARE RISK AT HOME: Medicare Risk at Home Any stairs in or around the home?: Yes If so, are there any without handrails?: No Home free of loose throw rugs in walkways, pet beds, electrical cords, etc?: Yes Adequate lighting in your home to reduce risk of falls?: Yes Life alert?: No Use of a cane, walker or w/c?: No Grab bars in the bathroom?: No Shower chair or bench in shower?: No Elevated toilet seat or a handicapped toilet?: No  TIMED UP AND GO:  Was the test performed?  No    Cognitive Function:        12/05/2022    9:08 AM 12/08/2021    9:10 AM  6CIT Screen  What Year? 0 points 0 points  What month? 0 points 0 points  What time? 0 points 0 points  Count back from 20 0 points 0 points  Months in reverse 0 points 0 points  Repeat phrase 0 points 0 points  Total Score 0 points 0 points    Immunizations Immunization History  Administered Date(s) Administered   Fluad Quad(high Dose 65+) 10/20/2021, 11/09/2022, 11/23/2022   Influenza Whole 10/10/2009, 10/11/2011, 10/17/2012   Influenza,inj,Quad PF,6+ Mos 10/26/2016, 11/09/2019, 11/04/2020   Influenza,inj,Quad PF,6-35 Mos 11/10/2015, 10/24/2017, 10/17/2018   Influenza,inj,quad, With Preservative 11/08/2013, 11/05/2014   Influenza-Unspecified 11/08/2013, 10/29/2014, 11/05/2015, 10/10/2017   PFIZER(Purple Top)SARS-COV-2 Vaccination 02/14/2019, 03/08/2019, 01/28/2020   PNEUMOCOCCAL CONJUGATE-20 12/07/2020   Pneumococcal Polysaccharide-23 11/09/2010   Td 01/11/2008    TDAP status: Up to date  Flu Vaccine status: Up to date  Pneumococcal vaccine status: Up to date  Covid-19 vaccine status: Completed vaccines  Qualifies for Shingles Vaccine? Yes   Zostavax completed No   Shingrix Completed?: No.    Education has been provided regarding the importance of this vaccine. Patient has been advised to call insurance company to determine out of pocket expense if they have not yet received this  vaccine. Advised may also receive vaccine at local pharmacy or Health Dept. Verbalized acceptance and understanding.  Screening Tests Health Maintenance  Topic Date Due   MAMMOGRAM  08/26/2022   COVID-19 Vaccine (4 - 2023-24 season) 12/21/2022 (Originally 09/11/2022)   Zoster Vaccines- Shingrix (1 of 2) 03/07/2023 (Originally 10/18/1974)   DTaP/Tdap/Td (2 - Tdap) 12/05/2023 (Originally 01/10/2018)   Medicare Annual Wellness (AWV)  12/05/2023   Colonoscopy  08/12/2031   Pneumonia Vaccine 7+ Years old  Completed   INFLUENZA VACCINE  Completed   DEXA SCAN  Completed   Hepatitis C Screening  Completed   HPV VACCINES  Aged Out    Health Maintenance  Health Maintenance Due  Topic Date Due   MAMMOGRAM  08/26/2022    Colorectal cancer screening: Type of screening: Colonoscopy. Completed 08/11/2021. Repeat every 10 years  Mammogram status: Completed 08/25/20. Repeat every year  Bone Density status: Completed 05/10/2021. Results reflect: Bone density results: OSTEOPENIA. Repeat every 3 years.  Lung Cancer Screening: (Low Dose CT Chest recommended if Age 68-80 years, 20 pack-year currently smoking OR have quit w/in 15years.) does not qualify.   Lung Cancer Screening Referral: no  Additional Screening:  Hepatitis C Screening: does not qualify; Completed 12/14/2017  Vision Screening: Recommended annual ophthalmology exams for early detection of glaucoma and other disorders of the eye. Is the patient up to date with their annual eye exam?  Yes  Who is the provider or what is the name of the office in which the patient attends annual eye exams? Dr Dione Booze If pt is not established with a provider, would they like to be referred to a provider to establish care? No .   Dental Screening: Recommended annual dental exams for proper oral hygiene  Diabetic Foot Exam: n/a  Community Resource Referral / Chronic Care Management: CRR required this visit?  No   CCM required this visit?  Appt made with  PCP    Plan:     I have personally reviewed and noted the following in the patient's chart:   Medical and social history Use of alcohol, tobacco or illicit drugs  Current medications and supplements including opioid prescriptions. Patient is not currently taking opioid prescriptions. Functional ability and status Nutritional status Physical activity Advanced directives List of other physicians Hospitalizations, surgeries, and ER visits in previous 12 months Vitals Screenings to include cognitive, depression, and falls Referrals and appointments  In addition, I have reviewed and discussed with patient certain preventive protocols, quality metrics, and best practice recommendations. A written personalized care plan for preventive services as well as general preventive health recommendations were provided to patient.     Sue Lush, LPN   [id number]   After Visit Summary: (MyChart) Due to this being a telephonic visit, the after visit summary with patients personalized plan was offered to patient via MyChart   Nurse Notes: The patient states they are doing well and has no concerns or questions at this time.

## 2022-12-05 NOTE — Patient Instructions (Signed)
[name] , Thank you for taking time to come for your Medicare Wellness Visit. I appreciate your ongoing commitment to your health goals. Please review the following plan we discussed and let me know if I can assist you in the future.   Referrals/Orders/Follow-Ups/Clinician Recommendations: appt w/PCP for PE made  You have an order for:  []   2D Mammogram  [x]   3D Mammogram  []   Bone Density     Please call for appointment:  [facility] [address] [place], [place] [address] [phone]  [facility] [address] [place], [place] [address] [phone]  [facility] [address] [place], [place] [address] [phone]  [facility] [address] [place], [place] [address] [phone]  [facility] [address] [place], [place] [address] [phone]    Make sure to wear two-piece clothing.  No lotions, powders, or deodorants the day of the appointment. Make sure to bring picture ID and insurance card.  Bring list of medications you are currently taking including any supplements.   Schedule your [facility] screening mammogram through MyChart!   Log into your MyChart account.  Go to 'Visit' (or 'Appointments' if on mobile App) --> Schedule an Appointment  Under 'Select a Reason for Visit' choose the Mammogram Screening option.  Complete the pre-visit questions and select the time and place that best fits your schedule.    This is a list of the screening recommended for you and due dates:  Health Maintenance  Topic Date Due   Zoster (Shingles) Vaccine (1 of 2) Never done   DTaP/Tdap/Td vaccine (2 - Tdap) 01/10/2018   Flu Shot  08/11/2022   Mammogram  08/26/2022   COVID-19 Vaccine (4 - 2023-24 season) 09/11/2022   Medicare Annual Wellness Visit  12/05/2023   Colon Cancer Screening  08/12/2031   Pneumonia Vaccine   Completed   DEXA scan (bone density measurement)  Completed   Hepatitis C Screening  Completed   HPV Vaccine  Aged Out    Advanced directives: (Copy Requested) Please bring a copy of your health care power of attorney and living will to the office to be added to your chart at your convenience.  Next Medicare Annual Wellness Visit scheduled for next year: Yes 12/06/23 @ 8:10am telephone

## 2022-12-06 ENCOUNTER — Ambulatory Visit: Payer: Medicare Other | Attending: Cardiovascular Disease | Admitting: Pharmacist

## 2022-12-06 VITALS — BP 156/88 | HR 50

## 2022-12-06 DIAGNOSIS — I10 Essential (primary) hypertension: Secondary | ICD-10-CM | POA: Diagnosis not present

## 2022-12-06 NOTE — Patient Instructions (Addendum)
Your blood pressure goal is < 130/64mmHg Blood pressure is above goal.  Continue to increase exercise to 150 minutes per week of moderate intensity  Continue to decrease sodium intake  We will check labs today  Thank you!  Important lifestyle changes to control high blood pressure  Intervention  Effect on the BP   Weight loss Weight loss is one of the most effective lifestyle changes for controlling blood pressure. If you're overweight or obese, losing even a small amount of weight can help reduce blood pressure.    Blood pressure can decrease by 1 millimeter of mercury (mmHg) with each kilogram (about 2.2 pounds) of weight lost.   Exercise regularly As a general goal, aim for 30 minutes of moderate physical activity every day.    Regular physical activity can lower blood pressure by 5 - 8 mmHg.   Eat a healthy diet Eat a diet rich in whole grains, fruits, vegetables, lean meat, and low-fat dairy products. Limit processed foods, saturated fat, and sweets.    A heart-healthy diet can lower high blood pressure by 10 mmHg.   Reduce salt (sodium) in your diet Aim for 000mg  of sodium each day. Avoid deli meats, canned food, and frozen microwave meals which are high in sodium.     Limiting sodium can reduce blood pressure by 5 mmHg.   Limit alcohol One drink equals 12 ounces of beer, 5 ounces of wine, or 1.5 ounces of 80-proof liquor.    Limiting alcohol to < 1 drink a day for women or < 2 drinks a day for men can help lower blood pressure by about 4 mmHg.   To check your pressure at home you will need to:   Sit up in a chair, with feet flat on the floor and back supported. Do not cross your ankles or legs. Rest your left arm so that the cuff is about heart level. If the cuff goes on your upper arm, then just relax your arm on the table, arm of the chair, or your lap. If you have a wrist cuff, hold your wrist against your chest at heart level. Place the cuff snugly around  your arm, about 1 inch above the crease of your elbow. The cords should be inside the groove of your elbow.  Sit quietly, with the cuff in place, for about 5 minutes. Then press the power button to start a reading. Do not talk or move while the reading is taking place.  Record your readings on a sheet of paper. Although most cuffs have a memory, it is often easier to see a pattern developing when the numbers are all in front of you.  You can repeat the reading after 1-3 minutes if it is recommended.   Make sure your bladder is empty and you have not had caffeine or tobacco within the last 30 minutes   Always bring your blood pressure log with you to your appointments. If you have not brought your monitor in to be double checked for accuracy, please bring it to your next appointment.   You can find a list of validated (accurate) blood pressure cuffs at: validatebp.org

## 2022-12-06 NOTE — Assessment & Plan Note (Addendum)
Assessment: BP is uncontrolled and above goal of <130/80.  Patient is not interested in additional medications.  Patient will try to increase her exercise and eat a healthier diet to reduce BP.   Plan: Increase exercise to 150 minutes per week of moderate intensity Decrease the amount of sodium in diet. Decrease eating out Continue Amlodipine 10mg  mg daily in the morning, Carvedilol 6.25mg  twice a day , Spironolactone 50mg  daily in the morning, Olmesartan 40mg  daily Check BMP today Follow up in 1 month

## 2022-12-09 ENCOUNTER — Ambulatory Visit: Payer: Medicare Other

## 2022-12-09 ENCOUNTER — Inpatient Hospital Stay: Payer: Medicare Other | Attending: Hematology and Oncology

## 2022-12-12 ENCOUNTER — Telehealth: Payer: Self-pay

## 2022-12-12 NOTE — Telephone Encounter (Signed)
Called her regarding missed injection appt on 11/29. She said that she called to cancel injection appt and will just keep appts as scheduled.

## 2022-12-21 ENCOUNTER — Ambulatory Visit (INDEPENDENT_AMBULATORY_CARE_PROVIDER_SITE_OTHER): Payer: Medicare Other | Admitting: Podiatry

## 2022-12-21 ENCOUNTER — Ambulatory Visit (INDEPENDENT_AMBULATORY_CARE_PROVIDER_SITE_OTHER): Payer: Medicare Other

## 2022-12-21 DIAGNOSIS — M778 Other enthesopathies, not elsewhere classified: Secondary | ICD-10-CM | POA: Diagnosis not present

## 2022-12-21 DIAGNOSIS — M2012 Hallux valgus (acquired), left foot: Secondary | ICD-10-CM | POA: Diagnosis not present

## 2022-12-21 NOTE — Progress Notes (Signed)
Subjective:  Patient ID: Karen Saunders, female    DOB: 12/27/1955,  MRN: [id number]  Chief Complaint  Patient presents with   Foot Pain    Left foot she thinks she cut the nail on pinky toe too close, The right foot she thinks she may have a bunion.     67 y.o. female presents with the above complaint.  Patient presents with left moderate bunion deformity.  Patient states is causing a little bit discomfort wanted get it evaluated discuss treatment options.  She has tried some conservative care none of which has helped.  Pain scale is 3 out of 10 dull aching nature.  She has not done shoe gear modification  Review of Systems: Negative except as noted in the HPI. Denies N/V/F/Ch.  Past Medical History:  Diagnosis Date   Anemia    with prev unremarkable heme eval ~2011   CAD (coronary artery disease)    Dr. Donnie Aho with Cards   History of gastric ulcer    2005    Hyperlipidemia    Hypertension    IBS (irritable bowel syndrome)    Leukopenia    with prev unremarkable heme eval ~2011   Migraine    Unsp w/o intract w/o status migrainosus   Osteoporosis 11/18/2004   Shingles    Trigeminal neuralgia 03/05/2012    Current Outpatient Medications:    aspirin EC 81 MG tablet, Take 1 tablet (81 mg total) by mouth daily. Swallow whole., Disp: , Rfl:    carbamazepine (TEGRETOL) 200 MG tablet, TAKE 2 TABLETS IN THE MORNING AND 2 TABLETS AT NIGHT, Disp: , Rfl:    carvedilol (COREG) 6.25 MG tablet, Take 1 tablet (6.25 mg total) by mouth 2 (two) times daily., Disp: 180 tablet, Rfl: 3   dexlansoprazole (DEXILANT) 60 MG capsule, Take 60 mg by mouth daily., Disp: , Rfl:    ezetimibe (ZETIA) 10 MG tablet, Take 1 tablet (10 mg total) by mouth daily., Disp: 90 tablet, Rfl: 3   gabapentin (NEURONTIN) 100 MG capsule, Take 100 mg by mouth daily as needed., Disp: , Rfl:    levocetirizine (XYZAL) 5 MG tablet, TAKE 1 TABLET (5 MG TOTAL) BY MOUTH DAILY., Disp: 90 tablet, Rfl: 4   olmesartan (BENICAR) 40 MG  tablet, Take 1 tablet (40 mg total) by mouth daily. Pharmacy, please d/c rx for olmesartan 20mg , Disp: 90 tablet, Rfl: 3   rosuvastatin (CRESTOR) 40 MG tablet, TAKE 1 TABLET BY MOUTH EVERY DAY, Disp: 90 tablet, Rfl: 3   spironolactone (ALDACTONE) 50 MG tablet, Take 1 tablet (50 mg total) by mouth daily., Disp: 90 tablet, Rfl: 3   traZODone (DESYREL) 50 MG tablet, TAKE 1 TABLET BY MOUTH EVERYDAY AT BEDTIME, Disp: 90 tablet, Rfl: 1   valACYclovir (VALTREX) 500 MG tablet, Take 500 mg by mouth as needed., Disp: , Rfl:    amLODipine (NORVASC) 10 MG tablet, Take 1 tablet (10 mg total) by mouth daily., Disp: 90 tablet, Rfl: 3  Social History   Tobacco Use  Smoking Status Never  Smokeless Tobacco Never    Allergies  Allergen Reactions   Gabapentin     Other Reaction(s): Confusion (intolerance), Delusions (intolerance)   Amoxicillin-Pot Clavulanate     REACTION: Rash on tongue   Azithromycin Other (See Comments)    diarrhea   Codeine Nausea Only   Delsym [Dextromethorphan]     GI upset.   Doxycycline     GI intolerance   Hydralazine     Nausea  Hydrochlorothiazide     Hyponatremia at 12.5mg  dose   Ibandronate Sodium     REACTION: GI side effects   Iohexol      Desc: HIVES,NASAL CONGESTION DURING A CARDIAC CATH. REQUIRES PRE-MEDS.    Raloxifene     REACTION: GI upset   Sulfonamide Derivatives     REACTION: itching   Venofer [Iron Sucrose] Other (See Comments) and Hypertension    Pt had rxn to Venofer see Hypersensitivity note from 05/24/2022.   Zoledronic Acid     REACTION: unable to take this as she was intolerant of pre-tx calcium and vitamin D   Iodinated Contrast Media Rash   Objective:  There were no vitals filed for this visit. There is no height or weight on file to calculate BMI. Constitutional Well developed. Well nourished.  Vascular Dorsalis pedis pulses palpable bilaterally. Posterior tibial pulses palpable bilaterally. Capillary refill normal to all digits.   No cyanosis or clubbing noted. Pedal hair growth normal.  Neurologic Normal speech. Oriented to person, place, and time. Epicritic sensation to light touch grossly present bilaterally.  Dermatologic Nails well groomed and normal in appearance. No open wounds. No skin lesions.  Orthopedic: Normal joint ROM without pain or crepitus bilaterally. Hallux abductovalgus deformity present moderate bunion deformity noted no intra-articular pain noted.  This is a track bound not a tracking deformity Left 1st MPJ diminished range of motion. Left 1st TMT without gross hypermobility. Right 1st MPJ diminished range of motion  Right 1st TMT without gross hypermobility. Lesser digital contractures absent bilaterally.   Radiographs: Taken and reviewed. Hallux abductovalgus deformity present. Metatarsal parabola normal. 1st/2nd IMA: Moderate; TSP: 5 out of 7  Assessment:   1. Hav (hallux abducto valgus), left    Plan:  Patient was evaluated and treated and all questions answered.  Hallux abductovalgus deformity, left -XR as above. -I explained to the patient the etiology of bunion deformity and worse treatment options were discussed.  At this time I discussed chevron osteotomy with phalangeal osteotomy I discussed with the patient in extensive detail she states understanding will think about the procedure and will get back to me.  Also discussed shoe gear modification.  No follow-ups on file.

## 2022-12-22 ENCOUNTER — Encounter: Payer: Self-pay | Admitting: Family Medicine

## 2022-12-22 ENCOUNTER — Ambulatory Visit (INDEPENDENT_AMBULATORY_CARE_PROVIDER_SITE_OTHER): Payer: Medicare Other | Admitting: Family Medicine

## 2022-12-22 VITALS — BP 140/78 | HR 51 | Temp 98.3°F | Ht 62.6 in | Wt 123.4 lb

## 2022-12-22 DIAGNOSIS — R059 Cough, unspecified: Secondary | ICD-10-CM | POA: Diagnosis not present

## 2022-12-22 DIAGNOSIS — I10 Essential (primary) hypertension: Secondary | ICD-10-CM

## 2022-12-22 DIAGNOSIS — Z7189 Other specified counseling: Secondary | ICD-10-CM

## 2022-12-22 DIAGNOSIS — E782 Mixed hyperlipidemia: Secondary | ICD-10-CM

## 2022-12-22 DIAGNOSIS — H9319 Tinnitus, unspecified ear: Secondary | ICD-10-CM

## 2022-12-22 DIAGNOSIS — M79646 Pain in unspecified finger(s): Secondary | ICD-10-CM

## 2022-12-22 DIAGNOSIS — I1A0 Resistant hypertension: Secondary | ICD-10-CM

## 2022-12-22 DIAGNOSIS — Z0000 Encounter for general adult medical examination without abnormal findings: Secondary | ICD-10-CM

## 2022-12-22 DIAGNOSIS — E538 Deficiency of other specified B group vitamins: Secondary | ICD-10-CM

## 2022-12-22 NOTE — Patient Instructions (Signed)
Go to the lab on the way out.   If you have mychart we'll likely use that to update you.    Take care.  Glad to see you. 

## 2022-12-22 NOTE — Progress Notes (Unsigned)
She is still helping care for her mother who has dementia.  I thanked her for her effort.    Pain in R thenar area.  Pain with movement, gripping.  Can radiate into the thumb.  Palmar side. Not dorsal side.  Going on a few weeks.  Variable amount of pain.  No clear trigger.  No L hand sx.    Hypertension:    Using medication without problems or lightheadedness: yes Chest pain with exertion:no Edema:no Short of breath:no Average home BPs: usually controlled on home checks in late PM.    No new HA or neuro sx o/w.    She has hematology f/u pending for 2025, d/w pt.  She is on B12 replacement per hematology.    Ear sx improved with tegretol, d/w pt.    Elevated Cholesterol: Using medications without problems: yes Muscle aches: no Diet compliance: yes Exercise: d/w pt.  Walking.    Gabapentin helped with cough.    Flu 2024 Shingles discussed with patient PNA updated 2022 Tetanus 2010, discussed with patient COVID-vaccine previously done Colonoscopy 2023 Mammogram per gynecology.  [facility] 2024 Bone density test deferred 2024 Living will d/w pt.  Would have her sister Lura Em and her son Casimiro Needle equally designated if patient were incapacitated.   Meds, vitals, and allergies reviewed.   ROS: Per HPI unless specifically indicated in ROS section   GEN: nad, alert and oriented HEENT: NCAT NECK: supple w/o LA CV: rrr. PULM: ctab, no inc wob ABD: soft, +bs EXT: no edema SKIN: well perfused.   Right thumb without swelling or erythema.  Distally neurovascular intact and she has range of motion intact but she has discomfort with range of motion at the right thenar area, palmar side.

## 2022-12-23 LAB — LIPID PANEL
Cholesterol: 150 mg/dL (ref 0–200)
HDL: 66.6 mg/dL (ref >39.00)
LDL Cholesterol: 70 mg/dL (ref 0–99)
NonHDL: 83.73
Total CHOL/HDL Ratio: 2
Triglycerides: 67 mg/dL (ref 0.0–149.0)
VLDL: 13.4 mg/dL (ref 0.0–40.0)

## 2022-12-23 LAB — HEPATIC FUNCTION PANEL
ALT: 18 U/L (ref 0–35)
AST: 22 U/L (ref 0–37)
Albumin: 4.5 g/dL (ref 3.5–5.2)
Alkaline Phosphatase: 74 U/L (ref 39–117)
Bilirubin, Direct: 0.1 mg/dL (ref 0.0–0.3)
Total Bilirubin: 0.3 mg/dL (ref 0.2–1.2)
Total Protein: 6.6 g/dL (ref 6.0–8.3)

## 2022-12-25 DIAGNOSIS — Z0000 Encounter for general adult medical examination without abnormal findings: Secondary | ICD-10-CM | POA: Insufficient documentation

## 2022-12-25 DIAGNOSIS — R059 Cough, unspecified: Secondary | ICD-10-CM | POA: Insufficient documentation

## 2022-12-25 NOTE — Assessment & Plan Note (Signed)
She has hematology f/u pending for 2025, d/w pt.  She is on B12 replacement per hematology.

## 2022-12-25 NOTE — Assessment & Plan Note (Signed)
Reasonable on recheck here in clinic.  Continue amlodipine carvedilol olmesartan spironolactone.

## 2022-12-25 NOTE — Assessment & Plan Note (Signed)
Continue Crestor Zetia.  See notes on labs.

## 2022-12-25 NOTE — Assessment & Plan Note (Signed)
Flu 2024 Shingles discussed with patient PNA updated 2022 Tetanus 2010, discussed with patient COVID-vaccine previously done Colonoscopy 2023 Mammogram per gynecology.  [facility] 2024 Bone density test deferred 2024 Living will d/w pt.  Would have her sister Karen Saunders and her son Karen Needle equally designated if patient were incapacitated.

## 2022-12-25 NOTE — Assessment & Plan Note (Signed)
Living will d/w pt.  Would have her sister Lura Em and her son Casimiro Needle equally designated if patient were incapacitated.

## 2022-12-25 NOTE — Assessment & Plan Note (Signed)
Improved with gabapentin use.  Not sedated.  Lungs clear.  Continue as is.

## 2022-12-25 NOTE — Assessment & Plan Note (Signed)
Continue Tegretol.  Symptoms improved with medication use.

## 2022-12-25 NOTE — Assessment & Plan Note (Signed)
D/w pt about using a spica brace.  She can let me know if is not helpful.

## 2022-12-26 ENCOUNTER — Encounter: Payer: Self-pay | Admitting: Family Medicine

## 2023-01-06 ENCOUNTER — Inpatient Hospital Stay: Payer: Medicare Other | Attending: Hematology and Oncology

## 2023-01-06 VITALS — BP 161/83 | HR 56 | Temp 97.9°F | Resp 16

## 2023-01-06 DIAGNOSIS — E538 Deficiency of other specified B group vitamins: Secondary | ICD-10-CM | POA: Diagnosis present

## 2023-01-06 DIAGNOSIS — D539 Nutritional anemia, unspecified: Secondary | ICD-10-CM

## 2023-01-06 MED ORDER — CYANOCOBALAMIN 1000 MCG/ML IJ SOLN
1000.0000 ug | Freq: Once | INTRAMUSCULAR | Status: AC
  Administered 2023-01-06: 1000 ug via INTRAMUSCULAR
  Filled 2023-01-06: qty 1

## 2023-01-13 ENCOUNTER — Other Ambulatory Visit: Payer: Self-pay | Admitting: Family Medicine

## 2023-01-16 ENCOUNTER — Ambulatory Visit: Payer: Medicare Other | Attending: Cardiology | Admitting: Pharmacist

## 2023-01-16 VITALS — BP 170/90 | HR 51

## 2023-01-16 DIAGNOSIS — I10 Essential (primary) hypertension: Secondary | ICD-10-CM | POA: Diagnosis not present

## 2023-01-16 MED ORDER — HYDRALAZINE HCL 25 MG PO TABS: 25.0000 mg | ORAL_TABLET | Freq: Three times a day (TID) | ORAL | 3 refills | Status: DC

## 2023-01-16 NOTE — Assessment & Plan Note (Signed)
 Assessment: Blood pressure above goal in clinic today  Patient resistant to more medications Discussed the risks of prolonged increased blood pressure including stroke, MI, CHF, CKD, PAD Discussed adding hydralazine  vs clonidine Hard to tell if hydralazine  was really the cause of her nausea in hospital No room to increase carvedilol  due to HR Hyponatremia with hydrochlorothiazide  Maxed out on CCB and ARB  Plan: Patient willing to try hydralazine  again. Start hydralazine  25mg  TID Continue amlodipine  10mg  daily, spironolactone  50mg , olmesartan  40mg  and carvedilol  6.25mg  BID Continue checking BP at home Increase exercise Follow up in 4 weeks

## 2023-01-16 NOTE — Progress Notes (Signed)
 Patient ID: Karen Saunders                 DOB: 01/10/1956                      MRN: [id number]      HPI: Karen Saunders is a 67 y.o. female referred by Dr. Court to HTN clinic. PMH is significant for CAD, HTN, and hx of CABG.  Takes carbamazepine  for tinnitus.  Patient was seen in HTN clinic 08/23/22. She only provided 3 home readings 150-160 /80 range. Had planned to check renal function and resume ARB, but labs were not optimal. Olmesartan  was eventually added by Callie on 8/29. Her metoprolol  was also changed to carvedilol .    Patient was seen on 09/23/22.  At that visit she reported only taking carvedilol  once a day.  She also reported that she did not resume her olmesartan  out of fear for her kidneys.  We discussed the reason she had a decline in her kidney function back in January was due to dehydration from diarrhea.  Discussed the limited blood pressure options we have for her and asked patient to resume olmesartan .   Patient was seen on 10/03/22.  She reported she is taking her carvedilol  twice a day and has resumed her olmesartan .  She brought in BP readings with 1/5 at goal.  Average 137/83.  She reported that she does walk when she goes to her mom's house in [place] 2-3 times a week but does not feel comfortable walking alone.  Her home cuff had previously been found to be accurate.  Patient seen 11/17/22. BP in office was elevated at 158/82 and 160/84 with HR of 77. Patients home BP readings averaged 143/87 with HR in the 50's-60's. She was educated on sodium intake and increasing exercise. Spironolactone  was increased to 50mg  daily.   At last visit on 12/06/22, BP still in the 150's/80's. She was cooking more at home. She did not want to add more medications. Strongy enforced lifestyle including exercise and salt restriction.  Patient presents today for follow up. She denies any dizziness, lightheadedness, headache, blurred vision, SOB, swelling. Home BP are 140's/80's mostly with a  few lower readings. She took some cold medicine a few days ago and BP was 170's just like it is today. She is reluctant to add more medications. Frustrated because she says her blood pressure was always good when she was with Dr. Blanca. Recalls hydralazine  causing nausea in the hospital in [name] 2024, however she was admitted with n/v/d, was also on a cardene  drip and received pain medications. Did not want to do clonidine when she heard it could cause rebound HTN is she missed doses.  She has cut back on her sodium. Not eating deli turkey anymore. Decreased her bacon intake. Has not increased her walking.    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  Current HTN meds:  Amlodipine  10mg  mg daily in the morning Carvedilol  6.25mg  twice a day  Spironolctone 50mg  daily in the morning Olmesartan  40mg  daily  Intolerances: Hydralazine  (nausea) hydrochlorothiazide  (hyponatremia) Tried medications: valsartan -hctz, telmisartan -hctz, lisinopril  Goal: <130/80  Exercise: walks 2-3 times a week for 20-30 minutes at her moms ([place] Will check out the gym at her church and try to start walking more there.   Diet: Eats out everyday except Sundays (cooks with sister). Eats at [employer] and [employer] less since last visit  on 11/07. She said she avoids deli meat now, such as turkey since she knows it is not the best choice. She cooks more often now and cooks baked chicken with rice and a frozen vegetable (carrots, peas, green beans) For breakfast, she cooks when she does not have to work which typically includes bacon, eggs, toast, or grits. She also likes sweets. She does not drink any coffee and limits soda intake and only drinks ginger ale or 7-up.    Home BP readings: HR 50's 147 90  146 92  146 87  148 90  135 87  142 86  144 88  112 67  148 99  131 86  134 89  147 89  140  85  117 76  138.3571 86.5     Wt Readings from Last 3 Encounters:   12/22/22 123 lb 6.4 oz (56 kg)  12/05/22 120 lb (54.4 kg)  09/06/22 120 lb 12.8 oz (54.8 kg)   BP Readings from Last 3 Encounters:  01/16/23 (!) 170/90  01/06/23 (!) 161/83  12/22/22 (!) 140/78   Pulse Readings from Last 3 Encounters:  01/16/23 (!) 51  01/06/23 (!) 56  12/22/22 (!) 51    Renal function: CrCl cannot be calculated (Patient's most recent lab result is older than the maximum 21 days allowed.).  Past Medical History:  Diagnosis Date   Anemia    with prev unremarkable heme eval ~2011   CAD (coronary artery disease)    Dr. Blanca with Cards   History of gastric ulcer    2005    Hyperlipidemia    Hypertension    IBS (irritable bowel syndrome)    Leukopenia    with prev unremarkable heme eval ~2011   Migraine    Unsp w/o intract w/o status migrainosus   Osteoporosis 11/18/2004   Shingles    Trigeminal neuralgia 03/05/2012    Current Outpatient Medications on File Prior to Visit  Medication Sig Dispense Refill   aspirin  EC 81 MG tablet Take 1 tablet (81 mg total) by mouth daily. Swallow whole.     carbamazepine  (TEGRETOL ) 200 MG tablet TAKE 2 TABLETS IN THE MORNING AND 2 TABLETS AT NIGHT     carvedilol  (COREG ) 6.25 MG tablet Take 1 tablet (6.25 mg total) by mouth 2 (two) times daily. 180 tablet 3   dexlansoprazole (DEXILANT) 60 MG capsule Take 60 mg by mouth daily.     ezetimibe  (ZETIA ) 10 MG tablet Take 1 tablet (10 mg total) by mouth daily. 90 tablet 3   gabapentin  (NEURONTIN ) 100 MG capsule Take 100 mg by mouth daily as needed.     levocetirizine (XYZAL ) 5 MG tablet TAKE 1 TABLET (5 MG TOTAL) BY MOUTH DAILY. 90 tablet 4   olmesartan  (BENICAR ) 40 MG tablet Take 1 tablet (40 mg total) by mouth daily. Pharmacy, please d/c rx for olmesartan  20mg  90 tablet 3   rosuvastatin  (CRESTOR ) 40 MG tablet TAKE 1 TABLET BY MOUTH EVERY DAY 90 tablet 3   spironolactone  (ALDACTONE ) 50 MG tablet Take 1 tablet (50 mg total) by mouth daily. 90 tablet 3   traZODone  (DESYREL ) 50  MG tablet TAKE 1 TABLET BY MOUTH EVERYDAY AT BEDTIME 90 tablet 1   valACYclovir (VALTREX) 500 MG tablet Take 500 mg by mouth as needed.     amLODipine  (NORVASC ) 10 MG tablet Take 1 tablet (10 mg total) by mouth daily. 90 tablet 3   No current facility-administered medications on file prior to visit.  Allergies  Allergen Reactions   Gabapentin      Other Reaction(s): Confusion (intolerance), Delusions (intolerance)   Amoxicillin-Pot Clavulanate     REACTION: Rash on tongue   Azithromycin  Other (See Comments)    diarrhea   Codeine  Nausea Only   Delsym [Dextromethorphan]     GI upset.   Doxycycline      GI intolerance   Hydralazine      Nausea    Hydrochlorothiazide      Hyponatremia at 12.5mg  dose   Ibandronate Sodium     REACTION: GI side effects   Iohexol       Desc: HIVES,NASAL CONGESTION DURING A CARDIAC CATH. REQUIRES PRE-MEDS.    Raloxifene     REACTION: GI upset   Sulfonamide Derivatives     REACTION: itching   Venofer  [Iron  Sucrose] Other (See Comments) and Hypertension    Pt had rxn to Venofer  see Hypersensitivity note from 05/24/2022.   Zoledronic Acid     REACTION: unable to take this as she was intolerant of pre-tx calcium  and vitamin D    Iodinated Contrast Media Rash     Blood pressure (!) 170/90, pulse (!) 51.   Assessment/Plan: HYPERTENSION CONTROL Vitals:   01/16/23 1555 01/16/23 1606  BP: (!) 170/90 (!) 170/90    The patient's blood pressure is elevated above target today.  In order to address the patient's elevated BP: A new medication was prescribed today.      1. Hypertension -  Essential hypertension Assessment: Blood pressure above goal in clinic today  Patient resistant to more medications Discussed the risks of prolonged increased blood pressure including stroke, MI, CHF, CKD, PAD Discussed adding hydralazine  vs clonidine Hard to tell if hydralazine  was really the cause of her nausea in hospital No room to increase carvedilol  due to  HR Hyponatremia with hydrochlorothiazide  Maxed out on CCB and ARB  Plan: Patient willing to try hydralazine  again. Start hydralazine  25mg  TID Continue amlodipine  10mg  daily, spironolactone  50mg , olmesartan  40mg  and carvedilol  6.25mg  BID Continue checking BP at home Increase exercise Follow up in 4 weeks    Thank you,  Derek Huneycutt D Finlee Milo, Pharm.D, BCACP, CPP [facility] [address] [place], [place] [address]  Phone: [phone]; Fax: [phone]

## 2023-01-16 NOTE — Patient Instructions (Addendum)
 Your blood pressure goal is < 130/20mmHg   Start taking hydralazine  25mg  three times a day  Please continue: Amlodipine  10mg  mg daily in the morning Carvedilol  6.25mg  twice a day  Spironolctone 50mg  daily in the morning Olmesartan  40mg  daily  Continue checking blood pressure at home.  Important lifestyle changes to control high blood pressure  Intervention  Effect on the BP   Weight loss Weight loss is one of the most effective lifestyle changes for controlling blood pressure. If you're overweight or obese, losing even a small amount of weight can help reduce blood pressure.    Blood pressure can decrease by 1 millimeter of mercury (mmHg) with each kilogram (about 2.2 pounds) of weight lost.   Exercise regularly As a general goal, aim for 30 minutes of moderate physical activity every day.    Regular physical activity can lower blood pressure by 5 - 8 mmHg.   Eat a healthy diet Eat a diet rich in whole grains, fruits, vegetables, lean meat, and low-fat dairy products. Limit processed foods, saturated fat, and sweets.    A heart-healthy diet can lower high blood pressure by 10 mmHg.   Reduce salt (sodium) in your diet Aim for 000mg  of sodium each day. Avoid deli meats, canned food, and frozen microwave meals which are high in sodium.     Limiting sodium can reduce blood pressure by 5 mmHg.   Limit alcohol One drink equals 12 ounces of beer, 5 ounces of wine, or 1.5 ounces of 80-proof liquor.    Limiting alcohol to < 1 drink a day for women or < 2 drinks a day for men can help lower blood pressure by about 4 mmHg.   To check your pressure at home you will need to:   Sit up in a chair, with feet flat on the floor and back supported. Do not cross your ankles or legs. Rest your left arm so that the cuff is about heart level. If the cuff goes on your upper arm, then just relax your arm on the table, arm of the chair, or your lap. If you have a wrist cuff, hold your wrist  against your chest at heart level. Place the cuff snugly around your arm, about 1 inch above the crease of your elbow. The cords should be inside the groove of your elbow.  Sit quietly, with the cuff in place, for about 5 minutes. Then press the power button to start a reading. Do not talk or move while the reading is taking place.  Record your readings on a sheet of paper. Although most cuffs have a memory, it is often easier to see a pattern developing when the numbers are all in front of you.  You can repeat the reading after 1-3 minutes if it is recommended.   Make sure your bladder is empty and you have not had caffeine  or tobacco within the last 30 minutes   Always bring your blood pressure log with you to your appointments. If you have not brought your monitor in to be double checked for accuracy, please bring it to your next appointment.   You can find a list of validated (accurate) blood pressure cuffs at: validatebp.org

## 2023-01-26 ENCOUNTER — Encounter: Payer: Self-pay | Admitting: Family Medicine

## 2023-01-26 ENCOUNTER — Other Ambulatory Visit: Payer: Self-pay | Admitting: Family Medicine

## 2023-01-26 MED ORDER — GABAPENTIN 100 MG PO CAPS: 100.0000 mg | ORAL_CAPSULE | Freq: Every day | ORAL | 3 refills | Status: DC | PRN

## 2023-02-10 ENCOUNTER — Inpatient Hospital Stay: Payer: Medicare Other | Attending: Hematology and Oncology

## 2023-02-10 VITALS — BP 148/83 | HR 54 | Temp 98.3°F | Resp 18

## 2023-02-10 DIAGNOSIS — E538 Deficiency of other specified B group vitamins: Secondary | ICD-10-CM | POA: Insufficient documentation

## 2023-02-10 DIAGNOSIS — D539 Nutritional anemia, unspecified: Secondary | ICD-10-CM

## 2023-02-10 MED ORDER — CYANOCOBALAMIN 1000 MCG/ML IJ SOLN
1000.0000 ug | Freq: Once | INTRAMUSCULAR | Status: AC
  Administered 2023-02-10: 1000 ug via INTRAMUSCULAR
  Filled 2023-02-10: qty 1

## 2023-02-15 ENCOUNTER — Encounter: Payer: Self-pay | Admitting: Family Medicine

## 2023-02-16 ENCOUNTER — Other Ambulatory Visit: Payer: Self-pay | Admitting: Family Medicine

## 2023-02-16 MED ORDER — TRAZODONE HCL 50 MG PO TABS: 75.0000 mg | ORAL_TABLET | Freq: Every evening | ORAL | Status: DC | PRN

## 2023-02-23 ENCOUNTER — Ambulatory Visit: Payer: Medicare Other | Attending: Cardiology | Admitting: Pharmacist

## 2023-02-23 VITALS — BP 126/72 | HR 58

## 2023-02-23 DIAGNOSIS — I119 Hypertensive heart disease without heart failure: Secondary | ICD-10-CM

## 2023-02-23 NOTE — Assessment & Plan Note (Signed)
Assessment: Blood pressure on repeat in clinic today at goal less than 130/80 Home average around 130/80 Tolerating hydralazine well Has had to go to [facility] to take care of her mom more often and therefore has not walked as much  Plan: Continue amlodipine 10 mg, carvedilol 6.25 mg twice a day, spironolactone 50 mg daily, olmesartan 40 mg daily and hydralazine 25 mg 3 times a day I have asked her to take her hydralazine in the morning around 2-3 and then her last dose at bedtime for more even spacing Patient to follow-up as needed

## 2023-02-23 NOTE — Progress Notes (Signed)
Patient ID: Karen Saunders                 DOB: 1955-05-08                      MRN: [id number]      HPI: Karen Saunders is a 68 y.o. female referred by Dr. Allyson Sabal to HTN clinic. PMH is significant for CAD, HTN, and hx of CABG.  Takes carbamazepine for tinnitus.  Patient was seen in HTN clinic 08/23/22. She only provided 3 home readings 150-160 /80 range. Had planned to check renal function and resume ARB, but labs were not optimal. Olmesartan was eventually added by Callie on 8/29. Her metoprolol was also changed to carvedilol.    Patient was seen on 09/23/22.  At that visit she reported only taking carvedilol once a day.  She also reported that she did not resume her olmesartan out of fear for her kidneys.  We discussed the reason she had a decline in her kidney function back in January was due to dehydration from diarrhea.  Discussed the limited blood pressure options we have for her and asked patient to resume olmesartan.   Patient was seen on 10/03/22.  She reported she is taking her carvedilol twice a day and has resumed her olmesartan.  She brought in BP readings with 1/5 at goal.  Average 137/83.  She reported that she does walk when she goes to her mom's house in [place] 2-3 times a week but does not feel comfortable walking alone.  Her home cuff had previously been found to be accurate.  Patient seen 11/17/22. BP in office was elevated at 158/82 and 160/84 with HR of 77. Patients home BP readings averaged 143/87 with HR in the 50's-60's. She was educated on sodium intake and increasing exercise. Spironolactone was increased to 50mg  daily.   At last visit on 12/06/22, BP still in the 150's/80's. She was cooking more at home. She did not want to add more medications. Strongy enforced lifestyle including exercise and salt restriction.  Last seen 01/16/2023.  Blood pressure was elevated in clinic.  Patient had taken cold medicine for a few days.  Reported typically blood pressures 140s/80's.   After long discussion patient willing to try hydralazine 25 mg 3 times daily.  Patient presents today to hypertension clinic for follow-up.  She brings in a list of blood pressure readings.  Many at goal or close to goal.  Average 130/80.  She has been taking her hydralazine at 10 AM around 2-3 and at dinner (either 4-5 or 7 depending if she is working).  Denies any dizziness lightheadedness.  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  Current HTN meds:  Amlodipine 10mg  mg daily in the morning Carvedilol 6.25mg  twice a day  Spironolctone 50mg  daily in the morning Olmesartan 40mg  daily Hydralazine 25 mg 3 times a day  Intolerances: Hydralazine (nausea) hydrochlorothiazide (hyponatremia) Tried medications: valsartan-hctz, telmisartan-hctz, lisinopril Goal: <130/80  Exercise: walks 2-3 times a week for 20-30 minutes at her moms [place] Will check out the gym at her church and try to start walking more there.   Diet: Eats out everyday except Sundays (cooks with sister). Eats at [employer] and [employer] less since last visit on 11/07. She said she avoids deli meat now, such as [place] since  she knows it is not the best choice. She cooks more often now and cooks baked chicken with rice and a frozen vegetable (carrots, peas, green beans) For breakfast, she cooks when she does not have to work which typically includes bacon, eggs, toast, or grits. She also likes sweets. She does not drink any coffee and limits soda intake and only drinks ginger ale or 7-up.    Home BP readings: HR 50's 122 78  134 82  123 74  123 80  123 76  127 82  133 83  134 85  132 79  132 83  139 78  132 82  137  83  130.0769 80.38462     Wt Readings from Last 3 Encounters:  12/22/22 123 lb 6.4 oz (56 kg)  12/05/22 120 lb (54.4 kg)  09/06/22 120 lb 12.8 oz  (54.8 kg)   BP Readings from Last 3 Encounters:  02/23/23 126/72  02/10/23 (!) 148/83  01/16/23 (!) 170/90   Pulse Readings from Last 3 Encounters:  02/23/23 (!) 58  02/10/23 (!) 54  01/16/23 (!) 51    Renal function: CrCl cannot be calculated (Patient's most recent lab result is older than the maximum 21 days allowed.).  Past Medical History:  Diagnosis Date   Anemia    with prev unremarkable heme eval ~2011   CAD (coronary artery disease)    Dr. Donnie Aho with Cards   History of gastric ulcer    2005    Hyperlipidemia    Hypertension    IBS (irritable bowel syndrome)    Leukopenia    with prev unremarkable heme eval ~2011   Migraine    Unsp w/o intract w/o status migrainosus   Osteoporosis 11/18/2004   Shingles    Trigeminal neuralgia 03/05/2012    Current Outpatient Medications on File Prior to Visit  Medication Sig Dispense Refill   aspirin EC 81 MG tablet Take 1 tablet (81 mg total) by mouth daily. Swallow whole.     carbamazepine (TEGRETOL) 200 MG tablet TAKE 2 TABLETS IN THE MORNING AND 2 TABLETS AT NIGHT     carvedilol (COREG) 6.25 MG tablet Take 1 tablet (6.25 mg total) by mouth 2 (two) times daily. 180 tablet 3   dexlansoprazole (DEXILANT) 60 MG capsule Take 60 mg by mouth daily.     ezetimibe (ZETIA) 10 MG tablet Take 1 tablet (10 mg total) by mouth daily. 90 tablet 3   gabapentin (NEURONTIN) 100 MG capsule Take 1 capsule (100 mg total) by mouth daily as needed. 90 capsule 3   hydrALAZINE (APRESOLINE) 25 MG tablet Take 1 tablet (25 mg total) by mouth 3 (three) times daily. 90 tablet 3   levocetirizine (XYZAL) 5 MG tablet TAKE 1 TABLET (5 MG TOTAL) BY MOUTH DAILY. 90 tablet 4   olmesartan (BENICAR) 40 MG tablet Take 1 tablet (40 mg total) by mouth daily. Pharmacy, please d/c rx for olmesartan 20mg  90 tablet 3   rosuvastatin (CRESTOR) 40 MG tablet TAKE 1 TABLET BY MOUTH EVERY DAY 90 tablet 3   spironolactone (ALDACTONE) 50 MG tablet Take 1 tablet (50 mg total) by  mouth daily. 90 tablet 3   traZODone (DESYREL) 50 MG tablet Take 1.5-2 tablets (75-100 mg total) by mouth at bedtime as needed for sleep.     valACYclovir (VALTREX) 500 MG tablet Take 500 mg by mouth as needed.     amLODipine (NORVASC) 10 MG tablet Take 1 tablet (10 mg total) by mouth daily. 90 tablet 3  No current facility-administered medications on file prior to visit.    Allergies  Allergen Reactions   Amoxicillin-Pot Clavulanate     REACTION: Rash on tongue   Azithromycin Other (See Comments)    diarrhea   Codeine Nausea Only   Delsym [Dextromethorphan]     GI upset.   Doxycycline     GI intolerance   Hydralazine     Nausea    Hydrochlorothiazide     Hyponatremia at 12.5mg  dose   Ibandronate Sodium     REACTION: GI side effects   Iohexol      Desc: HIVES,NASAL CONGESTION DURING A CARDIAC CATH. REQUIRES PRE-MEDS.    Raloxifene     REACTION: GI upset   Sulfonamide Derivatives     REACTION: itching   Venofer [Iron Sucrose] Other (See Comments) and Hypertension    Pt had rxn to Venofer see Hypersensitivity note from 05/24/2022.   Zoledronic Acid     REACTION: unable to take this as she was intolerant of pre-tx calcium and vitamin D   Iodinated Contrast Media Rash     Blood pressure 126/72, pulse (!) 58.   Assessment/Plan:     1. Hypertension -  Hypertensive heart disease Assessment: Blood pressure on repeat in clinic today at goal less than 130/80 Home average around 130/80 Tolerating hydralazine well Has had to go to [facility] to take care of her mom more often and therefore has not walked as much  Plan: Continue amlodipine 10 mg, carvedilol 6.25 mg twice a day, spironolactone 50 mg daily, olmesartan 40 mg daily and hydralazine 25 mg 3 times a day I have asked her to take her hydralazine in the morning around 2-3 and then her last dose at bedtime for more even spacing Patient to follow-up as needed   Thank you,  Olene Floss, Pharm.D, BCACP,  CPP [facility] A [facility] of [facility] [address], [place], [place] [address]  Phone: [phone]; Fax: [phone]

## 2023-02-23 NOTE — Patient Instructions (Addendum)
Take hydralazine with breakfast, lunch and at bedtime  Continue:   Amlodipine 10mg  mg daily in the morning Carvedilol 6.25mg  twice a day  Spironolctone 50mg  daily in the morning Olmesartan 40mg  daily Hydralazine 25 mg 3 times a day

## 2023-03-01 ENCOUNTER — Other Ambulatory Visit: Payer: Self-pay | Admitting: Family Medicine

## 2023-03-10 ENCOUNTER — Telehealth: Payer: Self-pay

## 2023-03-10 ENCOUNTER — Inpatient Hospital Stay (HOSPITAL_BASED_OUTPATIENT_CLINIC_OR_DEPARTMENT_OTHER): Payer: Medicare Other | Admitting: Hematology and Oncology

## 2023-03-10 ENCOUNTER — Inpatient Hospital Stay: Payer: Medicare Other

## 2023-03-10 ENCOUNTER — Inpatient Hospital Stay: Payer: Medicare Other | Attending: Hematology and Oncology

## 2023-03-10 ENCOUNTER — Encounter: Payer: Self-pay | Admitting: Hematology and Oncology

## 2023-03-10 VITALS — BP 159/76 | HR 51 | Temp 97.9°F | Resp 18 | Ht 62.6 in | Wt 122.2 lb

## 2023-03-10 DIAGNOSIS — D696 Thrombocytopenia, unspecified: Secondary | ICD-10-CM

## 2023-03-10 DIAGNOSIS — E538 Deficiency of other specified B group vitamins: Secondary | ICD-10-CM

## 2023-03-10 DIAGNOSIS — D539 Nutritional anemia, unspecified: Secondary | ICD-10-CM | POA: Diagnosis not present

## 2023-03-10 DIAGNOSIS — D72819 Decreased white blood cell count, unspecified: Secondary | ICD-10-CM

## 2023-03-10 DIAGNOSIS — D508 Other iron deficiency anemias: Secondary | ICD-10-CM

## 2023-03-10 LAB — CBC WITH DIFFERENTIAL (CANCER CENTER ONLY)
Abs Immature Granulocytes: 0 10*3/uL (ref 0.00–0.07)
Basophils Absolute: 0 10*3/uL (ref 0.0–0.1)
Basophils Relative: 1 %
Eosinophils Absolute: 0 10*3/uL (ref 0.0–0.5)
Eosinophils Relative: 1 %
HCT: 29.8 % — ABNORMAL LOW (ref 36.0–46.0)
Hemoglobin: 10 g/dL — ABNORMAL LOW (ref 12.0–15.0)
Immature Granulocytes: 0 %
Lymphocytes Relative: 52 %
Lymphs Abs: 0.9 10*3/uL (ref 0.7–4.0)
MCH: 31.9 pg (ref 26.0–34.0)
MCHC: 33.6 g/dL (ref 30.0–36.0)
MCV: 95.2 fL (ref 80.0–100.0)
Monocytes Absolute: 0.2 10*3/uL (ref 0.1–1.0)
Monocytes Relative: 12 %
Neutro Abs: 0.6 10*3/uL — ABNORMAL LOW (ref 1.7–7.7)
Neutrophils Relative %: 34 %
Platelet Count: 116 10*3/uL — ABNORMAL LOW (ref 150–400)
RBC: 3.13 MIL/uL — ABNORMAL LOW (ref 3.87–5.11)
RDW: 12.5 % (ref 11.5–15.5)
WBC Count: 1.7 10*3/uL — ABNORMAL LOW (ref 4.0–10.5)
nRBC: 0 % (ref 0.0–0.2)

## 2023-03-10 LAB — BASIC METABOLIC PANEL - CANCER CENTER ONLY
Anion gap: 4 — ABNORMAL LOW (ref 5–15)
BUN: 22 mg/dL (ref 8–23)
CO2: 29 mmol/L (ref 22–32)
Calcium: 9.2 mg/dL (ref 8.9–10.3)
Chloride: 110 mmol/L (ref 98–111)
Creatinine: 1.16 mg/dL — ABNORMAL HIGH (ref 0.44–1.00)
GFR, Estimated: 51 mL/min — ABNORMAL LOW (ref >60)
Glucose, Bld: 90 mg/dL (ref 70–99)
Potassium: 4.1 mmol/L (ref 3.5–5.1)
Sodium: 143 mmol/L (ref 135–145)

## 2023-03-10 LAB — RETICULOCYTES
Immature Retic Fract: 9.6 % (ref 2.3–15.9)
RBC.: 3.1 MIL/uL — ABNORMAL LOW (ref 3.87–5.11)
Retic Count, Absolute: 49.6 10*3/uL (ref 19.0–186.0)
Retic Ct Pct: 1.6 % (ref 0.4–3.1)

## 2023-03-10 LAB — IRON AND IRON BINDING CAPACITY (CC-WL,HP ONLY)
Iron: 113 ug/dL (ref 28–170)
Saturation Ratios: 34 % — ABNORMAL HIGH (ref 10.4–31.8)
TIBC: 329 ug/dL (ref 250–450)
UIBC: 216 ug/dL (ref 148–442)

## 2023-03-10 LAB — VITAMIN B12: Vitamin B-12: 277 pg/mL (ref 180–914)

## 2023-03-10 LAB — FERRITIN: Ferritin: 170 ng/mL (ref 11–307)

## 2023-03-10 LAB — SEDIMENTATION RATE: Sed Rate: 15 mm/h (ref 0–22)

## 2023-03-10 MED ORDER — CYANOCOBALAMIN 1000 MCG/ML IJ SOLN
1000.0000 ug | Freq: Once | INTRAMUSCULAR | Status: AC
  Administered 2023-03-10: 1000 ug via INTRAMUSCULAR
  Filled 2023-03-10: qty 1

## 2023-03-10 NOTE — Telephone Encounter (Signed)
 Called and given below message. She verbalized understanding. She is not sure if she wants to continue b12 injections and will call the office back Monday.

## 2023-03-10 NOTE — Assessment & Plan Note (Addendum)
 Repeat vitamin B12 level is pending We will proceed with B12 injection today I will call the patient with test results next week and if her B12 level improves, we can potentially space out her future appointment

## 2023-03-10 NOTE — Assessment & Plan Note (Addendum)
This was relatively new over the past 2 years I suspect this is due to side effects of carbamazepine She is not symptomatic She can continue taking carbamazepine as this is helping her

## 2023-03-10 NOTE — Assessment & Plan Note (Addendum)
 Multifactorial, there is a component of iron deficiency anemia in the past as well as anemia chronic illness Repeat vitamin B12 and iron studies are pending She is not symptomatic Observe closely

## 2023-03-10 NOTE — Telephone Encounter (Signed)
-----   Message from [employer] sent at 03/10/2023  1:57 PM EST ----- Pls call her Iron studies are good B12 level only improves a bit, stll in 200 range I recommend monthly B12 inj for 6 months and then will see her again. I will send LOS

## 2023-03-10 NOTE — Assessment & Plan Note (Addendum)
 This could be constitutional versus due to vitamin B12 deficiency Her B12 level is pending but despite aggressive vitamin B12 replacement, she has persistent chronic leukopenia I suspect her chronic leukopenia is constitutional She is not symptomatic Observe only

## 2023-03-10 NOTE — Progress Notes (Signed)
  [facility] OFFICE PROGRESS NOTE  Patient Care Team: Joaquim Nam, MD as PCP - General Charna Elizabeth, MD as Consulting Physician (Gastroenterology) Othella Boyer, MD as Consulting Physician (Cardiology) Glendale Chard, DO as Consulting Physician (Neurology) Runell Gess, MD as Consulting Physician (Cardiology) Barnie Alderman, MD as Referring Physician (Otolaryngology) [facility] Huel Cote, MD as Consulting Physician (Obstetrics and Gynecology)  Assessment & Plan Chronic leukopenia This could be constitutional versus due to vitamin B12 deficiency Her B12 level is pending but despite aggressive vitamin B12 replacement, she has persistent chronic leukopenia I suspect her chronic leukopenia is constitutional She is not symptomatic Observe only Deficiency anemia Multifactorial, there is a component of iron deficiency anemia in the past as well as anemia chronic illness Repeat vitamin B12 and iron studies are pending She is not symptomatic Observe closely Thrombocytopenia (HCC) This was relatively new over the past 2 years I suspect this is due to side effects of carbamazepine She is not symptomatic She can continue taking carbamazepine as this is helping her Vitamin B12 deficiency Repeat vitamin B12 level is pending We will proceed with B12 injection today I will call the patient with test results next week and if her B12 level improves, we can potentially space out her future appointment  No orders of the defined types were placed in this encounter.    Artis Delay, MD  INTERVAL HISTORY: she returns for follow-up for chronic pancytopenia and B12 deficiency She denies recent infection  PHYSICAL EXAMINATION: ECOG PERFORMANCE STATUS: 0 - Asymptomatic  Vitals:   03/10/23 1138  BP: (!) 159/76  Pulse: (!) 51  Resp: 18  Temp: 97.9 F (36.6 C)  SpO2: 100%   Filed Weights   03/10/23 1138  Weight: 122 lb 3.2 oz  (55.4 kg)    Relevant data reviewed during this visit included CBC, renal function.  B12 and iron studies are pending  SUMMARY OF ONCOLOGIC HISTORY: Oncology History   No history exists.

## 2023-03-15 ENCOUNTER — Ambulatory Visit: Payer: Medicare Other | Attending: Cardiovascular Disease | Admitting: Cardiovascular Disease

## 2023-03-15 ENCOUNTER — Telehealth: Payer: Self-pay | Admitting: Hematology and Oncology

## 2023-03-15 ENCOUNTER — Encounter: Payer: Self-pay | Admitting: Cardiovascular Disease

## 2023-03-15 VITALS — BP 152/74 | HR 50 | Ht 62.5 in | Wt 123.0 lb

## 2023-03-15 DIAGNOSIS — I447 Left bundle-branch block, unspecified: Secondary | ICD-10-CM

## 2023-03-15 DIAGNOSIS — E782 Mixed hyperlipidemia: Secondary | ICD-10-CM

## 2023-03-15 DIAGNOSIS — I2510 Atherosclerotic heart disease of native coronary artery without angina pectoris: Secondary | ICD-10-CM | POA: Diagnosis not present

## 2023-03-15 DIAGNOSIS — I10 Essential (primary) hypertension: Secondary | ICD-10-CM | POA: Diagnosis not present

## 2023-03-15 NOTE — Telephone Encounter (Signed)
 Patient is aware of scheduled appointment times/dates

## 2023-03-15 NOTE — Assessment & Plan Note (Signed)
 Chronic.

## 2023-03-15 NOTE — Patient Instructions (Signed)

## 2023-03-15 NOTE — Assessment & Plan Note (Signed)
 History of essential hypertension blood pressure measured today at 152/74.  She did not take her medications this morning however.  Her blood pressure at home usually measures in the 130/70 range.  She is on amlodipine, carvedilol, hydralazine, olmesartan and spironolactone.

## 2023-03-15 NOTE — Progress Notes (Signed)
 03/15/2023 Karen Saunders   1955-01-23  [id number]  Primary Physician Joaquim Nam, MD Primary Cardiologist: Runell Gess MD Nicholes Calamity, [place]  HPI:  Karen Saunders is a 68 y.o.   thin-appearing divorced African-American female mother of 1 son who is transitioning from Dr. Garnette Scheuermann to myself after his retirement.  I last saw her in the office 03/08/2022.  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  Her primary care provider is Dr. Crawford Givens.  Risk factors include treated hypertension and hyperlipidemia.  She is never had a heart attack or stroke.  She had coronary bypass grafting in 2002 and no problems with ischemic heart disease since.  She had a 2D echocardiogram performed 08/24/2021 that was essentially normal with grade 1 diastolic dysfunction.  Since I saw her a year ago she has been working with our Pharm.D.'s on hypertension/blood pressure control.  Her blood pressure at home is in the 130/70 range on her current medical regimen.  She denies chest pain or shortness of breath.   No outpatient medications have been marked as taking for the 03/15/23 encounter (Office Visit) with Runell Gess, MD.     Allergies  Allergen Reactions   Amoxicillin-Pot Clavulanate     REACTION: Rash on tongue   Azithromycin Other (See Comments)    diarrhea   Codeine Nausea Only   Delsym [Dextromethorphan]     GI upset.   Doxycycline     GI intolerance   Hydralazine     Nausea    Hydrochlorothiazide     Hyponatremia at 12.5mg  dose   Ibandronate Sodium     REACTION: GI side effects   Iohexol      Desc: HIVES,NASAL CONGESTION DURING A CARDIAC CATH. REQUIRES PRE-MEDS.    Raloxifene     REACTION: GI upset   Sulfonamide Derivatives     REACTION: itching   Venofer [Iron Sucrose] Other (See Comments) and Hypertension    Pt had rxn to Venofer see Hypersensitivity note from 05/24/2022.   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     Works at [employer]         Right Handed   Lives in a two story home   Social Drivers of Health   Financial  Resource Strain: Low Risk  (12/18/2022)   Overall Financial Resource Strain (CARDIA)    Difficulty of Paying Living Expenses: Not hard at all  Food Insecurity: No Food Insecurity (12/18/2022)   Hunger Vital Sign    Worried About Running Out of Food in the Last Year: Never true    Ran Out of Food in the Last Year: Never true  Transportation Needs: No Transportation Needs (12/18/2022)   PRAPARE - Administrator, Civil Service (Medical): No    Lack of Transportation (Non-Medical): No  Physical Activity: Insufficiently Active (12/18/2022)   Exercise Vital Sign    Days of Exercise per Week: 2 days    Minutes of Exercise per Session: 20 min  Stress: No Stress Concern Present (12/18/2022)   [employer] of Occupational Health - Occupational Stress Questionnaire    Feeling of Stress : Not at all  Social  Connections: Moderately Integrated (12/18/2022)   Social Connection and Isolation Panel [NHANES]    Frequency of Communication with Friends and Family: Three times a week    Frequency of Social Gatherings with Friends and Family: Once a week    Attends Religious Services: More than 4 times per year    Active Member of [employer] or Organizations: Yes    Attends Banker Meetings: 1 to 4 times per year    Marital Status: Divorced  Intimate Partner Violence: Not At Risk (12/05/2022)   Humiliation, Afraid, Rape, and Kick questionnaire    Fear of Current or Ex-Partner: No    Emotionally Abused: No    Physically Abused: No    Sexually Abused: No     Review of Systems: General: negative for chills, fever, night sweats or weight changes.  Cardiovascular: negative for chest pain, dyspnea on exertion, edema, orthopnea, palpitations, paroxysmal nocturnal dyspnea or shortness of breath Dermatological: negative for rash Respiratory: negative for cough or wheezing Urologic: negative for hematuria Abdominal: negative for nausea, vomiting, diarrhea, bright red blood per rectum, melena, or hematemesis Neurologic: negative for visual changes, syncope, or dizziness All other systems reviewed and are otherwise negative except as noted above.    Blood pressure (!) 152/74, pulse (!) 50, height 5' 2.5" (1.588 m), weight 123 lb (55.8 kg), SpO2 97%.  General appearance: alert and no distress Neck: no adenopathy, no carotid bruit, no JVD, supple, symmetrical, trachea midline, and thyroid not enlarged, symmetric, no tenderness/mass/nodules Lungs: clear to auscultation bilaterally Heart: regular rate and rhythm, S1, S2 normal, no murmur, click, rub or gallop Extremities: extremities normal, atraumatic, no cyanosis or edema Pulses: 2+ and symmetric Skin: Skin color, texture, turgor normal. No rashes or lesions Neurologic: Grossly normal  EKG EKG Interpretation Date/Time:  Wednesday March 15 2023  11:21:12 EST Ventricular Rate:  50 PR Interval:  154 QRS Duration:  150 QT Interval:  486 QTC Calculation: 443 R Axis:   -39  Text Interpretation: Sinus bradycardia Left axis deviation Left bundle branch block When compared with ECG of 23-[name]-2022 09:20, Vent. rate has decreased BY  38 BPM T wave amplitude has decreased in Anterior leads Confirmed by Nanetta Batty [phone]) on 03/15/2023 11:57:30 AM    ASSESSMENT AND PLAN:   HLD (hyperlipidemia) History of hyperlipidemia on high-dose statin therapy and Zetia with lipid profile performed 12/22/2022 revealing her cholesterol 150, LDL of 70 and HDL of 66.  Coronary atherosclerosis History of CAD status post coronary artery bypass grafting in 2002.  Cardiac cath performed August 2003 showed patent grafts.  She is asymptomatic.  Essential hypertension History of essential hypertension blood pressure measured today at 152/74.  She did not take her medications this morning however.  Her blood pressure at home usually measures in the 130/70 range.  She is on amlodipine, carvedilol, hydralazine, olmesartan and spironolactone.  Left bundle branch block Chronic     Runell Gess MD [facility], [facility] 03/15/2023 12:05 PM

## 2023-03-15 NOTE — Assessment & Plan Note (Addendum)
 History of hyperlipidemia on high-dose statin therapy and Zetia with lipid profile performed 12/22/2022 revealing her cholesterol 150, LDL of 70 and HDL of 66.

## 2023-03-15 NOTE — Assessment & Plan Note (Signed)
 History of CAD status post coronary artery bypass grafting in 2002.  Cardiac cath performed August 2003 showed patent grafts.  She is asymptomatic.

## 2023-03-23 ENCOUNTER — Telehealth: Payer: Self-pay | Admitting: Family Medicine

## 2023-03-23 NOTE — Telephone Encounter (Signed)
 Copied from CRM [id number]. Topic: Clinical - Medication Question >> Mar 23, 2023  9:32 AM Mackie Pai E wrote: Reason for CRM: Ave Filter from [facility] called regarding lab work that patient had done recently. Lequita Halt PA with [facility] wanted to make PCP aware of her Carbamazepine levels and wanted clarification if patient should stay on this medication. Ave Filter stated that other lab work levels came back low. Callback number for [facility] is [phone] and that is their direct triage line.

## 2023-03-23 NOTE — Telephone Encounter (Signed)
 Please update patient and [facility] clinic.  I thank all involved.    Her labs-hemoglobin, white count, platelets, creatinine-overall are similar to prior.  If her symptoms have improved with carbamazepine use, I would continue as is, especially since her carbamazepine level is normal.  She has follow-up scheduled for later this year with hematology.  Based on her labs, I think it makes sense to continue her medication as is and still keep the routine follow-up with hematology.  If she is having a clinical change or new symptoms in the meantime then please let us know.  Thanks.

## 2023-03-24 NOTE — Telephone Encounter (Signed)
 Called patient reviewed all information and repeated back to me. Will call if any questions.    Contacted [facility] clinic. Staff will forward information provided to [facility].

## 2023-03-31 ENCOUNTER — Telehealth: Payer: Self-pay | Admitting: Hematology and Oncology

## 2023-03-31 NOTE — Telephone Encounter (Signed)
 Per patient voicemail 03/31/2023 patient is aware of rescheduled appointment times/dates

## 2023-04-01 ENCOUNTER — Encounter: Payer: Self-pay | Admitting: Family Medicine

## 2023-04-05 NOTE — Telephone Encounter (Signed)
 See MyChart message.  Please check with Dot Lanes, PA-C about the request for follow-up/monitoring labs.  Please let me know what detail you get.  Thanks.  [address]  [place], [place] [address]  [phone] (Work)  [phone] (Fax)

## 2023-04-06 ENCOUNTER — Other Ambulatory Visit: Payer: Self-pay

## 2023-04-06 ENCOUNTER — Inpatient Hospital Stay: Attending: Hematology and Oncology

## 2023-04-06 DIAGNOSIS — E538 Deficiency of other specified B group vitamins: Secondary | ICD-10-CM | POA: Diagnosis present

## 2023-04-06 DIAGNOSIS — D539 Nutritional anemia, unspecified: Secondary | ICD-10-CM

## 2023-04-06 MED ORDER — METHYLPREDNISOLONE SODIUM SUCC 40 MG IJ SOLR
40.0000 mg | Freq: Once | INTRAMUSCULAR | Status: DC
Start: 2023-04-06 — End: 2023-04-06

## 2023-04-06 MED ORDER — FAMOTIDINE IN NACL 20-0.9 MG/50ML-% IV SOLN: 20.0000 mg | Freq: Once | INTRAVENOUS | Status: DC

## 2023-04-06 MED ORDER — ALBUTEROL SULFATE HFA 108 (90 BASE) MCG/ACT IN AERS: 2.0000 | INHALATION_SPRAY | Freq: Once | RESPIRATORY_TRACT | Status: DC | PRN

## 2023-04-06 MED ORDER — METHYLPREDNISOLONE SODIUM SUCC 125 MG IJ SOLR: 125.0000 mg | Freq: Once | INTRAMUSCULAR | Status: DC | PRN

## 2023-04-06 MED ORDER — DIPHENHYDRAMINE HCL 50 MG/ML IJ SOLN: 50.0000 mg | Freq: Once | INTRAMUSCULAR | Status: DC | PRN

## 2023-04-06 MED ORDER — ALTEPLASE 2 MG IJ SOLR: 2.0000 mg | Freq: Once | INTRAMUSCULAR | Status: DC | PRN

## 2023-04-06 MED ORDER — HEPARIN SOD (PORK) LOCK FLUSH 100 UNIT/ML IV SOLN: 500.0000 [IU] | Freq: Once | INTRAVENOUS | Status: DC | PRN

## 2023-04-06 MED ORDER — CYANOCOBALAMIN 1000 MCG/ML IJ SOLN
1000.0000 ug | Freq: Once | INTRAMUSCULAR | Status: AC
  Administered 2023-04-06: 1000 ug via INTRAMUSCULAR
  Filled 2023-04-06: qty 1

## 2023-04-06 MED ORDER — HEPARIN SOD (PORK) LOCK FLUSH 100 UNIT/ML IV SOLN: 250.0000 [IU] | Freq: Once | INTRAVENOUS | Status: DC | PRN

## 2023-04-06 MED ORDER — EPINEPHRINE 0.3 MG/0.3ML IJ SOAJ
0.3000 mg | Freq: Once | INTRAMUSCULAR | Status: DC | PRN
Start: 2023-04-06 — End: 2023-04-06

## 2023-04-06 MED ORDER — SODIUM CHLORIDE 0.9 % IV SOLN
510.0000 mg | Freq: Once | INTRAVENOUS | Status: DC
Start: 2023-04-06 — End: 2023-04-06

## 2023-04-06 MED ORDER — FAMOTIDINE IN NACL 20-0.9 MG/50ML-% IV SOLN
20.0000 mg | Freq: Once | INTRAVENOUS | Status: DC | PRN
Start: 2023-04-06 — End: 2023-04-06

## 2023-04-06 MED ORDER — CETIRIZINE HCL 10 MG/ML IV SOLN: 10.0000 mg | Freq: Once | INTRAVENOUS | Status: DC

## 2023-04-06 MED ORDER — SODIUM CHLORIDE 0.9% FLUSH: 3.0000 mL | Freq: Once | INTRAVENOUS | Status: DC | PRN

## 2023-04-06 MED ORDER — SODIUM CHLORIDE 0.9% FLUSH: 10.0000 mL | Freq: Once | INTRAVENOUS | Status: DC | PRN

## 2023-04-06 MED ORDER — SODIUM CHLORIDE 0.9 % IV SOLN: Freq: Once | INTRAVENOUS | Status: DC

## 2023-04-06 MED ORDER — LORATADINE 10 MG PO TABS: 10.0000 mg | ORAL_TABLET | Freq: Once | ORAL | Status: DC

## 2023-04-06 MED ORDER — SODIUM CHLORIDE 0.9 % IV SOLN: Freq: Once | INTRAVENOUS | Status: DC | PRN

## 2023-04-07 ENCOUNTER — Inpatient Hospital Stay: Attending: Hematology and Oncology

## 2023-04-09 NOTE — Telephone Encounter (Signed)
 See below re: mychart message and let me know what you hear from Dot Lanes.  Thanks.

## 2023-04-12 NOTE — Telephone Encounter (Signed)
 Reached out to Dr. Jarrett Ables office [phone] spoke with Misty Stanley who connected me to the nurse triage line. I left a message for a return call. Attempting to find out what labs if any did they want [place] to do so that the patient can continue to be prescribed the carbamazepine.

## 2023-04-12 NOTE — Telephone Encounter (Signed)
 Returned call to Karen Saunders who advised that its up to PCP to do labs that he thinks are appropriate They are just sending the patient back to [place] so that she can continue to take the Tegretol that we prescribed. Do you want me to schedule the patient to come in and be seen.

## 2023-04-12 NOTE — Telephone Encounter (Signed)
 Copied from CRM [id number]. Topic: General - Other >> Apr 12, 2023 11:38 AM Sim Boast F wrote: Reason for CRM: Dr. Sampson Goon office returned Karen Saunders's phone call, says patient doesn't need labs - they want patient to follow up with primary care to monitor the Tegretol. You can call back [phone] with any questions.

## 2023-04-13 NOTE — Telephone Encounter (Signed)
 Please talk to me about the situation.  Are they willing to continue prescribing her Tegretol?    She has had appropriate monitoring labs in the meantime.  She has had hematology evaluation.

## 2023-04-13 NOTE — Telephone Encounter (Signed)
 The office advised it is to be managed by you

## 2023-04-15 ENCOUNTER — Other Ambulatory Visit: Payer: Self-pay | Admitting: Cardiovascular Disease

## 2023-04-16 ENCOUNTER — Other Ambulatory Visit: Payer: Self-pay | Admitting: Family Medicine

## 2023-04-16 MED ORDER — CARBAMAZEPINE 200 MG PO TABS: ORAL_TABLET | ORAL | 1 refills | Status: DC

## 2023-04-17 ENCOUNTER — Telehealth: Payer: Self-pay | Admitting: Cardiovascular Disease

## 2023-04-17 NOTE — Telephone Encounter (Signed)
 Pt's medication was sent to pt's pharmacy as requested. Confirmation received.

## 2023-04-17 NOTE — Telephone Encounter (Signed)
 *  STAT* If patient is at the pharmacy, call can be transferred to refill team.   1. Which medications need to be refilled? (please list name of each medication and dose if known)   hydrALAZINE (APRESOLINE) 25 MG tablet     2. Would you like to learn more about the convenience, safety, & potential cost savings by using the [facility]? No      3. Are you open to using the [facility] (Type [facility]. ). No   4. Which pharmacy/location (including street and city if local pharmacy) is medication to be sent to?  [id number] - [place], [place] - [address]     5. Do they need a 30 day or 90 day supply? 90 days

## 2023-05-04 ENCOUNTER — Telehealth: Payer: Self-pay | Admitting: Hematology and Oncology

## 2023-05-04 NOTE — Telephone Encounter (Signed)
 The patient called to reschedule B12 injection due to work conflicts. The patient is aware of the changes made.

## 2023-05-05 ENCOUNTER — Inpatient Hospital Stay

## 2023-05-09 ENCOUNTER — Inpatient Hospital Stay: Attending: Hematology and Oncology

## 2023-05-09 DIAGNOSIS — E538 Deficiency of other specified B group vitamins: Secondary | ICD-10-CM | POA: Insufficient documentation

## 2023-05-09 DIAGNOSIS — D539 Nutritional anemia, unspecified: Secondary | ICD-10-CM

## 2023-05-09 MED ORDER — CYANOCOBALAMIN 1000 MCG/ML IJ SOLN
1000.0000 ug | Freq: Once | INTRAMUSCULAR | Status: AC
  Administered 2023-05-09: 1000 ug via INTRAMUSCULAR
  Filled 2023-05-09: qty 1

## 2023-05-15 ENCOUNTER — Ambulatory Visit (INDEPENDENT_AMBULATORY_CARE_PROVIDER_SITE_OTHER): Admitting: Podiatry

## 2023-05-15 DIAGNOSIS — M21612 Bunion of left foot: Secondary | ICD-10-CM | POA: Diagnosis not present

## 2023-05-15 DIAGNOSIS — M2012 Hallux valgus (acquired), left foot: Secondary | ICD-10-CM | POA: Diagnosis not present

## 2023-05-15 DIAGNOSIS — M25572 Pain in left ankle and joints of left foot: Secondary | ICD-10-CM

## 2023-05-15 MED ORDER — TRIAMCINOLONE ACETONIDE 10 MG/ML IJ SUSP
10.0000 mg | Freq: Once | INTRAMUSCULAR | Status: AC
  Administered 2023-05-15: 10 mg

## 2023-05-15 NOTE — Progress Notes (Signed)
 Chief Complaint  Patient presents with   Bunions    Left foot bunion, cort. Inj. given 4 weeks ago. No improvements. 8 pain. Non diabetic.    HPI: 68 y.o. female presents today for bunion pain.  She had an injection from Dr. Meriam Stamp several months ago but states that it did not provide any relief.  She does note that it caused some hypopigmentation of the skin proximal to the bunion.  She does wear sketcher shoes and likes shoes that she can slip in and not have to deal with laces.  When removing her insole it does appear that her toes are at the very tip of the shoe and there is a depression already starting where the bunion has been rubbing against the inside of the shoe.   Past Medical History:  Diagnosis Date   Anemia    with prev unremarkable heme eval ~2011   CAD (coronary artery disease)    Dr. Anastasia Balo with Cards   History of gastric ulcer    2005    Hyperlipidemia    Hypertension    IBS (irritable bowel syndrome)    Leukopenia    with prev unremarkable heme eval ~2011   Migraine    Unsp w/o intract w/o status migrainosus   Osteoporosis 11/18/2004   Shingles    Trigeminal neuralgia 03/05/2012    Past Surgical History:  Procedure Laterality Date   ABDOMINAL HYSTERECTOMY  2002   CORONARY ARTERY BYPASS GRAFT  2002   LUMBAR SPINE SURGERY     Stress cardiolite   09/11/2008   Probably normal with breast attenuation noted but no evidence of ischemia   TONSILLECTOMY  ~ 1965    Allergies  Allergen Reactions   Amoxicillin-Pot Clavulanate     REACTION: Rash on tongue   Azithromycin  Other (See Comments)    diarrhea   Codeine  Nausea Only   Delsym [Dextromethorphan]     GI upset.   Doxycycline      GI intolerance   Hydralazine      Nausea    Hydrochlorothiazide      Hyponatremia at 12.5mg  dose   Ibandronate Sodium     REACTION: GI side effects   Iohexol      Desc: HIVES,NASAL CONGESTION DURING A CARDIAC CATH. REQUIRES PRE-MEDS.    Raloxifene     REACTION: GI upset    Sulfonamide Derivatives     REACTION: itching   Venofer  [Iron  Sucrose] Other (See Comments) and Hypertension    Pt had rxn to Venofer  see Hypersensitivity note from 05/24/2022.   Zoledronic Acid     REACTION: unable to take this as she was intolerant of pre-tx calcium  and vitamin D    Iodinated Contrast Media Rash    Review of Systems  Musculoskeletal:  Positive for joint pain.      PHYSICAL EXAM:  General: The patient is alert and oriented x3 in no acute distress.  Dermatology: Skin is warm, dry and supple bilateral lower extremities. Interspaces are clear of maceration and debris.  No rashes noted.  There is hypopigmentation on the medial aspect of the left forefoot 1.5 cm proximal to the bunion "bump"  Vascular: Palpable pedal pulses bilaterally. Capillary refill within normal limits.  No appreciable edema.  No erythema or calor.  Neurological: Light touch sensation grossly intact bilateral feet.   Musculoskeletal Exam:  There is a bony medial prominence on the dorsomedial aspect of the 1st metatarsal head of the left foot.  There is pain on palpation of the "  bump" in this area.  Lateral deviation of the hallux at the MPJ level.  1st MPJ ROM is decreased.  No crepitus.  Hallux is tracking, not trackbound.  No pain with range of motion of the first MPJ  ASSESSMENT/PLAN OF CARE: 1. Pain in joint, ankle and foot, left   2. Bunion, left foot   3. Hav (hallux abducto valgus), left      Meds ordered this encounter  Medications   triamcinolone  acetonide (KENALOG ) 10 MG/ML injection 10 mg   Discussed patient's condition and possible etiologies today.  Discussed conservative treatment options with patient today, including shoe modification / arch supports, off-loading, cortisone injectione, NSAID topical / oral therapy, and toe splints and shields.  Briefly discussed surgical intervention if conservative options are not successful.    With the patient's verbal consent, a corticosteroid  injection was adminstered to the medial aspect of the left first MPJ.  This consisted of a mixture of 1% lidocaine  plain, 0.5% sensorcaine plain, and Kenalog -10 for a total of 1.25cc's administered.  Bandaid applied. Patient tolerated this well.     I recommended that the patient get a pair of the same sketcher shoes but I have size longer and a wider width.  This should be a more appropriate fit for the patient and decrease pressure on the bunion bump.  Gel toe spacer dispensed.  Also recommended over-the-counter Voltaren/diclofenac gel to be applied twice daily to the bunion area.  She needs to massage this in well.   Return if symptoms worsen or fail to improve.    Joe Murders, DPM, FACFAS [facility]     [address] [place], [place] [address]                Office [phone]  Fax [phone]

## 2023-06-02 ENCOUNTER — Ambulatory Visit

## 2023-06-06 ENCOUNTER — Ambulatory Visit

## 2023-06-07 ENCOUNTER — Inpatient Hospital Stay

## 2023-06-11 ENCOUNTER — Other Ambulatory Visit: Payer: Self-pay | Admitting: Family Medicine

## 2023-06-14 ENCOUNTER — Telehealth: Payer: Self-pay | Admitting: Hematology and Oncology

## 2023-06-30 ENCOUNTER — Ambulatory Visit

## 2023-07-02 ENCOUNTER — Other Ambulatory Visit: Payer: Self-pay | Admitting: Family Medicine

## 2023-07-06 ENCOUNTER — Inpatient Hospital Stay: Attending: Hematology and Oncology

## 2023-07-06 DIAGNOSIS — D539 Nutritional anemia, unspecified: Secondary | ICD-10-CM

## 2023-07-06 DIAGNOSIS — E538 Deficiency of other specified B group vitamins: Secondary | ICD-10-CM | POA: Insufficient documentation

## 2023-07-06 MED ORDER — CYANOCOBALAMIN 1000 MCG/ML IJ SOLN
1000.0000 ug | Freq: Once | INTRAMUSCULAR | Status: AC
  Administered 2023-07-06: 1000 ug via INTRAMUSCULAR
  Filled 2023-07-06: qty 1

## 2023-07-08 ENCOUNTER — Other Ambulatory Visit: Payer: Self-pay | Admitting: Student

## 2023-07-09 ENCOUNTER — Other Ambulatory Visit: Payer: Self-pay | Admitting: Cardiovascular Disease

## 2023-07-10 ENCOUNTER — Telehealth: Payer: Self-pay

## 2023-07-10 NOTE — Telephone Encounter (Signed)
 Returned her call and left a message to call the office back. She is complaining of should pain after b12 injection on 6/26. She has taken motrin. Left a message to try heat/ ice and she can alternate tylenol  with the motrin as needed.

## 2023-07-10 NOTE — Telephone Encounter (Signed)
 She called back. Pain started in right neck and back of her right shoulder on 6/27. She has been using a heating pad, applied Voltaren gel and has been taking motrin. The motrin helps for a little while and then the pain is back. The injection site looks good and has no redness/ looks normal. She will continue heating pad and alternate tylenol  with the motrin as needed. She is asking what Dr. Lonn recommends.

## 2023-07-11 ENCOUNTER — Encounter: Payer: Self-pay | Admitting: Hematology and Oncology

## 2023-07-11 NOTE — Telephone Encounter (Signed)
 Multiple joint pain is not caused by vitamin b12 inj I suggest she reach out to primary care doctor

## 2023-07-11 NOTE — Telephone Encounter (Signed)
 Called and left below message. Ask her to contact PCP to follow up on shoulder/ neck pain. Ask her to call the office for questions.

## 2023-07-27 ENCOUNTER — Inpatient Hospital Stay

## 2023-07-28 ENCOUNTER — Inpatient Hospital Stay: Attending: Hematology and Oncology

## 2023-07-31 ENCOUNTER — Encounter: Payer: Self-pay | Admitting: Family Medicine

## 2023-08-09 ENCOUNTER — Other Ambulatory Visit: Payer: Self-pay | Admitting: Family Medicine

## 2023-08-11 ENCOUNTER — Encounter: Payer: Self-pay | Admitting: Family Medicine

## 2023-08-11 ENCOUNTER — Ambulatory Visit: Admitting: Family Medicine

## 2023-08-11 ENCOUNTER — Telehealth: Payer: Self-pay | Admitting: *Deleted

## 2023-08-11 VITALS — BP 138/72 | HR 51 | Temp 97.8°F | Ht 62.5 in | Wt 113.4 lb

## 2023-08-11 DIAGNOSIS — R634 Abnormal weight loss: Secondary | ICD-10-CM | POA: Diagnosis not present

## 2023-08-11 DIAGNOSIS — E538 Deficiency of other specified B group vitamins: Secondary | ICD-10-CM

## 2023-08-11 DIAGNOSIS — Z5181 Encounter for therapeutic drug level monitoring: Secondary | ICD-10-CM

## 2023-08-11 LAB — CBC WITH DIFFERENTIAL/PLATELET
Basophils Absolute: 0 K/uL (ref 0.0–0.1)
Basophils Relative: 0.5 % (ref 0.0–3.0)
Eosinophils Absolute: 0 K/uL (ref 0.0–0.7)
Eosinophils Relative: 0.7 % (ref 0.0–5.0)
HCT: 29 % — ABNORMAL LOW (ref 36.0–46.0)
Hemoglobin: 9.9 g/dL — ABNORMAL LOW (ref 12.0–15.0)
Lymphocytes Relative: 52.9 % — ABNORMAL HIGH (ref 12.0–46.0)
Lymphs Abs: 1 K/uL (ref 0.7–4.0)
MCHC: 34 g/dL (ref 30.0–36.0)
MCV: 93.3 fl (ref 78.0–100.0)
Monocytes Absolute: 0.2 K/uL (ref 0.1–1.0)
Monocytes Relative: 11.2 % (ref 3.0–12.0)
Neutro Abs: 0.6 K/uL — ABNORMAL LOW (ref 1.4–7.7)
Neutrophils Relative %: 34.7 % — ABNORMAL LOW (ref 43.0–77.0)
Platelets: 123 K/uL — ABNORMAL LOW (ref 150.0–400.0)
RBC: 3.11 Mil/uL — ABNORMAL LOW (ref 3.87–5.11)
RDW: 13.4 % (ref 11.5–15.5)
WBC: 1.8 K/uL — CL (ref 4.0–10.5)

## 2023-08-11 LAB — URINALYSIS, ROUTINE W REFLEX MICROSCOPIC
Bilirubin Urine: NEGATIVE
Hgb urine dipstick: NEGATIVE
Ketones, ur: NEGATIVE
Leukocytes,Ua: NEGATIVE
Nitrite: NEGATIVE
Specific Gravity, Urine: <=1.005 — AB (ref 1.000–1.030)
Total Protein, Urine: NEGATIVE
Urine Glucose: NEGATIVE
Urobilinogen, UA: 0.2 (ref 0.0–1.0)
pH: 6 (ref 5.0–8.0)

## 2023-08-11 LAB — COMPREHENSIVE METABOLIC PANEL WITH GFR
ALT: 17 U/L (ref 0–35)
AST: 20 U/L (ref 0–37)
Albumin: 4.4 g/dL (ref 3.5–5.2)
Alkaline Phosphatase: 63 U/L (ref 39–117)
BUN: 19 mg/dL (ref 6–23)
CO2: 27 meq/L (ref 19–32)
Calcium: 9.2 mg/dL (ref 8.4–10.5)
Chloride: 108 meq/L (ref 96–112)
Creatinine, Ser: 1.09 mg/dL (ref 0.40–1.20)
GFR: 51.72 mL/min — ABNORMAL LOW (ref >60.00)
Glucose, Bld: 84 mg/dL (ref 70–99)
Potassium: 4.9 meq/L (ref 3.5–5.1)
Sodium: 141 meq/L (ref 135–145)
Total Bilirubin: 0.3 mg/dL (ref 0.2–1.2)
Total Protein: 6.4 g/dL (ref 6.0–8.3)

## 2023-08-11 LAB — LIPID PANEL
Cholesterol: 152 mg/dL (ref 0–200)
HDL: 70 mg/dL (ref >39.00)
LDL Cholesterol: 72 mg/dL (ref 0–99)
NonHDL: 82.26
Total CHOL/HDL Ratio: 2
Triglycerides: 52 mg/dL (ref 0.0–149.0)
VLDL: 10.4 mg/dL (ref 0.0–40.0)

## 2023-08-11 LAB — VITAMIN B12: Vitamin B-12: 293 pg/mL (ref 211–911)

## 2023-08-11 LAB — TSH: TSH: 2.21 u[IU]/mL (ref 0.35–5.50)

## 2023-08-11 NOTE — Progress Notes (Signed)
 Weight loss noted in the last month or two.  Down 10 lbs since last OV here.  No FCNAVD.  No blood in stools. No night sweats.  No CP.  Not SOB.  She has been working a lot of hours in different clinics.  She is caring for her mother, who has progressive dementia.  Her sibs are helping.    She isn't getting lightheaded.    She is on B12 replacement at baseline.    Discussed previous hematology evaluation and ongoing carbamazepine  use, which has been helpful for the patient.  No new med changes to explain weight loss.  Discussed previous chronic leukopenia.  Meds, vitals, and allergies reviewed.   ROS: Per HPI unless specifically indicated in ROS section   GEN: nad, alert and oriented HEENT: mucous membranes moist NECK: supple w/o LA CV: rrr. PULM: ctab, no inc wob ABD: soft, +bs EXT: no edema SKIN: Well-perfused.  31 minutes were devoted to patient care in this encounter (this includes time spent reviewing the patient's file/history, interviewing and examining the patient, counseling/reviewing plan with patient).

## 2023-08-11 NOTE — Telephone Encounter (Signed)
 Message from pharmacy:  REQUEST FOR 90 DAYS PRESCRIPTION.   Last filled:  07/03/23, #90

## 2023-08-11 NOTE — Telephone Encounter (Signed)
 This is stable, at baseline, see result note.

## 2023-08-11 NOTE — Telephone Encounter (Signed)
 Carmena with [facility] called with Critical results.  Pt's WBC is 1.8  Notified PCP

## 2023-08-11 NOTE — Patient Instructions (Signed)
 Go to the lab on the way out.   If you have mychart we'll likely use that to update you.    Take care.  Glad to see you. If you get lightheaded or notice other changes, then let me know.

## 2023-08-12 LAB — CARBAMAZEPINE LEVEL, TOTAL: Carbamazepine Lvl: 9.1 mg/L (ref 4.0–12.0)

## 2023-08-13 ENCOUNTER — Ambulatory Visit: Payer: Self-pay | Admitting: Family Medicine

## 2023-08-13 DIAGNOSIS — R634 Abnormal weight loss: Secondary | ICD-10-CM | POA: Insufficient documentation

## 2023-08-13 NOTE — Assessment & Plan Note (Signed)
 Of unclear source.  Discussed that she had been working long hours and caring for her mother.  Unclear how much that contributes.  Check labs for reversible causes.  I would not be surprised if she had leukopenia noted on CBC given her previous baseline.  She does not have obvious alarming symptoms otherwise such as night sweats or fevers.  At this point okay for outpatient follow-up.

## 2023-08-22 ENCOUNTER — Telehealth: Payer: Self-pay

## 2023-08-22 NOTE — Telephone Encounter (Signed)
 Copied from CRM #[id number]. Topic: General - Other >> Aug 18, 2023  1:11 PM Shereese L wrote: Reason for CRM: patient called in to return office call. Adv nurse on lunch and will call back when she returns >> Aug 22, 2023 10:22 AM Emylou G wrote: Relayed msg .. She said in regards to Dr. Cleatus.. she will go with whatever his choice is.. imaging now or later.. she doesn't have a preference either way

## 2023-08-22 NOTE — Telephone Encounter (Signed)
 Closing encounter. Notes added in results encounter.

## 2023-08-23 ENCOUNTER — Other Ambulatory Visit: Payer: Self-pay | Admitting: Family Medicine

## 2023-08-23 DIAGNOSIS — R634 Abnormal weight loss: Secondary | ICD-10-CM

## 2023-08-25 ENCOUNTER — Inpatient Hospital Stay

## 2023-08-25 ENCOUNTER — Encounter: Payer: Self-pay | Admitting: Hematology and Oncology

## 2023-08-25 ENCOUNTER — Inpatient Hospital Stay: Attending: Hematology and Oncology

## 2023-08-25 ENCOUNTER — Inpatient Hospital Stay (HOSPITAL_BASED_OUTPATIENT_CLINIC_OR_DEPARTMENT_OTHER): Attending: Hematology and Oncology | Admitting: Hematology and Oncology

## 2023-08-25 VITALS — BP 146/79 | HR 52 | Temp 98.2°F | Resp 18 | Wt 113.8 lb

## 2023-08-25 DIAGNOSIS — D61818 Other pancytopenia: Secondary | ICD-10-CM | POA: Insufficient documentation

## 2023-08-25 DIAGNOSIS — D539 Nutritional anemia, unspecified: Secondary | ICD-10-CM

## 2023-08-25 DIAGNOSIS — D696 Thrombocytopenia, unspecified: Secondary | ICD-10-CM

## 2023-08-25 DIAGNOSIS — D509 Iron deficiency anemia, unspecified: Secondary | ICD-10-CM | POA: Diagnosis not present

## 2023-08-25 DIAGNOSIS — D72819 Decreased white blood cell count, unspecified: Secondary | ICD-10-CM

## 2023-08-25 DIAGNOSIS — Z79899 Other long term (current) drug therapy: Secondary | ICD-10-CM

## 2023-08-25 DIAGNOSIS — I10 Essential (primary) hypertension: Secondary | ICD-10-CM

## 2023-08-25 DIAGNOSIS — E538 Deficiency of other specified B group vitamins: Secondary | ICD-10-CM

## 2023-08-25 LAB — CBC WITH DIFFERENTIAL (CANCER CENTER ONLY)
Abs Immature Granulocytes: 0 K/uL (ref 0.00–0.07)
Basophils Absolute: 0 K/uL (ref 0.0–0.1)
Basophils Relative: 1 %
Eosinophils Absolute: 0 K/uL (ref 0.0–0.5)
Eosinophils Relative: 1 %
HCT: 28.1 % — ABNORMAL LOW (ref 36.0–46.0)
Hemoglobin: 9.6 g/dL — ABNORMAL LOW (ref 12.0–15.0)
Immature Granulocytes: 0 %
Lymphocytes Relative: 52 %
Lymphs Abs: 0.9 K/uL (ref 0.7–4.0)
MCH: 31.7 pg (ref 26.0–34.0)
MCHC: 34.2 g/dL (ref 30.0–36.0)
MCV: 92.7 fL (ref 80.0–100.0)
Monocytes Absolute: 0.2 K/uL (ref 0.1–1.0)
Monocytes Relative: 12 %
Neutro Abs: 0.6 K/uL — ABNORMAL LOW (ref 1.7–7.7)
Neutrophils Relative %: 34 %
Platelet Count: 108 K/uL — ABNORMAL LOW (ref 150–400)
RBC: 3.03 MIL/uL — ABNORMAL LOW (ref 3.87–5.11)
RDW: 13.1 % (ref 11.5–15.5)
WBC Count: 1.7 K/uL — ABNORMAL LOW (ref 4.0–10.5)
nRBC: 0 % (ref 0.0–0.2)

## 2023-08-25 LAB — IRON AND IRON BINDING CAPACITY (CC-WL,HP ONLY)
Iron: 129 ug/dL (ref 28–170)
Saturation Ratios: 38 % — ABNORMAL HIGH (ref 10.4–31.8)
TIBC: 337 ug/dL (ref 250–450)
UIBC: 208 ug/dL (ref 148–442)

## 2023-08-25 LAB — FERRITIN: Ferritin: 222 ng/mL (ref 11–307)

## 2023-08-25 LAB — VITAMIN B12: Vitamin B-12: 293 pg/mL (ref 180–914)

## 2023-08-25 MED ORDER — CYANOCOBALAMIN 1000 MCG/ML IJ SOLN
1000.0000 ug | Freq: Once | INTRAMUSCULAR | Status: AC
  Administered 2023-08-25: 1000 ug via INTRAMUSCULAR
  Filled 2023-08-25: qty 1

## 2023-08-25 NOTE — Assessment & Plan Note (Addendum)
This was relatively new over the past 2 years I suspect this is due to side effects of carbamazepine She is not symptomatic She can continue taking carbamazepine as this is helping her

## 2023-08-25 NOTE — Progress Notes (Signed)
   [facility] OFFICE PROGRESS NOTE  Karen Arlyss RAMAN, MD  ASSESSMENT & PLAN:  Assessment & Plan Chronic leukopenia This could be constitutional  She is not symptomatic Observe only Thrombocytopenia (HCC) This was relatively new over the past 2 years I suspect this is due to side effects of carbamazepine  She is not symptomatic She can continue taking carbamazepine  as this is helping her Vitamin B12 deficiency Repeat vitamin B12 level is pending We will proceed with B12 injection today Recent B12 level done by her primary care doctor's office is borderline low and I recommend the patient to continue vitamin B12 supplement with injection monthly for the next 6 months Deficiency anemia Multifactorial, there is a component of iron  deficiency anemia in the past as well as anemia chronic illness Repeat vitamin B12 and iron  studies are pending She is not symptomatic Observe closely    Orders Placed This Encounter  Procedures   Ferritin    Standing Status:   Future    Expiration Date:   08/24/2024   Iron  and Iron  Binding Capacity ([facility] only)    Standing Status:   Future    Expiration Date:   08/24/2024   Vitamin B12    Standing Status:   Future    Expiration Date:   08/24/2024   CBC with Differential (Cancer Center Only)    Standing Status:   Future    Expiration Date:   08/24/2024   Basic Metabolic Panel - Cancer Center Only    Standing Status:   Future    Expiration Date:   08/24/2024    INTERVAL HISTORY: Patient returns for recurrent anemia Symptoms of anemia includes none We reviewed CBC and vitamin B12 results  SUMMARY OF HEMATOLOGIC HISTORY: Karen Saunders is here because of pancytopenia  She was found to have abnormal CBC from recent blood count She was referred her to see Dr. Layla in September 2011 for similar reasons I reviewed her electronic records dated back to 2010 She has chronic intermittent leukopenia for several years and had  extensive workup in the past.  I suspect the cause of leukopenia was constitutional The patient had intermittent anemia and was noted to have iron  deficiency based on abnormal iron  studies in the past.  She is not taking any iron  supplement Most recently, she was noted to have borderline intermittent thrombocytopenia From leukopenia standpoint, she denies chronic infections From anemia standpoint, she have no symptoms of anemia such as recent chest pain on exertion, shortness of breath on minimal exertion, pre-syncopal episodes, or palpitations. From thrombocytopenia standpoint, she denies any recent bleeding such as epistaxis, hematuria or hematochezia The patient denies over the counter NSAID ingestion. She is not on antiplatelets agents. Her last colonoscopy was on 08/11/21 which showed diverticulosis only She had no prior history or diagnosis of cancer. Her age appropriate screening programs are up-to-date. She denies any pica and eats a variety of diet. She never donated blood or received blood transfusion She received intravenous iron  infusion in May 2024 along with B12 injections   Lab Results  Component Value Date   VITAMINB12 293 08/11/2023   FERRITIN 170 03/10/2023   Vitals:   08/25/23 1200  BP: (!) 146/79  Pulse: (!) 52  Resp: 18  Temp: 98.2 F (36.8 C)  SpO2: 100%

## 2023-08-25 NOTE — Assessment & Plan Note (Addendum)
 Multifactorial, there is a component of iron deficiency anemia in the past as well as anemia chronic illness Repeat vitamin B12 and iron studies are pending She is not symptomatic Observe closely

## 2023-08-25 NOTE — Assessment & Plan Note (Addendum)
 Repeat vitamin B12 level is pending We will proceed with B12 injection today Recent B12 level done by her primary care doctor's office is borderline low and I recommend the patient to continue vitamin B12 supplement with injection monthly for the next 6 months

## 2023-08-25 NOTE — Assessment & Plan Note (Addendum)
 This could be constitutional  She is not symptomatic Observe only

## 2023-08-30 ENCOUNTER — Ambulatory Visit
Admission: RE | Admit: 2023-08-30 | Discharge: 2023-08-30 | Disposition: A | Discharge status: Home / Self Care | Source: Ambulatory Visit | Attending: Family Medicine | Admitting: Family Medicine

## 2023-08-30 ENCOUNTER — Ambulatory Visit: Payer: Self-pay | Admitting: Family Medicine

## 2023-08-30 DIAGNOSIS — R634 Abnormal weight loss: Secondary | ICD-10-CM

## 2023-09-22 ENCOUNTER — Inpatient Hospital Stay

## 2023-10-02 ENCOUNTER — Other Ambulatory Visit: Payer: Self-pay | Admitting: Cardiovascular Disease

## 2023-10-11 ENCOUNTER — Other Ambulatory Visit: Payer: Self-pay | Admitting: Family Medicine

## 2023-10-23 ENCOUNTER — Inpatient Hospital Stay

## 2023-10-26 ENCOUNTER — Inpatient Hospital Stay: Attending: Hematology and Oncology

## 2023-10-26 DIAGNOSIS — E538 Deficiency of other specified B group vitamins: Secondary | ICD-10-CM | POA: Diagnosis present

## 2023-10-26 DIAGNOSIS — D539 Nutritional anemia, unspecified: Secondary | ICD-10-CM

## 2023-10-26 MED ORDER — CYANOCOBALAMIN 1000 MCG/ML IJ SOLN
1000.0000 ug | Freq: Once | INTRAMUSCULAR | Status: AC
  Administered 2023-10-26: 1000 ug via INTRAMUSCULAR
  Filled 2023-10-26: qty 1

## 2023-11-14 ENCOUNTER — Other Ambulatory Visit (HOSPITAL_COMMUNITY): Payer: Self-pay | Admitting: Gastroenterology

## 2023-11-14 DIAGNOSIS — R197 Diarrhea, unspecified: Secondary | ICD-10-CM

## 2023-11-14 DIAGNOSIS — R634 Abnormal weight loss: Secondary | ICD-10-CM

## 2023-11-14 DIAGNOSIS — R1013 Epigastric pain: Secondary | ICD-10-CM

## 2023-11-14 DIAGNOSIS — K559 Vascular disorder of intestine, unspecified: Secondary | ICD-10-CM

## 2023-11-21 ENCOUNTER — Other Ambulatory Visit: Payer: Self-pay | Admitting: Student

## 2023-11-23 ENCOUNTER — Inpatient Hospital Stay

## 2023-11-23 MED ORDER — AMLODIPINE BESYLATE 10 MG PO TABS: 10.0000 mg | ORAL_TABLET | Freq: Every day | ORAL | 1 refills | Status: AC

## 2023-11-24 ENCOUNTER — Ambulatory Visit (HOSPITAL_COMMUNITY)
Admission: RE | Admit: 2023-11-24 | Discharge: 2023-11-24 | Disposition: A | Discharge status: Home / Self Care | Source: Ambulatory Visit | Attending: Gastroenterology | Admitting: Gastroenterology

## 2023-11-24 ENCOUNTER — Inpatient Hospital Stay: Attending: Hematology and Oncology

## 2023-11-24 ENCOUNTER — Other Ambulatory Visit: Payer: Self-pay

## 2023-11-24 DIAGNOSIS — K559 Vascular disorder of intestine, unspecified: Secondary | ICD-10-CM

## 2023-11-24 DIAGNOSIS — R197 Diarrhea, unspecified: Secondary | ICD-10-CM

## 2023-11-24 DIAGNOSIS — R634 Abnormal weight loss: Secondary | ICD-10-CM

## 2023-11-24 DIAGNOSIS — R1013 Epigastric pain: Secondary | ICD-10-CM

## 2023-11-24 MED ORDER — DIPHENHYDRAMINE HCL 50 MG PO TABS: 50.0000 mg | ORAL_TABLET | Freq: Once | ORAL | 0 refills | Status: DC

## 2023-11-24 MED ORDER — PREDNISONE 50 MG PO TABS: ORAL_TABLET | ORAL | 0 refills | Status: DC

## 2023-11-28 ENCOUNTER — Ambulatory Visit (HOSPITAL_COMMUNITY)
Admission: RE | Admit: 2023-11-28 | Discharge: 2023-11-28 | Disposition: A | Discharge status: Home / Self Care | Source: Ambulatory Visit | Attending: Gastroenterology | Admitting: Gastroenterology

## 2023-11-28 DIAGNOSIS — R197 Diarrhea, unspecified: Secondary | ICD-10-CM | POA: Insufficient documentation

## 2023-11-28 DIAGNOSIS — R634 Abnormal weight loss: Secondary | ICD-10-CM | POA: Diagnosis present

## 2023-11-28 DIAGNOSIS — R1013 Epigastric pain: Secondary | ICD-10-CM | POA: Insufficient documentation

## 2023-11-28 DIAGNOSIS — K559 Vascular disorder of intestine, unspecified: Secondary | ICD-10-CM | POA: Diagnosis present

## 2023-11-28 MED ORDER — SODIUM CHLORIDE (PF) 0.9 % IJ SOLN
INTRAMUSCULAR | Status: AC
Start: 2023-11-28 — End: 2023-11-28
  Filled 2023-11-28: qty 50

## 2023-11-28 MED ORDER — IOHEXOL 350 MG/ML SOLN
80.0000 mL | Freq: Once | INTRAVENOUS | Status: AC | PRN
  Administered 2023-11-28: 80 mL via INTRAVENOUS

## 2023-12-01 ENCOUNTER — Inpatient Hospital Stay: Attending: Hematology and Oncology

## 2023-12-01 DIAGNOSIS — E538 Deficiency of other specified B group vitamins: Secondary | ICD-10-CM | POA: Diagnosis present

## 2023-12-01 DIAGNOSIS — D539 Nutritional anemia, unspecified: Secondary | ICD-10-CM

## 2023-12-01 MED ORDER — CYANOCOBALAMIN 1000 MCG/ML IJ SOLN
1000.0000 ug | Freq: Once | INTRAMUSCULAR | Status: AC
  Administered 2023-12-01: 1000 ug via INTRAMUSCULAR
  Filled 2023-12-01: qty 1

## 2023-12-04 ENCOUNTER — Telehealth: Payer: Self-pay | Admitting: Cardiovascular Disease

## 2023-12-04 MED ORDER — OLMESARTAN MEDOXOMIL 40 MG PO TABS: 40.0000 mg | ORAL_TABLET | Freq: Every day | ORAL | 1 refills | Status: AC

## 2023-12-04 NOTE — Telephone Encounter (Signed)
*  STAT* If patient is at the pharmacy, call can be transferred to refill team.   1. Which medications need to be refilled? (please list name of each medication and dose if known)   olmesartan  (BENICAR ) 40 MG tablet     2. Would you like to learn more about the convenience, safety, & potential cost savings by using the [facility]? No    3. Are you open to using the [facility] (Type [facility]. No    4. Which pharmacy/location (including street and city if local pharmacy) is medication to be sent to? [id number] - [place], [place] - [address]     5. Do they need a 30 day or 90 day supply? 90 day   Pt has 2 pills left.

## 2023-12-04 NOTE — Telephone Encounter (Signed)
 Pt's medication was sent to pt's pharmacy as requested. Confirmation received.

## 2023-12-06 ENCOUNTER — Ambulatory Visit: Payer: Medicare Other

## 2023-12-06 VITALS — Ht 62.5 in | Wt 113.0 lb

## 2023-12-06 DIAGNOSIS — Z0000 Encounter for general adult medical examination without abnormal findings: Secondary | ICD-10-CM | POA: Diagnosis not present

## 2023-12-06 NOTE — Patient Instructions (Addendum)
 Ms. Rockers,  Thank you for taking the time for your Medicare Wellness Visit. I appreciate your continued commitment to your health goals. Please review the care plan we discussed, and feel free to reach out if I can assist you further.  Please note that Annual Wellness Visits do not include a physical exam. Some assessments may be limited, especially if the visit was conducted virtually. If needed, we may recommend an in-person follow-up with your provider.  Ongoing Care Seeing your primary care provider every 3 to 6 months helps us  monitor your health and provide consistent, personalized care.   Referrals If a referral was made during today's visit and you haven't received any updates within two weeks, please contact the referred provider directly to check on the status.  Recommended Screenings:  Health Maintenance  Topic Date Due   Zoster (Shingles) Vaccine (1 of 2) Never done   DTaP/Tdap/Td vaccine (2 - Tdap) 01/10/2018   COVID-19 Vaccine (4 - 2025-26 season) 09/11/2023   Flu Shot  04/09/2024*   Breast Cancer Screening  09/12/2024   Medicare Annual Wellness Visit  12/05/2024   Colon Cancer Screening  08/12/2031   Pneumococcal Vaccine for age over 35  Completed   Osteoporosis screening with Bone Density Scan  Completed   Hepatitis C Screening  Completed   Meningitis B Vaccine  Aged Out  *Topic was postponed. The date shown is not the original due date.       12/02/2023    8:22 AM  Advanced Directives  Does Patient Have a Medical Advance Directive? Yes  Type of Advance Directive Healthcare Power of Attorney  Copy of Healthcare Power of Attorney in Chart? No - copy requested    Vision: Annual vision screenings are recommended for early detection of glaucoma, cataracts, and diabetic retinopathy. These exams can also reveal signs of chronic conditions such as diabetes and high blood pressure.  Dental: Annual dental screenings help detect early signs of oral cancer, gum disease,  and other conditions linked to overall health, including heart disease and diabetes.

## 2023-12-06 NOTE — Progress Notes (Addendum)
 Please attest and cosign this visit due to patients primary care provider not being in the office at the time the visit was completed.    Chief Complaint  Patient presents with   Medicare Wellness     Subjective:   Karen Saunders is a 68 y.o. female who presents for a Medicare Annual Wellness Visit.  Allergies (verified) Amoxicillin-pot clavulanate, Azithromycin , Codeine , Delsym [dextromethorphan], Doxycycline , Hydralazine , Hydrochlorothiazide , Ibandronate sodium, Iohexol , Raloxifene, Sulfonamide derivatives, Venofer  [iron  sucrose], Zoledronic acid, and Iodinated contrast media   History: Past Medical History:  Diagnosis Date   Allergy    Anemia    with prev unremarkable heme eval ~2011   CAD (coronary artery disease)    Dr. Blanca with Cards   CHF (congestive heart failure) (HCC)    GERD (gastroesophageal reflux disease)    History of gastric ulcer    2005    Hyperlipidemia    Hypertension    IBS (irritable bowel syndrome)    Leukopenia    with prev unremarkable heme eval ~2011   Migraine    Unsp w/o intract w/o status migrainosus   Osteoporosis 11/18/2004   Shingles    Trigeminal neuralgia 03/05/2012   Past Surgical History:  Procedure Laterality Date   ABDOMINAL HYSTERECTOMY  2002   CORONARY ARTERY BYPASS GRAFT  2002   LUMBAR SPINE SURGERY     Stress cardiolite   09/11/2008   Probably normal with breast attenuation noted but no evidence of ischemia   TONSILLECTOMY  ~ 1965   TUBAL LIGATION  1986   Family History  Problem Relation Age of Onset   Arthritis Mother    Diabetes Mother    Hypertension Mother    Dementia Mother    Stroke Father    Heart disease Father        MI   Diabetes Father    Hypertension Father    Diabetes Sister    Colon cancer Neg Hx    Breast cancer Neg Hx    Social History   Occupational History   Occupation: MEDICAID SPEC    Employer: [employer]   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 [employer]   Tobacco Use   Smoking status: Never   Smokeless tobacco: Never  Vaping Use   Vaping status: Never Used  Substance and Sexual Activity   Alcohol use: No    Alcohol/week: 0.0 standard drinks of alcohol   Drug use: No   Sexual activity: Not on file   Tobacco Counseling Counseling given: Not Answered  SDOH Screenings   Food Insecurity: No Food Insecurity (12/02/2023)  Housing: Unknown (12/02/2023)  Transportation Needs: No Transportation Needs (12/02/2023)  Utilities: Not At Risk (12/06/2023)  Depression (PHQ2-9): Low Risk  (12/06/2023)  Financial Resource Strain: Low Risk  (12/02/2023)  Physical Activity: Sufficiently Active (12/02/2023)  Social Connections: Moderately Integrated (12/02/2023)  Stress: No Stress Concern Present (12/02/2023)  Tobacco Use: Low Risk  (12/06/2023)  Health Literacy: Adequate Health Literacy (12/06/2023)   See flowsheets for full screening details  Depression Screen PHQ 2 & 9 Depression Scale- Over the past 2 weeks, how often have you been bothered by any of the following problems? Little interest or pleasure in doing things: 0 Feeling down, depressed, or hopeless (PHQ Adolescent also includes...irritable): 0 PHQ-2 Total Score: 0 Trouble falling or staying asleep, or sleeping too much: 0 Feeling tired or having little energy: 0 Poor appetite or overeating (PHQ Adolescent also includes...weight loss): 1 Feeling bad about yourself - or  that you are a failure or have let yourself or your family down: 0 Trouble concentrating on things, such as reading the newspaper or watching television (PHQ Adolescent also includes...like school work): 0 Moving or speaking so slowly that other people could have noticed. Or the opposite - being so fidgety or restless that you have been moving around a lot more than usual: 0 Thoughts that you would be better off dead, or of hurting yourself in some way: 0 PHQ-9 Total Score: 1 If you checked off any problems, how  difficult have these problems made it for you to do your work, take care of things at home, or get along with other people?: Not difficult at all     Goals Addressed             This Visit's Progress    Patient Stated       I would like to gain some weight       Visit info / Clinical Intake: Medicare Wellness Visit Type:: Subsequent Annual Wellness Visit Persons participating in visit:: patient Medicare Wellness Visit Mode:: Telephone If telephone:: video declined Because this visit was a virtual/telehealth visit:: unable to obtan vitals due to lack of equipment If Telephone or Video please confirm:: I discussed the limitations of evaluation and management by telemedicine Patient Location:: home Provider Location:: home Information given by:: patient Interpreter Needed?: No Pre-visit prep was completed: yes AWV questionnaire completed by patient prior to visit?: yes Date:: 12/02/23 Living arrangements:: (!) lives alone Patient's Overall Health Status Rating: very good Typical amount of pain: none Does pain affect daily life?: no Are you currently prescribed opioids?: no  Dietary Habits and Nutritional Risks How many meals a day?: 2 Eats fruit and vegetables daily?: yes Most meals are obtained by: preparing own meals; eating out In the last 2 weeks, have you had any of the following?: none Diabetic:: no  Functional Status Activities of Daily Living (to include ambulation/medication): (Patient-Rptd) Independent Ambulation: (Patient-Rptd) Independent Medication Administration: Independent Home Management: (Patient-Rptd) Independent Manage your own finances?: yes Primary transportation is: driving Concerns about vision?: no *vision screening is required for WTM* Concerns about hearing?: no  Fall Screening Falls in the past year?: (Patient-Rptd) 0 Number of falls in past year: (Patient-Rptd) 0 Was there an injury with Fall?: (Patient-Rptd) 0 Fall Risk Category  Calculator: (Patient-Rptd) 0 Patient Fall Risk Level: (Patient-Rptd) Low Fall Risk  Fall Risk Patient at Risk for Falls Due to: No Fall Risks Fall risk Follow up: Education provided; Falls prevention discussed  Home and Transportation Safety: All rugs have non-skid backing?: N/A, no rugs All stairs or steps have railings?: yes Grab bars in the bathtub or shower?: (!) no Have non-skid surface in bathtub or shower?: yes Good home lighting?: yes Regular seat belt use?: yes Hospital stays in the last year:: no  Cognitive Assessment Difficulty concentrating, remembering, or making decisions? : no Will 6CIT or Mini Cog be Completed: yes What year is it?: 0 points What month is it?: 0 points Give patient an address phrase to remember (5 components): [address] [facility]  About what time is it?: 0 points Count backwards from 20 to 1: 0 points Say the months of the year in reverse: 0 points Repeat the address phrase from earlier: 0 points 6 CIT Score: 0 points  Advance Directives (For Healthcare) Does Patient Have a Medical Advance Directive?: Yes Type of Advance Directive: Healthcare Power of Attorney Copy of Healthcare Power of Attorney in Chart?: No -  copy requested  Reviewed/Updated  Reviewed/Updated: Reviewed All (Medical, Surgical, Family, Medications, Allergies, Care Teams, Patient Goals)       Objective:    Today's Vitals   12/06/23 0924  Weight: 113 lb (51.3 kg)  Height: 5' 2.5 (1.588 m)   Body mass index is 20.34 kg/m.  Current Medications (verified) Outpatient Encounter Medications as of 12/06/2023  Medication Sig   amLODipine  (NORVASC ) 10 MG tablet Take 1 tablet (10 mg total) by mouth daily.   aspirin  EC 81 MG tablet Take 1 tablet (81 mg total) by mouth daily. Swallow whole.   carbamazepine  (TEGRETOL ) 200 MG tablet TAKE 2 TABLETS IN THE MORNING AND 2 TABLETS AT NIGHT   carvedilol  (COREG ) 6.25 MG tablet TAKE 1 TABLET BY MOUTH TWICE A DAY    dexlansoprazole (DEXILANT) 60 MG capsule Take 60 mg by mouth daily.   ezetimibe  (ZETIA ) 10 MG tablet TAKE 1 TABLET BY MOUTH EVERY DAY   gabapentin  (NEURONTIN ) 100 MG capsule Take 1 capsule (100 mg total) by mouth daily as needed.   hydrALAZINE  (APRESOLINE ) 25 MG tablet TAKE 1 TABLET BY MOUTH THREE TIMES A DAY   levocetirizine (XYZAL ) 5 MG tablet TAKE 1 TABLET (5 MG TOTAL) BY MOUTH DAILY.   olmesartan  (BENICAR ) 40 MG tablet Take 1 tablet (40 mg total) by mouth daily.   rosuvastatin  (CRESTOR ) 40 MG tablet TAKE 1 TABLET BY MOUTH EVERY DAY   spironolactone  (ALDACTONE ) 50 MG tablet TAKE 1 TABLET BY MOUTH EVERY DAY   traZODone  (DESYREL ) 50 MG tablet TAKE 1.5-2 TABLETS (75-100 MG TOTAL) BY MOUTH AT BEDTIME AS NEEDED FOR SLEEP.   diphenhydrAMINE  (BENADRYL ) 50 MG tablet Take 1 tablet (50 mg total) by mouth once for 1 dose. Take this medication once 1 hr prior to contrast administration (bring with you to appointment) (Patient not taking: Reported on 12/06/2023)   predniSONE  (DELTASONE ) 50 MG tablet Take one tablet 13 hours prior to procedure, 7 hrs, and 1 hr prior (bring last tablet with you). You will be called with exact times for the medication to be taken once CT has an exact time. (Patient not taking: Reported on 12/06/2023)   No facility-administered encounter medications on file as of 12/06/2023.   Hearing/Vision screen Vision Screening - Comments:: UTD w/visits to [facility] Immunizations and Health Maintenance Health Maintenance  Topic Date Due   Zoster Vaccines- Shingrix (1 of 2) Never done   DTaP/Tdap/Td (2 - Tdap) 01/10/2018   COVID-19 Vaccine (4 - 2025-26 season) 09/11/2023   Influenza Vaccine  04/09/2024 (Originally 08/11/2023)   Mammogram  09/12/2024   Medicare Annual Wellness (AWV)  12/05/2024   Colonoscopy  08/12/2031   Pneumococcal Vaccine: 50+ Years  Completed   Bone Density Scan  Completed   Hepatitis C Screening  Completed   Meningococcal B Vaccine  Aged Out         Assessment/Plan:  This is a routine wellness examination for Rasheen.  Patient Care Team: Cleatus Arlyss RAMAN, MD as PCP - General Kristie Lamprey, MD as Consulting Physician (Gastroenterology) Blanca Elsie RAMAN, MD as Consulting Physician (Cardiology) Patel, Donika K, DO as Consulting Physician (Neurology) Court Dorn PARAS, MD as Consulting Physician (Cardiology) Brien Garnette Pepper, MD as Referring Physician (Otolaryngology) [facility] Estelle Service, MD as Consulting Physician (Obstetrics and Gynecology)  I have personally reviewed and noted the following in the patient's chart:   Medical and social history Use of alcohol, tobacco or illicit drugs  Current medications and supplements including opioid prescriptions.  Functional ability and status Nutritional status Physical activity Advanced directives List of other physicians Hospitalizations, surgeries, and ER visits in previous 12 months Vitals Screenings to include cognitive, depression, and falls Referrals and appointments  No orders of the defined types were placed in this encounter.  In addition, I have reviewed and discussed with patient certain preventive protocols, quality metrics, and best practice recommendations. A written personalized care plan for preventive services as well as general preventive health recommendations were provided to patient.   Erminio LITTIE Saris, LPN   [id number]   Return in 1 year (on 12/05/2024).  After Visit Summary: (MyChart) Due to this being a telephonic visit, the after visit summary with patients personalized plan was offered to patient via MyChart   Nurse Notes: Pt has no concerns or questions. AWV/CPE made simultaneously for one/next year

## 2023-12-11 ENCOUNTER — Telehealth: Payer: Self-pay

## 2023-12-11 NOTE — Telephone Encounter (Signed)
 Called and moved injection appt to 12/19 at 1:15 pm. Canceled January appt for injection and told to keep appts as scheduled in February. She verbalized understanding.

## 2023-12-15 ENCOUNTER — Encounter: Payer: Self-pay | Admitting: Hematology and Oncology

## 2023-12-17 ENCOUNTER — Other Ambulatory Visit: Payer: Self-pay | Admitting: Family Medicine

## 2023-12-17 DIAGNOSIS — E538 Deficiency of other specified B group vitamins: Secondary | ICD-10-CM

## 2023-12-17 DIAGNOSIS — I10 Essential (primary) hypertension: Secondary | ICD-10-CM

## 2023-12-17 DIAGNOSIS — M810 Age-related osteoporosis without current pathological fracture: Secondary | ICD-10-CM

## 2023-12-17 DIAGNOSIS — Z5181 Encounter for therapeutic drug level monitoring: Secondary | ICD-10-CM

## 2023-12-18 ENCOUNTER — Other Ambulatory Visit (INDEPENDENT_AMBULATORY_CARE_PROVIDER_SITE_OTHER)

## 2023-12-18 ENCOUNTER — Other Ambulatory Visit

## 2023-12-18 ENCOUNTER — Telehealth: Payer: Self-pay

## 2023-12-18 ENCOUNTER — Ambulatory Visit: Admitting: Family Medicine

## 2023-12-18 DIAGNOSIS — Z5181 Encounter for therapeutic drug level monitoring: Secondary | ICD-10-CM | POA: Diagnosis not present

## 2023-12-18 DIAGNOSIS — I10 Essential (primary) hypertension: Secondary | ICD-10-CM

## 2023-12-18 DIAGNOSIS — M810 Age-related osteoporosis without current pathological fracture: Secondary | ICD-10-CM | POA: Diagnosis not present

## 2023-12-18 DIAGNOSIS — E538 Deficiency of other specified B group vitamins: Secondary | ICD-10-CM

## 2023-12-18 LAB — CBC WITH DIFFERENTIAL/PLATELET
Basophils Absolute: 0 K/uL (ref 0.0–0.1)
Basophils Relative: 0.4 % (ref 0.0–3.0)
Eosinophils Absolute: 0 K/uL (ref 0.0–0.7)
Eosinophils Relative: 0.9 % (ref 0.0–5.0)
HCT: 29.2 % — ABNORMAL LOW (ref 36.0–46.0)
Hemoglobin: 10.1 g/dL — ABNORMAL LOW (ref 12.0–15.0)
Lymphocytes Relative: 60.4 % — ABNORMAL HIGH (ref 12.0–46.0)
Lymphs Abs: 0.9 K/uL (ref 0.7–4.0)
MCHC: 34.4 g/dL (ref 30.0–36.0)
MCV: 94.5 fl (ref 78.0–100.0)
Monocytes Absolute: 0.2 K/uL (ref 0.1–1.0)
Monocytes Relative: 14.1 % — ABNORMAL HIGH (ref 3.0–12.0)
Neutro Abs: 0.3 K/uL — ABNORMAL LOW (ref 1.4–7.7)
Neutrophils Relative %: 24.2 % — ABNORMAL LOW (ref 43.0–77.0)
Platelets: 109 K/uL — ABNORMAL LOW (ref 150.0–400.0)
RBC: 3.09 Mil/uL — ABNORMAL LOW (ref 3.87–5.11)
RDW: 13.1 % (ref 11.5–15.5)
WBC: 1.4 K/uL — CL (ref 4.0–10.5)

## 2023-12-18 LAB — COMPREHENSIVE METABOLIC PANEL WITH GFR
ALT: 16 U/L (ref 0–35)
AST: 22 U/L (ref 0–37)
Albumin: 4.5 g/dL (ref 3.5–5.2)
Alkaline Phosphatase: 71 U/L (ref 39–117)
BUN: 17 mg/dL (ref 6–23)
CO2: 28 meq/L (ref 19–32)
Calcium: 9.5 mg/dL (ref 8.4–10.5)
Chloride: 107 meq/L (ref 96–112)
Creatinine, Ser: 1.19 mg/dL (ref 0.40–1.20)
GFR: 46.44 mL/min — ABNORMAL LOW (ref >60.00)
Glucose, Bld: 85 mg/dL (ref 70–99)
Potassium: 4.5 meq/L (ref 3.5–5.1)
Sodium: 143 meq/L (ref 135–145)
Total Bilirubin: 0.3 mg/dL (ref 0.2–1.2)
Total Protein: 6.5 g/dL (ref 6.0–8.3)

## 2023-12-18 LAB — VITAMIN B12: Vitamin B-12: 376 pg/mL (ref 211–911)

## 2023-12-18 LAB — VITAMIN D 25 HYDROXY (VIT D DEFICIENCY, FRACTURES): VITD: 60.85 ng/mL (ref 30.00–100.00)

## 2023-12-18 NOTE — Telephone Encounter (Signed)
 Noted. Thanks.

## 2023-12-18 NOTE — Telephone Encounter (Signed)
 This is her baseline.  Can d/w pt at OV.    Please update patient.  Verify that she isn't having fevers or other new concerns.  Thanks.

## 2023-12-18 NOTE — Telephone Encounter (Signed)
  CRITICAL VALUE: wbc 1.4   RECEIVER (on-site recipient of call): Karen Saunders Y Infiniti Hoefling, CMA   DATE & TIME NOTIFIED: 2:27 PM 12/18/23   MESSENGER (representative from lab): Ernst   MD NOTIFIED: Dr. Arlyss Solian  TIME OF NOTIFICATION:2:27 PM  RESPONSE: placed in red book and sending message to PCP high priority. Will verbally make him or his CMA aware of message.

## 2023-12-18 NOTE — Telephone Encounter (Signed)
 Spoke with patient. No fevers and no new concerns.

## 2023-12-19 LAB — CARBAMAZEPINE LEVEL, TOTAL: Carbamazepine Lvl: 10.5 mg/L (ref 4.0–12.0)

## 2023-12-20 ENCOUNTER — Ambulatory Visit: Payer: Self-pay | Admitting: Family Medicine

## 2023-12-22 ENCOUNTER — Inpatient Hospital Stay

## 2023-12-26 ENCOUNTER — Ambulatory Visit: Admitting: Family Medicine

## 2023-12-26 ENCOUNTER — Encounter: Payer: Self-pay | Admitting: Family Medicine

## 2023-12-26 VITALS — BP 132/72 | HR 51 | Temp 98.4°F | Ht 61.5 in | Wt 112.1 lb

## 2023-12-26 MED ORDER — TRAZODONE HCL 50 MG PO TABS: 75.0000 mg | ORAL_TABLET | Freq: Every evening | ORAL | 3 refills | Status: AC | PRN

## 2023-12-26 MED ORDER — GABAPENTIN 100 MG PO CAPS: 100.0000 mg | ORAL_CAPSULE | Freq: Every day | ORAL | 3 refills | Status: AC | PRN

## 2023-12-26 NOTE — Progress Notes (Unsigned)
 Flu 2025 Shingles discussed with patient PNA updated 2022 Tetanus 2024 COVID-vaccine previously done Colonoscopy 2023 Mammogram per gynecology.  [facility] 2024.  She can schedule, d/w pt.   Bone density test deferred 2025 Living will d/w pt.  Would have her sister Montel and her son Ozell equally designated if patient were incapacitated.   CT 2025 d/w pt.  We talked about weight loss.  She is going to check with outside clinic about weight gain options.    She is still helping care for her mother who has dementia.  I thanked her for her effort.     Her prev thumb pain is better.    Hypertension:               Using medication without problems or lightheadedness: yes Chest pain with exertion:no Edema:no Short of breath:no   She doesn't have new HA or neuro sx o/w.     She had hematology eval 2025, d/w pt.  She is on B12 replacement per hematology.  Labs d/w pt.  No bleeding.  No recurrent infections.    Ear sx improved with tegretol , d/w pt.  No ear clicking.  No hearing changes noted by patient.  Whisper hearing intact.    Elevated Cholesterol: Using medications without problems: yes Muscle aches: no Diet compliance: yes Exercise: d/w pt.  Walking for exercise.     Gabapentin  helped with cough.  Used prn at night.    Meds, vitals, and allergies reviewed.    ROS: Per HPI unless specifically indicated in ROS section    GEN: nad, alert and oriented HEENT: NCAT NECK: supple w/o LA CV: rrr. PULM: ctab, no inc wob ABD: soft, +bs EXT: no edema SKIN: well perfused.

## 2023-12-26 NOTE — Patient Instructions (Addendum)
 Please call about a mammogram and update me as needed.  Take care.  Glad to see you. Let me know if you have any more weight loss.

## 2023-12-27 NOTE — Assessment & Plan Note (Signed)
 Gabapentin  helped with cough.  Used prn at night.  Continue as is.  Lungs are clear.

## 2023-12-27 NOTE — Assessment & Plan Note (Signed)
°  Ear sx improved with tegretol , d/w pt.  No ear clicking.  No hearing changes noted by patient.  Whisper hearing intact.  Would continue as is.

## 2023-12-27 NOTE — Assessment & Plan Note (Signed)
 Controlled.  Continue amlodipine  carvedilol  hydralazine  olmesartan  spironolactone .

## 2023-12-27 NOTE — Assessment & Plan Note (Signed)
 Flu 2025 Shingles discussed with patient PNA updated 2022 Tetanus 2024 COVID-vaccine previously done Colonoscopy 2023 Mammogram per gynecology.  [facility] 20[facility].  She can schedule, d/w pt.   Bone density test deferred 2025 Living will d/w pt.  Would have her sister Montel and her son Ozell equally designated if patient were incapacitated.

## 2023-12-27 NOTE — Assessment & Plan Note (Signed)
 She had hematology eval 2025, d/w pt.  She is on B12 replacement per hematology.  Labs d/w pt.  No bleeding.  No recurrent infections.  Would continue as is.

## 2023-12-27 NOTE — Assessment & Plan Note (Signed)
 Continue Crestor

## 2023-12-27 NOTE — Assessment & Plan Note (Signed)
Living will d/w pt.  Would have her sister Karen Saunders and her son Karen Saunders equally designated if patient were incapacitated.

## 2023-12-29 ENCOUNTER — Inpatient Hospital Stay: Attending: Hematology and Oncology

## 2023-12-29 DIAGNOSIS — E538 Deficiency of other specified B group vitamins: Secondary | ICD-10-CM | POA: Diagnosis present

## 2023-12-29 DIAGNOSIS — D539 Nutritional anemia, unspecified: Secondary | ICD-10-CM

## 2023-12-29 MED ORDER — CYANOCOBALAMIN 1000 MCG/ML IJ SOLN
1000.0000 ug | Freq: Once | INTRAMUSCULAR | Status: AC
  Administered 2023-12-29: 1000 ug via INTRAMUSCULAR
  Filled 2023-12-29: qty 1

## 2024-01-22 ENCOUNTER — Inpatient Hospital Stay

## 2024-02-22 ENCOUNTER — Inpatient Hospital Stay: Admitting: Hematology and Oncology

## 2024-02-22 ENCOUNTER — Inpatient Hospital Stay: Attending: Hematology and Oncology

## 2024-02-22 ENCOUNTER — Inpatient Hospital Stay

## 2024-03-18 ENCOUNTER — Ambulatory Visit: Admitting: Cardiovascular Disease

## 2024-12-27 ENCOUNTER — Encounter: Admitting: Family Medicine

## 2024-12-27 ENCOUNTER — Ambulatory Visit
